# Patient Record
Sex: Male | Born: 1960 | Race: Black or African American | Hispanic: No | Marital: Married | Smoking: Current every day smoker
Health system: Southern US, Community
[De-identification: ages and names within clinical notes are randomized; demographics above are authoritative.]

## PROBLEM LIST (undated history)

## (undated) DIAGNOSIS — E785 Hyperlipidemia, unspecified: Secondary | ICD-10-CM

## (undated) DIAGNOSIS — I10 Essential (primary) hypertension: Secondary | ICD-10-CM

## (undated) DIAGNOSIS — Z720 Tobacco use: Secondary | ICD-10-CM

## (undated) HISTORY — PX: STERIOD INJECTION: SHX5046

---

## 2002-07-16 ENCOUNTER — Emergency Department (HOSPITAL_COMMUNITY): Admission: EM | Admit: 2002-07-16 | Discharge: 2002-07-16 | Payer: Self-pay | Admitting: Emergency Medicine

## 2004-09-12 ENCOUNTER — Ambulatory Visit: Payer: Self-pay | Admitting: Internal Medicine

## 2004-12-08 ENCOUNTER — Ambulatory Visit: Payer: Self-pay | Admitting: Unknown Physician Specialty

## 2005-04-13 ENCOUNTER — Ambulatory Visit: Payer: Self-pay | Admitting: Unknown Physician Specialty

## 2005-06-24 ENCOUNTER — Emergency Department (HOSPITAL_COMMUNITY): Admission: EM | Admit: 2005-06-24 | Discharge: 2005-06-24 | Payer: Self-pay | Admitting: Emergency Medicine

## 2005-08-19 ENCOUNTER — Emergency Department (HOSPITAL_COMMUNITY): Admission: EM | Admit: 2005-08-19 | Discharge: 2005-08-19 | Payer: Self-pay | Admitting: Emergency Medicine

## 2007-02-08 ENCOUNTER — Emergency Department (HOSPITAL_COMMUNITY): Admission: EM | Admit: 2007-02-08 | Discharge: 2007-02-08 | Payer: Self-pay | Admitting: Emergency Medicine

## 2008-11-09 ENCOUNTER — Emergency Department (HOSPITAL_COMMUNITY): Admission: EM | Admit: 2008-11-09 | Discharge: 2008-11-09 | Payer: Self-pay | Admitting: Emergency Medicine

## 2011-01-29 LAB — COMPREHENSIVE METABOLIC PANEL
ALT: 18 U/L (ref 0–53)
Albumin: 4 g/dL (ref 3.5–5.2)
Alkaline Phosphatase: 57 U/L (ref 39–117)
Calcium: 9.3 mg/dL (ref 8.4–10.5)
Potassium: 3.6 mEq/L (ref 3.5–5.1)
Sodium: 137 mEq/L (ref 135–145)
Total Protein: 7.1 g/dL (ref 6.0–8.3)

## 2011-01-29 LAB — PROTIME-INR: INR: 1 (ref 0.00–1.49)

## 2011-01-29 LAB — TROPONIN I
Troponin I: 0.01 ng/mL (ref 0.00–0.06)
Troponin I: <0.01 ng/mL (ref 0.00–0.06)

## 2011-01-29 LAB — POCT CARDIAC MARKERS
Myoglobin, poc: 88.6 ng/mL (ref 12–200)
Troponin i, poc: <0.05 ng/mL (ref 0.00–0.09)

## 2011-01-29 LAB — URINALYSIS, ROUTINE W REFLEX MICROSCOPIC
Glucose, UA: NEGATIVE mg/dL
Ketones, ur: NEGATIVE mg/dL
Nitrite: NEGATIVE
Specific Gravity, Urine: 1.023 (ref 1.005–1.030)
pH: 6 (ref 5.0–8.0)

## 2011-01-29 LAB — DIFFERENTIAL
Basophils Relative: 0 % (ref 0–1)
Eosinophils Absolute: 0.1 10*3/uL (ref 0.0–0.7)
Lymphs Abs: 2.7 10*3/uL (ref 0.7–4.0)
Monocytes Absolute: 1.1 10*3/uL — ABNORMAL HIGH (ref 0.1–1.0)
Monocytes Relative: 8 % (ref 3–12)
Neutro Abs: 10.4 10*3/uL — ABNORMAL HIGH (ref 1.7–7.7)

## 2011-01-29 LAB — RAPID URINE DRUG SCREEN, HOSP PERFORMED
Barbiturates: NOT DETECTED
Benzodiazepines: NOT DETECTED
Cocaine: NOT DETECTED
Opiates: POSITIVE — AB

## 2011-01-29 LAB — CK TOTAL AND CKMB (NOT AT ARMC)
CK, MB: 1.5 ng/mL (ref 0.3–4.0)
Relative Index: 1.5 (ref 0.0–2.5)

## 2011-01-29 LAB — ETHANOL: Alcohol, Ethyl (B): <5 mg/dL (ref 0–10)

## 2011-01-29 LAB — CBC
Hemoglobin: 15 g/dL (ref 13.0–17.0)
RBC: 5.24 MIL/uL (ref 4.22–5.81)
WBC: 14.4 10*3/uL — ABNORMAL HIGH (ref 4.0–10.5)

## 2012-08-03 ENCOUNTER — Encounter (HOSPITAL_COMMUNITY): Payer: Self-pay | Admitting: *Deleted

## 2012-08-03 ENCOUNTER — Emergency Department (HOSPITAL_COMMUNITY): Payer: Medicare Other

## 2012-08-03 ENCOUNTER — Emergency Department (HOSPITAL_COMMUNITY)
Admission: EM | Admit: 2012-08-03 | Discharge: 2012-08-03 | Disposition: A | Discharge status: Home / Self Care | Payer: Medicare Other | Attending: Emergency Medicine | Admitting: Emergency Medicine

## 2012-08-03 DIAGNOSIS — IMO0002 Reserved for concepts with insufficient information to code with codable children: Secondary | ICD-10-CM | POA: Insufficient documentation

## 2012-08-03 DIAGNOSIS — S300XXA Contusion of lower back and pelvis, initial encounter: Secondary | ICD-10-CM

## 2012-08-03 DIAGNOSIS — F17200 Nicotine dependence, unspecified, uncomplicated: Secondary | ICD-10-CM | POA: Insufficient documentation

## 2012-08-03 DIAGNOSIS — S20229A Contusion of unspecified back wall of thorax, initial encounter: Secondary | ICD-10-CM | POA: Insufficient documentation

## 2012-08-03 DIAGNOSIS — S20219A Contusion of unspecified front wall of thorax, initial encounter: Secondary | ICD-10-CM

## 2012-08-03 MED ORDER — IBUPROFEN 800 MG PO TABS: 800.0000 mg | ORAL_TABLET | Freq: Three times a day (TID) | ORAL | Status: DC | PRN

## 2012-08-03 MED ORDER — HYDROCODONE-ACETAMINOPHEN 5-325 MG PO TABS
1.0000 | ORAL_TABLET | Freq: Four times a day (QID) | ORAL | Status: DC | PRN
Start: 2012-08-03 — End: 2013-06-15
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
## 2012-08-17 NOTE — ED Provider Notes (Signed)
Medical screening examination/treatment/procedure(s) were performed by non-physician practitioner and as supervising physician I was immediately available for consultation/collaboration.  Hurman Horn, MD 08/17/12 [phone]

## 2013-06-15 ENCOUNTER — Emergency Department (HOSPITAL_COMMUNITY)
Admission: EM | Admit: 2013-06-15 | Discharge: 2013-06-15 | Disposition: A | Discharge status: Home / Self Care | Payer: Medicare Other | Attending: Emergency Medicine | Admitting: Emergency Medicine

## 2013-06-15 ENCOUNTER — Encounter (HOSPITAL_COMMUNITY): Payer: Self-pay | Admitting: Emergency Medicine

## 2013-06-15 DIAGNOSIS — Z79899 Other long term (current) drug therapy: Secondary | ICD-10-CM | POA: Insufficient documentation

## 2013-06-15 DIAGNOSIS — F17200 Nicotine dependence, unspecified, uncomplicated: Secondary | ICD-10-CM | POA: Insufficient documentation

## 2013-06-15 DIAGNOSIS — M549 Dorsalgia, unspecified: Secondary | ICD-10-CM

## 2013-06-15 DIAGNOSIS — M545 Low back pain, unspecified: Secondary | ICD-10-CM | POA: Insufficient documentation

## 2013-06-15 DIAGNOSIS — I10 Essential (primary) hypertension: Secondary | ICD-10-CM | POA: Insufficient documentation

## 2013-06-15 HISTORY — DX: Essential (primary) hypertension: I10

## 2013-06-15 MED ORDER — TRAMADOL HCL 50 MG PO TABS: 50.0000 mg | ORAL_TABLET | Freq: Four times a day (QID) | ORAL | Status: DC | PRN

## 2013-06-15 NOTE — ED Notes (Signed)
Pt complains of "back pain x 2 weeks after doing yard work" Pt denies fall or trauma to area.

## 2013-06-15 NOTE — ED Provider Notes (Signed)
Medical screening examination/treatment/procedure(s) were performed by non-physician practitioner and as supervising physician I was immediately available for consultation/collaboration.   Junius Argyle, MD 06/15/13 [phone]

## 2013-06-15 NOTE — ED Provider Notes (Signed)
CSN: [id number]     Arrival date & time 06/15/13  1016 History   First MD Initiated Contact with Patient 06/15/13 1033     Chief Complaint  Patient presents with  . Back Pain   (Consider location/radiation/quality/duration/timing/severity/associated sxs/prior Treatment) Patient is a 52 y.o. male presenting with back pain. The history is provided by the patient and medical records.  Back Pain  Patient is to the ED for left lower back pain x2 weeks after doing yard work.  States he was picking up heavy tree limbs and thinks he may have "tweaked" his back the wrong way.  States pain radiates down his left leg. No injury, trauma, or falls.  Denies any numbness or paresthesias of lower extremities. No loss of bowel or bladder function. Patient does have a history of prior work-related low back injury.  Has taken OTC BC powder without relief.  No chest pain, SOB, or abdominal pain.  VS stable on arrival.  Past Medical History  Diagnosis Date  . Hypertension    History reviewed. No pertinent past surgical history. No family history on file. History  Substance Use Topics  . Smoking status: Current Every Day Smoker -- 1.00 packs/day for 20 years    Types: Cigarettes  . Smokeless tobacco: Not on file  . Alcohol Use: No    Review of Systems  Musculoskeletal: Positive for back pain.  All other systems reviewed and are negative.    Allergies  Review of patient's allergies indicates no known allergies.  Home Medications   Current Outpatient Rx  Name  Route  Sig  Dispense  Refill  . Aspirin-Salicylamide-Caffeine (ARTHRITIS STRENGTH BC POWDER PO)   Oral   Take 1 packet by mouth every 6 (six) hours as needed (For back pain.).         Marland Kitchen atorvastatin (LIPITOR) 20 MG tablet   Oral   Take 20 mg by mouth daily.         . hydrochlorothiazide (HYDRODIURIL) 12.5 MG tablet   Oral   Take 12.5 mg by mouth daily.          BP 121/82  Pulse 77  Temp(Src) 98.8 F (37.1 C) (Oral)  Resp  18  SpO2 100%  Physical Exam  Nursing note and vitals reviewed. Constitutional: He is oriented to person, place, and time. He appears well-developed and well-nourished. No distress.  HENT:  Head: Normocephalic and atraumatic.  Eyes: Conjunctivae and EOM are normal.  Neck: Normal range of motion. Neck supple.  Cardiovascular: Normal rate, regular rhythm and normal heart sounds.   Pulmonary/Chest: Effort normal and breath sounds normal. No respiratory distress. He has no wheezes.  Abdominal: Soft. Bowel sounds are normal.  Musculoskeletal: Normal range of motion. He exhibits no edema.       Lumbar back: He exhibits tenderness, pain and spasm. He exhibits normal range of motion, no bony tenderness, no swelling, no edema, no deformity and no laceration.       Back:  Left lower back pain; no midline TTP, step off, gross deformity, or visible signs of trauma; full ROM maintained; strong distal pulses, sensation intact, normal gait  Neurological: He is alert and oriented to person, place, and time.  Skin: Skin is warm and dry. He is not diaphoretic.  Psychiatric: He has a normal mood and affect.    ED Course  Procedures (including critical care time) Labs Review Labs Reviewed - No data to display Imaging Review No results found.  MDM   1.  Back pain    Atraumatic, low back pain. No concern for dissection or cauda equina.  Rx tramadol.  FU with [facility] if no improvement in the next few days. Discussed plan with pt, they agreed.  Return precautions advised.    Garlon Hatchet, PA-C 06/15/13 1217

## 2013-10-06 ENCOUNTER — Other Ambulatory Visit: Payer: Self-pay | Admitting: Orthopedic Surgery

## 2013-10-06 ENCOUNTER — Encounter (HOSPITAL_BASED_OUTPATIENT_CLINIC_OR_DEPARTMENT_OTHER): Payer: Medicare Other | Admitting: Anesthesiology

## 2013-10-06 ENCOUNTER — Encounter (HOSPITAL_BASED_OUTPATIENT_CLINIC_OR_DEPARTMENT_OTHER): Payer: Self-pay | Admitting: *Deleted

## 2013-10-06 ENCOUNTER — Ambulatory Visit (HOSPITAL_BASED_OUTPATIENT_CLINIC_OR_DEPARTMENT_OTHER)
Admission: RE | Admit: 2013-10-06 | Discharge: 2013-10-06 | Disposition: A | Discharge status: Home / Self Care | Payer: Medicare Other | Source: Ambulatory Visit | Attending: Orthopedic Surgery | Admitting: Orthopedic Surgery

## 2013-10-06 ENCOUNTER — Encounter (HOSPITAL_BASED_OUTPATIENT_CLINIC_OR_DEPARTMENT_OTHER)
Admission: RE | Disposition: A | Discharge status: Home / Self Care | Payer: Self-pay | Source: Ambulatory Visit | Attending: Orthopedic Surgery

## 2013-10-06 ENCOUNTER — Ambulatory Visit (HOSPITAL_BASED_OUTPATIENT_CLINIC_OR_DEPARTMENT_OTHER): Payer: Medicare Other | Admitting: Anesthesiology

## 2013-10-06 DIAGNOSIS — L03019 Cellulitis of unspecified finger: Secondary | ICD-10-CM | POA: Insufficient documentation

## 2013-10-06 DIAGNOSIS — L02519 Cutaneous abscess of unspecified hand: Secondary | ICD-10-CM | POA: Insufficient documentation

## 2013-10-06 DIAGNOSIS — F17200 Nicotine dependence, unspecified, uncomplicated: Secondary | ICD-10-CM | POA: Insufficient documentation

## 2013-10-06 DIAGNOSIS — I10 Essential (primary) hypertension: Secondary | ICD-10-CM | POA: Insufficient documentation

## 2013-10-06 HISTORY — PX: INCISION AND DRAINAGE: SHX5863

## 2013-10-06 HISTORY — DX: Hyperlipidemia, unspecified: E78.5

## 2013-10-06 SURGERY — INCISION AND DRAINAGE
Anesthesia: General | Site: Finger | Laterality: Left

## 2013-10-06 MED ORDER — MIDAZOLAM HCL 5 MG/5ML IJ SOLN
INTRAMUSCULAR | Status: DC | PRN
  Administered 2013-10-06: 2 mg via INTRAVENOUS

## 2013-10-06 MED ORDER — FENTANYL CITRATE 0.05 MG/ML IJ SOLN
INTRAMUSCULAR | Status: AC
  Filled 2013-10-06: qty 6

## 2013-10-06 MED ORDER — OXYCODONE HCL 5 MG PO TABS: 5.0000 mg | ORAL_TABLET | Freq: Once | ORAL | Status: DC | PRN

## 2013-10-06 MED ORDER — FENTANYL CITRATE 0.05 MG/ML IJ SOLN
INTRAMUSCULAR | Status: DC | PRN
  Administered 2013-10-06: 100 ug via INTRAVENOUS

## 2013-10-06 MED ORDER — BUPIVACAINE HCL (PF) 0.25 % IJ SOLN
INTRAMUSCULAR | Status: DC | PRN
  Administered 2013-10-06: 10 mL

## 2013-10-06 MED ORDER — TETANUS-DIPHTHERIA TOXOIDS TD 5-2 LFU IM INJ
0.5000 mL | INJECTION | Freq: Once | INTRAMUSCULAR | Status: AC
  Administered 2013-10-06: 0.5 mL via INTRAMUSCULAR

## 2013-10-06 MED ORDER — PROPOFOL 10 MG/ML IV BOLUS
INTRAVENOUS | Status: DC | PRN
  Administered 2013-10-06: 200 mg via INTRAVENOUS

## 2013-10-06 MED ORDER — MIDAZOLAM HCL 2 MG/2ML IJ SOLN: 1.0000 mg | INTRAMUSCULAR | Status: DC | PRN

## 2013-10-06 MED ORDER — SULFAMETHOXAZOLE-TRIMETHOPRIM 800-160 MG PO TABS: 1.0000 | ORAL_TABLET | Freq: Two times a day (BID) | ORAL | Status: DC

## 2013-10-06 MED ORDER — CEFAZOLIN SODIUM-DEXTROSE 2-3 GM-% IV SOLR
INTRAVENOUS | Status: AC
  Filled 2013-10-06: qty 50

## 2013-10-06 MED ORDER — LACTATED RINGERS IV SOLN
INTRAVENOUS | Status: DC
  Administered 2013-10-06: 12:00:00 via INTRAVENOUS

## 2013-10-06 MED ORDER — ONDANSETRON HCL 4 MG/2ML IJ SOLN: 4.0000 mg | Freq: Once | INTRAMUSCULAR | Status: DC | PRN

## 2013-10-06 MED ORDER — HYDROCODONE-ACETAMINOPHEN 5-325 MG PO TABS: ORAL_TABLET | ORAL | Status: DC

## 2013-10-06 MED ORDER — ONDANSETRON HCL 4 MG/2ML IJ SOLN
INTRAMUSCULAR | Status: DC | PRN
  Administered 2013-10-06: 4 mg via INTRAVENOUS

## 2013-10-06 MED ORDER — LIDOCAINE HCL (CARDIAC) 20 MG/ML IV SOLN
INTRAVENOUS | Status: DC | PRN
  Administered 2013-10-06: 100 mg via INTRAVENOUS

## 2013-10-06 MED ORDER — FENTANYL CITRATE 0.05 MG/ML IJ SOLN: 50.0000 ug | INTRAMUSCULAR | Status: DC | PRN

## 2013-10-06 MED ORDER — CEFAZOLIN SODIUM-DEXTROSE 2-3 GM-% IV SOLR
INTRAVENOUS | Status: DC | PRN
  Administered 2013-10-06: 2 g via INTRAVENOUS

## 2013-10-06 MED ORDER — BUPIVACAINE HCL (PF) 0.25 % IJ SOLN
INTRAMUSCULAR | Status: AC
  Filled 2013-10-06: qty 30

## 2013-10-06 MED ORDER — MEPERIDINE HCL 25 MG/ML IJ SOLN: 6.2500 mg | INTRAMUSCULAR | Status: DC | PRN

## 2013-10-06 MED ORDER — MIDAZOLAM HCL 2 MG/2ML IJ SOLN
INTRAMUSCULAR | Status: AC
  Filled 2013-10-06: qty 2

## 2013-10-06 MED ORDER — DEXAMETHASONE SODIUM PHOSPHATE 4 MG/ML IJ SOLN
INTRAMUSCULAR | Status: DC | PRN
  Administered 2013-10-06: 10 mg via INTRAVENOUS

## 2013-10-06 MED ORDER — OXYCODONE HCL 5 MG/5ML PO SOLN: 5.0000 mg | Freq: Once | ORAL | Status: DC | PRN

## 2013-10-06 MED ORDER — HYDROMORPHONE HCL PF 1 MG/ML IJ SOLN: 0.2500 mg | INTRAMUSCULAR | Status: DC | PRN

## 2013-10-06 SURGICAL SUPPLY — 50 items
BAG DECANTER FOR FLEXI CONT (MISCELLANEOUS) IMPLANT
BANDAGE ELASTIC 3 VELCRO ST LF (GAUZE/BANDAGES/DRESSINGS) IMPLANT
BANDAGE GAUZE STRT 1 STR LF (GAUZE/BANDAGES/DRESSINGS) IMPLANT
BLADE MINI RND TIP GREEN BEAV (BLADE) IMPLANT
BLADE SURG 15 STRL LF DISP TIS (BLADE) ×2 IMPLANT
BLADE SURG 15 STRL SS (BLADE) ×2
BNDG CMPR 9X4 STRL LF SNTH (GAUZE/BANDAGES/DRESSINGS) ×1
BNDG CMPR MD 5X2 ELC HKLP STRL (GAUZE/BANDAGES/DRESSINGS)
BNDG COHESIVE 1X5 TAN STRL LF (GAUZE/BANDAGES/DRESSINGS) ×1 IMPLANT
BNDG ELASTIC 2 VLCR STRL LF (GAUZE/BANDAGES/DRESSINGS) IMPLANT
BNDG ESMARK 4X9 LF (GAUZE/BANDAGES/DRESSINGS) ×1 IMPLANT
BNDG GAUZE ELAST 4 BULKY (GAUZE/BANDAGES/DRESSINGS) IMPLANT
CHLORAPREP W/TINT 26ML (MISCELLANEOUS) ×2 IMPLANT
CORDS BIPOLAR (ELECTRODE) ×2 IMPLANT
COVER MAYO STAND STRL (DRAPES) ×2 IMPLANT
COVER TABLE BACK 60X90 (DRAPES) ×2 IMPLANT
CUFF TOURNIQUET SINGLE 18IN (TOURNIQUET CUFF) ×2 IMPLANT
DRAPE EXTREMITY T 121X128X90 (DRAPE) ×2 IMPLANT
DRAPE SURG 17X23 STRL (DRAPES) ×2 IMPLANT
GAUZE PACKING IODOFORM 1/4X15 (GAUZE/BANDAGES/DRESSINGS) ×1 IMPLANT
GAUZE XEROFORM 1X8 LF (GAUZE/BANDAGES/DRESSINGS) ×2 IMPLANT
GLOVE BIO SURGEON STRL SZ7.5 (GLOVE) ×2 IMPLANT
GLOVE BIOGEL PI IND STRL 8 (GLOVE) ×1 IMPLANT
GLOVE BIOGEL PI INDICATOR 8 (GLOVE) ×1
GLOVE ECLIPSE 6.5 STRL STRAW (GLOVE) ×1 IMPLANT
GOWN BRE IMP PREV XXLGXLNG (GOWN DISPOSABLE) ×2 IMPLANT
GOWN PREVENTION PLUS XLARGE (GOWN DISPOSABLE) ×2 IMPLANT
LOOP VESSEL MAXI BLUE (MISCELLANEOUS) IMPLANT
NDL HYPO 25X1 1.5 SAFETY (NEEDLE) IMPLANT
NEEDLE HYPO 25X1 1.5 SAFETY (NEEDLE) ×2 IMPLANT
NS IRRIG 1000ML POUR BTL (IV SOLUTION) ×2 IMPLANT
PACK BASIN DAY SURGERY FS (CUSTOM PROCEDURE TRAY) ×2 IMPLANT
PAD CAST 3X4 CTTN HI CHSV (CAST SUPPLIES) IMPLANT
PADDING CAST ABS 4INX4YD NS (CAST SUPPLIES)
PADDING CAST ABS COTTON 4X4 ST (CAST SUPPLIES) ×1 IMPLANT
PADDING CAST COTTON 3X4 STRL (CAST SUPPLIES)
SPLINT FNGR PLAIN END 5/8X3.25 (CAST SUPPLIES) IMPLANT
SPLINT PLASTALUME 3 1/4 (CAST SUPPLIES) ×2
SPLINT PLASTER CAST XFAST 3X15 (CAST SUPPLIES) IMPLANT
SPLINT PLASTER XTRA FASTSET 3X (CAST SUPPLIES)
SPONGE GAUZE 4X4 12PLY (GAUZE/BANDAGES/DRESSINGS) ×2 IMPLANT
STOCKINETTE 4X48 STRL (DRAPES) ×2 IMPLANT
SUT ETHILON 4 0 PS 2 18 (SUTURE) IMPLANT
SWAB COLLECTION DEVICE MRSA (MISCELLANEOUS) ×1 IMPLANT
SYR BULB 3OZ (MISCELLANEOUS) ×2 IMPLANT
SYR CONTROL 10ML LL (SYRINGE) ×1 IMPLANT
TOWEL OR 17X24 6PK STRL BLUE (TOWEL DISPOSABLE) ×3 IMPLANT
TUBE ANAEROBIC SPECIMEN COL (MISCELLANEOUS) ×1 IMPLANT
TUBE FEEDING 5FR 15 INCH (TUBING) IMPLANT
UNDERPAD 30X30 INCONTINENT (UNDERPADS AND DIAPERS) ×2 IMPLANT

## 2013-10-06 NOTE — Brief Op Note (Signed)
10/06/2013  12:21 PM  PATIENT:  Damon [name]  52 y.o. male  PRE-OPERATIVE DIAGNOSIS:  left long finger infection   POST-OPERATIVE DIAGNOSIS:  left long finger infection   PROCEDURE:  Procedure(s): INCISION AND DRAINAGE LEFT LONG FINGER (Left)  SURGEON:  Surgeon(s) and Role:    * Tami Ribas, MD - Primary  PHYSICIAN ASSISTANT:   ASSISTANTS: none   ANESTHESIA:   general  EBL:     BLOOD ADMINISTERED:none  DRAINS: iodoform packing  LOCAL MEDICATIONS USED:  MARCAINE     SPECIMEN:  Source of Specimen:  left long finger  DISPOSITION OF SPECIMEN:  micro  COUNTS:  YES  TOURNIQUET:   Total Tourniquet Time Documented: Upper Arm (Left) - 14 minutes Total: Upper Arm (Left) - 14 minutes   DICTATION: .Other Dictation: Dictation Number [id number]  PLAN OF CARE: Discharge to home after PACU  PATIENT DISPOSITION:  PACU - hemodynamically stable.

## 2013-10-06 NOTE — Op Note (Signed)
[id number]

## 2013-10-06 NOTE — Anesthesia Preprocedure Evaluation (Signed)
Anesthesia Evaluation  Patient identified by MRN, date of birth, ID band Patient awake    Reviewed: Allergy & Precautions, H&P , NPO status , Patient's Chart, lab work & pertinent test results  Airway Mallampati: I TM Distance: >3 FB Neck ROM: Full    Dental   Pulmonary Current Smoker,          Cardiovascular hypertension, Pt. on medications     Neuro/Psych    GI/Hepatic   Endo/Other    Renal/GU      Musculoskeletal   Abdominal   Peds  Hematology   Anesthesia Other Findings   Reproductive/Obstetrics                           Anesthesia Physical Anesthesia Plan  ASA: II  Anesthesia Plan: General   Post-op Pain Management:    Induction: Intravenous  Airway Management Planned: LMA  Additional Equipment:   Intra-op Plan:   Post-operative Plan: Extubation in OR  Informed Consent: I have reviewed the patients History and Physical, chart, labs and discussed the procedure including the risks, benefits and alternatives for the proposed anesthesia with the patient or authorized representative who has indicated his/her understanding and acceptance.     Plan Discussed with: CRNA and Surgeon  Anesthesia Plan Comments:         Anesthesia Quick Evaluation  

## 2013-10-06 NOTE — Anesthesia Postprocedure Evaluation (Signed)
Anesthesia Post Note  Patient: Damon [name]  Procedure(s) Performed: Procedure(s) (LRB): INCISION AND DRAINAGE LEFT LONG FINGER (Left)  Anesthesia type: general  Patient location: PACU  Post pain: Pain level controlled  Post assessment: Patient's Cardiovascular Status Stable  Last Vitals:  Filed Vitals:   10/06/13 1358  BP: 160/88  Pulse: 58  Temp: 36.6 C  Resp: 16    Post vital signs: Reviewed and stable  Level of consciousness: sedated  Complications: No apparent anesthesia complications

## 2013-10-06 NOTE — H&P (Signed)
  Damon [name] is an 52 y.o. male.   Chief Complaint: left long finger finger infection HPI: 52 yo rhd male states that 2 weeks ago while at home he cut his left long finger on a piece of metal.  Has had recent pain and swelling in finger at level of pip joint.  No fevers, chills, night sweats.    Past Medical History  Diagnosis Date  . Hypertension     No past surgical history on file.  No family history on file. Social History:  reports that he has been smoking Cigarettes.  He has a 20 pack-year smoking history. He does not have any smokeless tobacco history on file. He reports that he does not drink alcohol or use illicit drugs.  Allergies: No Known Allergies  No prescriptions prior to admission    No results found for this or any previous visit (from the past 48 hour(s)).  No results found.   A comprehensive review of systems was negative.  There were no vitals taken for this visit.  General appearance: alert, cooperative and appears stated age Head: Normocephalic, without obvious abnormality, atraumatic Neck: supple, symmetrical, trachea midline Resp: clear to auscultation bilaterally Cardio: regular rate and rhythm GI: non tender Extremities: intact sensation and capillary refill all digits.  +epl/fpl/io.  swelling dorsally at pip of left long finger.  ttp.  no proximal streaks.  scabbed wound dorsal pip. Pulses: 2+ and symmetric Skin: Skin color, texture, turgor normal. No rashes or lesions Neurologic: Grossly normal Incision/Wound: As above  Assessment/Plan Left long finger pip joint infection.  Recommend OR for I&D.  Risks, benefits, and alternatives of surgery were discussed and the patient agrees with the plan of care.   Kenosha Doster R 10/06/2013, 10:34 AM

## 2013-10-06 NOTE — Transfer of Care (Signed)
Immediate Anesthesia Transfer of Care Note  Patient: Damon [name]  Procedure(s) Performed: Procedure(s): INCISION AND DRAINAGE LEFT LONG FINGER (Left)  Patient Location: PACU  Anesthesia Type:General  Level of Consciousness: awake, alert  and oriented  Airway & Oxygen Therapy: Patient Spontanous Breathing and Patient connected to face mask oxygen  Post-op Assessment: Report given to PACU RN and Post -op Vital signs reviewed and stable  Post vital signs: Reviewed and stable  Complications: No apparent anesthesia complications

## 2013-10-06 NOTE — Anesthesia Procedure Notes (Signed)
Procedure Name: LMA Insertion Date/Time: 10/06/2013 11:55 AM Performed by: Zenia Resides D Pre-anesthesia Checklist: Patient identified, Emergency Drugs available, Suction available and Patient being monitored Patient Re-evaluated:Patient Re-evaluated prior to inductionOxygen Delivery Method: Circle System Utilized Preoxygenation: Pre-oxygenation with 100% oxygen Intubation Type: IV induction Ventilation: Mask ventilation without difficulty LMA: LMA inserted LMA Size: 5.0 Number of attempts: 1 Airway Equipment and Method: bite block Placement Confirmation: positive ETCO2 Tube secured with: Tape Dental Injury: Teeth and Oropharynx as per pre-operative assessment

## 2013-10-07 NOTE — Op Note (Signed)
NAMEDAVIEL, ALLEGRETTO NO.:  [id number]  MEDICAL RECORD NO.:  [id number]  LOCATION:                               FACILITY:  [facility]  PHYSICIAN:  Betha Loa, MD        DATE OF BIRTH:  08-28-1961  DATE OF PROCEDURE:  10/06/2013 DATE OF DISCHARGE:  10/06/2013                              OPERATIVE REPORT   POSTOPERATIVE DIAGNOSIS:  Left long finger infection.  POSTOPERATIVE DIAGNOSIS:  Left long finger abscess.  SURGEON:  Betha Loa, MD  ASSISTANT:  None.  ANESTHESIA:  General.  IV FLUIDS:  Per anesthesia flow sheet.  ESTIMATED BLOOD LOSS:  Minimal.  COMPLICATIONS:  None.  SPECIMENS:  Cultures from left long finger to Micro.  TOURNIQUET TIME:  14 minutes.  DISPOSITION:  Stable to PACU.  INDICATIONS:  Mr. Faul is a 52 year old right-hand dominant male who states approximately 2 weeks ago, he lacerated his left long finger on piece of metal.  He did not seek medical attention.  Over the past few days, he has noticed increased swelling and pain in the finger.  He presented to the office this morning seeking care.  On examination, he had a fluctuant area on the dorsum of the long finger in the area of the PIP joint.  This was tender to palpation.  I recommended Mr. Schmuck going to the operating room for incision and drainage of abscess. Risks, benefits, and alternatives of surgery were discussed including risk of blood loss; infection; damage to nerves, vessels, tendons, ligaments, bone; failure of surgery; need for additional surgery; complications with wound healing; continued pain; continued infection; need for repeat irrigation and debridement.  He voiced understanding of these and elected to proceed.  OPERATIVE COURSE:  After being identified preoperatively by myself, the patient and I agreed upon the procedure and site of procedure.  Surgical site was marked.  The risks, benefits, and alternatives of surgery were reviewed and he wished to  proceed.  Surgical consent had been signed. His tetanus was updated.  He was transferred to the operating room and placed on the operating room table in supine position with left upper extremity on an armboard.  General anesthesia was induced by anesthesiologist.  Left upper extremity was prepped and draped in normal sterile orthopedic fashion.  Surgical pause was performed between the surgeons, anesthesia, and operating room staff, and all were in agreement as to the patient, procedure, and site of procedure. Tourniquet at the proximal aspect of the extremity was inflated to 250 mmHg after gravity exsanguination of the hand and Esmarch exsanguination of the forearm.  Incision was made including the traumatic portion of the wound on the dorsum of the finger.  There was gross purulence. Cultures were taken for aerobes and anaerobes.  The wound was extended. Superficial layers of skin had delaminated and these were sharply removed with the scissors.  The extensor tendon was identified.  The extensor hood was intact.  There was no tract down to the PIP joint. Rongeurs were used to clear necrotic fat.  The wound was copiously irrigated with sterile saline.  It was then packed open with 0.25-inch iodoform gauze.  A digital block  was performed with 10 mL of 0.25% plain Marcaine to aid in postoperative analgesia.  The wound was then dressed with sterile Xeroform, 4x4 and wrapped with a Coban dressing lightly. An AlumaFoam splint was placed and wrapped lightly with a Coban dressing.  Tourniquet was deflated at 14 minutes.  Fingertips were pink with brisk capillary refill after deflation of tourniquet.  Operative drapes were broken down and the patient was awoken from anesthesia safely.  He was transferred back to the stretcher and taken to PACU in stable condition.  I will see him back in the office the beginning of next week for postoperative followup.  I will give him Norco 5/325 1-2 p.o. q.6  hours p.r.n. pain, dispensed #40, and Bactrim DS 1 p.o. b.i.d. x7 days.     Betha Loa, MD     KK/MEDQ  D:  10/06/2013  T:  10/06/2013  Job:  [id number]

## 2013-10-09 LAB — WOUND CULTURE

## 2013-10-11 LAB — ANAEROBIC CULTURE

## 2013-10-12 ENCOUNTER — Encounter (HOSPITAL_BASED_OUTPATIENT_CLINIC_OR_DEPARTMENT_OTHER): Payer: Self-pay | Admitting: Orthopedic Surgery

## 2014-01-10 ENCOUNTER — Emergency Department (HOSPITAL_COMMUNITY)
Admission: EM | Admit: 2014-01-10 | Discharge: 2014-01-10 | Disposition: A | Discharge status: Home / Self Care | Payer: Medicare Other | Attending: Emergency Medicine | Admitting: Emergency Medicine

## 2014-01-10 ENCOUNTER — Encounter (HOSPITAL_COMMUNITY): Payer: Self-pay | Admitting: Emergency Medicine

## 2014-01-10 DIAGNOSIS — Y929 Unspecified place or not applicable: Secondary | ICD-10-CM | POA: Insufficient documentation

## 2014-01-10 DIAGNOSIS — Z792 Long term (current) use of antibiotics: Secondary | ICD-10-CM | POA: Insufficient documentation

## 2014-01-10 DIAGNOSIS — M5430 Sciatica, unspecified side: Secondary | ICD-10-CM

## 2014-01-10 DIAGNOSIS — Z79899 Other long term (current) drug therapy: Secondary | ICD-10-CM | POA: Insufficient documentation

## 2014-01-10 DIAGNOSIS — I10 Essential (primary) hypertension: Secondary | ICD-10-CM | POA: Insufficient documentation

## 2014-01-10 DIAGNOSIS — E785 Hyperlipidemia, unspecified: Secondary | ICD-10-CM | POA: Insufficient documentation

## 2014-01-10 DIAGNOSIS — Y939 Activity, unspecified: Secondary | ICD-10-CM | POA: Insufficient documentation

## 2014-01-10 DIAGNOSIS — W010XXA Fall on same level from slipping, tripping and stumbling without subsequent striking against object, initial encounter: Secondary | ICD-10-CM | POA: Insufficient documentation

## 2014-01-10 DIAGNOSIS — F17200 Nicotine dependence, unspecified, uncomplicated: Secondary | ICD-10-CM | POA: Insufficient documentation

## 2014-01-10 MED ORDER — PREDNISONE 20 MG PO TABS: 40.0000 mg | ORAL_TABLET | Freq: Every day | ORAL | Status: DC

## 2014-01-10 MED ORDER — OXYCODONE-ACETAMINOPHEN 5-325 MG PO TABS
2.0000 | ORAL_TABLET | Freq: Once | ORAL | Status: AC
  Administered 2014-01-10: 2 via ORAL
  Filled 2014-01-10: qty 2

## 2014-01-10 MED ORDER — HYDROCODONE-ACETAMINOPHEN 5-325 MG PO TABS: 1.0000 | ORAL_TABLET | Freq: Four times a day (QID) | ORAL | Status: DC | PRN

## 2014-01-10 NOTE — Discharge Instructions (Signed)
Back Exercises Back exercises help treat and prevent back injuries. The goal of back exercises is to increase the strength of your abdominal and back muscles and the flexibility of your back. These exercises should be started when you no longer have back pain. Back exercises include:  Pelvic Tilt. Lie on your back with your knees bent. Tilt your pelvis until the lower part of your back is against the floor. Hold this position 5 to 10 sec and repeat 5 to 10 times.  Knee to Chest. Pull first 1 knee up against your chest and hold for 20 to 30 seconds, repeat this with the other knee, and then both knees. This may be done with the other leg straight or bent, whichever feels better.  Sit-Ups or Curl-Ups. Bend your knees 90 degrees. Start with tilting your pelvis, and do a partial, slow sit-up, lifting your trunk only 30 to 45 degrees off the floor. Take at least 2 to 3 seconds for each sit-up. Do not do sit-ups with your knees out straight. If partial sit-ups are difficult, simply do the above but with only tightening your abdominal muscles and holding it as directed.  Hip-Lift. Lie on your back with your knees flexed 90 degrees. Push down with your feet and shoulders as you raise your hips a couple inches off the floor; hold for 10 seconds, repeat 5 to 10 times.  Back arches. Lie on your stomach, propping yourself up on bent elbows. Slowly press on your hands, causing an arch in your low back. Repeat 3 to 5 times. Any initial stiffness and discomfort should lessen with repetition over time.  Shoulder-Lifts. Lie face down with arms beside your body. Keep hips and torso pressed to floor as you slowly lift your head and shoulders off the floor. Do not overdo your exercises, especially in the beginning. Exercises may cause you some mild back discomfort which lasts for a few minutes; however, if the pain is more severe, or lasts for more than 15 minutes, do not continue exercises until you see your caregiver.  Improvement with exercise therapy for back problems is slow.  See your caregivers for assistance with developing a proper back exercise program. Document Released: 11/08/2004 Document Revised: 12/24/2011 Document Reviewed: 08/02/2011 [employer] Patient Information 2014 [employer].   Sciatica Sciatica is pain, weakness, numbness, or tingling along the path of the sciatic nerve. The nerve starts in the lower back and runs down the back of each leg. The nerve controls the muscles in the lower leg and in the back of the knee, while also providing sensation to the back of the thigh, lower leg, and the sole of your foot. Sciatica is a symptom of another medical condition. For instance, nerve damage or certain conditions, such as a herniated disk or bone spur on the spine, pinch or put pressure on the sciatic nerve. This causes the pain, weakness, or other sensations normally associated with sciatica. Generally, sciatica only affects one side of the body. CAUSES   Herniated or slipped disc.  Degenerative disk disease.  A pain disorder involving the narrow muscle in the buttocks (piriformis syndrome).  Pelvic injury or fracture.  Pregnancy.  Tumor (rare). SYMPTOMS  Symptoms can vary from mild to very severe. The symptoms usually travel from the low back to the buttocks and down the back of the leg. Symptoms can include:  Mild tingling or dull aches in the lower back, leg, or hip.  Numbness in the back of the calf or sole   of the foot.  Burning sensations in the lower back, leg, or hip.  Sharp pains in the lower back, leg, or hip.  Leg weakness.  Severe back pain inhibiting movement. These symptoms may get worse with coughing, sneezing, laughing, or prolonged sitting or standing. Also, being overweight may worsen symptoms. DIAGNOSIS  Your caregiver will perform a physical exam to look for common symptoms of sciatica. He or she may ask you to do certain movements or activities that would  trigger sciatic nerve pain. Other tests may be performed to find the cause of the sciatica. These may include:  Blood tests.  X-rays.  Imaging tests, such as an MRI or CT scan. TREATMENT  Treatment is directed at the cause of the sciatic pain. Sometimes, treatment is not necessary and the pain and discomfort goes away on its own. If treatment is needed, your caregiver may suggest:  Over-the-counter medicines to relieve pain.  Prescription medicines, such as anti-inflammatory medicine, muscle relaxants, or narcotics.  Applying heat or ice to the painful area.  Steroid injections to lessen pain, irritation, and inflammation around the nerve.  Reducing activity during periods of pain.  Exercising and stretching to strengthen your abdomen and improve flexibility of your spine. Your caregiver may suggest losing weight if the extra weight makes the back pain worse.  Physical therapy.  Surgery to eliminate what is pressing or pinching the nerve, such as a bone spur or part of a herniated disk. HOME CARE INSTRUCTIONS   Only take over-the-counter or prescription medicines for pain or discomfort as directed by your caregiver.  Apply ice to the affected area for 20 minutes, 3 4 times a day for the first 48 72 hours. Then try heat in the same way.  Exercise, stretch, or perform your usual activities if these do not aggravate your pain.  Attend physical therapy sessions as directed by your caregiver.  Keep all follow-up appointments as directed by your caregiver.  Do not wear high heels or shoes that do not provide proper support.  Check your mattress to see if it is too soft. A firm mattress may lessen your pain and discomfort. SEEK IMMEDIATE MEDICAL CARE IF:   You lose control of your bowel or bladder (incontinence).  You have increasing weakness in the lower back, pelvis, buttocks, or legs.  You have redness or swelling of your back.  You have a burning sensation when you  urinate.  You have pain that gets worse when you lie down or awakens you at night.  Your pain is worse than you have experienced in the past.  Your pain is lasting longer than 4 weeks.  You are suddenly losing weight without reason. MAKE SURE YOU:  Understand these instructions.  Will watch your condition.  Will get help right away if you are not doing well or get worse. Document Released: 09/25/2001 Document Revised: 04/01/2012 Document Reviewed: 02/10/2012 [employer] Patient Information 2014 [employer].  

## 2014-01-10 NOTE — ED Provider Notes (Signed)
CSN: [id number]     Arrival date & time 01/10/14  1738 History   First MD Initiated Contact with Patient 01/10/14 1934     Chief Complaint  Patient presents with  . Back Pain     (Consider location/radiation/quality/duration/timing/severity/associated sxs/prior Treatment) HPI Comments: Patient presents emergency department with chief complaint of back pain. He states that he slipped and fell about a week ago. He states that since the fall, he has had left-sided back pain that radiates to his left leg. He has a history of sciatica. He states that he is tried taking OTC pain medicine with no relief. His symptoms are aggravated with movement. Denies any bowel or bladder incontinence, saddle anesthesia, or fevers or chills.  The history is provided by the patient. No language interpreter was used.    Past Medical History  Diagnosis Date  . Hypertension   . Elevated lipids    Past Surgical History  Procedure Laterality Date  . No past surgeries    . Steriod injection  2005back  . Incision and drainage Left 10/06/2013    Procedure: INCISION AND DRAINAGE LEFT LONG FINGER;  Surgeon: Tami Ribas, MD;  Location: [facility];  Service: Orthopedics;  Laterality: Left;   History reviewed. No pertinent family history. History  Substance Use Topics  . Smoking status: Current Every Day Smoker -- 1.00 packs/day for 20 years    Types: Cigarettes  . Smokeless tobacco: Never Used  . Alcohol Use: No    Review of Systems  Constitutional: Negative for fever and chills.  Gastrointestinal:       No bowel incontinence  Genitourinary:       No urinary incontinence  Musculoskeletal: Positive for arthralgias, back pain and myalgias.  Neurological:       No saddle anesthesia      Allergies  Review of patient's allergies indicates no known allergies.  Home Medications   Current Outpatient Rx  Name  Route  Sig  Dispense  Refill  . atorvastatin (LIPITOR) 20 MG tablet   Oral  Take 20 mg by mouth daily.         . carisoprodol (SOMA) 350 MG tablet   Oral   Take 350 mg by mouth 4 (four) times daily as needed for muscle spasms.         . hydrochlorothiazide (HYDRODIURIL) 12.5 MG tablet   Oral   Take 12.5 mg by mouth daily.         Marland Kitchen HYDROcodone-acetaminophen (NORCO) 5-325 MG per tablet      1-2 tabs po q6 hours prn pain   40 tablet   0   . HYDROcodone-acetaminophen (NORCO/VICODIN) 5-325 MG per tablet   Oral   Take 1-2 tablets by mouth every 6 (six) hours as needed.   13 tablet   0   . predniSONE (DELTASONE) 20 MG tablet   Oral   Take 2 tablets (40 mg total) by mouth daily.   10 tablet   0   . sulfamethoxazole-trimethoprim (BACTRIM DS) 800-160 MG per tablet   Oral   Take 1 tablet by mouth 2 (two) times daily.   14 tablet   0   . traMADol (ULTRAM) 50 MG tablet   Oral   Take 1 tablet (50 mg total) by mouth every 6 (six) hours as needed for pain.   15 tablet   0    BP 137/91  Pulse 84  Temp(Src) 97.8 F (36.6 C) (Oral)  Resp 14  SpO2  100% Physical Exam  Nursing note and vitals reviewed. Constitutional: He is oriented to person, place, and time. He appears well-developed and well-nourished. No distress.  HENT:  Head: Normocephalic and atraumatic.  Eyes: Conjunctivae and EOM are normal. Right eye exhibits no discharge. Left eye exhibits no discharge. No scleral icterus.  Neck: Normal range of motion. Neck supple. No tracheal deviation present.  Cardiovascular: Normal rate, regular rhythm and normal heart sounds.  Exam reveals no gallop and no friction rub.   No murmur heard. Pulmonary/Chest: Effort normal and breath sounds normal. No respiratory distress. He has no wheezes.  Abdominal: Soft. He exhibits no distension. There is no tenderness.  Musculoskeletal: Normal range of motion.  Left-sided lumbar paraspinal muscles tender to palpation, no bony tenderness, step-offs, or gross abnormality or deformity of spine, patient is able to  ambulate, moves all extremities  Bilateral great toe extension intact Bilateral plantar/dorsiflexion intact  Neurological: He is alert and oriented to person, place, and time. He has normal reflexes.  Sensation and strength intact bilaterally Symmetrical reflexes  Skin: Skin is warm. He is not diaphoretic.  Psychiatric: He has a normal mood and affect. His behavior is normal. Judgment and thought content normal.    ED Course  Procedures (including critical care time) Labs Review Labs Reviewed - No data to display Imaging Review No results found.   EKG Interpretation None      MDM   Final diagnoses:  Sciatica    Patient with back pain.  No neurological deficits and normal neuro exam.  Patient is ambulatory.  No loss of bowel or bladder control.  Doubt cauda equina.  Denies fever,  doubt epidural abscess or other lesion. Recommend back exercises, stretching, RICE, and will treat with a short course of norco and prednisone.    Roxy Horsemanobert Akeia Perot, PA-C 01/10/14 1951

## 2014-01-10 NOTE — ED Notes (Signed)
Patient reports that he is having pain to his left back and down into his left leg. Denies any numbness reports "burning in his leg"

## 2014-01-10 NOTE — ED Provider Notes (Signed)
Medical screening examination/treatment/procedure(s) were performed by non-physician practitioner and as supervising physician I was immediately available for consultation/collaboration.   EKG Interpretation None        Jancarlo Biermann, MD 01/10/14 2309 

## 2015-12-02 ENCOUNTER — Encounter (HOSPITAL_COMMUNITY): Payer: Self-pay | Admitting: *Deleted

## 2015-12-02 ENCOUNTER — Emergency Department (HOSPITAL_COMMUNITY)
Admission: EM | Admit: 2015-12-02 | Discharge: 2015-12-02 | Disposition: A | Discharge status: Home / Self Care | Payer: Medicare Other | Attending: Emergency Medicine | Admitting: Emergency Medicine

## 2015-12-02 ENCOUNTER — Emergency Department (HOSPITAL_COMMUNITY): Payer: Medicare Other

## 2015-12-02 DIAGNOSIS — I10 Essential (primary) hypertension: Secondary | ICD-10-CM | POA: Insufficient documentation

## 2015-12-02 DIAGNOSIS — R1084 Generalized abdominal pain: Secondary | ICD-10-CM

## 2015-12-02 DIAGNOSIS — K859 Acute pancreatitis without necrosis or infection, unspecified: Secondary | ICD-10-CM | POA: Diagnosis not present

## 2015-12-02 DIAGNOSIS — F17210 Nicotine dependence, cigarettes, uncomplicated: Secondary | ICD-10-CM | POA: Diagnosis not present

## 2015-12-02 DIAGNOSIS — Z8639 Personal history of other endocrine, nutritional and metabolic disease: Secondary | ICD-10-CM | POA: Diagnosis not present

## 2015-12-02 DIAGNOSIS — R109 Unspecified abdominal pain: Secondary | ICD-10-CM | POA: Diagnosis present

## 2015-12-02 LAB — URINALYSIS, ROUTINE W REFLEX MICROSCOPIC
Bilirubin Urine: NEGATIVE
GLUCOSE, UA: NEGATIVE mg/dL
Ketones, ur: NEGATIVE mg/dL
Nitrite: NEGATIVE
Protein, ur: NEGATIVE mg/dL
Specific Gravity, Urine: 1.028 (ref 1.005–1.030)
pH: 6.5 (ref 5.0–8.0)

## 2015-12-02 LAB — CBC
HCT: 43.3 % (ref 39.0–52.0)
Hemoglobin: 14.4 g/dL (ref 13.0–17.0)
MCH: 28.6 pg (ref 26.0–34.0)
MCHC: 33.3 g/dL (ref 30.0–36.0)
MCV: 86.1 fL (ref 78.0–100.0)
Platelets: 276 10*3/uL (ref 150–400)
RBC: 5.03 MIL/uL (ref 4.22–5.81)
RDW: 13.9 % (ref 11.5–15.5)
WBC: 11.8 10*3/uL — ABNORMAL HIGH (ref 4.0–10.5)

## 2015-12-02 LAB — COMPREHENSIVE METABOLIC PANEL
ALK PHOS: 62 U/L (ref 38–126)
ALT: 15 U/L — AB (ref 17–63)
AST: 14 U/L — AB (ref 15–41)
Albumin: 4.1 g/dL (ref 3.5–5.0)
Anion gap: 8 (ref 5–15)
BUN: 20 mg/dL (ref 6–20)
CALCIUM: 9.4 mg/dL (ref 8.9–10.3)
CHLORIDE: 105 mmol/L (ref 101–111)
CO2: 24 mmol/L (ref 22–32)
CREATININE: 1.1 mg/dL (ref 0.61–1.24)
GFR calc Af Amer: >60 mL/min (ref >60)
GFR calc non Af Amer: >60 mL/min (ref >60)
Glucose, Bld: 96 mg/dL (ref 65–99)
Potassium: 4 mmol/L (ref 3.5–5.1)
SODIUM: 137 mmol/L (ref 135–145)
Total Bilirubin: 0.6 mg/dL (ref 0.3–1.2)
Total Protein: 7.6 g/dL (ref 6.5–8.1)

## 2015-12-02 LAB — URINE MICROSCOPIC-ADD ON

## 2015-12-02 LAB — LIPASE, BLOOD: Lipase: 90 U/L — ABNORMAL HIGH (ref 11–51)

## 2015-12-02 MED ORDER — DOCUSATE SODIUM 100 MG PO CAPS: 100.0000 mg | ORAL_CAPSULE | Freq: Two times a day (BID) | ORAL | Status: DC

## 2015-12-02 MED ORDER — HYDROMORPHONE HCL 1 MG/ML IJ SOLN
1.0000 mg | Freq: Once | INTRAMUSCULAR | Status: AC
  Administered 2015-12-02: 1 mg via INTRAVENOUS
  Filled 2015-12-02: qty 1

## 2015-12-02 MED ORDER — SODIUM CHLORIDE 0.9 % IV BOLUS (SEPSIS)
1000.0000 mL | Freq: Once | INTRAVENOUS | Status: AC
  Administered 2015-12-02: 1000 mL via INTRAVENOUS

## 2015-12-02 MED ORDER — HYDROCODONE-ACETAMINOPHEN 5-325 MG PO TABS: 2.0000 | ORAL_TABLET | ORAL | Status: DC | PRN

## 2015-12-02 MED ORDER — IOHEXOL 300 MG/ML  SOLN
50.0000 mL | Freq: Once | INTRAMUSCULAR | Status: AC | PRN
  Administered 2015-12-02: 50 mL via ORAL

## 2015-12-02 MED ORDER — IOHEXOL 300 MG/ML  SOLN
100.0000 mL | Freq: Once | INTRAMUSCULAR | Status: AC | PRN
  Administered 2015-12-02: 100 mL via INTRAVENOUS

## 2015-12-02 MED ORDER — ONDANSETRON HCL 4 MG/2ML IJ SOLN
4.0000 mg | Freq: Once | INTRAMUSCULAR | Status: AC
  Administered 2015-12-02: 4 mg via INTRAVENOUS
  Filled 2015-12-02: qty 2

## 2015-12-02 NOTE — ED Notes (Signed)
Patient is alert and oriented x4.  He is complaining of abdominal pain that radiates around to his back.   Patient states that he has not a BM since Monday.  Currently he rates his pain 8 of 10. Patient denies Nausea or vomiting.

## 2015-12-02 NOTE — Discharge Instructions (Signed)
Abdominal Pain, Adult Many things can cause belly (abdominal) pain. Most times, the belly pain is not dangerous. Many cases of belly pain can be watched and treated at home. HOME CARE   Do not take medicines that help you go poop (laxatives) unless told to by your doctor.  Only take medicine as told by your doctor.  Eat or drink as told by your doctor. Your doctor will tell you if you should be on a special diet. GET HELP IF:  You do not know what is causing your belly pain.  You have belly pain while you are sick to your stomach (nauseous) or have runny poop (diarrhea).  You have pain while you pee or poop.  Your belly pain wakes you up at night.  You have belly pain that gets worse or better when you eat.  You have belly pain that gets worse when you eat fatty foods.  You have a fever. GET HELP RIGHT AWAY IF:   The pain does not go away within 2 hours.  You keep throwing up (vomiting).  The pain changes and is only in the right or left part of the belly.  You have bloody or tarry looking poop. MAKE SURE YOU:   Understand these instructions.  Will watch your condition.  Will get help right away if you are not doing well or get worse.   This information is not intended to replace advice given to you by your health care provider. Make sure you discuss any questions you have with your health care provider.   Document Released: 03/19/2008 Document Revised: 10/22/2014 Document Reviewed: 06/10/2013 Elsevier Interactive Patient Education 2016 [employer]  Acute Pancreatitis Acute pancreatitis is a disease in which the pancreas becomes suddenly irritated (inflamed). The pancreas is a large gland behind your stomach. The pancreas makes enzymes that help digest food. The pancreas also makes 2 hormones that help control your blood sugar. Acute pancreatitis happens when the enzymes attack and damage the pancreas. Most attacks last a couple of days and can cause serious  problems. HOME CARE  Follow your doctor's diet instructions. You may need to avoid alcohol and limit fat in your diet.  Eat small meals often.  Drink enough fluids to keep your pee (urine) clear or pale yellow.  Only take medicines as told by your doctor.  Avoid drinking alcohol if it caused your disease.  Do not smoke.  Get plenty of rest.  Check your blood sugar at home as told by your doctor.  Keep all doctor visits as told. GET HELP IF:  You do not get better as quickly as expected.  You have new or worsening symptoms.  You have lasting pain, weakness, or feel sick to your stomach (nauseous).  You get better and then have another pain attack. GET HELP RIGHT AWAY IF:   You are unable to eat or keep fluids down.  Your pain becomes severe.  You have a fever or lasting symptoms for more than 2 to 3 days.  You have a fever and your symptoms suddenly get worse.  Your skin or the white part of your eyes turn yellow (jaundice).  You throw up (vomit).  You feel dizzy, or you pass out (faint).  Your blood sugar is high (over 300 mg/dL). MAKE SURE YOU:   Understand these instructions.  Will watch your condition.  Will get help right away if you are not doing well or get worse.   This information is not intended to  replace advice given to you by your health care provider. Make sure you discuss any questions you have with your health care provider.   Document Released: 03/19/2008 Document Revised: 10/22/2014 Document Reviewed: 01/10/2012 Elsevier Interactive Patient Education [employer]

## 2015-12-02 NOTE — ED Provider Notes (Addendum)
CSN: [id number]     Arrival date & time 12/02/15  0123 History   First MD Initiated Contact with Patient 12/02/15 0410     Chief Complaint  Patient presents with  . Abdominal Pain     (Consider location/radiation/quality/duration/timing/severity/associated sxs/prior Treatment) HPI Comments: Patient presents to the ER for evaluation of constant 8 out of 10 abdominal pain. Patient reports that symptoms have been ongoing for 2 days. Patient reports that he has not had a bowel movement in 4 days. This is unusual for him. He reports that the pain is diffuse across his entire abdomen. He has not had any nausea or vomiting. There is no associated diarrhea. He denies urinary symptoms.  Patient is a 55 y.o. male presenting with abdominal pain.  Abdominal Pain   Past Medical History  Diagnosis Date  . Hypertension   . Elevated lipids    Past Surgical History  Procedure Laterality Date  . No past surgeries    . Steriod injection  2005back  . Incision and drainage Left 10/06/2013    Procedure: INCISION AND DRAINAGE LEFT LONG FINGER;  Surgeon: Tami Ribas, MD;  Location: [facility];  Service: Orthopedics;  Laterality: Left;   History reviewed. No pertinent family history. Social History  Substance Use Topics  . Smoking status: Current Every Day Smoker -- 1.00 packs/day for 20 years    Types: Cigarettes  . Smokeless tobacco: Never Used  . Alcohol Use: No    Review of Systems  Gastrointestinal: Positive for abdominal pain.  All other systems reviewed and are negative.     Allergies  Review of patient's allergies indicates no known allergies.  Home Medications   Prior to Admission medications   Not on File   BP 173/104 mmHg  Pulse 61  Temp(Src) 98.1 F (36.7 C) (Oral)  Resp 16  Ht 5\' 4"  (1.626 m)  Wt 135 lb (61.236 kg)  BMI 23.16 kg/m2  SpO2 99% Physical Exam  Constitutional: He is oriented to person, place, and time. He appears well-developed and  well-nourished. No distress.  HENT:  Head: Normocephalic and atraumatic.  Right Ear: Hearing normal.  Left Ear: Hearing normal.  Nose: Nose normal.  Mouth/Throat: Oropharynx is clear and moist and mucous membranes are normal.  Eyes: Conjunctivae and EOM are normal. Pupils are equal, round, and reactive to light.  Neck: Normal range of motion. Neck supple.  Cardiovascular: Regular rhythm, S1 normal and S2 normal.  Exam reveals no gallop and no friction rub.   No murmur heard. Pulmonary/Chest: Effort normal and breath sounds normal. No respiratory distress. He exhibits no tenderness.  Abdominal: Soft. Normal appearance and bowel sounds are normal. There is no hepatosplenomegaly. There is generalized tenderness. There is no rebound, no guarding, no tenderness at McBurney's point and negative Murphy's sign. No hernia.  Patient has equal tenderness in all 4 quadrants without any evidence of mass, guarding, rebound  Genitourinary: Rectum normal.  Musculoskeletal: Normal range of motion.  Neurological: He is alert and oriented to person, place, and time. He has normal strength. No cranial nerve deficit or sensory deficit. Coordination normal. GCS eye subscore is 4. GCS verbal subscore is 5. GCS motor subscore is 6.  Skin: Skin is warm, dry and intact. No rash noted. No cyanosis.  Psychiatric: He has a normal mood and affect. His speech is normal and behavior is normal. Thought content normal.  Nursing note and vitals reviewed.   ED Course  Procedures (including critical care time) Labs  Review Labs Reviewed  LIPASE, BLOOD - Abnormal; Notable for the following:    Lipase 90 (*)    All other components within normal limits  COMPREHENSIVE METABOLIC PANEL - Abnormal; Notable for the following:    AST 14 (*)    ALT 15 (*)    All other components within normal limits  CBC - Abnormal; Notable for the following:    WBC 11.8 (*)    All other components within normal limits  URINALYSIS, ROUTINE W  REFLEX MICROSCOPIC (NOT AT [facility]) - Abnormal; Notable for the following:    APPearance CLOUDY (*)    Hgb urine dipstick TRACE (*)    Leukocytes, UA SMALL (*)    All other components within normal limits  URINE MICROSCOPIC-ADD ON - Abnormal; Notable for the following:    Squamous Epithelial / LPF 0-5 (*)    Bacteria, UA FEW (*)    All other components within normal limits    Imaging Review Ct Abdomen Pelvis W Contrast  12/02/2015  CLINICAL DATA:  Abdominal pain and constipation. Elevated white blood cell count EXAM: CT ABDOMEN AND PELVIS WITH CONTRAST TECHNIQUE: Multidetector CT imaging of the abdomen and pelvis was performed using the standard protocol following bolus administration of intravenous contrast. Oral contrast was also administered. CONTRAST:  OMNIPAQUE IOHEXOL 300 MG/ML  SOLN COMPARISON:  None. FINDINGS: Lower chest: There is atelectatic change in both lung bases, more on the right than on the left. Hepatobiliary: There is fatty infiltration near the fissure for the ligamentum teres. No focal liver lesions are demonstrable. The gallbladder wall does appreciably thickened. There is no biliary duct dilatation. Pancreas: No pancreatic mass or inflammatory focus. Spleen: No splenic lesions. Adrenals/Urinary Tract: Adrenals appear normal bilaterally. There is a cyst arising from the lateral mid left kidney measuring 3.9 x 2.7 cm. There is no hydronephrosis on either side. There is no renal or ureteral calculus on either side. The urinary bladder is midline with wall thickness within normal limits. Stomach/Bowel: Rectum is mildly distended with air. There is questionable thickening along the posterior and left lateral wall of the rectum. There is no other evidence suggesting bowel wall thickening. No mesenteric thickening is appreciable. There is no bowel obstruction. No free air or portal venous air. Vascular/Lymphatic: There is atherosclerotic calcification in the distal aorta and common  iliac arteries with mild peripheral thrombus in the distal aorta. There is no abdominal aortic aneurysm. Major mesenteric vessels appear patent. There is no demonstrable adenopathy in the abdomen or pelvis. Reproductive: Prostate appears mildly prominent and slightly inhomogeneous. Seminal vesicles appear normal. No well-defined mass in the prostate. No pelvic mass or pelvic fluid collection. Other: The appendix appears unremarkable. There is no abscess or ascites in the abdomen or pelvis. Musculoskeletal: There are no blastic or lytic bone lesions. There is a soft tissue lipoma in the left posterior latissimus dorsi muscle. No abdominal wall lesions are identified. IMPRESSION: There is apparent thickening along the posterior and lateral rectal wall. Rectal examination to further evaluate this area is warranted. No other bowel wall thickening. No bowel obstruction. Appendix region appears normal.  No abscess. No renal or ureteral calculus.  No hydronephrosis. Fatty infiltration in liver. Soft tissue lipoma in the left posterior latissimus dorsal muscle. Electronically Signed   By: Bretta Bang III M.D.   On: 12/02/2015 07:21   I have personally reviewed and evaluated these images and lab results as part of my medical decision-making.   EKG Interpretation None  MDM   Final diagnoses:  Acute pancreatitis, unspecified pancreatitis type  Generalized abdominal pain   Patient presents to the emergency part for evaluation of abdominal pain for 2 days. He reports that he has not had a bowel movement for 4 days. He is feeling constipated. Abdominal exam revealed a fused tenderness without guarding or rebound. Patient did have mildly elevated lipase, therefore CT scan was performed. No inflammatory changes noted in the pancreas. He does not have any evidence of obstruction or significant constipation. He did have possible thickening of the rectum. Rectal exam, however, does not show any abnormality.  Patient reassured, will be given a stool softener and analgesia. He was told to maintain a clear liquid diet today and then advance slowly. Return if pain worsens.  I personally performed the services described in this documentation, which was scribed in my presence. The recorded information has been reviewed and is accurate.     Gilda Crease, MD 12/02/15 2952  Gilda Crease, MD 12/02/15 [phone]

## 2015-12-02 NOTE — ED Notes (Signed)
Patient transported to CT 

## 2016-03-19 ENCOUNTER — Encounter (HOSPITAL_COMMUNITY): Payer: Self-pay | Admitting: Emergency Medicine

## 2016-03-19 ENCOUNTER — Emergency Department (HOSPITAL_COMMUNITY): Payer: Medicare Other

## 2016-03-19 ENCOUNTER — Emergency Department (HOSPITAL_COMMUNITY)
Admission: EM | Admit: 2016-03-19 | Discharge: 2016-03-19 | Disposition: A | Discharge status: Home / Self Care | Payer: Medicare Other | Attending: Emergency Medicine | Admitting: Emergency Medicine

## 2016-03-19 DIAGNOSIS — R0789 Other chest pain: Secondary | ICD-10-CM | POA: Insufficient documentation

## 2016-03-19 DIAGNOSIS — I10 Essential (primary) hypertension: Secondary | ICD-10-CM | POA: Diagnosis not present

## 2016-03-19 DIAGNOSIS — R079 Chest pain, unspecified: Secondary | ICD-10-CM | POA: Diagnosis present

## 2016-03-19 DIAGNOSIS — Z8639 Personal history of other endocrine, nutritional and metabolic disease: Secondary | ICD-10-CM | POA: Diagnosis not present

## 2016-03-19 DIAGNOSIS — F17210 Nicotine dependence, cigarettes, uncomplicated: Secondary | ICD-10-CM | POA: Diagnosis not present

## 2016-03-19 LAB — COMPREHENSIVE METABOLIC PANEL
ALBUMIN: 3.4 g/dL — AB (ref 3.5–5.0)
ALT: 14 U/L — AB (ref 17–63)
AST: 16 U/L (ref 15–41)
Alkaline Phosphatase: 66 U/L (ref 38–126)
Anion gap: 5 (ref 5–15)
BUN: 16 mg/dL (ref 6–20)
CHLORIDE: 109 mmol/L (ref 101–111)
CO2: 25 mmol/L (ref 22–32)
CREATININE: 1.02 mg/dL (ref 0.61–1.24)
Calcium: 9 mg/dL (ref 8.9–10.3)
GFR calc Af Amer: >60 mL/min (ref >60)
GFR calc non Af Amer: >60 mL/min (ref >60)
GLUCOSE: 92 mg/dL (ref 65–99)
POTASSIUM: 3.5 mmol/L (ref 3.5–5.1)
SODIUM: 139 mmol/L (ref 135–145)
Total Bilirubin: 0.4 mg/dL (ref 0.3–1.2)
Total Protein: 6.5 g/dL (ref 6.5–8.1)

## 2016-03-19 LAB — CBC
HCT: 38.5 % — ABNORMAL LOW (ref 39.0–52.0)
Hemoglobin: 12.7 g/dL — ABNORMAL LOW (ref 13.0–17.0)
MCH: 27.4 pg (ref 26.0–34.0)
MCHC: 33 g/dL (ref 30.0–36.0)
MCV: 83 fL (ref 78.0–100.0)
PLATELETS: 272 10*3/uL (ref 150–400)
RBC: 4.64 MIL/uL (ref 4.22–5.81)
RDW: 13.6 % (ref 11.5–15.5)
WBC: 10.8 10*3/uL — AB (ref 4.0–10.5)

## 2016-03-19 LAB — I-STAT TROPONIN, ED: Troponin i, poc: 0 ng/mL (ref 0.00–0.08)

## 2016-03-19 NOTE — ED Provider Notes (Signed)
CSN: [id number]     Arrival date & time 03/19/16  1259 History   First MD Initiated Contact with Patient 03/19/16 1600     Chief Complaint  Patient presents with  . Chest Pain     (Consider location/radiation/quality/duration/timing/severity/associated sxs/prior Treatment) HPI   Damon [name] is a 55 y.o. male  who presents for evaluation of constant chest pain for several days, without trauma. The pain is worse with deep breathing, coughing, and turning left to right. No fever, chills, nausea, vomiting, shortness of breath, diaphoresis or weakness. No known trauma. No similar problem in the past. There are no other known modifying factors.   Past Medical History  Diagnosis Date  . Hypertension   . Elevated lipids    Past Surgical History  Procedure Laterality Date  . No past surgeries    . Steriod injection  2005back  . Incision and drainage Left 10/06/2013    Procedure: INCISION AND DRAINAGE LEFT LONG FINGER;  Surgeon: Tami Ribas, MD;  Location: [facility];  Service: Orthopedics;  Laterality: Left;   No family history on file. Social History  Substance Use Topics  . Smoking status: Current Every Day Smoker -- 1.00 packs/day for 20 years    Types: Cigarettes  . Smokeless tobacco: Never Used  . Alcohol Use: No    Review of Systems  All other systems reviewed and are negative.     Allergies  Review of patient's allergies indicates no known allergies.  Home Medications   Prior to Admission medications   Medication Sig Start Date End Date Taking? Authorizing Provider  HYDROcodone-acetaminophen (NORCO/VICODIN) 5-325 MG tablet Take 2 tablets by mouth every 4 (four) hours as needed for moderate pain. 12/02/15  Yes Gilda Crease, MD  HYDROcodone-acetaminophen (NORCO/VICODIN) 5-325 MG tablet Take 1 tablet by mouth daily as needed for moderate pain.   Yes Historical Provider, MD  docusate sodium (COLACE) 100 MG capsule Take 1 capsule (100 mg total) by  mouth every 12 (twelve) hours. 12/02/15   Gilda Crease, MD   BP 126/88 mmHg  Pulse 83  Temp(Src) 98.2 F (36.8 C) (Oral)  Resp 18  Ht  (1.626 m)  Wt 153 lb (69.4 kg)  BMI 26.25 kg/m2  SpO2 100% Physical Exam  Constitutional: He is oriented to person, place, and time. He appears well-developed and well-nourished. No distress.  HENT:  Head: Normocephalic and atraumatic.  Right Ear: External ear normal.  Left Ear: External ear normal.  Eyes: Conjunctivae and EOM are normal. Pupils are equal, round, and reactive to light.  Neck: Normal range of motion and phonation normal. Neck supple.  Cardiovascular: Normal rate, regular rhythm and normal heart sounds.   Pulmonary/Chest: Effort normal and breath sounds normal. He exhibits tenderness (Left upper chest wall, moderate. No associated crepitation). He exhibits no bony tenderness.  Abdominal: Soft. There is no tenderness.  Musculoskeletal: Normal range of motion.  Neurological: He is alert and oriented to person, place, and time. No cranial nerve deficit or sensory deficit. He exhibits normal muscle tone. Coordination normal.  Skin: Skin is warm, dry and intact.  Psychiatric: He has a normal mood and affect. His behavior is normal. Judgment and thought content normal.  Nursing note and vitals reviewed.   ED Course  Procedures (including critical care time)  Medications - No data to display  Patient Vitals for the past 24 hrs:  BP Temp Temp src Pulse Resp SpO2 Height Weight  03/19/16 1308 126/88 mmHg 98.2  F (36.8 C) Oral 83 18 100 % 5\' 4"  (1.626 m) 153 lb (69.4 kg)    4:51 PM Reevaluation with update and discussion. After initial assessment and treatment, an updated evaluation reveals No further complaints. Findings discussed with patient and all questions were answered. Tametria Aho L    Labs Review Labs Reviewed  CBC - Abnormal; Notable for the following:    WBC 10.8 (*)    Hemoglobin 12.7 (*)    HCT 38.5 (*)     All other components within normal limits  COMPREHENSIVE METABOLIC PANEL - Abnormal; Notable for the following:    Albumin 3.4 (*)    ALT 14 (*)    All other components within normal limits  I-STAT TROPOININ, ED    Imaging Review Dg Chest 2 View  03/19/2016  CLINICAL DATA:  Chest pain for 3 days.  Shortness of breath EXAM: CHEST  2 VIEW COMPARISON:  08/03/2012 FINDINGS: The heart size and mediastinal contours are within normal limits. Both lungs are clear. The visualized skeletal structures are unremarkable. IMPRESSION: No active cardiopulmonary disease. Electronically Signed   By: Charlett NoseKevin  Dover M.D.   On: 03/19/2016 14:29   I have personally reviewed and evaluated these images and lab results as part of my medical decision-making.   EKG Interpretation   Date/Time:  Monday March 19 2016 13:07:51 EDT Ventricular Rate:  80 PR Interval:  152 QRS Duration: 72 QT Interval:  368 QTC Calculation: 424 R Axis:   63 Text Interpretation:  Normal sinus rhythm with sinus arrhythmia Minimal  voltage criteria for LVH, may be normal variant Borderline ECG No old  tracing to compare Confirmed by [facility]  MD, Alhassan Everingham [phone]([id number]) on 03/19/2016  4:00:54 PM      MDM   Final diagnoses:  Chest wall pain    Chest wall pain, without evidence for PE, pneumonia, or ACS.  Nursing Notes Reviewed/ Care Coordinated Applicable Imaging Reviewed Interpretation of Laboratory Data incorporated into ED treatment  The patient appears reasonably screened and/or stabilized for discharge and I doubt any other medical condition or other [facility] requiring further screening, evaluation, or treatment in the ED at this time prior to discharge.  Plan: Home Medications- IBU; Home Treatments- rest, heat; return here if the recommended treatment, does not improve the symptoms; Recommended follow up- PCP prn     Mancel BaleElliott Morrison Masser, MD 03/19/16 [phone]

## 2016-03-19 NOTE — Discharge Instructions (Signed)
Take ibuprofen 400 mg 3 times a day with meals for pain. Use heat on the sore area of your chest, 3 or 4 times a day to help the pain.   Chest Wall Pain Chest wall pain is pain in or around the bones and muscles of your chest. Sometimes, an injury causes this pain. Sometimes, the cause may not be known. This pain may take several weeks or longer to get better. HOME CARE INSTRUCTIONS  Pay attention to any changes in your symptoms. Take these actions to help with your pain:   Rest as told by your health care provider.   Avoid activities that cause pain. These include any activities that use your chest muscles or your abdominal and side muscles to lift heavy items.   If directed, apply ice to the painful area:  Put ice in a plastic bag.  Place a towel between your skin and the bag.  Leave the ice on for 20 minutes, 2-3 times per day.  Take over-the-counter and prescription medicines only as told by your health care provider.  Do not use tobacco products, including cigarettes, chewing tobacco, and e-cigarettes. If you need help quitting, ask your health care provider.  Keep all follow-up visits as told by your health care provider. This is important. SEEK MEDICAL CARE IF:  You have a fever.  Your chest pain becomes worse.  You have new symptoms. SEEK IMMEDIATE MEDICAL CARE IF:  You have nausea or vomiting.  You feel sweaty or light-headed.  You have a cough with phlegm (sputum) or you cough up blood.  You develop shortness of breath.   This information is not intended to replace advice given to you by your health care provider. Make sure you discuss any questions you have with your health care provider.   Document Released: 10/01/2005 Document Revised: 06/22/2015 Document Reviewed: 12/27/2014 Elsevier Interactive Patient Education [employer]2016 Elsevier Inc.

## 2016-03-19 NOTE — ED Notes (Signed)
Chest pain since Friday and left arm is sore no n/v/ sob or cough

## 2017-10-29 ENCOUNTER — Encounter: Payer: Self-pay | Admitting: Gastroenterology

## 2017-11-26 ENCOUNTER — Encounter (INDEPENDENT_AMBULATORY_CARE_PROVIDER_SITE_OTHER): Payer: Self-pay

## 2017-11-26 ENCOUNTER — Other Ambulatory Visit (INDEPENDENT_AMBULATORY_CARE_PROVIDER_SITE_OTHER): Payer: Medicare Other

## 2017-11-26 ENCOUNTER — Ambulatory Visit (INDEPENDENT_AMBULATORY_CARE_PROVIDER_SITE_OTHER): Payer: Medicare Other | Admitting: Gastroenterology

## 2017-11-26 ENCOUNTER — Encounter: Payer: Self-pay | Admitting: Gastroenterology

## 2017-11-26 VITALS — BP 120/90 | HR 88 | Ht 63.5 in | Wt 143.1 lb

## 2017-11-26 DIAGNOSIS — R1013 Epigastric pain: Secondary | ICD-10-CM

## 2017-11-26 DIAGNOSIS — R634 Abnormal weight loss: Secondary | ICD-10-CM | POA: Diagnosis not present

## 2017-11-26 LAB — COMPREHENSIVE METABOLIC PANEL
ALBUMIN: 4.3 g/dL (ref 3.5–5.2)
ALK PHOS: 76 U/L (ref 39–117)
ALT: 10 U/L (ref 0–53)
AST: 12 U/L (ref 0–37)
BUN: 19 mg/dL (ref 6–23)
CO2: 27 mEq/L (ref 19–32)
Calcium: 9.5 mg/dL (ref 8.4–10.5)
Chloride: 104 mEq/L (ref 96–112)
Creatinine, Ser: 0.96 mg/dL (ref 0.40–1.50)
GFR: 104.44 mL/min (ref >60.00)
GLUCOSE: 95 mg/dL (ref 70–99)
POTASSIUM: 4.1 meq/L (ref 3.5–5.1)
Sodium: 139 mEq/L (ref 135–145)
TOTAL PROTEIN: 7.4 g/dL (ref 6.0–8.3)
Total Bilirubin: 0.4 mg/dL (ref 0.2–1.2)

## 2017-11-26 LAB — CBC WITH DIFFERENTIAL/PLATELET
BASOS ABS: 0.1 10*3/uL (ref 0.0–0.1)
Basophils Relative: 1.2 % (ref 0.0–3.0)
EOS PCT: 1.9 % (ref 0.0–5.0)
Eosinophils Absolute: 0.2 10*3/uL (ref 0.0–0.7)
HEMATOCRIT: 42.5 % (ref 39.0–52.0)
HEMOGLOBIN: 14.2 g/dL (ref 13.0–17.0)
LYMPHS PCT: 23.1 % (ref 12.0–46.0)
Lymphs Abs: 2 10*3/uL (ref 0.7–4.0)
MCHC: 33.4 g/dL (ref 30.0–36.0)
MCV: 84.7 fl (ref 78.0–100.0)
Monocytes Absolute: 0.7 10*3/uL (ref 0.1–1.0)
Monocytes Relative: 8.6 % (ref 3.0–12.0)
Neutro Abs: 5.6 10*3/uL (ref 1.4–7.7)
Neutrophils Relative %: 65.2 % (ref 43.0–77.0)
Platelets: 302 10*3/uL (ref 150.0–400.0)
RBC: 5.02 Mil/uL (ref 4.22–5.81)
RDW: 14.5 % (ref 11.5–15.5)
WBC: 8.7 10*3/uL (ref 4.0–10.5)

## 2017-11-26 NOTE — Progress Notes (Signed)
HPI: This is a very pleasant 57 year old man who [facility]  Chief complaint is epigastric pain, weight loss, anorexia  Burning epigastric region for about a year.  Tried prilosec once daily for about week no help. Protonis for about 2 weeks daily improved. Never pyrosis. Just burning in epigastric. Eating does not change.  Poor appetite for 2-3 weeks.  WAs taking BCs this past fall for back pains.  1 a day.  No other NSAIDs.  Takes tylenol.  Lost 15 poiunds at least in the past year or so.  Smoker 1/2 PPD  Non-drinker.  He says his mother had some type of "stomach cancer"   Old Data Reviewed: CT scan abdomen pelvis with IV and oral contrast February 2017; done for abdominal pain and constipation: There is apparent thickening along the posterior and lateral rectal wall. Rectal examination to further evaluate this area is warranted. No other bowel wall thickening. No bowel obstruction. Appendix region appears normal.  No abscess. No renal or ureteral calculus.  No hydronephrosis. Fatty infiltration in liver. Soft tissue lipoma in the left posterior latissimus dorsal muscle.    Review of systems: Pertinent positive and negative review of systems were noted in the above HPI section. All other review negative.   Past Medical History:  Diagnosis Date  . Elevated lipids   . Hypertension     Past Surgical History:  Procedure Laterality Date  . INCISION AND DRAINAGE Left 10/06/2013   Procedure: INCISION AND DRAINAGE LEFT LONG FINGER;  Surgeon: Tami RibasKevin R Kuzma, MD;  Location: [facility];  Service: Orthopedics;  Laterality: Left;  . STERIOD INJECTION  2005back    Current Outpatient Medications  Medication Sig Dispense Refill  . amLODipine (NORVASC) 5 MG tablet Take 1 tablet by mouth daily.  1  . atorvastatin (LIPITOR) 10 MG tablet Take 1 tablet by mouth daily.  0  . pantoprazole (PROTONIX) 40 MG tablet Take 1 tablet by mouth daily.   1   No current facility-administered medications for this visit.     Allergies as of 11/26/2017  . (No Known Allergies)    Family History  Problem Relation Age of Onset  . Colon cancer Mother 676  . Diabetes Mother   . Lung cancer Father   . Diabetes Sister   . Heart attack Brother   . Cancer Brother        type unknown    Social History   Socioeconomic History  . Marital status: Married    Spouse name: Not on file  . Number of children: 5  . Years of education: Not on file  . Highest education level: Not on file  Social Needs  . Financial resource strain: Not on file  . Food insecurity - worry: Not on file  . Food insecurity - inability: Not on file  . Transportation needs - medical: Not on file  . Transportation needs - non-medical: Not on file  Occupational History  . Not on file  Tobacco Use  . Smoking status: Current Every Day Smoker    Packs/day: 1.00    Years: 20.00    Pack years: 20.00    Types: Cigarettes  . Smokeless tobacco: Never Used  Substance and Sexual Activity  . Alcohol use: No  . Drug use: No  . Sexual activity: Not on file  Other Topics Concern  . Not on file  Social History Narrative  . Not on file  Physical Exam: BP 120/90 (BP Location: Left Arm, Patient Position: Sitting, Cuff Size: Normal)   Pulse 88   Ht 5' 3.5" (1.613 m) Comment: height measured without shoes  Wt 143 lb 2 oz (64.9 kg)   BMI 24.96 kg/m  Constitutional: generally well-appearing Psychiatric: alert and oriented x3 Eyes: extraocular movements intact Mouth: oral pharynx moist, no lesions Neck: supple no lymphadenopathy Cardiovascular: heart regular rate and rhythm Lungs: clear to auscultation bilaterally Abdomen: soft, nontender, nondistended, no obvious ascites, no peritoneal signs, normal bowel sounds Extremities: no lower extremity edema bilaterally Skin: no lesions on visible extremities   Assessment and plan: 57 y.o. male with epigastric burning,  weight loss, anorexia  I do have some concern about possible underlying malignancy and I want to begin the workup with a battery of blood tests and CAT scan abdomen and pelvis.  If this does not clearly explain what is going on he might need further testing such as an upper endoscopy.  In the meantime he will stay on his proton pump inhibitor Protonix 1 pill once daily.    Please see the "Patient Instructions" section for addition details about the plan.   Rob Bunting, MD [facility] 11/26/2017, 2:13 PM  Cc: [facility]

## 2017-11-26 NOTE — Patient Instructions (Addendum)
You will be set up for a CT scan of abdomen and pelvis with IV and oral contrast (for epigastric pain, weight loss).  You have been scheduled for a CT scan of the abdomen and pelvis at [facility] ([address]---this is in the same building as Press photographer).   You are scheduled on 12/03/17 at 330 pm. You should arrive 15 minutes prior to your appointment time for registration. Please follow the written instructions below on the day of your exam:  WARNING: IF YOU ARE ALLERGIC TO IODINE/X-RAY DYE, PLEASE NOTIFY RADIOLOGY IMMEDIATELY AT [phone]! YOU WILL BE GIVEN A 13 HOUR PREMEDICATION PREP.  1) Do not eat or drink anything after 1130 am (4 hours prior to your test) 2) You have been given 2 bottles of oral contrast to drink. The solution may taste better if refrigerated, but do NOT add ice or any other liquid to this solution. Shake well before drinking.    Drink 1 bottle of contrast @ 130 pm (2 hours prior to your exam)  Drink 1 bottle of contrast @ 230 pm (1 hour prior to your exam)  You may take any medications as prescribed with a small amount of water except for the following: Metformin, Glucophage, Glucovance, Avandamet, Riomet, Fortamet, Actoplus Met, Janumet, Glumetza or Metaglip. The above medications must be held the day of the exam AND 48 hours after the exam.  The purpose of you drinking the oral contrast is to aid in the visualization of your intestinal tract. The contrast solution may cause some diarrhea. Before your exam is started, you will be given a small amount of fluid to drink. Depending on your individual set of symptoms, you may also receive an intravenous injection of x-ray contrast/dye. Plan on being at [facility] for 30 minutes or longer, depending on the type of exam you are having performed.  This test typically takes 30-45 minutes to complete.  If you have any questions regarding your exam or if you need to reschedule, you may call the  CT department at [phone] between the hours of 8:00 am and 5:00 pm, Monday-Friday.  ________________________________________________________________   Dennis Bast will have labs checked today in the basement lab.  Please head down after you check out with the front desk  (cbc, cmet).  You may need further testing pending the results of the above.  Normal BMI (Body Mass Index- based on height and weight) is between 19 and 25. Your BMI today is Body mass index is 24.96 kg/m. Marland Kitchen Please consider follow up  regarding your BMI with your Primary Care Provider.

## 2017-12-03 ENCOUNTER — Ambulatory Visit (INDEPENDENT_AMBULATORY_CARE_PROVIDER_SITE_OTHER)
Admission: RE | Admit: 2017-12-03 | Discharge: 2017-12-03 | Disposition: A | Discharge status: Home / Self Care | Payer: Medicare Other | Source: Ambulatory Visit | Attending: Gastroenterology | Admitting: Gastroenterology

## 2017-12-03 DIAGNOSIS — R634 Abnormal weight loss: Secondary | ICD-10-CM

## 2017-12-03 MED ORDER — IOPAMIDOL (ISOVUE-300) INJECTION 61%
100.0000 mL | Freq: Once | INTRAVENOUS | Status: AC | PRN
  Administered 2017-12-03: 100 mL via INTRAVENOUS

## 2018-01-24 ENCOUNTER — Telehealth: Payer: Self-pay

## 2018-01-24 NOTE — Telephone Encounter (Signed)
Patient No Showed for PV. Patient was called to reschedule his appointment but there was no answer. A message was left on his voicemail to call and reschedule before 5:00 Pm today or his procedure will be cancelled per [facility] guidelines. If patient does not reschedule a no show letter will be mailed at the end of the day.   Janalee DaneNancy Bernie Ransford, LPN ( PV )

## 2018-01-28 ENCOUNTER — Encounter: Payer: Medicare Other | Admitting: Gastroenterology

## 2019-04-14 IMAGING — CT CT ABD-PELV W/ CM
2 of 5 series · 16 of 46 positions shown, 18 images · IV contrast (ISOVUE 300)
Comparison: 12/02/2015

CLINICAL DATA: Mid abdominal burning sensation for 6 months with
nausea, decreased appetite, and weight loss.

EXAM:
CT ABDOMEN AND PELVIS WITH CONTRAST
TECHNIQUE: Multidetector CT imaging of the abdomen and pelvis was performed
using the standard protocol following bolus administration of
intravenous contrast.
CONTRAST:  100mL WH5C5Y-1YY IOPAMIDOL (WH5C5Y-1YY) INJECTION 61%

[Series 2: abd/pel w · axial · 0.69mm/px · z∈[-427,-92]mm · 13 of 75 slices shown, 15 images]
[im 4/75  soft-tissue]
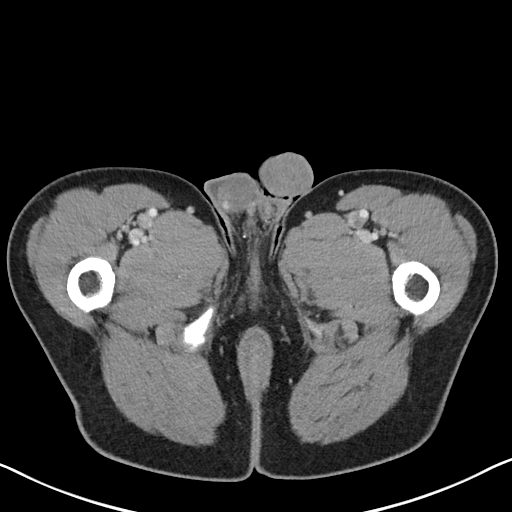
[im 4/75  bone]
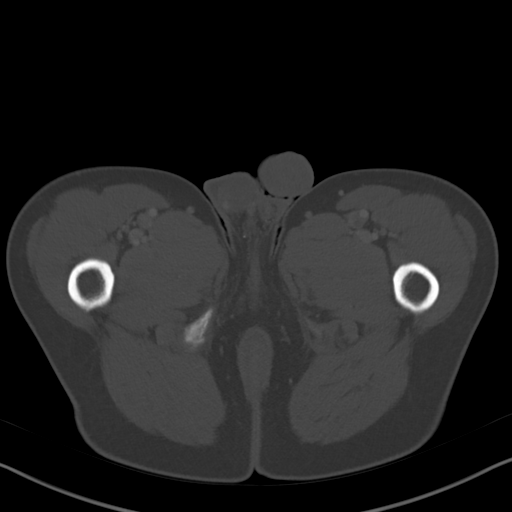
[im 12/75  soft-tissue]
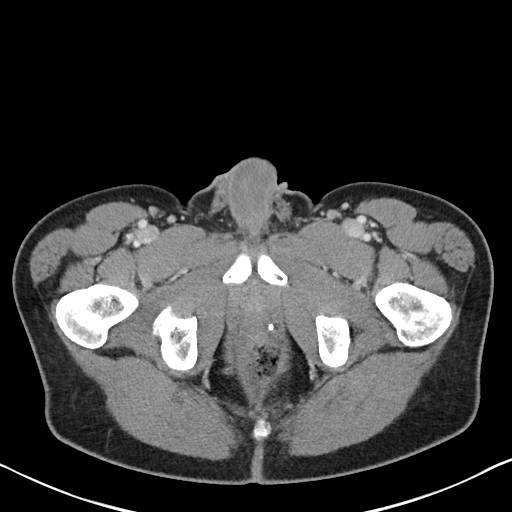
[im 16/75  soft-tissue]
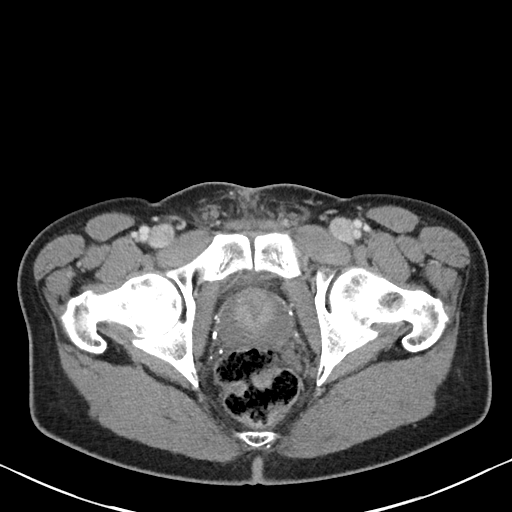
[im 20/75  soft-tissue]
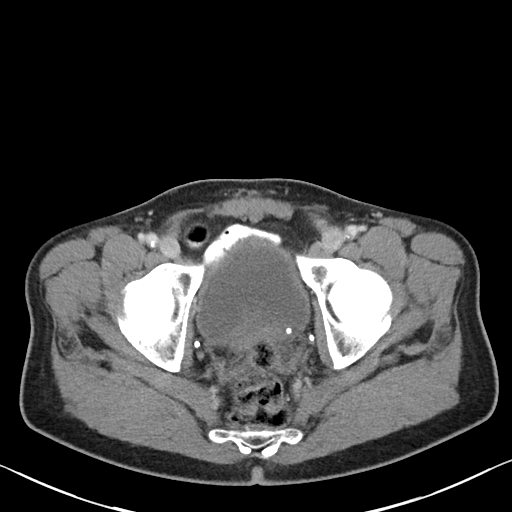
[im 28/75  soft-tissue]
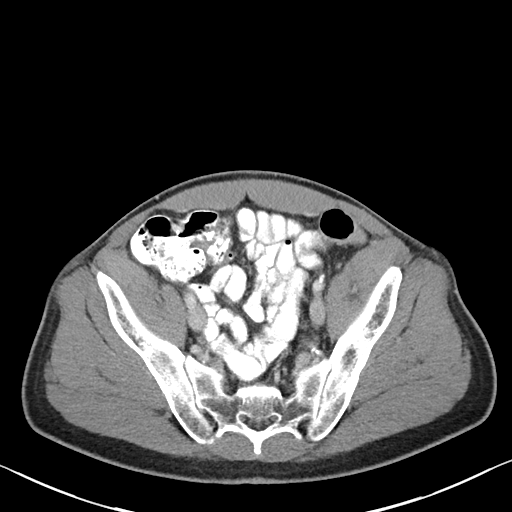
[im 32/75  soft-tissue]
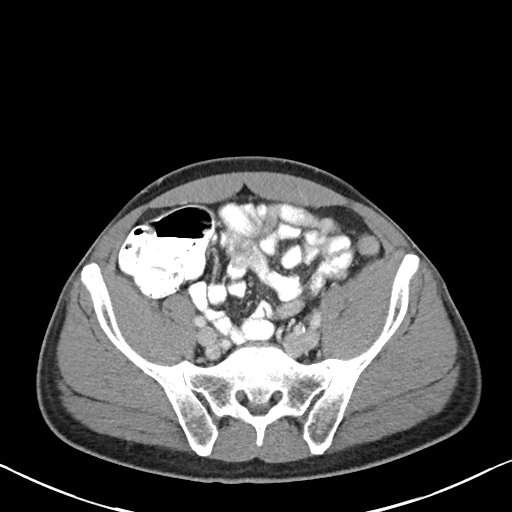
[im 39/75  soft-tissue]
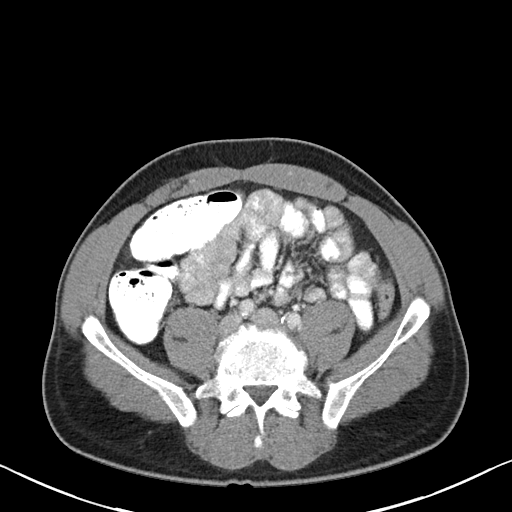
[im 43/75  soft-tissue]
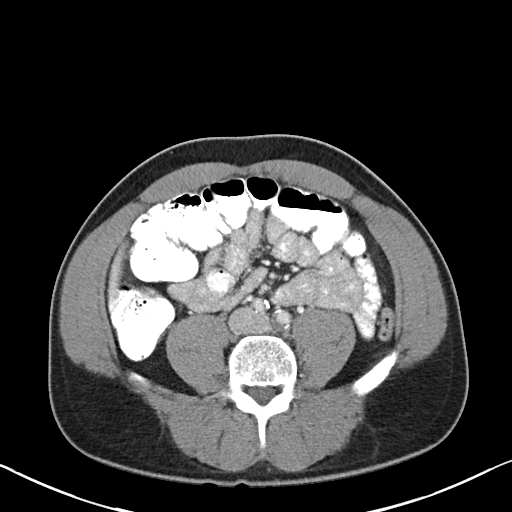
[im 47/75  soft-tissue]
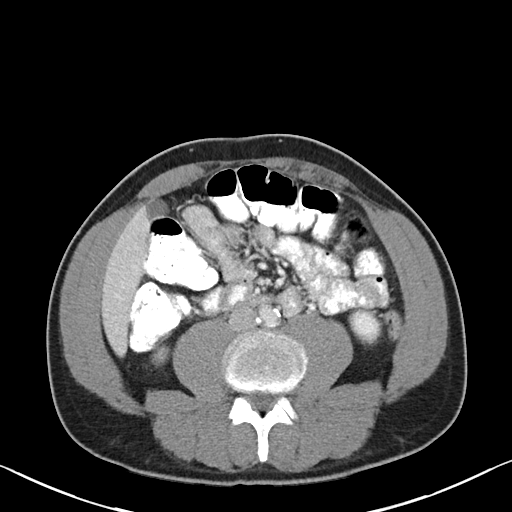
[im 47/75  bone]
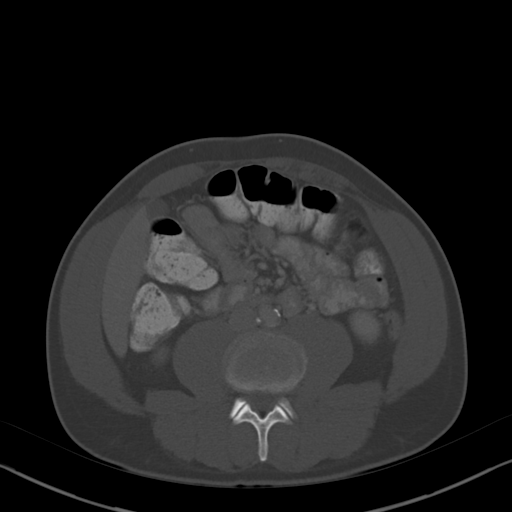
[im 55/75  soft-tissue]
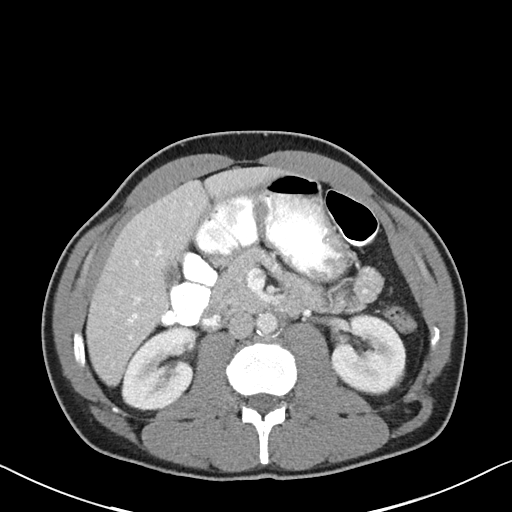
[im 59/75  soft-tissue]
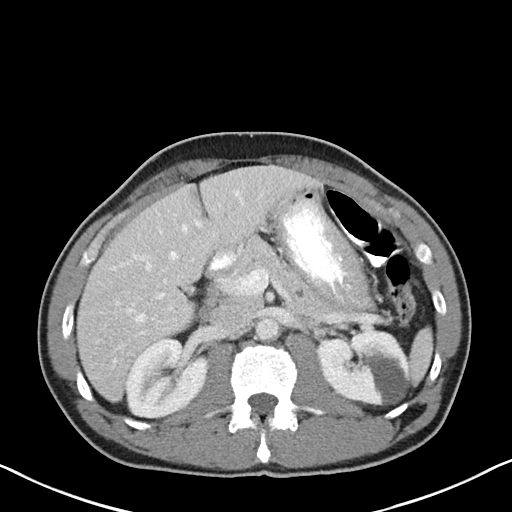
[im 63/75  soft-tissue]
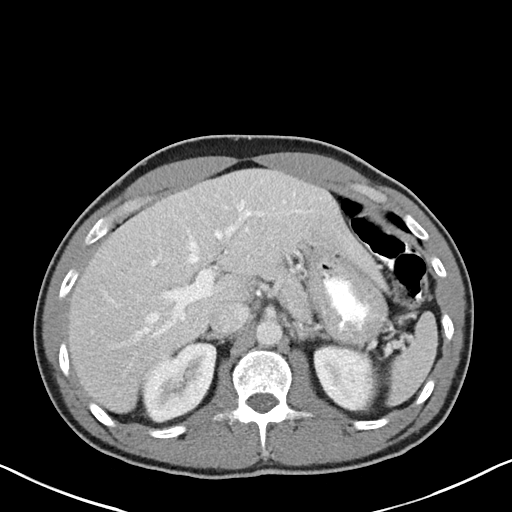
[im 71/75  soft-tissue]
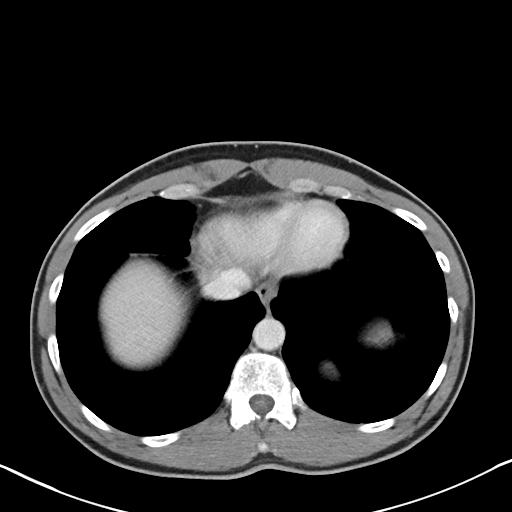

[Series 6: abd/pel w st · coronal · 0.62mm/px · 3 of 77 slices shown]
[im 26/77  soft-tissue]
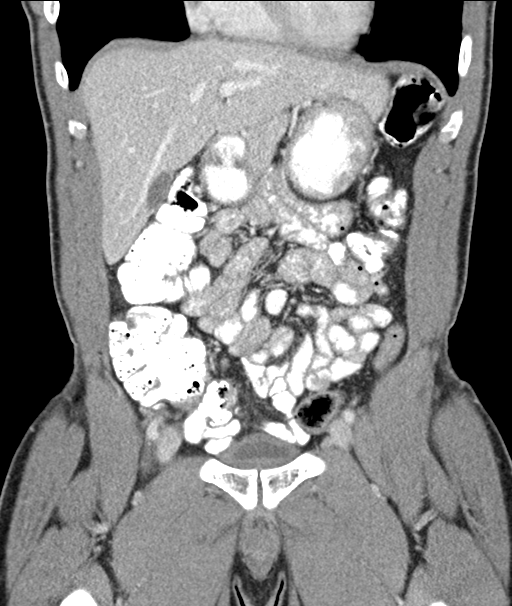
[im 34/77  soft-tissue]
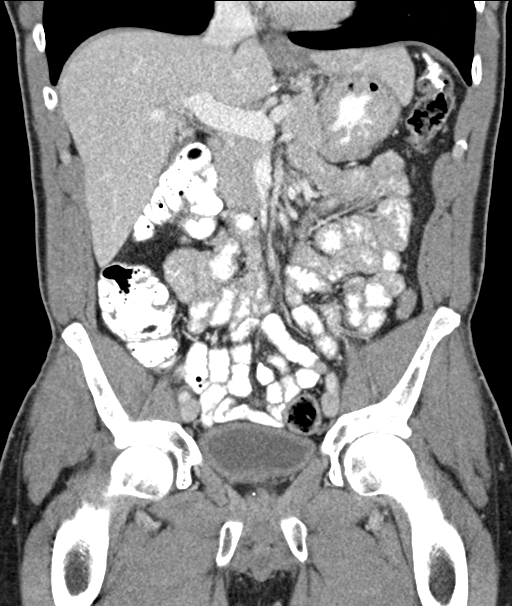
[im 43/77  soft-tissue]
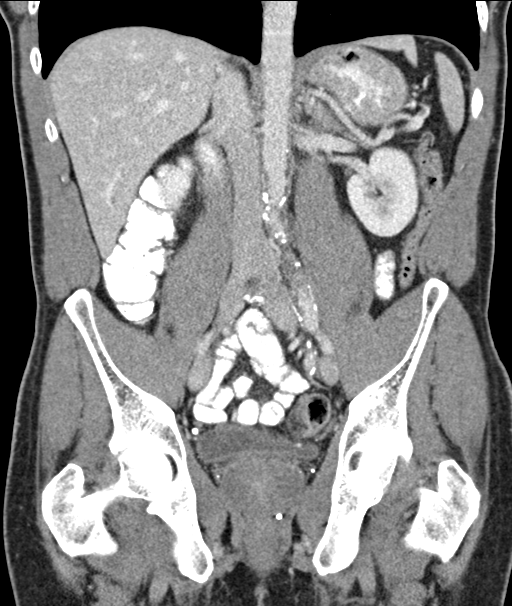

[16 of 46 positions shown; findings below may reference images not displayed]

FINDINGS: Lower chest: Unremarkable

Hepatobiliary: 3 mm hypodense lesion posteriorly in the right
hepatic lobe on image [DATE], likely a cyst or similar benign
structure. Gallbladder unremarkable. No biliary dilatation.

Pancreas: 5 mm hypodense lesion in the pancreatic tail on image
[DATE], stable.

Spleen: Unremarkable

Adrenals/Urinary Tract: Adrenal glands normal. 3.2 by 2.3 cm fluid
density lesion of the left mid kidney is similar to the prior exam
and favors a benign cyst.

Stomach/Bowel: Unremarkable

Vascular/Lymphatic: Aortoiliac atherosclerotic vascular disease. No
pathologic adenopathy identified.

Reproductive: The prostate gland measures 5.4 by 4.1 by 4.6 cm
(volume = 53 cm^3).

Other: No supplemental non-categorized findings.

Musculoskeletal: Spurring of both sacroiliac joints. Small lipoma
just deep to the left lower latissimus dorsi muscle.
IMPRESSION: 1. A specific cause for the patient's symptoms is not identified.
2. Stable 5 mm hypodense lesion in the pancreatic tail on image
[DATE]. This is most likely to be a small pancreatic cyst or dilated
side duct. Intraductal papillary mucinous neoplasm is a less likely
differential diagnostic consideration but difficult to totally
exclude. We have currently demonstrated 2 years of stability. Follow
up pancreatic protocol MRI in 1 years time is recommended. This
recommendation follows ACR consensus guidelines: Management of
Incidental Pancreatic Cysts: A White Paper of the ACR Incidental
Findings Committee. [HOSPITAL] 5186;[DATE].
3.  Aortic Atherosclerosis ([id number]-AFR.R).
4. Mild prostatomegaly.

## 2019-12-07 ENCOUNTER — Telehealth: Payer: Self-pay

## 2019-12-07 NOTE — Telephone Encounter (Signed)
-----   Message from Brandt Loosen, RN sent at 12/06/2017  1:32 PM EST ----- MRI of the pancreas in 2 years

## 2019-12-07 NOTE — Telephone Encounter (Signed)
Left message on machine to call back  

## 2019-12-08 NOTE — Telephone Encounter (Signed)
Left message on machine to call back unable to reach pt letter mailed.  

## 2022-10-01 ENCOUNTER — Emergency Department (HOSPITAL_COMMUNITY)
Admission: EM | Admit: 2022-10-01 | Discharge: 2022-10-02 | Disposition: A | Discharge status: Home / Self Care | Payer: Medicare Other | Source: Home / Self Care | Attending: Emergency Medicine | Admitting: Emergency Medicine

## 2022-10-01 ENCOUNTER — Other Ambulatory Visit: Payer: Self-pay

## 2022-10-01 ENCOUNTER — Encounter (HOSPITAL_COMMUNITY): Payer: Self-pay

## 2022-10-01 ENCOUNTER — Ambulatory Visit
Admission: EM | Admit: 2022-10-01 | Discharge: 2022-10-01 | Payer: Medicare Other | Attending: Internal Medicine | Admitting: Internal Medicine

## 2022-10-01 DIAGNOSIS — J111 Influenza due to unidentified influenza virus with other respiratory manifestations: Secondary | ICD-10-CM

## 2022-10-01 DIAGNOSIS — R509 Fever, unspecified: Secondary | ICD-10-CM | POA: Diagnosis not present

## 2022-10-01 DIAGNOSIS — R4182 Altered mental status, unspecified: Secondary | ICD-10-CM

## 2022-10-01 DIAGNOSIS — Z1152 Encounter for screening for COVID-19: Secondary | ICD-10-CM | POA: Insufficient documentation

## 2022-10-01 DIAGNOSIS — R0902 Hypoxemia: Secondary | ICD-10-CM

## 2022-10-01 DIAGNOSIS — A411 Sepsis due to other specified staphylococcus: Secondary | ICD-10-CM | POA: Diagnosis not present

## 2022-10-01 DIAGNOSIS — J101 Influenza due to other identified influenza virus with other respiratory manifestations: Secondary | ICD-10-CM | POA: Insufficient documentation

## 2022-10-01 DIAGNOSIS — R1084 Generalized abdominal pain: Secondary | ICD-10-CM | POA: Insufficient documentation

## 2022-10-01 LAB — POCT URINALYSIS DIP (MANUAL ENTRY)
Bilirubin, UA: NEGATIVE
Glucose, UA: NEGATIVE mg/dL
Leukocytes, UA: NEGATIVE
Nitrite, UA: NEGATIVE
Protein Ur, POC: >=300 mg/dL — AB
Spec Grav, UA: 1.025 (ref 1.010–1.025)
Urobilinogen, UA: 0.2 E.U./dL
pH, UA: 6 (ref 5.0–8.0)

## 2022-10-01 LAB — RESP PANEL BY RT-PCR (RSV, FLU A&B, COVID)  RVPGX2
Influenza A by PCR: POSITIVE — AB
Influenza B by PCR: NEGATIVE
Resp Syncytial Virus by PCR: NEGATIVE
SARS Coronavirus 2 by RT PCR: NEGATIVE

## 2022-10-01 MED ORDER — ACETAMINOPHEN 325 MG PO TABS
650.0000 mg | ORAL_TABLET | Freq: Once | ORAL | Status: AC
  Administered 2022-10-01: 650 mg via ORAL

## 2022-10-01 NOTE — ED Provider Notes (Addendum)
[name]    CSN: [id number] Arrival date & time: 10/01/22  1821      History   Chief Complaint Chief Complaint  Patient presents with   Altered Mental Status    HPI Damon [name] is a 61 y.o. male for evaluation of fever and altered mental status.  Patient is accompanied by his granddaughter.  Granddaughter reports for the past week and a half he has had upper respiratory symptoms including cough, congestion, fever, chills.  She states today he began to act confused and was stumbling nearly falling.  He does endorse dizziness.  He denies any history of asthma or COPD.  He does not take any medications prescribed.  He is unclear if he is vaccinated for flu or COVID.  Denies any urinary symptoms but granddaughter did report he was incontinent today which is unusual.  He does not wear oxygen at home and was hypoxic on arrival at 88% on room air.   Altered Mental Status Presenting symptoms: confusion   Associated symptoms: fever     Past Medical History:  Diagnosis Date   Elevated lipids    Hypertension     There are no problems to display for this patient.   Past Surgical History:  Procedure Laterality Date   INCISION AND DRAINAGE Left 10/06/2013   Procedure: INCISION AND DRAINAGE LEFT LONG FINGER;  Surgeon: Tami Ribas, MD;  Location: [facility];  Service: Orthopedics;  Laterality: Left;   STERIOD INJECTION  2005back       Home Medications    Prior to Admission medications   Medication Sig Start Date End Date Taking? Authorizing Provider  amLODipine (NORVASC) 5 MG tablet Take 1 tablet by mouth daily. 10/21/17   [provider]  atorvastatin (LIPITOR) 10 MG tablet Take 1 tablet by mouth daily. 11/23/17   [provider]  pantoprazole (PROTONIX) 40 MG tablet Take 1 tablet by mouth daily. 10/21/17   [provider]    Family History Family History  Problem Relation Age of Onset   Colon cancer Mother 6    Diabetes Mother    Lung cancer Father    Diabetes Sister    Heart attack Brother    Cancer Brother        type unknown    Social History Social History   Tobacco Use   Smoking status: Every Day    Packs/day: 1.00    Years: 20.00    Total pack years: 20.00    Types: Cigarettes   Smokeless tobacco: Never  Substance Use Topics   Alcohol use: No   Drug use: No     Allergies   Patient has no known allergies.   Review of Systems Review of Systems  Constitutional:  Positive for chills and fever.  HENT:  Positive for congestion.   Respiratory:  Positive for cough.   Neurological:  Positive for dizziness.  Psychiatric/Behavioral:  Positive for confusion.      Physical Exam Triage Vital Signs ED Triage Vitals  Enc Vitals Group     BP 10/01/22 1830 125/84     Pulse Rate 10/01/22 1830 (!) 133     Resp 10/01/22 1830 (!) 24     Temp 10/01/22 1830 (!) 101 F (38.3 C)     Temp Source 10/01/22 1830 Oral     SpO2 10/01/22 1830 (!) 88 %     Weight --      Height --      Head Circumference --  Peak Flow --      Pain Score 10/01/22 1836 5     Pain Loc --      Pain Edu? --      Excl. in GC? --    No data found.  Updated Vital Signs BP 125/84 (BP Location: Right Arm)   Pulse (!) 133   Temp (!) 101 F (38.3 C) (Oral)   Resp (!) 24   SpO2 94%   Visual Acuity Right Eye Distance:   Left Eye Distance:   Bilateral Distance:    Right Eye Near:   Left Eye Near:    Bilateral Near:     Physical Exam Vitals and nursing note reviewed.  Constitutional:      Appearance: He is ill-appearing and diaphoretic.  HENT:     Head: Normocephalic and atraumatic.  Eyes:     Pupils: Pupils are equal, round, and reactive to light.  Cardiovascular:     Rate and Rhythm: Tachycardia present.     Heart sounds: Normal heart sounds.  Pulmonary:     Effort: No respiratory distress.     Breath sounds: Normal breath sounds.     Comments: Tachypnea at 24 respirations.  Oxygen  saturation on room air was 88% Skin:    General: Skin is warm.  Neurological:     General: No focal deficit present.     Mental Status: He is alert. He is disoriented.  Psychiatric:        Mood and Affect: Mood normal.        Behavior: Behavior normal.      UC Treatments / Results  Labs (all labs ordered are listed, but only abnormal results are displayed) Labs Reviewed  POCT URINALYSIS DIP (MANUAL ENTRY) - Abnormal; Notable for the following components:      Result Value   Ketones, POC UA trace (5) (*)    Blood, UA moderate (*)    Protein Ur, POC >=300 (*)    All other components within normal limits    EKG   Radiology No results found.  Procedures Procedures (including critical care time)  Medications Ordered in UC Medications  acetaminophen (TYLENOL) tablet 650 mg (650 mg Oral Given 10/01/22 1847)    Initial Impression / Assessment and Plan / UC Course  I have reviewed the triage vital signs and the nursing notes.  Pertinent labs & imaging results that were available during my care of the patient were reviewed by me and considered in my medical decision making (see chart for details).     Reviewed exam and symptoms at length with patient and granddaughter Patient given Tylenol in clinic for fever UA with moderate blood but no other signs of infection Patient hypoxic at 88% on room air on arrival.  Placed on 5 L nasal cannula with oxygen saturation at 94% Given tachycardia, fever, hypoxia concern for sepsis.  Advised EMS transfer to ER for further treatment and patient and granddaughter agreement with plan.  Patient taken via EMS to emergency room. Final Clinical Impressions(s) / UC Diagnoses   Final diagnoses:  Fever, unspecified  Altered mental status, unspecified altered mental status type  Hypoxia   Discharge Instructions   None    ED Prescriptions   None    PDMP not reviewed this encounter.   Radford Pax, NP 10/01/22 1859    Radford Pax, NP 10/01/22 1901

## 2022-10-01 NOTE — ED Triage Notes (Addendum)
Pt BIB EMS from UC, Pt has fever, chills, and cough x 3 days. Pt was given Tylenol at [facility].

## 2022-10-01 NOTE — ED Triage Notes (Signed)
Pt family at bedside describes pt having a recent respiratory infection. Starting today pt has become progressively confused, loss of balance, loss of continence.   Spo2 88% in triage RN initiated nasal canula provider notified.   Pt not best historian of current meds

## 2022-10-01 NOTE — Discharge Instructions (Signed)
To ER via EMS 

## 2022-10-01 NOTE — ED Notes (Signed)
Patient is being discharged from the Urgent Care and sent to the Emergency Department via [facility] . Per Provider Cheri Rous, patient is in need of higher level of care due to Hypoxia, Fever, Tachycardia, Altered Mental Status . Patient is aware and verbalizes understanding of plan of care.    Vitals:   10/01/22 1830 10/01/22 1838  BP: 125/84   Pulse: (!) 133   Resp: (!) 24   Temp: (!) 101 F (38.3 C)   SpO2: (!) 88% 94%

## 2022-10-02 LAB — CBC WITH DIFFERENTIAL/PLATELET
Abs Immature Granulocytes: 0.04 10*3/uL (ref 0.00–0.07)
Basophils Absolute: 0 10*3/uL (ref 0.0–0.1)
Basophils Relative: 0 %
Eosinophils Absolute: 0 10*3/uL (ref 0.0–0.5)
Eosinophils Relative: 0 %
HCT: 35.3 % — ABNORMAL LOW (ref 39.0–52.0)
Hemoglobin: 11.6 g/dL — ABNORMAL LOW (ref 13.0–17.0)
Immature Granulocytes: 0 %
Lymphocytes Relative: 8 %
Lymphs Abs: 0.8 10*3/uL (ref 0.7–4.0)
MCH: 27.4 pg (ref 26.0–34.0)
MCHC: 32.9 g/dL (ref 30.0–36.0)
MCV: 83.3 fL (ref 80.0–100.0)
Monocytes Absolute: 1.2 10*3/uL — ABNORMAL HIGH (ref 0.1–1.0)
Monocytes Relative: 12 %
Neutro Abs: 7.5 10*3/uL (ref 1.7–7.7)
Neutrophils Relative %: 80 %
Platelets: 181 10*3/uL (ref 150–400)
RBC: 4.24 MIL/uL (ref 4.22–5.81)
RDW: 14.7 % (ref 11.5–15.5)
WBC: 9.5 10*3/uL (ref 4.0–10.5)
nRBC: 0 % (ref 0.0–0.2)

## 2022-10-02 LAB — BASIC METABOLIC PANEL
Anion gap: 9 (ref 5–15)
BUN: 25 mg/dL — ABNORMAL HIGH (ref 6–20)
CO2: 22 mmol/L (ref 22–32)
Calcium: 8.2 mg/dL — ABNORMAL LOW (ref 8.9–10.3)
Chloride: 98 mmol/L (ref 98–111)
Creatinine, Ser: 1.36 mg/dL — ABNORMAL HIGH (ref 0.61–1.24)
GFR, Estimated: 60 mL/min — ABNORMAL LOW (ref >60)
Glucose, Bld: 101 mg/dL — ABNORMAL HIGH (ref 70–99)
Potassium: 4 mmol/L (ref 3.5–5.1)
Sodium: 129 mmol/L — ABNORMAL LOW (ref 135–145)

## 2022-10-02 MED ORDER — SODIUM CHLORIDE 0.9 % IV BOLUS
1000.0000 mL | Freq: Once | INTRAVENOUS | Status: AC
Start: 2022-10-02 — End: 2022-10-02
  Administered 2022-10-02: 1000 mL via INTRAVENOUS

## 2022-10-02 MED ORDER — ACETAMINOPHEN 500 MG PO TABS: 500.0000 mg | ORAL_TABLET | Freq: Four times a day (QID) | ORAL | 0 refills | Status: DC | PRN

## 2022-10-02 MED ORDER — BENZONATATE 100 MG PO CAPS: 100.0000 mg | ORAL_CAPSULE | Freq: Three times a day (TID) | ORAL | 0 refills | Status: DC

## 2022-10-02 MED ORDER — BENZONATATE 100 MG PO CAPS
100.0000 mg | ORAL_CAPSULE | Freq: Once | ORAL | Status: AC
  Administered 2022-10-02: 100 mg via ORAL
  Filled 2022-10-02: qty 1

## 2022-10-02 MED ORDER — SODIUM CHLORIDE 0.9 % IV BOLUS
1000.0000 mL | Freq: Once | INTRAVENOUS | Status: AC
  Administered 2022-10-02: 1000 mL via INTRAVENOUS

## 2022-10-02 MED ORDER — ONDANSETRON HCL 4 MG/2ML IJ SOLN
4.0000 mg | Freq: Once | INTRAMUSCULAR | Status: AC
  Administered 2022-10-02: 4 mg via INTRAVENOUS
  Filled 2022-10-02: qty 2

## 2022-10-02 MED ORDER — IBUPROFEN 600 MG PO TABS: 600.0000 mg | ORAL_TABLET | Freq: Four times a day (QID) | ORAL | 0 refills | Status: DC | PRN

## 2022-10-02 MED ORDER — KETOROLAC TROMETHAMINE 15 MG/ML IJ SOLN
15.0000 mg | Freq: Once | INTRAMUSCULAR | Status: AC
  Administered 2022-10-02: 15 mg via INTRAVENOUS
  Filled 2022-10-02: qty 1

## 2022-10-02 MED ORDER — ONDANSETRON HCL 4 MG PO TABS: 4.0000 mg | ORAL_TABLET | Freq: Four times a day (QID) | ORAL | 0 refills | Status: DC

## 2022-10-02 NOTE — Discharge Instructions (Signed)
You came to the emergency department today due to fever, chills, cough and other flulike symptoms.  You tested positive for the flu.  You were treated with IV fluids, pain medication, nausea medication and medication for cough.  I have sent the nausea medication and cough medication to your pharmacy.  Tylenol and ibuprofen are good options for your fever and bodyaches.  There are also medications such as DayQuil and NyQuil which are safe options.  It was a pleasure to meet you and we hope you feel better.  Please do not hesitate to return to the emergency department, especially for any chest pain, shortness of breath, dizziness, loss of consciousness or any further concerns.

## 2022-10-02 NOTE — ED Notes (Signed)
Resting spo2 100% on room air With ambulation spo2 remained above 95% on room air Apon resting pt returned to 97% on room air

## 2022-10-02 NOTE — ED Provider Notes (Signed)
[facility]-EMERGENCY DEPT Provider Note   CSN: [id number] Arrival date & time: 10/01/22  1939     History  Chief Complaint  Patient presents with   Fever   Chills   Cough    Damon [name] is a 61 y.o. male presenting with URI symptoms for 3 days.  Inclusive of subjective fevers, chills, body aches, cough and itchy throat.  He is unsure if he has been around sick contacts.  No shortness of breath or chest pain.  He was with his granddaughter at the store and she had some concern that he seemed off balance.  He is also endorsing nausea and diarrhea.  Fever Associated symptoms: cough, diarrhea, ear pain, myalgias, nausea, sore throat and vomiting   Associated symptoms: no chest pain   Cough Associated symptoms: ear pain, fever, myalgias and sore throat   Associated symptoms: no chest pain and no shortness of breath        Home Medications Prior to Admission medications   Medication Sig Start Date End Date Taking? Authorizing Provider  amLODipine (NORVASC) 5 MG tablet Take 1 tablet by mouth daily. 10/21/17   [provider]  atorvastatin (LIPITOR) 10 MG tablet Take 1 tablet by mouth daily. 11/23/17   [provider]  pantoprazole (PROTONIX) 40 MG tablet Take 1 tablet by mouth daily. 10/21/17   [provider]      Allergies    Patient has no known allergies.    Review of Systems   Review of Systems  Constitutional:  Positive for fever.  HENT:  Positive for ear pain and sore throat.   Respiratory:  Positive for cough. Negative for shortness of breath.   Cardiovascular:  Negative for chest pain and palpitations.  Gastrointestinal:  Positive for diarrhea, nausea and vomiting.  Musculoskeletal:  Positive for arthralgias and myalgias.    Physical Exam Updated Vital Signs BP 120/87 (BP Location: Right Arm)   Pulse (!) 110   Temp 98.9 F (37.2 C) (Oral)   Resp 18   Ht 5\' 4"  (1.626 m)   Wt 64.9 kg   SpO2 94%   BMI 24.56 kg/m   Physical Exam Vitals and nursing note reviewed.  Constitutional:      General: He is not in acute distress.    Appearance: Normal appearance. He is not ill-appearing or diaphoretic.  HENT:     Head: Normocephalic and atraumatic.  Eyes:     General: Scleral icterus present.     Conjunctiva/sclera: Conjunctivae normal.  Cardiovascular:     Rate and Rhythm: Normal rate and regular rhythm.  Pulmonary:     Effort: Pulmonary effort is normal. No respiratory distress.     Breath sounds: No wheezing or rales.  Abdominal:     General: Abdomen is flat.     Palpations: Abdomen is soft.     Tenderness: There is abdominal tenderness (Generalized).  Skin:    General: Skin is warm and dry.     Findings: No rash.  Neurological:     Mental Status: He is alert.  Psychiatric:        Mood and Affect: Mood normal.        Behavior: Behavior normal.     ED Results / Procedures / Treatments   Labs (all labs ordered are listed, but only abnormal results are displayed) Labs Reviewed  RESP PANEL BY RT-PCR (RSV, FLU A&B, COVID)  RVPGX2 - Abnormal; Notable for the following components:      Result  Value   Influenza A by PCR POSITIVE (*)    All other components within normal limits    EKG None  Radiology No results found.  Procedures Procedures   Medications Ordered in ED Medications  ketorolac (TORADOL) 15 MG/ML injection 15 mg (15 mg Intravenous Given 10/02/22 0331)  sodium chloride 0.9 % bolus 1,000 mL (1,000 mLs Intravenous New Bag/Given 10/02/22 0332)  ondansetron (ZOFRAN) injection 4 mg (4 mg Intravenous Given 10/02/22 0331)  benzonatate (TESSALON) capsule 100 mg (100 mg Oral Given 10/02/22 0332)  sodium chloride 0.9 % bolus 1,000 mL (0 mLs Intravenous Stopped 10/02/22 0518)    ED Course/ Medical Decision Making/ A&P Clinical Course as of 10/02/22 7867  Tue Oct 02, 2022  0605 Re-evaluated and reports feeling much better [MR]    Clinical Course User Index [MR] Aalyssa Elderkin,  Gabriel Cirri, PA-C                           Medical Decision Making Amount and/or Complexity of Data Reviewed Labs: ordered.  Risk OTC drugs. Prescription drug management.   61 year old male presenting today with 3 days worth of URI symptoms.  He was sent from urgent care due to his tachycardia and ambulatory O2 saturation of 88%.  When he arrives to the emergency department he is satting in the 90s.  Continues to be mildly tachycardic in the 100s.  Flu positive from triage.  Per chart review patient was allegedly altered at urgent care.  When patient was asked about this he says that his granddaughter was concerned that "I was not acting like myself."  Alert and oriented on my exam.    I ordered basic labs which were unremarkable outside of a sodium of 129 that was repleted with IVF. Likely low 2/2 decreased PO intake and diarrhea. Patient also has an AKI with Cr of 1.36. likely improved with bolusx2.  Lung sounds clear.  Patient is not diaphoretic or complaining of chest pain or shortness of breath.  I suspect his abdominal pain, nausea and diarrhea all to be secondary to the flu.  He was given Toradol, Zofran, IV fluids and benzonatate for his symptoms.  On reevaluation patient reports feeling much better.  Prior to discharge patient ambulated in the department with an O2 saturation 95% and up.  At this time patient does not appear to have an emergent condition requiring further invention today.  I suspect his symptoms to all be secondary to the flu.  We discussed proper over-the-counter medications and he will follow-up with a PCP.  He has not established but was given multiple offices.   Final Clinical Impression(s) / ED Diagnoses Final diagnoses:  Flu    Rx / DC Orders ED Discharge Orders     None      Results and diagnoses were explained to the patient. Return precautions discussed in full. Patient had no additional questions and expressed complete understanding.   This  chart was dictated using voice recognition software.  Despite best efforts to proofread,  errors can occur which can change the documentation meaning.     Woodroe Chen 10/02/22 6720    Dione Booze, MD 10/02/22 [id number]

## 2022-10-03 ENCOUNTER — Inpatient Hospital Stay (HOSPITAL_COMMUNITY): Payer: Medicare Other

## 2022-10-03 ENCOUNTER — Other Ambulatory Visit: Payer: Self-pay

## 2022-10-03 ENCOUNTER — Encounter (HOSPITAL_COMMUNITY): Payer: Self-pay

## 2022-10-03 ENCOUNTER — Emergency Department (HOSPITAL_COMMUNITY): Payer: Medicare Other

## 2022-10-03 ENCOUNTER — Inpatient Hospital Stay (HOSPITAL_COMMUNITY)
Admission: EM | Admit: 2022-10-03 | Discharge: 2023-01-22 | DRG: 004 | Disposition: A | Discharge status: Skilled Nursing Facility | Payer: Medicare Other | Attending: Internal Medicine | Admitting: Internal Medicine

## 2022-10-03 DIAGNOSIS — J101 Influenza due to other identified influenza virus with other respiratory manifestations: Secondary | ICD-10-CM | POA: Diagnosis not present

## 2022-10-03 DIAGNOSIS — R64 Cachexia: Secondary | ICD-10-CM | POA: Diagnosis present

## 2022-10-03 DIAGNOSIS — E871 Hypo-osmolality and hyponatremia: Secondary | ICD-10-CM | POA: Diagnosis not present

## 2022-10-03 DIAGNOSIS — E870 Hyperosmolality and hypernatremia: Secondary | ICD-10-CM | POA: Diagnosis not present

## 2022-10-03 DIAGNOSIS — G253 Myoclonus: Secondary | ICD-10-CM | POA: Diagnosis not present

## 2022-10-03 DIAGNOSIS — F17210 Nicotine dependence, cigarettes, uncomplicated: Secondary | ICD-10-CM | POA: Diagnosis present

## 2022-10-03 DIAGNOSIS — R9431 Abnormal electrocardiogram [ECG] [EKG]: Secondary | ICD-10-CM | POA: Diagnosis not present

## 2022-10-03 DIAGNOSIS — J13 Pneumonia due to Streptococcus pneumoniae: Secondary | ICD-10-CM | POA: Diagnosis present

## 2022-10-03 DIAGNOSIS — J189 Pneumonia, unspecified organism: Secondary | ICD-10-CM | POA: Insufficient documentation

## 2022-10-03 DIAGNOSIS — I469 Cardiac arrest, cause unspecified: Secondary | ICD-10-CM | POA: Insufficient documentation

## 2022-10-03 DIAGNOSIS — Z681 Body mass index (BMI) 19 or less, adult: Secondary | ICD-10-CM | POA: Diagnosis not present

## 2022-10-03 DIAGNOSIS — L89152 Pressure ulcer of sacral region, stage 2: Secondary | ICD-10-CM | POA: Diagnosis not present

## 2022-10-03 DIAGNOSIS — E162 Hypoglycemia, unspecified: Secondary | ICD-10-CM | POA: Diagnosis not present

## 2022-10-03 DIAGNOSIS — F17200 Nicotine dependence, unspecified, uncomplicated: Secondary | ICD-10-CM

## 2022-10-03 DIAGNOSIS — R4182 Altered mental status, unspecified: Secondary | ICD-10-CM | POA: Diagnosis not present

## 2022-10-03 DIAGNOSIS — Z8249 Family history of ischemic heart disease and other diseases of the circulatory system: Secondary | ICD-10-CM

## 2022-10-03 DIAGNOSIS — N170 Acute kidney failure with tubular necrosis: Secondary | ICD-10-CM | POA: Diagnosis not present

## 2022-10-03 DIAGNOSIS — Z66 Do not resuscitate: Secondary | ICD-10-CM | POA: Diagnosis not present

## 2022-10-03 DIAGNOSIS — Z833 Family history of diabetes mellitus: Secondary | ICD-10-CM

## 2022-10-03 DIAGNOSIS — J1008 Influenza due to other identified influenza virus with other specified pneumonia: Secondary | ICD-10-CM | POA: Diagnosis present

## 2022-10-03 DIAGNOSIS — E43 Unspecified severe protein-calorie malnutrition: Secondary | ICD-10-CM | POA: Diagnosis present

## 2022-10-03 DIAGNOSIS — D649 Anemia, unspecified: Secondary | ICD-10-CM | POA: Diagnosis not present

## 2022-10-03 DIAGNOSIS — A411 Sepsis due to other specified staphylococcus: Secondary | ICD-10-CM | POA: Diagnosis present

## 2022-10-03 DIAGNOSIS — Z79899 Other long term (current) drug therapy: Secondary | ICD-10-CM

## 2022-10-03 DIAGNOSIS — I10 Essential (primary) hypertension: Secondary | ICD-10-CM | POA: Diagnosis not present

## 2022-10-03 DIAGNOSIS — M549 Dorsalgia, unspecified: Secondary | ICD-10-CM | POA: Diagnosis present

## 2022-10-03 DIAGNOSIS — E785 Hyperlipidemia, unspecified: Secondary | ICD-10-CM | POA: Insufficient documentation

## 2022-10-03 DIAGNOSIS — B370 Candidal stomatitis: Secondary | ICD-10-CM | POA: Diagnosis not present

## 2022-10-03 DIAGNOSIS — A419 Sepsis, unspecified organism: Secondary | ICD-10-CM

## 2022-10-03 DIAGNOSIS — I110 Hypertensive heart disease with heart failure: Secondary | ICD-10-CM | POA: Diagnosis present

## 2022-10-03 DIAGNOSIS — J151 Pneumonia due to Pseudomonas: Secondary | ICD-10-CM | POA: Diagnosis not present

## 2022-10-03 DIAGNOSIS — Z7189 Other specified counseling: Secondary | ICD-10-CM | POA: Diagnosis not present

## 2022-10-03 DIAGNOSIS — R509 Fever, unspecified: Secondary | ICD-10-CM | POA: Diagnosis not present

## 2022-10-03 DIAGNOSIS — J80 Acute respiratory distress syndrome: Secondary | ICD-10-CM | POA: Diagnosis present

## 2022-10-03 DIAGNOSIS — R6521 Severe sepsis with septic shock: Secondary | ICD-10-CM | POA: Diagnosis present

## 2022-10-03 DIAGNOSIS — G8929 Other chronic pain: Secondary | ICD-10-CM | POA: Diagnosis present

## 2022-10-03 DIAGNOSIS — E861 Hypovolemia: Secondary | ICD-10-CM | POA: Diagnosis not present

## 2022-10-03 DIAGNOSIS — J9690 Respiratory failure, unspecified, unspecified whether with hypoxia or hypercapnia: Secondary | ICD-10-CM | POA: Diagnosis not present

## 2022-10-03 DIAGNOSIS — Z800 Family history of malignant neoplasm of digestive organs: Secondary | ICD-10-CM

## 2022-10-03 DIAGNOSIS — Z1152 Encounter for screening for COVID-19: Secondary | ICD-10-CM

## 2022-10-03 DIAGNOSIS — Y95 Nosocomial condition: Secondary | ICD-10-CM | POA: Diagnosis not present

## 2022-10-03 DIAGNOSIS — J989 Respiratory disorder, unspecified: Secondary | ICD-10-CM

## 2022-10-03 DIAGNOSIS — Z801 Family history of malignant neoplasm of trachea, bronchus and lung: Secondary | ICD-10-CM

## 2022-10-03 DIAGNOSIS — Z9911 Dependence on respirator [ventilator] status: Secondary | ICD-10-CM

## 2022-10-03 DIAGNOSIS — J690 Pneumonitis due to inhalation of food and vomit: Secondary | ICD-10-CM | POA: Diagnosis not present

## 2022-10-03 DIAGNOSIS — R000 Tachycardia, unspecified: Secondary | ICD-10-CM | POA: Diagnosis not present

## 2022-10-03 DIAGNOSIS — L8990 Pressure ulcer of unspecified site, unspecified stage: Secondary | ICD-10-CM | POA: Insufficient documentation

## 2022-10-03 DIAGNOSIS — G931 Anoxic brain damage, not elsewhere classified: Secondary | ICD-10-CM | POA: Diagnosis not present

## 2022-10-03 DIAGNOSIS — E440 Moderate protein-calorie malnutrition: Secondary | ICD-10-CM | POA: Insufficient documentation

## 2022-10-03 DIAGNOSIS — G928 Other toxic encephalopathy: Secondary | ICD-10-CM | POA: Diagnosis not present

## 2022-10-03 DIAGNOSIS — Z720 Tobacco use: Secondary | ICD-10-CM | POA: Insufficient documentation

## 2022-10-03 DIAGNOSIS — E781 Pure hyperglyceridemia: Secondary | ICD-10-CM | POA: Diagnosis present

## 2022-10-03 DIAGNOSIS — L89150 Pressure ulcer of sacral region, unstageable: Secondary | ICD-10-CM | POA: Diagnosis not present

## 2022-10-03 DIAGNOSIS — A498 Other bacterial infections of unspecified site: Secondary | ICD-10-CM | POA: Diagnosis not present

## 2022-10-03 DIAGNOSIS — Z91199 Patient's noncompliance with other medical treatment and regimen due to unspecified reason: Secondary | ICD-10-CM

## 2022-10-03 DIAGNOSIS — Z930 Tracheostomy status: Secondary | ICD-10-CM | POA: Diagnosis not present

## 2022-10-03 DIAGNOSIS — J9601 Acute respiratory failure with hypoxia: Principal | ICD-10-CM | POA: Diagnosis present

## 2022-10-03 DIAGNOSIS — G908 Other disorders of autonomic nervous system: Secondary | ICD-10-CM | POA: Diagnosis not present

## 2022-10-03 DIAGNOSIS — R6510 Systemic inflammatory response syndrome (SIRS) of non-infectious origin without acute organ dysfunction: Secondary | ICD-10-CM | POA: Insufficient documentation

## 2022-10-03 DIAGNOSIS — R54 Age-related physical debility: Secondary | ICD-10-CM | POA: Diagnosis not present

## 2022-10-03 DIAGNOSIS — Z7401 Bed confinement status: Secondary | ICD-10-CM

## 2022-10-03 DIAGNOSIS — R1310 Dysphagia, unspecified: Secondary | ICD-10-CM | POA: Diagnosis not present

## 2022-10-03 DIAGNOSIS — R403 Persistent vegetative state: Secondary | ICD-10-CM | POA: Diagnosis not present

## 2022-10-03 DIAGNOSIS — J1100 Influenza due to unidentified influenza virus with unspecified type of pneumonia: Secondary | ICD-10-CM

## 2022-10-03 DIAGNOSIS — E875 Hyperkalemia: Secondary | ICD-10-CM | POA: Diagnosis not present

## 2022-10-03 DIAGNOSIS — L89812 Pressure ulcer of head, stage 2: Secondary | ICD-10-CM | POA: Diagnosis not present

## 2022-10-03 DIAGNOSIS — I468 Cardiac arrest due to other underlying condition: Secondary | ICD-10-CM

## 2022-10-03 DIAGNOSIS — R569 Unspecified convulsions: Secondary | ICD-10-CM | POA: Diagnosis not present

## 2022-10-03 DIAGNOSIS — R739 Hyperglycemia, unspecified: Secondary | ICD-10-CM | POA: Diagnosis not present

## 2022-10-03 DIAGNOSIS — K5900 Constipation, unspecified: Secondary | ICD-10-CM | POA: Diagnosis not present

## 2022-10-03 DIAGNOSIS — R7989 Other specified abnormal findings of blood chemistry: Secondary | ICD-10-CM | POA: Insufficient documentation

## 2022-10-03 DIAGNOSIS — Z515 Encounter for palliative care: Secondary | ICD-10-CM | POA: Diagnosis not present

## 2022-10-03 HISTORY — DX: Tobacco use: Z72.0

## 2022-10-03 LAB — CBC
HCT: 35.8 % — ABNORMAL LOW (ref 39.0–52.0)
Hemoglobin: 11.6 g/dL — ABNORMAL LOW (ref 13.0–17.0)
MCH: 27.4 pg (ref 26.0–34.0)
MCHC: 32.4 g/dL (ref 30.0–36.0)
MCV: 84.4 fL (ref 80.0–100.0)
Platelets: 162 10*3/uL (ref 150–400)
RBC: 4.24 MIL/uL (ref 4.22–5.81)
RDW: 15.2 % (ref 11.5–15.5)
WBC: 2 10*3/uL — ABNORMAL LOW (ref 4.0–10.5)
nRBC: 0 % (ref 0.0–0.2)

## 2022-10-03 LAB — COMPREHENSIVE METABOLIC PANEL
ALT: 35 U/L (ref 0–44)
AST: 82 U/L — ABNORMAL HIGH (ref 15–41)
Albumin: 2.4 g/dL — ABNORMAL LOW (ref 3.5–5.0)
Alkaline Phosphatase: 48 U/L (ref 38–126)
Anion gap: 10 (ref 5–15)
BUN: 19 mg/dL (ref 6–20)
CO2: 19 mmol/L — ABNORMAL LOW (ref 22–32)
Calcium: 7.6 mg/dL — ABNORMAL LOW (ref 8.9–10.3)
Chloride: 105 mmol/L (ref 98–111)
Creatinine, Ser: 0.98 mg/dL (ref 0.61–1.24)
GFR, Estimated: >60 mL/min (ref >60)
Glucose, Bld: 93 mg/dL (ref 70–99)
Potassium: 3.8 mmol/L (ref 3.5–5.1)
Sodium: 134 mmol/L — ABNORMAL LOW (ref 135–145)
Total Bilirubin: 1 mg/dL (ref 0.3–1.2)
Total Protein: 5.7 g/dL — ABNORMAL LOW (ref 6.5–8.1)

## 2022-10-03 LAB — BLOOD GAS, ARTERIAL
Acid-base deficit: 9.8 mmol/L — ABNORMAL HIGH (ref 0.0–2.0)
Acid-base deficit: 8 mmol/L — ABNORMAL HIGH (ref 0.0–2.0)
Acid-base deficit: 6.5 mmol/L — ABNORMAL HIGH (ref 0.0–2.0)
Bicarbonate: 17.6 mmol/L — ABNORMAL LOW (ref 20.0–28.0)
Bicarbonate: 20.6 mmol/L (ref 20.0–28.0)
Bicarbonate: 20.6 mmol/L (ref 20.0–28.0)
Drawn by: 560031
Drawn by: 560031
MECHVT: 470 mL
MECHVT: 470 mL
O2 Saturation: 89.8 %
O2 Saturation: 90 %
O2 Saturation: 100 %
PEEP: 10 cmH2O
PEEP: 10 cmH2O
Patient temperature: 36.9
Patient temperature: 36.9
Patient temperature: 36.4
RATE: 20 resp/min
RATE: 32 resp/min
pCO2 arterial: 43 mmHg (ref 32–48)
pCO2 arterial: 54 mmHg — ABNORMAL HIGH (ref 32–48)
pCO2 arterial: 45 mmHg (ref 32–48)
pH, Arterial: 7.22 — ABNORMAL LOW (ref 7.35–7.45)
pH, Arterial: 7.19 — CL (ref 7.35–7.45)
pH, Arterial: 7.27 — ABNORMAL LOW (ref 7.35–7.45)
pO2, Arterial: 63 mmHg — ABNORMAL LOW (ref 83–108)
pO2, Arterial: 66 mmHg — ABNORMAL LOW (ref 83–108)
pO2, Arterial: 85 mmHg (ref 83–108)

## 2022-10-03 LAB — TROPONIN I (HIGH SENSITIVITY)
Troponin I (High Sensitivity): 110 ng/L (ref <18)
Troponin I (High Sensitivity): 91 ng/L — ABNORMAL HIGH (ref <18)
Troponin I (High Sensitivity): 70 ng/L — ABNORMAL HIGH (ref <18)

## 2022-10-03 LAB — HEPATITIS PANEL, ACUTE
HCV Ab: NONREACTIVE
Hep A IgM: NONREACTIVE
Hep B C IgM: NONREACTIVE
Hepatitis B Surface Ag: NONREACTIVE

## 2022-10-03 LAB — POCT I-STAT 7, (LYTES, BLD GAS, ICA,H+H)
Acid-base deficit: 8 mmol/L — ABNORMAL HIGH (ref 0.0–2.0)
Bicarbonate: 20.6 mmol/L (ref 20.0–28.0)
Calcium, Ion: 1.19 mmol/L (ref 1.15–1.40)
HCT: 37 % — ABNORMAL LOW (ref 39.0–52.0)
Hemoglobin: 12.6 g/dL — ABNORMAL LOW (ref 13.0–17.0)
O2 Saturation: 85 %
Potassium: 4.4 mmol/L (ref 3.5–5.1)
Sodium: 137 mmol/L (ref 135–145)
TCO2: 22 mmol/L (ref 22–32)
pCO2 arterial: 53.6 mmHg — ABNORMAL HIGH (ref 32–48)
pH, Arterial: 7.192 — CL (ref 7.35–7.45)
pO2, Arterial: 63 mmHg — ABNORMAL LOW (ref 83–108)

## 2022-10-03 LAB — CBC WITH DIFFERENTIAL/PLATELET
Abs Immature Granulocytes: 0.04 10*3/uL (ref 0.00–0.07)
Basophils Absolute: 0 10*3/uL (ref 0.0–0.1)
Basophils Relative: 1 %
Eosinophils Absolute: 0 10*3/uL (ref 0.0–0.5)
Eosinophils Relative: 0 %
HCT: 36.2 % — ABNORMAL LOW (ref 39.0–52.0)
Hemoglobin: 12.1 g/dL — ABNORMAL LOW (ref 13.0–17.0)
Immature Granulocytes: 2 %
Lymphocytes Relative: 10 %
Lymphs Abs: 0.3 10*3/uL — ABNORMAL LOW (ref 0.7–4.0)
MCH: 27.4 pg (ref 26.0–34.0)
MCHC: 33.4 g/dL (ref 30.0–36.0)
MCV: 82.1 fL (ref 80.0–100.0)
Monocytes Absolute: 0.1 10*3/uL (ref 0.1–1.0)
Monocytes Relative: 3 %
Neutro Abs: 2.2 10*3/uL (ref 1.7–7.7)
Neutrophils Relative %: 84 %
Platelets: 154 10*3/uL (ref 150–400)
RBC: 4.41 MIL/uL (ref 4.22–5.81)
RDW: 14.8 % (ref 11.5–15.5)
WBC: 2.6 10*3/uL — ABNORMAL LOW (ref 4.0–10.5)
nRBC: 0 % (ref 0.0–0.2)

## 2022-10-03 LAB — URINALYSIS, ROUTINE W REFLEX MICROSCOPIC
Bilirubin Urine: NEGATIVE
Glucose, UA: NEGATIVE mg/dL
Ketones, ur: 20 mg/dL — AB
Leukocytes,Ua: NEGATIVE
Nitrite: NEGATIVE
Protein, ur: 100 mg/dL — AB
Specific Gravity, Urine: 1.017 (ref 1.005–1.030)
pH: 5 (ref 5.0–8.0)

## 2022-10-03 LAB — BASIC METABOLIC PANEL
Anion gap: 11 (ref 5–15)
BUN: 18 mg/dL (ref 6–20)
CO2: 19 mmol/L — ABNORMAL LOW (ref 22–32)
Calcium: 7.3 mg/dL — ABNORMAL LOW (ref 8.9–10.3)
Chloride: 109 mmol/L (ref 98–111)
Creatinine, Ser: 1.24 mg/dL (ref 0.61–1.24)
GFR, Estimated: >60 mL/min (ref >60)
Glucose, Bld: 80 mg/dL (ref 70–99)
Potassium: 3.6 mmol/L (ref 3.5–5.1)
Sodium: 139 mmol/L (ref 135–145)

## 2022-10-03 LAB — HIV ANTIBODY (ROUTINE TESTING W REFLEX): HIV Screen 4th Generation wRfx: NONREACTIVE

## 2022-10-03 LAB — CBG MONITORING, ED: Glucose-Capillary: 70 mg/dL (ref 70–99)

## 2022-10-03 LAB — MAGNESIUM: Magnesium: 1.9 mg/dL (ref 1.7–2.4)

## 2022-10-03 LAB — BLOOD GAS, VENOUS
Acid-base deficit: 3.7 mmol/L — ABNORMAL HIGH (ref 0.0–2.0)
Bicarbonate: 20.4 mmol/L (ref 20.0–28.0)
O2 Saturation: 66.4 %
Patient temperature: 37
pCO2, Ven: 33 mmHg — ABNORMAL LOW (ref 44–60)
pH, Ven: 7.4 (ref 7.25–7.43)
pO2, Ven: 39 mmHg (ref 32–45)

## 2022-10-03 LAB — PROTIME-INR
INR: 1.2 (ref 0.8–1.2)
Prothrombin Time: 14.7 seconds (ref 11.4–15.2)

## 2022-10-03 LAB — POCT ACTIVATED CLOTTING TIME: Activated Clotting Time: 0 seconds

## 2022-10-03 LAB — GLUCOSE, CAPILLARY
Glucose-Capillary: 125 mg/dL — ABNORMAL HIGH (ref 70–99)
Glucose-Capillary: 104 mg/dL — ABNORMAL HIGH (ref 70–99)

## 2022-10-03 LAB — PROCALCITONIN: Procalcitonin: 7.05 ng/mL

## 2022-10-03 LAB — MRSA NEXT GEN BY PCR, NASAL: MRSA by PCR Next Gen: NOT DETECTED

## 2022-10-03 LAB — LACTIC ACID, PLASMA
Lactic Acid, Venous: 1.4 mmol/L (ref 0.5–1.9)
Lactic Acid, Venous: 5.8 mmol/L (ref 0.5–1.9)

## 2022-10-03 LAB — APTT: aPTT: 33 seconds (ref 24–36)

## 2022-10-03 LAB — STREP PNEUMONIAE URINARY ANTIGEN: Strep Pneumo Urinary Antigen: POSITIVE — AB

## 2022-10-03 LAB — PHOSPHORUS: Phosphorus: 6.5 mg/dL — ABNORMAL HIGH (ref 2.5–4.6)

## 2022-10-03 MED ORDER — SODIUM CHLORIDE 0.9 % IV SOLN: INTRAVENOUS | Status: DC | PRN

## 2022-10-03 MED ORDER — LACTATED RINGERS IV BOLUS (SEPSIS)
1000.0000 mL | Freq: Once | INTRAVENOUS | Status: AC
  Administered 2022-10-03: 1000 mL via INTRAVENOUS

## 2022-10-03 MED ORDER — SODIUM CHLORIDE 0.9 % IV SOLN: INTRAVENOUS | Status: DC

## 2022-10-03 MED ORDER — IBUPROFEN 200 MG PO TABS: 600.0000 mg | ORAL_TABLET | Freq: Four times a day (QID) | ORAL | Status: DC | PRN

## 2022-10-03 MED ORDER — VECURONIUM BROMIDE 10 MG IV SOLR
10.0000 mg | Freq: Once | INTRAVENOUS | Status: AC
  Administered 2022-10-03: 10 mg via INTRAVENOUS
  Filled 2022-10-03: qty 10

## 2022-10-03 MED ORDER — FAMOTIDINE 20 MG PO TABS: 20.0000 mg | ORAL_TABLET | Freq: Two times a day (BID) | ORAL | Status: DC

## 2022-10-03 MED ORDER — ACETAMINOPHEN 325 MG PO TABS: 650.0000 mg | ORAL_TABLET | Freq: Four times a day (QID) | ORAL | Status: DC | PRN

## 2022-10-03 MED ORDER — INSULIN ASPART 100 UNIT/ML IJ SOLN
0.0000 [IU] | INTRAMUSCULAR | Status: DC
  Administered 2022-10-03 – 2022-10-04 (×2): 2 [IU] via SUBCUTANEOUS
  Administered 2022-10-04: 3 [IU] via SUBCUTANEOUS
  Administered 2022-10-04: 2 [IU] via SUBCUTANEOUS
  Administered 2022-10-05: 3 [IU] via SUBCUTANEOUS
  Administered 2022-10-07 – 2022-10-13 (×8): 2 [IU] via SUBCUTANEOUS
  Filled 2022-10-03: qty 0.15

## 2022-10-03 MED ORDER — SODIUM CHLORIDE 0.9 % IV SOLN
500.0000 mg | Freq: Once | INTRAVENOUS | Status: AC
  Administered 2022-10-03: 500 mg via INTRAVENOUS
  Filled 2022-10-03: qty 5

## 2022-10-03 MED ORDER — VECURONIUM BROMIDE 10 MG IV SOLR
0.0000 ug/kg/min | Status: DC
  Administered 2022-10-03: 1 ug/kg/min via INTRAVENOUS
  Administered 2022-10-04: 0.6 ug/kg/min via INTRAVENOUS
  Filled 2022-10-03 (×3): qty 100

## 2022-10-03 MED ORDER — IPRATROPIUM-ALBUTEROL 0.5-2.5 (3) MG/3ML IN SOLN: 3.0000 mL | Freq: Four times a day (QID) | RESPIRATORY_TRACT | Status: DC

## 2022-10-03 MED ORDER — ONDANSETRON HCL 4 MG PO TABS: 4.0000 mg | ORAL_TABLET | Freq: Four times a day (QID) | ORAL | Status: DC | PRN

## 2022-10-03 MED ORDER — METRONIDAZOLE 500 MG/100ML IV SOLN
500.0000 mg | Freq: Two times a day (BID) | INTRAVENOUS | Status: DC
  Administered 2022-10-03 – 2022-10-04 (×3): 500 mg via INTRAVENOUS
  Filled 2022-10-03 (×3): qty 100

## 2022-10-03 MED ORDER — PIPERACILLIN-TAZOBACTAM 3.375 G IVPB
3.3750 g | Freq: Three times a day (TID) | INTRAVENOUS | Status: DC
  Administered 2022-10-03: 3.375 g via INTRAVENOUS
  Filled 2022-10-03: qty 50

## 2022-10-03 MED ORDER — GUAIFENESIN ER 600 MG PO TB12
1200.0000 mg | ORAL_TABLET | Freq: Two times a day (BID) | ORAL | Status: DC
  Filled 2022-10-03: qty 2

## 2022-10-03 MED ORDER — MIDAZOLAM BOLUS VIA INFUSION: 2.0000 mg | INTRAVENOUS | Status: DC | PRN

## 2022-10-03 MED ORDER — SODIUM CHLORIDE 0.9 % IV SOLN
1.0000 g | Freq: Every day | INTRAVENOUS | Status: DC
  Administered 2022-10-03: 1 g via INTRAVENOUS
  Filled 2022-10-03: qty 10

## 2022-10-03 MED ORDER — LACTATED RINGERS IV BOLUS
1000.0000 mL | Freq: Once | INTRAVENOUS | Status: AC
  Administered 2022-10-03: 1000 mL via INTRAVENOUS

## 2022-10-03 MED ORDER — SODIUM CHLORIDE 0.9 % IV SOLN: 2.0000 g | INTRAVENOUS | Status: DC

## 2022-10-03 MED ORDER — ENOXAPARIN SODIUM 40 MG/0.4ML IJ SOSY
40.0000 mg | PREFILLED_SYRINGE | INTRAMUSCULAR | Status: DC
  Administered 2022-10-04 – 2022-10-31 (×28): 40 mg via SUBCUTANEOUS
  Filled 2022-10-03 (×29): qty 0.4

## 2022-10-03 MED ORDER — LORAZEPAM 2 MG/ML IJ SOLN: 1.0000 mg | Freq: Once | INTRAMUSCULAR | Status: AC

## 2022-10-03 MED ORDER — SODIUM CHLORIDE 0.9 % IV SOLN
1.0000 g | Freq: Once | INTRAVENOUS | Status: AC
  Administered 2022-10-03: 1 g via INTRAVENOUS
  Filled 2022-10-03: qty 10

## 2022-10-03 MED ORDER — DOCUSATE SODIUM 50 MG/5ML PO LIQD
100.0000 mg | Freq: Two times a day (BID) | ORAL | Status: DC
  Administered 2022-10-04 – 2022-10-22 (×20): 100 mg
  Filled 2022-10-03 (×22): qty 10

## 2022-10-03 MED ORDER — POLYETHYLENE GLYCOL 3350 17 G PO PACK
17.0000 g | PACK | Freq: Every day | ORAL | Status: DC
  Administered 2022-10-04 – 2022-10-20 (×8): 17 g
  Filled 2022-10-03 (×10): qty 1

## 2022-10-03 MED ORDER — FENTANYL BOLUS VIA INFUSION
50.0000 ug | INTRAVENOUS | Status: DC | PRN
  Administered 2022-10-03 (×2): 100 ug via INTRAVENOUS
  Administered 2022-10-04: 75 ug via INTRAVENOUS
  Administered 2022-10-04: 50 ug via INTRAVENOUS
  Administered 2022-10-04: 75 ug via INTRAVENOUS

## 2022-10-03 MED ORDER — CHLORHEXIDINE GLUCONATE CLOTH 2 % EX PADS
6.0000 | MEDICATED_PAD | Freq: Every day | CUTANEOUS | Status: DC
  Administered 2022-10-03 – 2022-10-12 (×10): 6 via TOPICAL

## 2022-10-03 MED ORDER — IPRATROPIUM-ALBUTEROL 0.5-2.5 (3) MG/3ML IN SOLN
3.0000 mL | Freq: Once | RESPIRATORY_TRACT | Status: AC
  Administered 2022-10-03: 3 mL via RESPIRATORY_TRACT
  Filled 2022-10-03: qty 3

## 2022-10-03 MED ORDER — MIDAZOLAM-SODIUM CHLORIDE 100-0.9 MG/100ML-% IV SOLN
0.5000 mg/h | INTRAVENOUS | Status: DC
  Administered 2022-10-03: 0.5 mg/h via INTRAVENOUS
  Administered 2022-10-04: 9 mg/h via INTRAVENOUS
  Administered 2022-10-04: 10 mg/h via INTRAVENOUS
  Administered 2022-10-05: 8 mg/h via INTRAVENOUS
  Administered 2022-10-05: 10 mg/h via INTRAVENOUS
  Administered 2022-10-06: 8.5 mg/h via INTRAVENOUS
  Administered 2022-10-06: 8 mg/h via INTRAVENOUS
  Administered 2022-10-07 (×2): 9 mg/h via INTRAVENOUS
  Administered 2022-10-07: 8.5 mg/h via INTRAVENOUS
  Administered 2022-10-08: 10 mg/h via INTRAVENOUS
  Filled 2022-10-03 (×11): qty 100

## 2022-10-03 MED ORDER — ONDANSETRON HCL 4 MG/2ML IJ SOLN: 4.0000 mg | Freq: Four times a day (QID) | INTRAMUSCULAR | Status: DC | PRN

## 2022-10-03 MED ORDER — FENTANYL 2500MCG IN NS 250ML (10MCG/ML) PREMIX INFUSION
50.0000 ug/h | INTRAVENOUS | Status: DC
  Administered 2022-10-03: 50 ug/h via INTRAVENOUS
  Administered 2022-10-04 – 2022-10-05 (×4): 200 ug/h via INTRAVENOUS
  Filled 2022-10-03 (×4): qty 250

## 2022-10-03 MED ORDER — ACETAMINOPHEN 650 MG RE SUPP: 650.0000 mg | Freq: Four times a day (QID) | RECTAL | Status: DC | PRN

## 2022-10-03 MED ORDER — FENTANYL CITRATE PF 50 MCG/ML IJ SOSY
50.0000 ug | PREFILLED_SYRINGE | Freq: Once | INTRAMUSCULAR | Status: AC
  Administered 2022-10-03: 50 ug via INTRAVENOUS

## 2022-10-03 MED ORDER — ORAL CARE MOUTH RINSE
15.0000 mL | OROMUCOSAL | Status: DC
  Administered 2022-10-03 – 2022-10-06 (×31): 15 mL via OROMUCOSAL

## 2022-10-03 MED ORDER — SODIUM CHLORIDE 0.9 % IV SOLN
250.0000 mL | INTRAVENOUS | Status: DC
  Administered 2022-10-03 – 2022-12-13 (×7): 250 mL via INTRAVENOUS

## 2022-10-03 MED ORDER — LORAZEPAM 2 MG/ML IJ SOLN
INTRAMUSCULAR | Status: AC
  Administered 2022-10-03: 1 mg via INTRAVENOUS
  Filled 2022-10-03: qty 1

## 2022-10-03 MED ORDER — IPRATROPIUM-ALBUTEROL 0.5-2.5 (3) MG/3ML IN SOLN
3.0000 mL | Freq: Four times a day (QID) | RESPIRATORY_TRACT | Status: DC
  Administered 2022-10-03 – 2022-10-13 (×43): 3 mL via RESPIRATORY_TRACT
  Filled 2022-10-03 (×40): qty 3

## 2022-10-03 MED ORDER — ARTIFICIAL TEARS OPHTHALMIC OINT
1.0000 | TOPICAL_OINTMENT | Freq: Three times a day (TID) | OPHTHALMIC | Status: DC
  Administered 2022-10-03 – 2022-10-07 (×13): 1 via OPHTHALMIC
  Filled 2022-10-03: qty 3.5

## 2022-10-03 MED ORDER — VECURONIUM BOLUS VIA INFUSION
10.0000 mg | Freq: Once | INTRAVENOUS | Status: AC
  Administered 2022-10-03: 10 mg via INTRAVENOUS
  Filled 2022-10-03: qty 10

## 2022-10-03 MED ORDER — MIDAZOLAM HCL 2 MG/2ML IJ SOLN
1.0000 mg | INTRAMUSCULAR | Status: DC | PRN
  Administered 2022-10-03 (×2): 2 mg via INTRAVENOUS
  Administered 2022-10-04: 1 mg via INTRAVENOUS
  Filled 2022-10-03 (×2): qty 2

## 2022-10-03 MED ORDER — SODIUM CHLORIDE 0.9 % IV SOLN: 500.0000 mg | INTRAVENOUS | Status: DC

## 2022-10-03 MED ORDER — NOREPINEPHRINE 4 MG/250ML-% IV SOLN
2.0000 ug/min | INTRAVENOUS | Status: DC
  Administered 2022-10-03 (×2): 2 ug/min via INTRAVENOUS
  Filled 2022-10-03: qty 250

## 2022-10-03 MED ORDER — PROPOFOL 1000 MG/100ML IV EMUL
5.0000 ug/kg/min | INTRAVENOUS | Status: DC
  Filled 2022-10-03: qty 100

## 2022-10-03 MED ORDER — ORAL CARE MOUTH RINSE: 15.0000 mL | OROMUCOSAL | Status: DC | PRN

## 2022-10-03 MED ORDER — IPRATROPIUM-ALBUTEROL 0.5-2.5 (3) MG/3ML IN SOLN
3.0000 mL | Freq: Four times a day (QID) | RESPIRATORY_TRACT | Status: DC | PRN
  Filled 2022-10-03: qty 3

## 2022-10-03 MED FILL — Medication: Qty: 1 | Status: AC

## 2022-10-03 NOTE — ED Provider Notes (Signed)
  Physical Exam  BP (!) 90/50   Pulse (!) 101   Temp 99 F (37.2 C) (Axillary)   Resp 18   Ht 5\' 4"  (1.626 m)   Wt 64.9 kg   SpO2 90%   BMI 24.56 kg/m   Physical Exam  Procedures  Procedures  ED Course / MDM   Clinical Course as of 10/03/22 1210  Wed Oct 03, 2022  0421 Patient placed on 3L [place].  Still satting in the mid 80s.  Will start patient on BiPap. [RB]    Clinical Course User Index [RB] 02-01-1989, PA-C   Medical Decision Making Amount and/or Complexity of Data Reviewed Labs: ordered. Radiology: ordered. ECG/medicine tests: ordered.  Risk Prescription drug management. Decision regarding hospitalization.   ***

## 2022-10-03 NOTE — Progress Notes (Signed)
Chaplain engaged in an initial visit with Damon [name]'s family after he had coded.  Chaplain provided space for family to share about Damon [name]'s healthcare journey and their reactions and emotions around him coding.  They shared their shock and the fear of not knowing what was happening.  Chaplain affirmed and upheld their feelings. Family shared that [facility] had not been doing well and that they had tried to get him to the doctor before today because of him losing consciousness and struggling to breathe.  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  Damon [name] is close to his grandchildren and decided to finally go the hospital due to the prompting of his 60 year old granddaughter.   Chaplain was able to serve as a liaison between staff and family, getting family moved from the ED to the ICU waiting area.  Chaplain offered support through reflective listening, support, bringing them sustenance, and checking in.  Chaplain let nurse know of family being in the waiting area.   Chaplain will follow-up.   10/03/22 1300  Clinical Encounter Type  Visited With Family  Visit Type Initial;Spiritual support  Referral From Nurse  Consult/Referral To Chaplain  Spiritual Encounters  Spiritual Needs Emotional;Grief support

## 2022-10-03 NOTE — ED Triage Notes (Signed)
Pt BIB EMS from home for flu like symptoms. Pt presents with a cough and red sputum, n/v, and has had worsening SOB. Per EMS pt tested positive for the flu yesterday and is having chest pain.  146/70 85% RA 40-60 rr

## 2022-10-03 NOTE — Procedures (Signed)
Arterial Catheter Insertion Procedure Note  Damon [name]  [id number]  09-02-1961  Date:10/03/22  Time:3:36 PM    Provider Performing: Glenis Smoker Maryanna Stuber    Procedure: Insertion of Arterial Line (03212) without US guidance  Indication(s) Blood pressure monitoring and/or need for frequent ABGs  Consent Risks of the procedure as well as the alternatives and risks of each were explained to the patient and/or caregiver.  Consent for the procedure was obtained and is signed in the bedside chart  Anesthesia None   Time Out Verified patient identification, verified procedure, site/side was marked, verified correct patient position, special equipment/implants available, medications/allergies/relevant history reviewed, required imaging and test results available.   Sterile Technique Maximal sterile technique including full sterile barrier drape, hand hygiene, sterile gown, sterile gloves, mask, hair covering, sterile ultrasound probe cover (if used).   Procedure Description Area of catheter insertion was cleaned with chlorhexidine and draped in sterile fashion. Without real-time ultrasound guidance an arterial catheter was placed into the right radial artery.  Appropriate arterial tracings confirmed on monitor.     Complications/Tolerance None; patient tolerated the procedure well.   EBL Minimal   Specimen(s) None

## 2022-10-03 NOTE — Progress Notes (Signed)
eLink Physician-Brief Progress Note Patient Name: Damon [name] DOB: 1961/02/14 MRN: [id number]   Date of Service  10/03/2022  HPI/Events of Note  Post prone ABG has resulted  7.27/45/85 100%/ 10 PEEP/ 35 RR/ 470  eICU Interventions  Noted improvement Will maintain current therapies     Intervention Category Intermediate Interventions: Diagnostic test evaluation  Darl Pikes 10/03/2022, 10:48 PM

## 2022-10-03 NOTE — ED Notes (Signed)
Secretary noted the patient became bradycardic and notified staff. Patient was not responsive. Code timeline is as follows: 1139 BVM 1140 CPR began 1143 EPI given  1145 pulse check (pulse found) 1147 Intubation 7.5@26cm  by Dalene Seltzer MD 1149 CPR restarted (no longer had a pulse) 1149 Epi given 1150 Bicarb given 1151 Pulse check (pulse @ 81)

## 2022-10-03 NOTE — Procedures (Signed)
Central Venous Catheter Insertion Procedure Note  Damon [name]  [id number]  January 20, 1961  Date:10/03/22  Time:4:05 PM   Provider Performing:Elsy Chiang R Melissia Lahman   Procedure: Insertion of Non-tunneled Central Venous [phone]) with US guidance (11173)   Indication(s) Medication administration  Consent Risks of the procedure as well as the alternatives and risks of each were explained to the patient and/or caregiver.  Consent for the procedure was obtained and is signed in the bedside chart  Anesthesia Topical only with 1% lidocaine   Timeout Verified patient identification, verified procedure, site/side was marked, verified correct patient position, special equipment/implants available, medications/allergies/relevant history reviewed, required imaging and test results available.  Sterile Technique Maximal sterile technique including full sterile barrier drape, hand hygiene, sterile gown, sterile gloves, mask, hair covering, sterile ultrasound probe cover (if used).  Procedure Description Area of catheter insertion was cleaned with chlorhexidine and draped in sterile fashion.  With real-time ultrasound guidance a central venous catheter was placed into the left internal jugular vein. Nonpulsatile blood flow and easy flushing noted in all ports.  The catheter was sutured in place and sterile dressing applied.  Complications/Tolerance None; patient tolerated the procedure well. Chest X-ray is ordered to verify placement for internal jugular or subclavian cannulation.   Chest x-ray is not ordered for femoral cannulation.  EBL none  Specimen(s) None

## 2022-10-03 NOTE — ED Provider Notes (Incomplete)
  Physical Exam  BP (!) 90/50   Pulse (!) 101   Temp 99 F (37.2 C) (Axillary)   Resp 18   Ht 5\' 4"  (1.626 m)   Wt 64.9 kg   SpO2 90%   BMI 24.56 kg/m   Physical Exam  Procedures  Procedures  ED Course / MDM   Clinical Course as of 10/03/22 1210  Wed Oct 03, 2022  0421 Patient placed on 3L [place].  Still satting in the mid 80s.  Will start patient on BiPap. [RB]    Clinical Course User Index [RB] 02-01-1989, PA-C   Patient awaiting admission in the ED for acute hypoxic respiratory failure secondary to influenza A and CAP on BiPAP.  Was in room next door seeing patient and called into his room as he was found to be in PEA arrest with bradycardia.  Concern for possible aspiration while on BiPAP.  Found to be in PEA arrest, given 1mg  epinephrine, BVM initiated, with ROSC after 5 minutes of CPR.  Intubated 7.5ETT and with loss of pulses again with PEA and ROSC after Roxy Horseman of CPR.  Glucose WNL. Pupils minimally reactive, no response to pain.   CXR evalauted by me with ETT in place, retracted 1cm, severe bilateral airspace disease.   Dr. discussed event with wife who was just arriving to room at time of cardiac arrest.  Unfortunately due to patient care at that moment I was not able to discuss with her immediately other than confirming C

## 2022-10-03 NOTE — Progress Notes (Signed)
RT NOTE:  ABG obtained and sent to lab. Lab notified. 

## 2022-10-03 NOTE — Consult Note (Signed)
NAME:  Damon [name], MRN:  [id number], DOB:  04/15/1961, LOS: 0 ADMISSION DATE:  10/03/2022, CONSULTATION DATE:  10/03/22 REFERRING MD:  Ronaldo Miyamoto CHIEF COMPLAINT:  Resp Failure   History of Present Illness:  Damon [name] is a 61 y.o. male who has a PMH as below. He presented to [facility] urgent care 12/18 with confusion, worsening dyspnea after "recent respiratory infection", cough, congestion, fever, chills. On day of presentation, he began to stumble and almost fall and also had an episode of incontinence which is apparently not normal for him. Hee was hypoxic to 88% on RA and was placed on 5L O2 via [place] before being sent to [facility] ED for further evaluation and management.  He was seen by EDP early AM 12/19 and his flu swab was noted to be positive for flu A. He was discharged home from ED.  He then returned early AM 12/20 with persistent cough, N/V, worsening dyspnea. He was found to have O2 sat of 85% and was placed on BiPAP for increased WOB. CXR showed bilateral infiltrates R > L.  He was admitted by [facility] and was continued on BiPAP with CAP coverage initiation. A few hours after admission, he became agitated, pulling at lines and his BiPAP mask to the point that he continually ripped it off. He was then noted to have bradycardia and became unresponsive. A code Damon was called. He was found in PEA and had roughly 5 minutes of ACLS before ROSC. During intubation, he had a 2nd brief arrest, presumed respiratory after an aspiration event.  Post code, he was minimally responsive (no sedation for intubation). Repeat labs were sent.   PCCM ultimately called for admission.    Pertinent  Medical History:  has Acute respiratory failure with hypoxia (HCC); CAP (community acquired pneumonia); Influenza and pneumonia; Elevated LFTs; HTN (hypertension); HLD (hyperlipidemia); Tobacco abuse; and SIRS (systemic inflammatory response syndrome) (HCC) on their problem list.  Significant Hospital Events: Including  procedures, antibiotic start and stop dates in addition to other pertinent events   12/18 flu A positive 12/20 admitted, 5 minute code after non-compliance with BiPAP  Interim History / Subjective:  Minimally responsive after intubation. Did not receive any sedation.  Objective:  Blood pressure (!) 90/50, pulse (!) 128, temperature 98.4 F (36.9 C), resp. rate 17, height 5\' 4"  (1.626 m), weight 64.9 kg, SpO2 98 %.    Vent Mode: PRVC FiO2 (%):  [35 %-100 %] 100 % Set Rate:  [12 bmp-20 bmp] 20 bmp Vt Set:  [470 mL] 470 mL PEEP:  [10 cmH20] 10 cmH20  No intake or output data in the 24 hours ending 10/03/22 1322 Filed Weights   10/03/22 0359  Weight: 64.9 kg    Examination: General: Adult male, resting in bed, in NAD. Neuro: Minimally responsive, does not follow any commands. HEENT: [place]/AT. Sclerae anicteric. ETT in place. Cardiovascular: RRR, no M/R/G.  Lungs: Respirations even and unlabored.  Coarse bilaterally R > L. Abdomen: BS x 4, soft, NT/ND.  Musculoskeletal: No gross deformities, no edema.  Skin: Intact, warm, no rashes.  Labs/imaging personally reviewed:  CXR 12/20 > bilateral opacities R > L.  Assessment & Plan:   Acute hypoxic respiratory failure - multifactorial in setting flu A and probable PNA. Unfortunately exacerbated by non-compliance with BiPAP leading to brief respiratory arrest and complicated by aspiration event peri intubation. Hx tobacco dependence. - Full vent support. - Change abx to Zosyn. - Follow cultures, add trach aspirate. - SBT when mental status allows. -  BD's. - CXR intermittently. - Tobacco cessation counseling.  Sepsis - 2/2 above. - Fluids. - Abx as above. - Follow PCT.  Brief arrest - likely primary respiratory 2/2 above. - Supportive care as above. - Repeat labs now. - Consider CT head and echo based on his course and recovery over next 12 - 24 hours.  Hx HTN, HLD. - Supportive care.   Best practice (evaluated daily):   Diet/type: NPO DVT prophylaxis: LMWH GI prophylaxis: H2B Lines: N/A Foley:  N/A Code Status:  full code Last date of multidisciplinary goals of care discussion: None yet.  Labs   CBC: Recent Labs  Lab 10/02/22 0323 10/03/22 0427  WBC 9.5 2.6*  NEUTROABS 7.5 2.2  HGB 11.6* 12.1*  HCT 35.3* 36.2*  MCV 83.3 82.1  PLT 181 154    Basic Metabolic Panel: Recent Labs  Lab 10/02/22 0323 10/03/22 0427  NA 129* 134*  K 4.0 3.8  CL 98 105  CO2 22 19*  GLUCOSE 101* 93  BUN 25* 19  CREATININE 1.36* 0.98  CALCIUM 8.2* 7.6*   GFR: Estimated Creatinine Clearance: 67.1 mL/min (by C-G formula based on SCr of 0.98 mg/dL). Recent Labs  Lab 10/02/22 0323 10/03/22 0427  PROCALCITON  --  7.05  WBC 9.5 2.6*  LATICACIDVEN  --  1.4    Liver Function Tests: Recent Labs  Lab 10/03/22 0427  AST 82*  ALT 35  ALKPHOS 48  BILITOT 1.0  PROT 5.7*  ALBUMIN 2.4*   No results for input(s): "LIPASE", "AMYLASE" in the last 168 hours. No results for input(s): "AMMONIA" in the last 168 hours.  ABG    Component Value Date/Time   HCO3 20.4 10/03/2022 0506   ACIDBASEDEF 3.7 (H) 10/03/2022 0506   O2SAT 66.4 10/03/2022 0506     Coagulation Profile: Recent Labs  Lab 10/03/22 0427  INR 1.2    Cardiac Enzymes: No results for input(s): "CKTOTAL", "CKMB", "CKMBINDEX", "TROPONINI" in the last 168 hours.  HbA1C: No results found for: "HGBA1C"  CBG: Recent Labs  Lab 10/03/22 1146  GLUCAP 70    Review of Systems:   Unable to obtain as pt is encephalopathic.  Past Medical History:  He,  has a past medical history of Elevated lipids and Hypertension.   Surgical History:   Past Surgical History:  Procedure Laterality Date   INCISION AND DRAINAGE Left 10/06/2013   Procedure: INCISION AND DRAINAGE LEFT LONG FINGER;  Surgeon: Tami Ribas, MD;  Location: [facility];  Service: Orthopedics;  Laterality: Left;   STERIOD INJECTION  2005back     Social History:    reports that he has been smoking cigarettes. He has a 20.00 pack-year smoking history. He has never used smokeless tobacco. He reports that he does not drink alcohol and does not use drugs.   Family History:  His family history includes Cancer in his brother; Colon cancer (age of onset: 75) in his mother; Diabetes in his mother and sister; Heart attack in his brother; Lung cancer in his father.   Allergies No Known Allergies   Home Medications  Prior to Admission medications   Medication Sig Start Date End Date Taking? Authorizing Provider  acetaminophen (TYLENOL) 500 MG tablet Take 1 tablet (500 mg total) by mouth every 6 (six) hours as needed. 10/02/22   Redwine, Madison A, PA-C  amLODipine (NORVASC) 5 MG tablet Take 1 tablet by mouth daily. 10/21/17   [provider]  atorvastatin (LIPITOR) 10 MG tablet Take 1  tablet by mouth daily. 11/23/17   [provider]  benzonatate (TESSALON) 100 MG capsule Take 1 capsule (100 mg total) by mouth every 8 (eight) hours. 10/02/22   Redwine, Madison A, PA-C  ibuprofen (ADVIL) 600 MG tablet Take 1 tablet (600 mg total) by mouth every 6 (six) hours as needed. 10/02/22   Redwine, Madison A, PA-C  ondansetron (ZOFRAN) 4 MG tablet Take 1 tablet (4 mg total) by mouth every 6 (six) hours. 10/02/22   Redwine, Madison A, PA-C  pantoprazole (PROTONIX) 40 MG tablet Take 1 tablet by mouth daily. 10/21/17   [provider]     Critical care time: 35 min.    Rutherford Guys, PA - C [facility] For pager details, please see AMION or use Epic chat  After 1900, please call [facility] for cross coverage needs 10/03/2022, 1:22 PM

## 2022-10-03 NOTE — Progress Notes (Signed)
  Transition of Care [facility]) Screening Note   Patient Details  Name: Damon [name] Date of Birth: 1961-09-18   Transition of Care [facility]) CM/SW Contact:    Lavenia Atlas, RN Phone Number: 10/03/2022, 4:52 PM  Central line placement today. TOC will continue to follow for needs.  Transition of Care Department [facility]) has reviewed patient and no TOC needs have been identified at this time. We will continue to monitor patient advancement through interdisciplinary progression rounds. If new patient transition needs arise, please place a TOC consult.

## 2022-10-03 NOTE — Progress Notes (Signed)
PCCM Interval Progress Note  Contacted by pharmacy as urine strep antigen returned positive. Given unknown whether aspirated gastric contents vs sputum alone, will change abx from Zosyn to Ceftriaxone + Flagyl.   Rutherford Guys, PA - C  Pulmonary & Critical Care Medicine For pager details, please see AMION or use Epic chat  After 1900, please call [facility] for cross coverage needs 10/03/2022, 3:58 PM

## 2022-10-03 NOTE — ED Provider Notes (Signed)
[facility]-EMERGENCY DEPT [facility] Emergency Department Provider Note MRN:  [id number]  Arrival date & time: 10/03/22     Chief Complaint   Influenza   History of Present Illness   Damon [name] is a 61 y.o. year-old male presents to the ED with chief complaint of SOB.  Was diagnosed with influenza A yesterday.  States that he woke up with significant SOB tonight.  BIB EMS.  Reports cough and red sputum.  Was 85% on RA with significant tachypnea and increased WOB.  History provided by patient.   Review of Systems  Pertinent positive and negative review of systems noted in HPI.    Physical Exam   Vitals:   10/03/22 0458 10/03/22 0458  BP:    Pulse:    Resp: (!) 29   Temp:  98.4 F (36.9 C)  SpO2:      CONSTITUTIONAL:  short of breath-appearing, NAD NEURO:  Alert and oriented x 3, CN 3-12 grossly intact EYES:  eyes equal and reactive ENT/NECK:  Supple, no stridor  CARDIO:  tachycardic, regular rhythm, appears well-perfused  PULM:  Moderate respiratory distress, increased WOB, abdominal retractions, diffuse crackles, speaks in 3-5 word phrases GI/GU:  non-distended, non tender MSK/SPINE:  No gross deformities, no edema, moves all extremities  SKIN:  no rash, atraumatic   *Additional and/or pertinent findings included in MDM below  Diagnostic and Interventional Summary    EKG Interpretation  Date/Time:    Ventricular Rate:    PR Interval:    QRS Duration:   QT Interval:    QTC Calculation:   R Axis:     Text Interpretation:         Labs Reviewed  COMPREHENSIVE METABOLIC PANEL - Abnormal; Notable for the following components:      Result Value   Sodium 134 (*)    CO2 19 (*)    Calcium 7.6 (*)    Total Protein 5.7 (*)    Albumin 2.4 (*)    AST 82 (*)    All other components within normal limits  CBC WITH DIFFERENTIAL/PLATELET - Abnormal; Notable for the following components:   WBC 2.6 (*)    Hemoglobin 12.1 (*)    HCT 36.2 (*)    All other  components within normal limits  CULTURE, BLOOD (ROUTINE X 2)  CULTURE, BLOOD (ROUTINE X 2)  URINE CULTURE  LACTIC ACID, PLASMA  PROTIME-INR  APTT  LACTIC ACID, PLASMA  URINALYSIS, ROUTINE W REFLEX MICROSCOPIC  BLOOD GAS, VENOUS    DG Chest Port 1 View  Final Result      Medications  cefTRIAXone (ROCEPHIN) 1 g in sodium chloride 0.9 % 100 mL IVPB (has no administration in time range)  azithromycin (ZITHROMAX) 500 mg in sodium chloride 0.9 % 250 mL IVPB (has no administration in time range)  ipratropium-albuterol (DUONEB) 0.5-2.5 (3) MG/3ML nebulizer solution 3 mL (3 mLs Nebulization Given 10/03/22 0436)     Procedures  /  Critical Care .Critical Care  Performed by: Roxy Horseman, PA-C Authorized by: Roxy Horseman, PA-C   Critical care provider statement:    Critical care time (minutes):  50   Critical care was necessary to treat or prevent imminent or life-threatening deterioration of the following conditions:  Respiratory failure   Critical care was time spent personally by me on the following activities:  Development of treatment plan with patient or surrogate, discussions with consultants, evaluation of patient's response to treatment, examination of patient, ordering and review of laboratory studies, ordering  and review of radiographic studies, ordering and performing treatments and interventions, pulse oximetry, re-evaluation of patient's condition and review of old charts   ED Course and Medical Decision Making  I have reviewed the triage vital signs, the nursing notes, and pertinent available records from the EMR.  Social Determinants Affecting Complexity of Care: Patient has no clinically significant social determinants affecting this chief complaint..   ED Course: Clinical Course as of 10/03/22 0515  Wed Oct 03, 2022  0421 Patient placed on 3L [place].  Still satting in the mid 80s.  Will start patient on BiPap. [RB]    Clinical Course User Index [RB] Montine Circle, PA-C    Medical Decision Making Patient here with SOB.  Was diagnosed yesterday with influenza A.  States that he began having worsening SOB tonight and called EMS.    Patient was hypoxic to the low 80s on RA.  Was put on 3L [place] and increased to 87%.  Still was having significant SOB.  Patient put on Bipap.    5:01 AM Patient reassessed.  He is improving on Bipap.  Still has some accessory muscle use, but states that he is feeling better.   Will need admission respiratory failure.  Amount and/or Complexity of Data Reviewed Labs: ordered.    Details: Leukopenia of 2.6 Normal lactic Improved Cr compared to yesterday.  Now 0.98, down from 1.36 yesterday. VBG pending Radiology: ordered and independent interpretation performed.    Details: Bilateral hazy infiltrates.  Will start ABX to cover for CAP.  Discussed with Dr. Roxanne Mins, who agrees. ECG/medicine tests: ordered.  Risk Prescription drug management. Decision regarding hospitalization.     Consultants: I discussed the case with Hospitalist, Dr. Nevada Crane, who is appreciated for admitting.   Treatment and Plan: Patient's exam and diagnostic results are concerning for acute hypoxic respiratory failure.  Feel that patient will need admission to the hospital for further treatment and evaluation.  Patient seen by and discussed with attending physician, Dr. Roxanne Mins, who agrees with plan.  Final Clinical Impressions(s) / ED Diagnoses     ICD-10-CM   1. Acute hypoxic respiratory failure (HCC)  J96.01     2. Influenza A  J10.1       ED Discharge Orders     None         Discharge Instructions Discussed with and Provided to Patient:   Discharge Instructions   None      Montine Circle, PA-C [id number]    Delora Fuel, MD 0000000 [phone]

## 2022-10-03 NOTE — H&P (Signed)
History and Physical    Patient: Damon [name] LFY:[id number] DOB: 12/26/1960 DOA: 10/03/2022 DOS: the patient was seen and examined on 10/03/2022 [facility]CP: [facility]  Patient coming from: Home  Chief Complaint:  Chief Complaint  Patient presents with   Influenza   HPI: Damon [name] is a 61 y.o. male with medical history significant of HTN, HLD, chronic back pain, tobacco abuse. Presenting with shortness of breath. He reports that he was diagnosed with flu several days ago after his daughter was diagnosed with the same thing. He says he's been general ok; however, he has an ED visit in the time suggesting that he's been symptomatic for 6 days down. He reports that his shortness of breath and cough worsened this morning around 0300hrs. He had productive cough. He had chills. He couldn't catch his breath. He became concerned and called for EMS. He denies any other aggravating or alleviating factors.    Review of Systems: As mentioned in the history of present illness. All other systems reviewed and are negative. Past Medical History:  Diagnosis Date   Elevated lipids    Hypertension    Past Surgical History:  Procedure Laterality Date   INCISION AND DRAINAGE Left 10/06/2013   Procedure: INCISION AND DRAINAGE LEFT LONG FINGER;  Surgeon: Tami Ribas, MD;  Location: [facility];  Service: Orthopedics;  Laterality: Left;   STERIOD INJECTION  2005back   Social History:  reports that he has been smoking cigarettes. He has a 20.00 pack-year smoking history. He has never used smokeless tobacco. He reports that he does not drink alcohol and does not use drugs.  No Known Allergies  Family History  Problem Relation Age of Onset   Colon cancer Mother 74   Diabetes Mother    Lung cancer Father    Diabetes Sister    Heart attack Brother    Cancer Brother        type unknown    Prior to Admission medications   Medication Sig Start Date End Date Taking? Authorizing  Provider  acetaminophen (TYLENOL) 500 MG tablet Take 1 tablet (500 mg total) by mouth every 6 (six) hours as needed. 10/02/22   Redwine, Madison A, PA-C  amLODipine (NORVASC) 5 MG tablet Take 1 tablet by mouth daily. 10/21/17   [provider]  atorvastatin (LIPITOR) 10 MG tablet Take 1 tablet by mouth daily. 11/23/17   [provider]  benzonatate (TESSALON) 100 MG capsule Take 1 capsule (100 mg total) by mouth every 8 (eight) hours. 10/02/22   Redwine, Madison A, PA-C  ibuprofen (ADVIL) 600 MG tablet Take 1 tablet (600 mg total) by mouth every 6 (six) hours as needed. 10/02/22   Redwine, Madison A, PA-C  ondansetron (ZOFRAN) 4 MG tablet Take 1 tablet (4 mg total) by mouth every 6 (six) hours. 10/02/22   Redwine, Madison A, PA-C  pantoprazole (PROTONIX) 40 MG tablet Take 1 tablet by mouth daily. 10/21/17   [provider]    Physical Exam: Vitals:   10/03/22 0453 10/03/22 0457 10/03/22 0458 10/03/22 0458  BP:  126/76    Pulse: (!) 108     Resp: (!) 28 16 (!) 29   Temp:    98.4 F (36.9 C)  TempSrc:    Axillary  SpO2: 93%     Weight:      Height:       General: 61 y.o. male resting in bed in NAD Eyes: PERRL, normal sclera ENMT: Nares patent w/o  discharge, orophaynx clear, dentition normal, ears w/o discharge/lesions/ulcers Neck: Supple, trachea midline Cardiovascular: tachy, +S1, S2, no m/g/r, equal pulses throughout Respiratory: scattered rhonchi, no w/r, increased WOB on BiPap GI: BS+, NDNT, no masses noted, no organomegaly noted MSK: No e/c/c Skin: No rashes, bruises, ulcerations noted Neuro: A&O x 3, no focal deficits Psyc: Appropriate interaction and affect, calm/cooperative  Data Reviewed:  Results for orders placed or performed during the hospital encounter of 10/03/22 (from the past 24 hour(s))  Lactic acid, plasma     Status: None   Collection Time: 10/03/22  4:27 AM  Result Value Ref Range   Lactic Acid, Venous 1.4 0.5 - 1.9 mmol/L   Comprehensive metabolic panel     Status: Abnormal   Collection Time: 10/03/22  4:27 AM  Result Value Ref Range   Sodium 134 (L) 135 - 145 mmol/L   Potassium 3.8 3.5 - 5.1 mmol/L   Chloride 105 98 - 111 mmol/L   CO2 19 (L) 22 - 32 mmol/L   Glucose, Bld 93 70 - 99 mg/dL   BUN 19 6 - 20 mg/dL   Creatinine, Ser 3.50 0.61 - 1.24 mg/dL   Calcium 7.6 (L) 8.9 - 10.3 mg/dL   Total Protein 5.7 (L) 6.5 - 8.1 g/dL   Albumin 2.4 (L) 3.5 - 5.0 g/dL   AST 82 (H) 15 - 41 U/L   ALT 35 0 - 44 U/L   Alkaline Phosphatase 48 38 - 126 U/L   Total Bilirubin 1.0 0.3 - 1.2 mg/dL   GFR, Estimated >09 >38 mL/min   Anion gap 10 5 - 15  CBC with Differential     Status: Abnormal   Collection Time: 10/03/22  4:27 AM  Result Value Ref Range   WBC 2.6 (L) 4.0 - 10.5 K/uL   RBC 4.41 4.22 - 5.81 MIL/uL   Hemoglobin 12.1 (L) 13.0 - 17.0 g/dL   HCT 18.2 (L) 99.3 - 71.6 %   MCV 82.1 80.0 - 100.0 fL   MCH 27.4 26.0 - 34.0 pg   MCHC 33.4 30.0 - 36.0 g/dL   RDW 96.7 89.3 - 81.0 %   Platelets 154 150 - 400 K/uL   nRBC 0.0 0.0 - 0.2 %   Neutrophils Relative % 84 %   Neutro Abs 2.2 1.7 - 7.7 K/uL   Lymphocytes Relative 10 %   Lymphs Abs 0.3 (L) 0.7 - 4.0 K/uL   Monocytes Relative 3 %   Monocytes Absolute 0.1 0.1 - 1.0 K/uL   Eosinophils Relative 0 %   Eosinophils Absolute 0.0 0.0 - 0.5 K/uL   Basophils Relative 1 %   Basophils Absolute 0.0 0.0 - 0.1 K/uL   Immature Granulocytes 2 %   Abs Immature Granulocytes 0.04 0.00 - 0.07 K/uL  Protime-INR     Status: None   Collection Time: 10/03/22  4:27 AM  Result Value Ref Range   Prothrombin Time 14.7 11.4 - 15.2 seconds   INR 1.2 0.8 - 1.2  APTT     Status: None   Collection Time: 10/03/22  4:27 AM  Result Value Ref Range   aPTT 33 24 - 36 seconds  Blood gas, venous (at [facility] and [facility])     Status: Abnormal   Collection Time: 10/03/22  5:06 AM  Result Value Ref Range   pH, Ven 7.4 7.25 - 7.43   pCO2, Ven 33 (L) 44 - 60 mmHg   pO2, Ven 39 32 - 45 mmHg    Bicarbonate  20.4 20.0 - 28.0 mmol/L   Acid-base deficit 3.7 (H) 0.0 - 2.0 mmol/L   O2 Saturation 66.4 %   Patient temperature 37.0    CXR: Moderate to marked severity bilateral infiltrates.   Assessment and Plan: Acute respiratory failure w/ hypoxia CAP Influenza A SIRS     - admit to inpt, SDU     - wean BiPap as able     - continue CAP coverage     - outside window for tamiflu     - add nebs, ibuprofen, guaifenesin     - check procal, urine legionella/strep     - fluids  Tobacco abuse     - counsel against further use  Elevated LFTs     - check hepatitis panel  HTN     - continue home regimen when confirmed  HLD     - hold home regimen for now d/t elevated LFTs  Advance Care Planning:   Code Status: FULL  Consults: None  Family Communication: None at bedside  Severity of Illness: The appropriate patient status for this patient is INPATIENT. Inpatient status is judged to be reasonable and necessary in order to provide the required intensity of service to ensure the patient's safety. The patient's presenting symptoms, physical exam findings, and initial radiographic and laboratory data in the context of their chronic comorbidities is felt to place them at high risk for further clinical deterioration. Furthermore, it is not anticipated that the patient will be medically stable for discharge from the hospital within 2 midnights of admission.   * I certify that at the point of admission it is my clinical judgment that the patient will require inpatient hospital care spanning beyond 2 midnights from the point of admission due to high intensity of service, high risk for further deterioration and high frequency of surveillance required.*  Author: Teddy Spike, DO 10/03/2022 7:30 AM  For on call review [email or web address].

## 2022-10-03 NOTE — Progress Notes (Signed)
Pharmacy Antibiotic Note  Damon [name] is a 61 y.o. male admitted on 10/03/2022 with cough, SOB, N/V, + Flu A.  Pharmacy has been consulted for zosyn dosing for aspiration PNA.   Plan: Zosyn 3.375g IV q8h (4 hour infusion).  Height: 5\' 4"  (162.6 cm) Weight: 64.9 kg (143 lb 1.3 oz) IBW/kg (Calculated) : 59.2  Temp (24hrs), Avg:98.6 F (37 C), Min:98.4 F (36.9 C), Max:99 F (37.2 C)  Recent Labs  Lab 10/02/22 0323 10/03/22 0427  WBC 9.5 2.6*  CREATININE 1.36* 0.98  LATICACIDVEN  --  1.4    Estimated Creatinine Clearance: 67.1 mL/min (by C-G formula based on SCr of 0.98 mg/dL).    No Known Allergies Antimicrobials this admission:  12/20 Azith x 1 12/20 CTX x 1 12/20 zosyn>> Dose adjustments this admission:   Microbiology results:  12/18 Flu A + 20/20 UCx:  12/20 BCx2:   Thank you for allowing pharmacy to be a part of this patient's care.  1/21, Pharm.D Use secure chat for questions 10/03/2022 1:00 PM

## 2022-10-03 NOTE — Progress Notes (Signed)
RT NOTE:  Pt proned @1814  with no complications noted.

## 2022-10-03 NOTE — Progress Notes (Signed)
Notified by nursing @ 1145hrs that patient was coding and that he was intubated. Arrived at bedside and EDP was running the code. He had several rounds of CPR and ROSC was achieved. Apparently it was noted on telemetry that he had bradied down; and when he was checked on, he was unresponsive. This is when the code was initiated.   Repeat CXR was ordered and appears to be a little more dense on RLL compared to earlier imaging; but awaiting final read. Question if he has had an aspiration event.  Spoke with family and updated them on the situation. Spoke with CCM. They will assume care. Appreciate their assistance.   Teddy Spike, DO

## 2022-10-04 DIAGNOSIS — J9601 Acute respiratory failure with hypoxia: Secondary | ICD-10-CM | POA: Diagnosis not present

## 2022-10-04 LAB — GLUCOSE, CAPILLARY
Glucose-Capillary: 116 mg/dL — ABNORMAL HIGH (ref 70–99)
Glucose-Capillary: 117 mg/dL — ABNORMAL HIGH (ref 70–99)
Glucose-Capillary: 117 mg/dL — ABNORMAL HIGH (ref 70–99)
Glucose-Capillary: 134 mg/dL — ABNORMAL HIGH (ref 70–99)
Glucose-Capillary: 144 mg/dL — ABNORMAL HIGH (ref 70–99)
Glucose-Capillary: 157 mg/dL — ABNORMAL HIGH (ref 70–99)

## 2022-10-04 LAB — BLOOD CULTURE ID PANEL (REFLEXED) - BCID2
A.calcoaceticus-baumannii: NOT DETECTED
Bacteroides fragilis: NOT DETECTED
Candida albicans: NOT DETECTED
Candida auris: NOT DETECTED
Candida glabrata: NOT DETECTED
Candida krusei: NOT DETECTED
Candida parapsilosis: NOT DETECTED
Candida tropicalis: NOT DETECTED
Cryptococcus neoformans/gattii: NOT DETECTED
Enterobacter cloacae complex: NOT DETECTED
Enterobacterales: NOT DETECTED
Enterococcus Faecium: NOT DETECTED
Enterococcus faecalis: NOT DETECTED
Escherichia coli: NOT DETECTED
Haemophilus influenzae: NOT DETECTED
Klebsiella aerogenes: NOT DETECTED
Klebsiella oxytoca: NOT DETECTED
Klebsiella pneumoniae: NOT DETECTED
Listeria monocytogenes: NOT DETECTED
Methicillin resistance mecA/C: NOT DETECTED
Neisseria meningitidis: NOT DETECTED
Proteus species: NOT DETECTED
Pseudomonas aeruginosa: NOT DETECTED
Salmonella species: NOT DETECTED
Serratia marcescens: NOT DETECTED
Staphylococcus aureus (BCID): NOT DETECTED
Staphylococcus epidermidis: DETECTED — AB
Staphylococcus lugdunensis: DETECTED — AB
Staphylococcus species: DETECTED — AB
Stenotrophomonas maltophilia: NOT DETECTED
Streptococcus agalactiae: NOT DETECTED
Streptococcus pneumoniae: NOT DETECTED
Streptococcus pyogenes: NOT DETECTED
Streptococcus species: NOT DETECTED

## 2022-10-04 LAB — COMPREHENSIVE METABOLIC PANEL
ALT: 36 U/L (ref 0–44)
AST: 71 U/L — ABNORMAL HIGH (ref 15–41)
Albumin: 2.1 g/dL — ABNORMAL LOW (ref 3.5–5.0)
Alkaline Phosphatase: 47 U/L (ref 38–126)
Anion gap: 8 (ref 5–15)
BUN: 19 mg/dL (ref 6–20)
CO2: 21 mmol/L — ABNORMAL LOW (ref 22–32)
Calcium: 7.3 mg/dL — ABNORMAL LOW (ref 8.9–10.3)
Chloride: 108 mmol/L (ref 98–111)
Creatinine, Ser: 1.09 mg/dL (ref 0.61–1.24)
GFR, Estimated: >60 mL/min (ref >60)
Glucose, Bld: 118 mg/dL — ABNORMAL HIGH (ref 70–99)
Potassium: 4.5 mmol/L (ref 3.5–5.1)
Sodium: 137 mmol/L (ref 135–145)
Total Bilirubin: 0.6 mg/dL (ref 0.3–1.2)
Total Protein: 5.3 g/dL — ABNORMAL LOW (ref 6.5–8.1)

## 2022-10-04 LAB — CORTISOL-AM, BLOOD: Cortisol - AM: >100 ug/dL — ABNORMAL HIGH (ref 6.7–22.6)

## 2022-10-04 LAB — BLOOD GAS, ARTERIAL
Acid-base deficit: 5.2 mmol/L — ABNORMAL HIGH (ref 0.0–2.0)
Acid-base deficit: 6.5 mmol/L — ABNORMAL HIGH (ref 0.0–2.0)
Acid-base deficit: 3 mmol/L — ABNORMAL HIGH (ref 0.0–2.0)
Bicarbonate: 24.3 mmol/L (ref 20.0–28.0)
Bicarbonate: 21.7 mmol/L (ref 20.0–28.0)
Bicarbonate: 24.4 mmol/L (ref 20.0–28.0)
FIO2: 80 %
FIO2: 80 %
FIO2: 80 %
MECHVT: 410 mL
MECHVT: 470 mL
O2 Saturation: 99.1 %
O2 Saturation: 100 %
O2 Saturation: 100 %
PEEP: 10 cmH2O
PEEP: 10 cmH2O
PEEP: 10 cmH2O
Patient temperature: 37.9
Patient temperature: 37.8
Patient temperature: 37.4
RATE: 35 resp/min
RATE: 35 resp/min
pCO2 arterial: 68 mmHg (ref 32–48)
pCO2 arterial: 55 mmHg — ABNORMAL HIGH (ref 32–48)
pCO2 arterial: 53 mmHg — ABNORMAL HIGH (ref 32–48)
pH, Arterial: 7.17 — CL (ref 7.35–7.45)
pH, Arterial: 7.21 — ABNORMAL LOW (ref 7.35–7.45)
pH, Arterial: 7.27 — ABNORMAL LOW (ref 7.35–7.45)
pO2, Arterial: 131 mmHg — ABNORMAL HIGH (ref 83–108)
pO2, Arterial: 115 mmHg — ABNORMAL HIGH (ref 83–108)
pO2, Arterial: 130 mmHg — ABNORMAL HIGH (ref 83–108)

## 2022-10-04 LAB — TRIGLYCERIDES: Triglycerides: 120 mg/dL (ref <150)

## 2022-10-04 LAB — CBC
HCT: 37 % — ABNORMAL LOW (ref 39.0–52.0)
Hemoglobin: 12 g/dL — ABNORMAL LOW (ref 13.0–17.0)
MCH: 27.6 pg (ref 26.0–34.0)
MCHC: 32.4 g/dL (ref 30.0–36.0)
MCV: 85.1 fL (ref 80.0–100.0)
Platelets: 158 10*3/uL (ref 150–400)
RBC: 4.35 MIL/uL (ref 4.22–5.81)
RDW: 15.8 % — ABNORMAL HIGH (ref 11.5–15.5)
WBC: 9.1 10*3/uL (ref 4.0–10.5)
nRBC: 0 % (ref 0.0–0.2)

## 2022-10-04 LAB — PROTIME-INR
INR: 1.4 — ABNORMAL HIGH (ref 0.8–1.2)
Prothrombin Time: 17.3 seconds — ABNORMAL HIGH (ref 11.4–15.2)

## 2022-10-04 LAB — URINE CULTURE: Culture: NO GROWTH

## 2022-10-04 LAB — POCT I-STAT 7, (LYTES, BLD GAS, ICA,H+H)
Acid-base deficit: 6 mmol/L — ABNORMAL HIGH (ref 0.0–2.0)
Bicarbonate: 22.1 mmol/L (ref 20.0–28.0)
Calcium, Ion: 1.21 mmol/L (ref 1.15–1.40)
HCT: 33 % — ABNORMAL LOW (ref 39.0–52.0)
Hemoglobin: 11.2 g/dL — ABNORMAL LOW (ref 13.0–17.0)
O2 Saturation: 92 %
Patient temperature: 99.3
Potassium: 4.8 mmol/L (ref 3.5–5.1)
Sodium: 139 mmol/L (ref 135–145)
TCO2: 24 mmol/L (ref 22–32)
pCO2 arterial: 54.3 mmHg — ABNORMAL HIGH (ref 32–48)
pH, Arterial: 7.22 — ABNORMAL LOW (ref 7.35–7.45)
pO2, Arterial: 78 mmHg — ABNORMAL LOW (ref 83–108)

## 2022-10-04 LAB — LEGIONELLA PNEUMOPHILA SEROGP 1 UR AG: L. pneumophila Serogp 1 Ur Ag: NEGATIVE

## 2022-10-04 LAB — HEMOGLOBIN A1C
Hgb A1c MFr Bld: 5.9 % — ABNORMAL HIGH (ref 4.8–5.6)
Mean Plasma Glucose: 123 mg/dL

## 2022-10-04 LAB — PROCALCITONIN: Procalcitonin: 139.1 ng/mL

## 2022-10-04 MED ORDER — LACTATED RINGERS IV BOLUS
500.0000 mL | Freq: Once | INTRAVENOUS | Status: AC
  Administered 2022-10-04: 500 mL via INTRAVENOUS

## 2022-10-04 MED ORDER — SODIUM CHLORIDE 0.9 % IV SOLN
2.0000 g | Freq: Every day | INTRAVENOUS | Status: DC
  Administered 2022-10-04 – 2022-10-09 (×6): 2 g via INTRAVENOUS
  Filled 2022-10-04 (×6): qty 20

## 2022-10-04 MED ORDER — OSELTAMIVIR PHOSPHATE 6 MG/ML PO SUSR
75.0000 mg | Freq: Two times a day (BID) | ORAL | Status: AC
  Administered 2022-10-04 – 2022-10-08 (×10): 75 mg
  Filled 2022-10-04 (×10): qty 12.5

## 2022-10-04 MED ORDER — PANTOPRAZOLE SODIUM 40 MG IV SOLR
40.0000 mg | INTRAVENOUS | Status: DC
  Administered 2022-10-04 – 2022-10-13 (×10): 40 mg via INTRAVENOUS
  Filled 2022-10-04 (×10): qty 10

## 2022-10-04 MED ORDER — ALBUMIN HUMAN 25 % IV SOLN
25.0000 g | Freq: Once | INTRAVENOUS | Status: AC
  Administered 2022-10-04: 25 g via INTRAVENOUS
  Filled 2022-10-04: qty 100

## 2022-10-04 MED ORDER — ACETAMINOPHEN 160 MG/5ML PO SOLN
650.0000 mg | Freq: Four times a day (QID) | ORAL | Status: DC | PRN
  Administered 2022-10-05 – 2023-01-22 (×71): 650 mg
  Filled 2022-10-04 (×74): qty 20.3

## 2022-10-04 MED ORDER — NOREPINEPHRINE 4 MG/250ML-% IV SOLN
0.0000 ug/min | INTRAVENOUS | Status: DC
  Administered 2022-10-04: 6 ug/min via INTRAVENOUS
  Administered 2022-10-04: 12 ug/min via INTRAVENOUS
  Administered 2022-10-04: 13 ug/min via INTRAVENOUS
  Filled 2022-10-04 (×2): qty 250

## 2022-10-04 MED ORDER — IBUPROFEN 100 MG/5ML PO SUSP
600.0000 mg | Freq: Four times a day (QID) | ORAL | Status: DC | PRN
  Administered 2022-10-05: 600 mg
  Filled 2022-10-04 (×2): qty 30

## 2022-10-04 MED ORDER — LACTATED RINGERS IV SOLN: INTRAVENOUS | Status: DC

## 2022-10-04 MED ORDER — ACETAMINOPHEN 650 MG RE SUPP
650.0000 mg | Freq: Four times a day (QID) | RECTAL | Status: DC | PRN
  Administered 2022-10-04: 650 mg via RECTAL
  Filled 2022-10-04: qty 1

## 2022-10-04 MED ORDER — GUAIFENESIN 100 MG/5ML PO LIQD
20.0000 mL | Freq: Four times a day (QID) | ORAL | Status: DC
  Administered 2022-10-04 – 2022-10-16 (×47): 20 mL
  Administered 2022-10-16: 15 mL
  Administered 2022-10-16 – 2022-10-27 (×44): 20 mL
  Filled 2022-10-04: qty 20
  Filled 2022-10-04: qty 30
  Filled 2022-10-04: qty 20
  Filled 2022-10-04 (×2): qty 30
  Filled 2022-10-04: qty 20
  Filled 2022-10-04 (×3): qty 30
  Filled 2022-10-04: qty 20
  Filled 2022-10-04: qty 30
  Filled 2022-10-04: qty 20
  Filled 2022-10-04 (×3): qty 30
  Filled 2022-10-04: qty 20
  Filled 2022-10-04 (×4): qty 30
  Filled 2022-10-04: qty 20
  Filled 2022-10-04 (×6): qty 30
  Filled 2022-10-04: qty 20
  Filled 2022-10-04: qty 30
  Filled 2022-10-04: qty 20
  Filled 2022-10-04 (×4): qty 30
  Filled 2022-10-04: qty 20
  Filled 2022-10-04 (×3): qty 30
  Filled 2022-10-04: qty 20
  Filled 2022-10-04 (×4): qty 30
  Filled 2022-10-04: qty 20
  Filled 2022-10-04 (×6): qty 30
  Filled 2022-10-04: qty 20
  Filled 2022-10-04 (×2): qty 30
  Filled 2022-10-04: qty 20
  Filled 2022-10-04 (×2): qty 30
  Filled 2022-10-04 (×2): qty 20
  Filled 2022-10-04: qty 30
  Filled 2022-10-04: qty 20
  Filled 2022-10-04 (×4): qty 30
  Filled 2022-10-04: qty 20
  Filled 2022-10-04 (×6): qty 30
  Filled 2022-10-04: qty 20
  Filled 2022-10-04: qty 30
  Filled 2022-10-04 (×2): qty 20
  Filled 2022-10-04 (×5): qty 30
  Filled 2022-10-04 (×3): qty 20
  Filled 2022-10-04 (×2): qty 30
  Filled 2022-10-04: qty 20
  Filled 2022-10-04: qty 30
  Filled 2022-10-04 (×3): qty 20
  Filled 2022-10-04 (×2): qty 30
  Filled 2022-10-04 (×2): qty 20

## 2022-10-04 MED ORDER — VASOPRESSIN 20 UNITS/100 ML INFUSION FOR SHOCK
0.0300 [IU]/min | INTRAVENOUS | Status: DC
  Administered 2022-10-04 (×2): 0.03 [IU]/min via INTRAVENOUS
  Filled 2022-10-04 (×2): qty 100

## 2022-10-04 NOTE — Progress Notes (Signed)
PT head turned to the right at this time.

## 2022-10-04 NOTE — Progress Notes (Signed)
ETT secured with cloth tape at 26 at the lips. Patient proned at this time with head to the left. Patients sats dropped to the mid 80's, increased FiO2 to 80% from 60%. Sats up to 93%. Will continue to monitor.

## 2022-10-04 NOTE — Progress Notes (Signed)
eLink Physician-Brief Progress Note Patient Name: Damon [name] DOB: June 24, 1961 MRN: [id number]   Date of Service  10/04/2022  HPI/Events of Note  Notified of ABG results while proned - 7.27/53/130 on 80%, PEEP 10, RR 35, TV 470.  eICU Interventions  Titrate FiO2.  AM labs ordered as per request.     Intervention Category Intermediate Interventions: Diagnostic test evaluation  Larinda Buttery 10/04/2022, 9:12 PM

## 2022-10-04 NOTE — Progress Notes (Signed)
Chaplain received a consult that patient and family were requesting prayer.  Family was not at bedside at time of visit and Damon [name] was not alert.  Chaplain will attempt again tomorrow, but please page if urgent support needs arise over night.  [address] [place] Pager, [phone]

## 2022-10-04 NOTE — Progress Notes (Signed)
Patient supined at this time with no complications. ETT secured with tube holder.

## 2022-10-04 NOTE — Progress Notes (Signed)
Lab notified of ABG being sent in tube station at this time.

## 2022-10-04 NOTE — Progress Notes (Signed)
Patients head turned to the left at this time. ETT secure.

## 2022-10-04 NOTE — Progress Notes (Signed)
Initial Nutrition Assessment  DOCUMENTATION CODES:   Not applicable  INTERVENTION:   Once able to start TF, recommend advancement of OG tube prior to initiation to ensure side port is gastric: Start Vital 1.5 at 20 ml/h and advance by 10ml q12h to a goal rate of 53ml/hr (1200 ml per day) Prosource TF20 60 ml BID daily  Provides 1960 kcal, 121 gm protein, 917 ml free water daily  NUTRITION DIAGNOSIS:   Inadequate oral intake related to acute illness (respiratory failure/ventilated) as evidenced by NPO status.  GOAL:   Patient will meet greater than or equal to 90% of their needs  MONITOR:   Vent status, Labs, Weight trends, TF tolerance  REASON FOR ASSESSMENT:   Ventilator    ASSESSMENT:   Pt admitted from home with the flu leading to acute respiratory failure with hypoxia and PEA arrest upon admission. PMH significant for HTN, HLD, chronic back pain, tobacco use.  No family present at bedside at time of visit. RN mentioned family in waiting room. Attempted to visit family in waiting room, unable to locate. Unable to obtain detailed nutrition related history.   Spoke with MD in room. Received verbal agreement to start TF today. Recommend advancing OG tube prior to starting TF to ensure side port is gastric. Spoke with RN, reports concern given recent unclamping of OG and output was which is equal to amount of input. No documented BM today either, will hold TF today, place recommendations and reassess tomorrow.   Patient is currently intubated on ventilator support MV: 16.1 L/min Temp (24hrs), Avg:99 F (37.2 C), Min:96.6 F (35.9 C), Max:100.4 F (38 C) MAP (a-line): 75  Pt may likely have a degree of malnutrition, however unable to confirm at this time d/t incomplete nutrition focused physical exam and inability to obtain nutrition related history. Will reassess once able to gather more information.   Medications: colace, SSI 0-15 units TID, protonix,  miralax IV drips: abx, fentanyl versed, levo @ 48.39ml/hr, vaso @ 77ml/hr, vecuronium  Labs: CBG's 70-134 x24 hours  UOP: x24 hours +466ml x5hours I/O's: +4.1L since admit  NUTRITION - FOCUSED PHYSICAL EXAM:  Flowsheet Row Most Recent Value  Orbital Region No depletion  Upper Arm Region No depletion  Thoracic and Lumbar Region No depletion  Buccal Region Unable to assess  Temple Region Unable to assess  Clavicle Bone Region Mild depletion  Clavicle and Acromion Bone Region No depletion  Scapular Bone Region No depletion  Dorsal Hand No depletion  Patellar Region Mild depletion  Anterior Thigh Region Mild depletion  Posterior Calf Region No depletion  Edema (RD Assessment) None  Hair Reviewed  Eyes Unable to assess  Mouth Unable to assess  Skin Reviewed  Nails Reviewed      Diet Order:   Diet Order             Diet NPO time specified  Diet effective now                   EDUCATION NEEDS:   No education needs have been identified at this time  Skin:  Skin Assessment: Reviewed RN Assessment  Last BM:  PTA  Height:   Ht Readings from Last 1 Encounters:  10/03/22 5\' 4"  (1.626 m)    Weight:   Wt Readings from Last 1 Encounters:  10/03/22 64.9 kg   BMI:  Body mass index is 24.56 kg/m.  Estimated Nutritional Needs:   Kcal:  1900-2100  Protein:  95-110g  Fluid:  >/=1.9L  Clayborne Dana, RDN, LDN Clinical Nutrition

## 2022-10-04 NOTE — Progress Notes (Signed)
eLink Physician-Brief Progress Note Patient Name: Lavern Maslow DOB: [birth year] MRN: [id number]   Date of Service  10/04/2022  HPI/Events of Note  Patient having spikes in BIS, BP & HR. HR as high as 150's, BP goes from 110-130's, BIS goes from 30s to 60's. Sedation is almost maxed. Unsure why he is having these spikes. Not shivering, no fever.  eICU Interventions  Discussed with RN. Possible causes may be stimulation and temperature trending up Blanket to be removed, less stimulation as much as possible. Family noted to be at bedside     Intervention Category Intermediate Interventions: Other:  Darl Pikes 10/04/2022, 1:50 AM

## 2022-10-04 NOTE — Progress Notes (Signed)
PHARMACY - PHYSICIAN COMMUNICATION CRITICAL VALUE ALERT - BLOOD CULTURE IDENTIFICATION (BCID)  Damon [name] is an 61 y.o. male who presented to [facility] on 10/03/2022 with a chief complaint of shortness of breath & flu-like symptoms.   Assessment:   61 yo M admitted with respiratory failure secondary to influenza A + PNA.  Currently intubated after PEA arrest this admission.  He is currently afebrile on broad spectrum antibiotics. Strep PNA urinary antigen was +.  Aerobic bottle of 1 blood cx set also + GPC.  BCID + Staph epi and staph lugdunensis without any gene resistance.    Name of physician (or Provider) Contacted: [place]  Current antibiotics: Rocephin + Flagyl + Zithromax  Changes to prescribed antibiotics recommended:  Continue current regimen for now. Consider narrow abx therapy once 2nd blood cx set available   Results for orders placed or performed during the hospital encounter of 10/03/22  Blood Culture ID Panel (Reflexed) (Collected: 10/03/2022  4:27 AM)  Result Value Ref Range   Enterococcus faecalis NOT DETECTED NOT DETECTED   Enterococcus Faecium NOT DETECTED NOT DETECTED   Listeria monocytogenes NOT DETECTED NOT DETECTED   Staphylococcus species DETECTED (A) NOT DETECTED   Staphylococcus aureus (BCID) NOT DETECTED NOT DETECTED   Staphylococcus epidermidis DETECTED (A) NOT DETECTED   Staphylococcus lugdunensis DETECTED (A) NOT DETECTED   Streptococcus species NOT DETECTED NOT DETECTED   Streptococcus agalactiae NOT DETECTED NOT DETECTED   Streptococcus pneumoniae NOT DETECTED NOT DETECTED   Streptococcus pyogenes NOT DETECTED NOT DETECTED   A.calcoaceticus-baumannii NOT DETECTED NOT DETECTED   Bacteroides fragilis NOT DETECTED NOT DETECTED   Enterobacterales NOT DETECTED NOT DETECTED   Enterobacter cloacae complex NOT DETECTED NOT DETECTED   Escherichia coli NOT DETECTED NOT DETECTED   Klebsiella aerogenes NOT DETECTED NOT DETECTED   Klebsiella oxytoca NOT  DETECTED NOT DETECTED   Klebsiella pneumoniae NOT DETECTED NOT DETECTED   Proteus species NOT DETECTED NOT DETECTED   Salmonella species NOT DETECTED NOT DETECTED   Serratia marcescens NOT DETECTED NOT DETECTED   Haemophilus influenzae NOT DETECTED NOT DETECTED   Neisseria meningitidis NOT DETECTED NOT DETECTED   Pseudomonas aeruginosa NOT DETECTED NOT DETECTED   Stenotrophomonas maltophilia NOT DETECTED NOT DETECTED   Candida albicans NOT DETECTED NOT DETECTED   Candida auris NOT DETECTED NOT DETECTED   Candida glabrata NOT DETECTED NOT DETECTED   Candida krusei NOT DETECTED NOT DETECTED   Candida parapsilosis NOT DETECTED NOT DETECTED   Candida tropicalis NOT DETECTED NOT DETECTED   Cryptococcus neoformans/gattii NOT DETECTED NOT DETECTED   Methicillin resistance mecA/C NOT DETECTED NOT DETECTED    Junita Push PharmD 10/04/2022  1:53 AM

## 2022-10-04 NOTE — Consult Note (Signed)
NAME:  Damon [name], MRN:  [id number], DOB:  Jun 02, 1961, LOS: 1 ADMISSION DATE:  10/03/2022, CONSULTATION DATE:  10/03/22 REFERRING MD:  Ronaldo MiyamotoKyle CHIEF COMPLAINT:  Resp Failure   History of Present Illness:  Damon Rushingommy Granholm is a 61 y.o. male who has a PMH as below. He presented to [facility] urgent care 12/18 with confusion, worsening dyspnea after "recent respiratory infection", cough, congestion, fever, chills. On day of presentation, he began to stumble and almost fall and also had an episode of incontinence which is apparently not normal for him. Hee was hypoxic to 88% on RA and was placed on 5L O2 via [place] before being [facility] for further evaluation and management.  He was seen by EDP early AM 12/19 and his flu swab was noted to be positive for flu A. He was discharged home from ED.  He then returned early AM 12/20 with persistent cough, N/V, worsening dyspnea. He was found to have O2 sat of 85% and was placed on BiPAP for increased WOB. CXR showed bilateral infiltrates R > L.  [facility] and was continued on BiPAP with CAP coverage initiation. A few hours after admission, he became agitated, pulling at lines and his BiPAP mask to the point that he continually ripped it off. He was then noted to have bradycardia and became unresponsive. A code blue was called. He was found in PEA and had roughly 5 minutes of ACLS before ROSC. During intubation, he had a 2nd brief arrest, presumed respiratory after an aspiration event.  Post code, he was minimally responsive (no sedation for intubation). Repeat labs were sent.   PCCM ultimately called for admission.    Pertinent  Medical History:  has Acute respiratory failure with hypoxia (HCC); CAP (community acquired pneumonia); Influenza and pneumonia; Elevated LFTs; HTN (hypertension); HLD (hyperlipidemia); Tobacco abuse; SIRS (systemic inflammatory response syndrome) (HCC); Influenza A; Cardiac arrest due to respiratory disorder (HCC); and Tobacco  dependence on their problem list.  Significant Hospital Events: Including procedures, antibiotic start and stop dates in addition to other pertinent events   12/18 flu A positive 12/20 admitted, 5 minute code after non-compliance with BiPAP  Interim History / Subjective:  Minimally responsive after intubation. Did not receive any sedation.  Objective:  Blood pressure (!) 100/54, pulse (!) 137, temperature 100.2 F (37.9 C), resp. rate (!) 35, height 5\' 4"  (1.626 m), weight 64.9 kg, SpO2 100 %.    Vent Mode: PRVC FiO2 (%):  [50 %-100 %] 80 % Set Rate:  [12 bmp-35 bmp] 35 bmp Vt Set:  [410 mL-470 mL] 470 mL PEEP:  [8 cmH20-10 cmH20] 10 cmH20 Plateau Pressure:  [21 cmH20-26 cmH20] 26 cmH20   Intake/Output Summary (Last 24 hours) at 10/04/2022 0953 Last data filed at 10/04/2022 0800 Gross per 24 hour  Intake 5001.07 ml  Output 1325 ml  Net 3676.07 ml   Filed Weights   10/03/22 0359  Weight: 64.9 kg    Examination: General: Adult male, resting in bed, in NAD. Neuro: Minimally responsive, does not follow any commands. HEENT: White/AT. Sclerae anicteric. ETT in place. Cardiovascular: RRR, no M/R/G.  Lungs: Respirations even and unlabored.  Coarse bilaterally R > L. Abdomen: BS x 4, soft, NT/ND.  Musculoskeletal: No gross deformities, no edema.  Skin: Intact, warm, no rashes.  Labs/imaging personally reviewed:  CXR 12/20 > bilateral opacities R > L.  Assessment & Plan:  Severe ARDS due to flu and post flu bacterial pneumonia: Strep urinary antigen  positive. -- PF ratio improved to 163 this AM with overnight proning, supinate 10 am after 16 hrs, serial blood gases -- PRVC, currently 8 cc/kg, respiratory 35, repeat blood gas and decrease tidal volume as able, goal pH 7.2 or higher, pH 7.17 on 7 cc/kg but returned to 8 cc/kg prior to supinating, decrease Vt as able absed on serial blood gases -- Goal PaO2 55-80, wean FiO2 of over 80 -- Current PEEP of 10 with a driving pressure  of 20-35, current compliance is reasonable -- Continue ceftriaxone, Flagyl in the setting of positive strep urinary antigen and aspiration event --Tamiflu    AKI (resolved): Suspect prerenal in the setting of poor p.o. intake while ill, ATN in the setting of developing shock. -- Cr improved s/p fluid administration   Septic shock: Mild hypotension with some contribution of sedatives. Pneumonia source. -- MAP over the 65, norepinephrine ordered   Toxic metabolic encephalopathy: Postarrest he had really no meaningful interaction.  Brainstem reflexes intact.  Now with severe ARDS requires deep sedation. -- RASS goal -4-5, midazolam drip given hypotension, fentanyl drip, as needed fentanyl and midazolam -- Consider CT head once clinically stable, his severe hypoxemia and respiratory failure limit ability to transport at this time.  Severe Protein calorie malnutrition: --start TF 12/21   PEA arrest: Likely respiratory event related to above.   Best practice (evaluated daily):  Diet/type: tubefeeds DVT prophylaxis: LMWH GI prophylaxis: H2B Lines: N/A Foley:  N/A Code Status:  full code Last date of multidisciplinary goals of care discussion: family updated at length at bedside  Labs   CBC: Recent Labs  Lab 10/02/22 0323 10/03/22 0427 10/03/22 1331 10/03/22 1811 10/04/22 0405  WBC 9.5 2.6* 2.0*  --  9.1  NEUTROABS 7.5 2.2  --   --   --   HGB 11.6* 12.1* 11.6* 12.6* 12.0*  HCT 35.3* 36.2* 35.8* 37.0* 37.0*  MCV 83.3 82.1 84.4  --  85.1  PLT 181 154 162  --  158     Basic Metabolic Panel: Recent Labs  Lab 10/02/22 0323 10/03/22 0427 10/03/22 1331 10/03/22 1811 10/04/22 0405  NA 129* 134* 139 137 137  K 4.0 3.8 3.6 4.4 4.5  CL 98 105 109  --  108  CO2 22 19* 19*  --  21*  GLUCOSE 101* 93 80  --  118*  BUN 25* 19 18  --  19  CREATININE 1.36* 0.98 1.24  --  1.09  CALCIUM 8.2* 7.6* 7.3*  --  7.3*  MG  --   --  1.9  --   --   PHOS  --   --  6.5*  --   --      GFR: Estimated Creatinine Clearance: 60.3 mL/min (by C-G formula based on SCr of 1.09 mg/dL). Recent Labs  Lab 10/02/22 0323 10/03/22 0427 10/03/22 1331 10/04/22 0405  PROCALCITON  --  7.05  --  139.10  WBC 9.5 2.6* 2.0* 9.1  LATICACIDVEN  --  1.4 5.8*  --      Liver Function Tests: Recent Labs  Lab 10/03/22 0427 10/04/22 0405  AST 82* 71*  ALT 35 36  ALKPHOS 48 47  BILITOT 1.0 0.6  PROT 5.7* 5.3*  ALBUMIN 2.4* 2.1*    No results for input(s): "LIPASE", "AMYLASE" in the last 168 hours. No results for input(s): "AMMONIA" in the last 168 hours.  ABG    Component Value Date/Time   PHART 7.17 (LL) 10/04/2022 0914   PCO2ART  68 (HH) 10/04/2022 0914   PO2ART 131 (H) 10/04/2022 0914   HCO3 24.3 10/04/2022 0914   TCO2 22 10/03/2022 1811   ACIDBASEDEF 5.2 (H) 10/04/2022 0914   O2SAT 99.1 10/04/2022 0914     Coagulation Profile: Recent Labs  Lab 10/03/22 0427 10/04/22 0405  INR 1.2 1.4*     Cardiac Enzymes: No results for input(s): "CKTOTAL", "CKMB", "CKMBINDEX", "TROPONINI" in the last 168 hours.  HbA1C: Hgb A1c MFr Bld  Date/Time Value Ref Range Status  10/03/2022 01:31 PM 5.9 (H) 4.8 - 5.6 % Final    Comment:    (NOTE)         Prediabetes: 5.7 - 6.4         Diabetes: >6.4         Glycemic control for adults with diabetes: <7.0     CBG: Recent Labs  Lab 10/03/22 1725 10/03/22 1935 10/04/22 0002 10/04/22 0412 10/04/22 0750  GLUCAP 125* 104* 117* 117* 134*     Review of Systems:   Unable to obtain as pt is encephalopathic.  Past Medical History:  He,  has a past medical history of Elevated lipids and Hypertension.   Surgical History:   Past Surgical History:  Procedure Laterality Date   INCISION AND DRAINAGE Left 10/06/2013   Procedure: INCISION AND DRAINAGE LEFT LONG FINGER;  Surgeon: Tami Ribas, MD;  Location: [facility];  Service: Orthopedics;  Laterality: Left;   STERIOD INJECTION  2005back     Social  History:   reports that he has been smoking cigarettes. He has a 20.00 pack-year smoking history. He has never used smokeless tobacco. He reports that he does not drink alcohol and does not use drugs.   Family History:  His family history includes Cancer in his brother; Colon cancer (age of onset: 54) in his mother; Diabetes in his mother and sister; Heart attack in his brother; Lung cancer in his father.   Allergies No Known Allergies   Home Medications  Prior to Admission medications   Medication Sig Start Date End Date Taking? Authorizing Provider  acetaminophen (TYLENOL) 500 MG tablet Take 1 tablet (500 mg total) by mouth every 6 (six) hours as needed. 10/02/22   Redwine, Madison A, PA-C  amLODipine (NORVASC) 5 MG tablet Take 1 tablet by mouth daily. 10/21/17   [provider]  atorvastatin (LIPITOR) 10 MG tablet Take 1 tablet by mouth daily. 11/23/17   [provider]  benzonatate (TESSALON) 100 MG capsule Take 1 capsule (100 mg total) by mouth every 8 (eight) hours. 10/02/22   Redwine, Madison A, PA-C  ibuprofen (ADVIL) 600 MG tablet Take 1 tablet (600 mg total) by mouth every 6 (six) hours as needed. 10/02/22   Redwine, Madison A, PA-C  ondansetron (ZOFRAN) 4 MG tablet Take 1 tablet (4 mg total) by mouth every 6 (six) hours. 10/02/22   Redwine, Madison A, PA-C  pantoprazole (PROTONIX) 40 MG tablet Take 1 tablet by mouth daily. 10/21/17   [provider]     Critical care time:   CRITICAL CARE Performed by: Karren Burly   Total critical care time: 60 minutes  Critical care time was exclusive of separately billable procedures and treating other patients.  Critical care was necessary to treat or prevent imminent or life-threatening deterioration.  Critical care was time spent personally by me on the following activities: development of treatment plan with patient and/or surrogate as well as nursing, discussions with consultants, evaluation of patient's  response to treatment, examination of patient, obtaining history from patient or surrogate, ordering and performing treatments and interventions, ordering and review of laboratory studies, ordering and review of radiographic studies, pulse oximetry and re-evaluation of patient's condition.   Karren Burly, MD [facility] For pager details, please see AMION or use Epic chat  After 1900, please call [facility] for cross coverage needs 10/04/2022, 9:53 AM

## 2022-10-04 NOTE — Progress Notes (Signed)
Overnight during repositioning, pt experienced vital sign changes when his head was turned to the left vs when it was turned to the right.  Pt BP, HR are extremely elevated along with frequent BIS changes when pt head is turned to the left. However, pt vital signs are tremendously better when pt head is turned to the right.   Pt pt wife and niece, the pt favors his right side when sleeping at home. This could be a possible reason for the vital sign shifts  Levo gtt restarted when pt head is turned to right side and turned off when pt head is to left side.

## 2022-10-04 NOTE — Progress Notes (Signed)
eLink Physician-Brief Progress Note Patient Name: Damon [name] DOB: 1961-07-05 MRN: [id number]   Date of Service  10/04/2022  HPI/Events of Note  Notified that meds given via OGT including tamiflu and docusate were not fully digested.  Ogt was clamped for 1.5hrs.   eICU Interventions  Recommended for the nurse to continue clamping OGT and not back to LIWS.      Intervention Category Intermediate Interventions: Other:  Larinda Buttery 10/04/2022, 11:30 PM

## 2022-10-05 ENCOUNTER — Inpatient Hospital Stay (HOSPITAL_COMMUNITY): Payer: Medicare Other

## 2022-10-05 DIAGNOSIS — J9601 Acute respiratory failure with hypoxia: Secondary | ICD-10-CM | POA: Diagnosis not present

## 2022-10-05 DIAGNOSIS — J101 Influenza due to other identified influenza virus with other respiratory manifestations: Secondary | ICD-10-CM | POA: Diagnosis not present

## 2022-10-05 LAB — BLOOD GAS, ARTERIAL
Acid-Base Excess: 0 mmol/L (ref 0.0–2.0)
Acid-base deficit: 2.2 mmol/L — ABNORMAL HIGH (ref 0.0–2.0)
Acid-base deficit: 1.6 mmol/L (ref 0.0–2.0)
Acid-base deficit: 2.8 mmol/L — ABNORMAL HIGH (ref 0.0–2.0)
Bicarbonate: 24.8 mmol/L (ref 20.0–28.0)
Bicarbonate: 23.7 mmol/L (ref 20.0–28.0)
Bicarbonate: 25.6 mmol/L (ref 20.0–28.0)
Bicarbonate: 24 mmol/L (ref 20.0–28.0)
FIO2: 60 %
MECHVT: 410 mL
O2 Content: 10 L/min
O2 Saturation: 100 %
O2 Saturation: 99.4 %
O2 Saturation: 100 %
O2 Saturation: 100 %
PEEP: 10 cmH2O
Patient temperature: 37
Patient temperature: 36
Patient temperature: 37
Patient temperature: 36.3
RATE: 35 resp/min
pCO2 arterial: 40 mmHg (ref 32–48)
pCO2 arterial: 42 mmHg (ref 32–48)
pCO2 arterial: 52 mmHg — ABNORMAL HIGH (ref 32–48)
pCO2 arterial: 48 mmHg (ref 32–48)
pH, Arterial: 7.4 (ref 7.35–7.45)
pH, Arterial: 7.35 (ref 7.35–7.45)
pH, Arterial: 7.3 — ABNORMAL LOW (ref 7.35–7.45)
pH, Arterial: 7.3 — ABNORMAL LOW (ref 7.35–7.45)
pO2, Arterial: 152 mmHg — ABNORMAL HIGH (ref 83–108)
pO2, Arterial: 86 mmHg (ref 83–108)
pO2, Arterial: 103 mmHg (ref 83–108)
pO2, Arterial: 109 mmHg — ABNORMAL HIGH (ref 83–108)

## 2022-10-05 LAB — BASIC METABOLIC PANEL
Anion gap: 5 (ref 5–15)
BUN: 25 mg/dL — ABNORMAL HIGH (ref 6–20)
CO2: 23 mmol/L (ref 22–32)
Calcium: 7.7 mg/dL — ABNORMAL LOW (ref 8.9–10.3)
Chloride: 111 mmol/L (ref 98–111)
Creatinine, Ser: 1.13 mg/dL (ref 0.61–1.24)
GFR, Estimated: >60 mL/min (ref >60)
Glucose, Bld: 116 mg/dL — ABNORMAL HIGH (ref 70–99)
Potassium: 4 mmol/L (ref 3.5–5.1)
Sodium: 139 mmol/L (ref 135–145)

## 2022-10-05 LAB — CBC
HCT: 30.6 % — ABNORMAL LOW (ref 39.0–52.0)
Hemoglobin: 10 g/dL — ABNORMAL LOW (ref 13.0–17.0)
MCH: 27.5 pg (ref 26.0–34.0)
MCHC: 32.7 g/dL (ref 30.0–36.0)
MCV: 84.1 fL (ref 80.0–100.0)
Platelets: 206 10*3/uL (ref 150–400)
RBC: 3.64 MIL/uL — ABNORMAL LOW (ref 4.22–5.81)
RDW: 16.5 % — ABNORMAL HIGH (ref 11.5–15.5)
WBC: 18.5 10*3/uL — ABNORMAL HIGH (ref 4.0–10.5)
nRBC: 0 % (ref 0.0–0.2)

## 2022-10-05 LAB — CULTURE, RESPIRATORY W GRAM STAIN: Culture: NORMAL

## 2022-10-05 LAB — GLUCOSE, CAPILLARY
Glucose-Capillary: 106 mg/dL — ABNORMAL HIGH (ref 70–99)
Glucose-Capillary: 115 mg/dL — ABNORMAL HIGH (ref 70–99)
Glucose-Capillary: 156 mg/dL — ABNORMAL HIGH (ref 70–99)
Glucose-Capillary: 78 mg/dL (ref 70–99)
Glucose-Capillary: 80 mg/dL (ref 70–99)
Glucose-Capillary: 90 mg/dL (ref 70–99)
Glucose-Capillary: 99 mg/dL (ref 70–99)

## 2022-10-05 LAB — PROCALCITONIN: Procalcitonin: 92.92 ng/mL

## 2022-10-05 MED ORDER — LABETALOL HCL 5 MG/ML IV SOLN
10.0000 mg | INTRAVENOUS | Status: AC | PRN
  Administered 2022-10-06: 10 mg via INTRAVENOUS
  Filled 2022-10-05: qty 4

## 2022-10-05 MED ORDER — METOPROLOL TARTRATE 5 MG/5ML IV SOLN
INTRAVENOUS | Status: AC
  Filled 2022-10-05: qty 5

## 2022-10-05 MED ORDER — VITAL HIGH PROTEIN PO LIQD: 1000.0000 mL | ORAL | Status: DC

## 2022-10-05 MED ORDER — PROSOURCE TF20 ENFIT COMPATIBL EN LIQD
60.0000 mL | Freq: Two times a day (BID) | ENTERAL | Status: DC
  Administered 2022-10-05 – 2023-01-22 (×217): 60 mL
  Filled 2022-10-05 (×219): qty 60

## 2022-10-05 MED ORDER — VECURONIUM BROMIDE 10 MG IV SOLR
10.0000 mg | Freq: Once | INTRAVENOUS | Status: AC
  Administered 2022-10-05: 10 mg via INTRAVENOUS
  Filled 2022-10-05: qty 10

## 2022-10-05 MED ORDER — MIDAZOLAM HCL 2 MG/2ML IJ SOLN: 2.0000 mg | INTRAMUSCULAR | Status: DC | PRN

## 2022-10-05 MED ORDER — FENTANYL 2500MCG IN NS 250ML (10MCG/ML) PREMIX INFUSION
50.0000 ug/h | INTRAVENOUS | Status: DC
  Administered 2022-10-05: 250 ug/h via INTRAVENOUS
  Administered 2022-10-06: 275 ug/h via INTRAVENOUS
  Administered 2022-10-06: 225 ug/h via INTRAVENOUS
  Administered 2022-10-07: 275 ug/h via INTRAVENOUS
  Administered 2022-10-07 – 2022-10-08 (×4): 300 ug/h via INTRAVENOUS
  Filled 2022-10-05 (×8): qty 250

## 2022-10-05 MED ORDER — FUROSEMIDE 10 MG/ML IJ SOLN
40.0000 mg | Freq: Once | INTRAMUSCULAR | Status: AC
  Administered 2022-10-05: 40 mg via INTRAVENOUS
  Filled 2022-10-05: qty 4

## 2022-10-05 MED ORDER — MIDAZOLAM BOLUS VIA INFUSION
5.0000 mg | INTRAVENOUS | Status: DC | PRN
  Administered 2022-10-05 – 2022-10-08 (×9): 5 mg via INTRAVENOUS

## 2022-10-05 MED ORDER — ROCURONIUM BROMIDE 10 MG/ML (PF) SYRINGE
1.0000 mg/kg | PREFILLED_SYRINGE | Freq: Once | INTRAVENOUS | Status: AC
  Administered 2022-10-05: 64.9 mg via INTRAVENOUS
  Filled 2022-10-05: qty 10
  Filled 2022-10-05 (×2): qty 6.49

## 2022-10-05 MED ORDER — METOPROLOL TARTRATE 5 MG/5ML IV SOLN
2.5000 mg | INTRAVENOUS | Status: DC | PRN
  Administered 2022-10-05: 5 mg via INTRAVENOUS

## 2022-10-05 MED ORDER — AMLODIPINE BESYLATE 5 MG PO TABS
5.0000 mg | ORAL_TABLET | Freq: Every day | ORAL | Status: DC
  Administered 2022-10-05: 5 mg
  Filled 2022-10-05 (×2): qty 1

## 2022-10-05 MED ORDER — VITAL 1.5 CAL PO LIQD
1000.0000 mL | ORAL | Status: DC
  Administered 2022-10-05 – 2022-10-23 (×25): 1000 mL
  Filled 2022-10-05 (×9): qty 1000

## 2022-10-05 MED ORDER — HYDRALAZINE HCL 20 MG/ML IJ SOLN
10.0000 mg | INTRAMUSCULAR | Status: DC | PRN
  Administered 2022-10-05 – 2022-10-07 (×3): 40 mg via INTRAVENOUS
  Filled 2022-10-05 (×2): qty 1
  Filled 2022-10-05: qty 2

## 2022-10-05 MED ORDER — HYDRALAZINE HCL 20 MG/ML IJ SOLN
INTRAMUSCULAR | Status: AC
  Filled 2022-10-05: qty 2

## 2022-10-05 MED ORDER — FENTANYL BOLUS VIA INFUSION
200.0000 ug | INTRAVENOUS | Status: DC | PRN
  Administered 2022-10-06 – 2022-10-08 (×13): 200 ug via INTRAVENOUS

## 2022-10-05 NOTE — Progress Notes (Signed)
Chaplain responded to spiritual consult for prayer.  Patient not alert.  Family not present. Chaplain offered silent prayer. She is available if family returns. Rev. Lynnell Chad Pager [phone]

## 2022-10-05 NOTE — Progress Notes (Signed)
Turned head to the left at this time.

## 2022-10-05 NOTE — Progress Notes (Signed)
Patient supined at this time with no complications. ETT secured with tube holder.  

## 2022-10-05 NOTE — Progress Notes (Signed)
NAME:  Damon [name], MRN:  [id number], DOB:  Jul 29, 1961, LOS: 2 ADMISSION DATE:  10/03/2022, CONSULTATION DATE:  10/03/22 REFERRING MD: Dr. Marylyn Ishihara CHIEF COMPLAINT:  Resp Failure   History of Present Illness:  61 y/o M who has a PMH as below. He presented to [facility] urgent care 12/18 with confusion, worsening dyspnea after "recent respiratory infection", cough, congestion, fever, chills. On day of presentation, he began to stumble and almost fall and also had an episode of incontinence which is apparently not normal for him. He was hypoxic to 88% on RA and was placed on 5L O2 via [place] before being sent to [facility] ED for further evaluation and management.  He was seen by EDP early AM 12/19 and his flu swab was noted to be positive for flu A. He was discharged home from ED.  He then returned early AM 12/20 with persistent cough, N/V, worsening dyspnea. He was found to have O2 sat of 85% and was placed on BiPAP for increased WOB. CXR showed bilateral infiltrates R > L.  He was admitted by [facility] and was continued on BiPAP with CAP coverage initiation. A few hours after admission, he became agitated, pulling at lines and his BiPAP mask to the point that he continually ripped it off. He was then noted to have bradycardia and became unresponsive. A code blue was called. He was found in PEA and had roughly 5 minutes of ACLS before ROSC. During intubation, he had a 2nd brief arrest, presumed respiratory after an aspiration event.  Post code, he was minimally responsive (no sedation for intubation). Repeat labs were sent.   PCCM ultimately called for admission.  Pertinent  Medical History:  has Acute respiratory failure with hypoxia ([place]); CAP (community acquired pneumonia); Influenza and pneumonia; Elevated LFTs; HTN (hypertension); HLD (hyperlipidemia); Tobacco abuse; SIRS (systemic inflammatory response syndrome) ([place]); Influenza A; Cardiac arrest due to respiratory disorder ([place]); and Tobacco dependence on their  problem list.  Significant Hospital Events: Including procedures, antibiotic start and stop dates in addition to other pertinent events   12/18 flu A positive 12/20 admitted, 5 minute code after non-compliance with BiPAP 12/21 Intubated, paralyzed, ARDS protocol, on 7cc/kg  Interim History / Subjective:  Tmax 99 / WBC 18.5  Vent 60%, PEEP 10 I/O 1L UOP, +3.3L in last 24 hours  RN reports pt on versed 10mg , fent 200 mcg, vec 0.4.  Pending turn to supine   Objective:  Blood pressure (!) 100/54, pulse (!) 101, temperature 99 F (37.2 C), resp. rate (!) 35, height 5\' 4"  (1.626 m), weight 64.9 kg, SpO2 100 %. CVP:  [5 mmHg-12 mmHg] 12 mmHg  Vent Mode: PRVC FiO2 (%):  [60 %-80 %] 60 % Set Rate:  [35 bmp] 35 bmp Vt Set:  [410 mL-470 mL] 470 mL PEEP:  [10 cmH20] 10 cmH20 Plateau Pressure:  [22 cmH20-23 cmH20] 22 cmH20   Intake/Output Summary (Last 24 hours) at 10/05/2022 0737 Last data filed at 10/05/2022 0145 Gross per 24 hour  Intake 4588.52 ml  Output 1225 ml  Net 3363.52 ml   Filed Weights   10/03/22 0359  Weight: 64.9 kg    Examination: General: critically ill appearing adult male lying in bed in NAD HEENT: MM pink/moist, ETT, anicteric, mild scleral edema & trace hemorrhage Neuro: sedate, paralyzed CV: s1s2 RRR, ST, no m/r/g PULM: non-labored at rest, bronchial breath sounds, peak pressures 24, pPlat 18, DP 8, PF 253 GI: soft, bsx4 active  Extremities: warm/dry, no edema  Skin:  no rashes or lesions   BC with GPC's >> Resp Culture >>  UC >> neg  Resolved Hospital Issues  Septic Shock secondary to Influenza, Strep Pneumoniae PNA   Assessment & Plan:   Severe ARDS due to Influenza A and post flu bacterial pneumonia: Strep urinary antigen positive. -PRVC LTVV: abg reviewed, reduce Vt to 7cc and repeat blood gas -goal pH 7.2 or greater  -wean PEEP / FiO2 for PaO2 55-80 -goal plateau pressure <30, driving pressure <19 -continue ceftriaxone -tamiflu  -assess CXR  post supination   AKI  Suspect prerenal in the setting of poor p.o. intake while ill, ATN in the setting of developing shock. -LR at 100 ml/hr, if tolerating TF stop IVF in am  -Trend BMP / urinary output -Replace electrolytes as indicated -Avoid nephrotoxic agents, ensure adequate renal perfusion   Toxic Metabolic Encephalopathy Post arrest no meaningful interaction. Brainstem reflexes intact. Improving ARDS.  -continue RASS Goal -4 to -5 -continue fentanyl, versed  -stop paralytics pending review of ABG -may need PRN paralytic push -CT head once clinically stable  Anemia  -trend CBC   Severe Protein Calorie Malnutrition  -begin trickle TF    PEA Arrest Suspect related to primary respiratory event -supportive care / tele monitoring   GPC's 1/4 Bottles  -suspect contaminant  -follow cultures   Best practice (evaluated daily):  Diet/type: tubefeeds DVT prophylaxis: LMWH GI prophylaxis: PPI Lines: Central line Foley:  Yes, and it is still needed Code Status:  full code Last date of multidisciplinary goals of care discussion: pending updated 12/22 on family arrival  Critical care time: 35 minutes   Canary Brim, MSN, APRN, NP-C, AGACNP-BC [facility] 10/05/2022, 7:37 AM   Please see [email or web address] for pager details.   From 7A-7P if no response, please call [phone] After hours, please call ELink [phone]

## 2022-10-05 NOTE — Progress Notes (Signed)
Nutrition Follow-up  INTERVENTION:   -Initiate Vital 1.5 @ 20 ml/hr via OGT, advance by 10 ml every 12 hours to goal rate of 50 ml/hr (1200 ml per day) -60 ml Prosource TF 20 BID -Provides 1960 kcals, 121g protein and 917 ml H2O  NUTRITION DIAGNOSIS:   Inadequate oral intake related to acute illness (respiratory failure/ventilated) as evidenced by NPO status.  Ongoing.  GOAL:   Patient will meet greater than or equal to 90% of their needs  Not meeting yet.  MONITOR:   Vent status, Labs, Weight trends, TF tolerance  REASON FOR ASSESSMENT:   Consult Enteral/tube feeding initiation and management  ASSESSMENT:   Pt admitted from home with the flu leading to acute respiratory failure with hypoxia and PEA arrest upon admission. PMH significant for HTN, HLD, chronic back pain, tobacco use.  RD consulted to start tube feeds today. Will slowly advance towards goal.   Patient is currently intubated on ventilator support MV: 16.4 L/min Temp (24hrs), Avg:99.2 F (37.3 C), Min:97.5 F (36.4 C), Max:100 F (37.8 C)  Admission weight: 143 lbs Daily weights are ordered  via TF protocol.  Medications: Colace, Miralax, Fentanyl, Lactated ringers, Versed  Labs reviewed: CBGs: 99-157   Diet Order:   Diet Order             Diet NPO time specified  Diet effective now                   EDUCATION NEEDS:   No education needs have been identified at this time  Skin:  Skin Assessment: Reviewed RN Assessment  Last BM:  PTA  Height:   Ht Readings from Last 1 Encounters:  10/03/22 5\' 4"  (1.626 m)    Weight:   Wt Readings from Last 1 Encounters:  10/03/22 64.9 kg    BMI:  Body mass index is 24.56 kg/m.  Estimated Nutritional Needs:   Kcal:  1900-2100  Protein:  95-110g  Fluid:  >/=1.9L   10/05/22, MS, RD, LDN Inpatient Clinical Dietitian Contact information available via Amion

## 2022-10-05 NOTE — Progress Notes (Signed)
Patients head turned to the left at this time with no complications. ETT secured.

## 2022-10-05 NOTE — Progress Notes (Signed)
eLink Physician-Brief Progress Note Patient Name: Damon [name] DOB: 07/06/1961 MRN: [id number]   Date of Service  10/05/2022  HPI/Events of Note  Patient dyssynchronous on the ventilator on versed gtt at 10 mg / hour ad Fentanyl gtt at 200 mcg / hour.  eICU Interventions  Fentanyl gtt rate ceiling increased to 300 mcg and patient given 1 mg / kg of Rocuronium iv x 1.        Thomasene Lot Amaura Authier 10/05/2022, 10:10 PM

## 2022-10-06 ENCOUNTER — Inpatient Hospital Stay (HOSPITAL_COMMUNITY): Payer: Medicare Other

## 2022-10-06 DIAGNOSIS — J9601 Acute respiratory failure with hypoxia: Secondary | ICD-10-CM | POA: Diagnosis not present

## 2022-10-06 LAB — GLUCOSE, CAPILLARY
Glucose-Capillary: 102 mg/dL — ABNORMAL HIGH (ref 70–99)
Glucose-Capillary: 103 mg/dL — ABNORMAL HIGH (ref 70–99)
Glucose-Capillary: 111 mg/dL — ABNORMAL HIGH (ref 70–99)
Glucose-Capillary: 122 mg/dL — ABNORMAL HIGH (ref 70–99)
Glucose-Capillary: 123 mg/dL — ABNORMAL HIGH (ref 70–99)
Glucose-Capillary: 97 mg/dL (ref 70–99)

## 2022-10-06 LAB — CBC
HCT: 29.2 % — ABNORMAL LOW (ref 39.0–52.0)
Hemoglobin: 9.4 g/dL — ABNORMAL LOW (ref 13.0–17.0)
MCH: 27.2 pg (ref 26.0–34.0)
MCHC: 32.2 g/dL (ref 30.0–36.0)
MCV: 84.6 fL (ref 80.0–100.0)
Platelets: 224 10*3/uL (ref 150–400)
RBC: 3.45 MIL/uL — ABNORMAL LOW (ref 4.22–5.81)
RDW: 17.2 % — ABNORMAL HIGH (ref 11.5–15.5)
WBC: 24.1 10*3/uL — ABNORMAL HIGH (ref 4.0–10.5)
nRBC: 0.2 % (ref 0.0–0.2)

## 2022-10-06 LAB — CULTURE, BLOOD (ROUTINE X 2)

## 2022-10-06 LAB — COMPREHENSIVE METABOLIC PANEL
ALT: 26 U/L (ref 0–44)
AST: 50 U/L — ABNORMAL HIGH (ref 15–41)
Albumin: 1.8 g/dL — ABNORMAL LOW (ref 3.5–5.0)
Alkaline Phosphatase: 87 U/L (ref 38–126)
Anion gap: 5 (ref 5–15)
BUN: 34 mg/dL — ABNORMAL HIGH (ref 6–20)
CO2: 26 mmol/L (ref 22–32)
Calcium: 8.1 mg/dL — ABNORMAL LOW (ref 8.9–10.3)
Chloride: 111 mmol/L (ref 98–111)
Creatinine, Ser: 1.19 mg/dL (ref 0.61–1.24)
GFR, Estimated: >60 mL/min (ref >60)
Glucose, Bld: 111 mg/dL — ABNORMAL HIGH (ref 70–99)
Potassium: 3.9 mmol/L (ref 3.5–5.1)
Sodium: 142 mmol/L (ref 135–145)
Total Bilirubin: 0.4 mg/dL (ref 0.3–1.2)
Total Protein: 4.9 g/dL — ABNORMAL LOW (ref 6.5–8.1)

## 2022-10-06 LAB — PROCALCITONIN: Procalcitonin: 69.85 ng/mL

## 2022-10-06 MED ORDER — FUROSEMIDE 10 MG/ML IJ SOLN
40.0000 mg | Freq: Once | INTRAMUSCULAR | Status: AC
  Administered 2022-10-06: 40 mg via INTRAVENOUS
  Filled 2022-10-06: qty 4

## 2022-10-06 MED ORDER — ORAL CARE MOUTH RINSE: 15.0000 mL | OROMUCOSAL | Status: DC | PRN

## 2022-10-06 MED ORDER — VECURONIUM BROMIDE 10 MG IV SOLR
10.0000 mg | Freq: Once | INTRAVENOUS | Status: AC
  Administered 2022-10-06: 10 mg via INTRAVENOUS
  Filled 2022-10-06: qty 10

## 2022-10-06 MED ORDER — ORAL CARE MOUTH RINSE
15.0000 mL | OROMUCOSAL | Status: DC
  Administered 2022-10-06 – 2022-11-22 (×560): 15 mL via OROMUCOSAL

## 2022-10-06 MED ORDER — NOREPINEPHRINE 4 MG/250ML-% IV SOLN
0.0000 ug/min | INTRAVENOUS | Status: DC
  Administered 2022-10-06: 2 ug/min via INTRAVENOUS
  Filled 2022-10-06: qty 250

## 2022-10-06 MED ORDER — AMLODIPINE BESYLATE 5 MG PO TABS: 5.0000 mg | ORAL_TABLET | Freq: Every day | ORAL | Status: DC

## 2022-10-06 MED ORDER — AMLODIPINE BESYLATE 5 MG PO TABS
5.0000 mg | ORAL_TABLET | Freq: Every day | ORAL | Status: DC
  Administered 2022-10-06: 5 mg

## 2022-10-06 MED ORDER — STERILE WATER FOR INJECTION IJ SOLN
INTRAMUSCULAR | Status: AC
  Administered 2022-10-06: 10 mL
  Filled 2022-10-06: qty 10

## 2022-10-06 MED ORDER — ALBUMIN HUMAN 25 % IV SOLN
50.0000 g | Freq: Once | INTRAVENOUS | Status: AC
  Administered 2022-10-06: 50 g via INTRAVENOUS
  Filled 2022-10-06: qty 200

## 2022-10-06 NOTE — Progress Notes (Signed)
Inconsistent and variable cuff leak noted, approx. 100-150cc difference between inspiratory and expiratory volumes. Additional volume added to balloon did not correct issue. Gurgling did not resolve with suctioning. Patient did not experience any episodes of desaturation. MD aware, opted to proactively exchange ET tube since patient needed to be cleaned. Gurgling and inconsistent volumes continued after tube exchange. Per MD, since patient volumes were adequate and O2 saturations were WNL, will hold off on additional intervention unless patient begins to decompensate. Per MD, will assess tomorrow to determine if second tube exchange is indicated. Night shift team made aware

## 2022-10-06 NOTE — Progress Notes (Signed)
eLink Physician-Brief Progress Note Patient Name: Damon [name] DOB: Mar 10, 1961 MRN: [id number]   Date of Service  10/06/2022  HPI/Events of Note  BP is soft, serum albumin 1.8, patient recently diuresed aggressively.  eICU Interventions  Albumin 25 % 50 gm iv x 1 ordered.        Thomasene Lot Emilee Market 10/06/2022, 5:22 AM

## 2022-10-06 NOTE — Progress Notes (Signed)
Attempted to contact wife.  Called listed contact number.  There is no answer.

## 2022-10-06 NOTE — Progress Notes (Signed)
Rt called to ICU assisted Dr. Judeth Horn in switching out the ET tube with a bougie in place. Patient maintained saturation and vitals thru out switch. The ET tube was believed to have an air leak. Patient appears stable at this time and will have on going care from RT.

## 2022-10-06 NOTE — Progress Notes (Signed)
NAME:  Damon [name], MRN:  [id number], DOB:  1961-02-27, LOS: 3 ADMISSION DATE:  10/03/2022, CONSULTATION DATE:  10/03/22 REFERRING MD: Dr. Ronaldo Miyamoto CHIEF COMPLAINT:  Resp Failure   History of Present Illness:  61 y/o M who has a PMH as below. He presented to [facility] urgent care 12/18 with confusion, worsening dyspnea after "recent respiratory infection", cough, congestion, fever, chills. On day of presentation, he began to stumble and almost fall and also had an episode of incontinence which is apparently not normal for him. He was hypoxic to 88% on RA and was placed on 5L O2 via [place] before being sent to [facility] ED for further evaluation and management.  He was seen by EDP early AM 12/19 and his flu swab was noted to be positive for flu A. He was discharged home from ED.  He then returned early AM 12/20 with persistent cough, N/V, worsening dyspnea. He was found to have O2 sat of 85% and was placed on BiPAP for increased WOB. CXR showed bilateral infiltrates R > L.  He was admitted by [facility] and was continued on BiPAP with CAP coverage initiation. A few hours after admission, he became agitated, pulling at lines and his BiPAP mask to the point that he continually ripped it off. He was then noted to have bradycardia and became unresponsive. A code blue was called. He was found in PEA and had roughly 5 minutes of ACLS before ROSC. During intubation, he had a 2nd brief arrest, presumed respiratory after an aspiration event.  Post code, he was minimally responsive (no sedation for intubation). Repeat labs were sent.   PCCM ultimately called for admission.  Pertinent  Medical History:  has Acute respiratory failure with hypoxia (HCC); CAP (community acquired pneumonia); Influenza and pneumonia; Elevated LFTs; HTN (hypertension); HLD (hyperlipidemia); Tobacco abuse; SIRS (systemic inflammatory response syndrome) (HCC); Influenza A; Cardiac arrest due to respiratory disorder (HCC); and Tobacco dependence on their  problem list.  Significant Hospital Events: Including procedures, antibiotic start and stop dates in addition to other pertinent events   12/18 flu A positive 12/20 admitted, 5 minute code after non-compliance with BiPAP 12/21 Intubated, paralyzed, ARDS protocol, on 7cc/kg 12/22 weaned to 6 cc/kg with dyssynchrony, back on 7 cc/kg, driving pressures food, weaning vent  Interim History / Subjective:  WBC going up. FiO2 weaned dow, robust UOP with lasix overnight.  Objective:  Blood pressure (!) 116/55, pulse 90, temperature 97.7 F (36.5 C), resp. rate (!) 35, height 5\' 4"  (1.626 m), weight 69.5 kg, SpO2 95 %. CVP:  [7 mmHg-16 mmHg] 7 mmHg  Vent Mode: PRVC FiO2 (%):  [40 %-60 %] 40 % Set Rate:  [35 bmp] 35 bmp Vt Set:  [350 mL-470 mL] 410 mL PEEP:  [10 cmH20] 10 cmH20 Plateau Pressure:  [15 cmH20-23 cmH20] 22 cmH20   Intake/Output Summary (Last 24 hours) at 10/06/2022 0953 Last data filed at 10/06/2022 0700 Gross per 24 hour  Intake 4937.16 ml  Output 2265 ml  Net 2672.16 ml    Filed Weights   10/03/22 0359 10/06/22 0345  Weight: 64.9 kg 69.5 kg    Examination: General: critically ill appearing adult male lying in bed in NAD HEENT: MM pink/moist, ETT, anicteric, mild scleral edema & trace hemorrhage Neuro: deeply sedated CV: s1s2 RRR, ST, no m/r/g PULM: non-labored at rest, bronchial breath sounds GI: soft, bsx4 active  Extremities: warm/dry, no edema  Skin: no rashes or lesions   BC with 2 species staph >> 1 of  4 bottles Resp Culture >>  UC >> neg  Resolved Hospital Issues  Septic Shock secondary to Influenza and Strep Pneumoniae PNA, AKI   Assessment & Plan:   Severe ARDS due to flu and post flu bacterial pneumonia: Strep urinary antigen positive. -- PF ratio improving -- PRVC, now on 7 cc/kg, ventilation and oxygenation improving -- Paralytic stopped 12/22 -- Goal PaO2 55-80, wean FiO2 of over 80 or SpO2 > 95 -- Current PEEP of 10 with a driving pressure  of <01, current compliance is reasonable, wean PEEP  -- Continue ceftriaxone setting of positive strep urinary antigen, plan 7 days -- Tamiflu x 5 days --Diurese, IV lasix   AKI (resolved): Suspect prerenal in the setting of poor p.o. intake while ill, ATN in the setting of developing shock. -- Cr improved s/p fluid administration, now diuresing   Septic shock (resolved): Mild hypotension with some contribution of sedatives. Pneumonia source. -- MAP over the 65, norepinephrine on and off   Toxic metabolic encephalopathy: Postarrest he had really no meaningful interaction.  Brainstem reflexes intact.  Now with severe ARDS requires deep sedation. -- RASS goal -1 to -2, midazolam drip given hypotension, fentanyl drip, as needed fentanyl and midazolam, wean as able -- Consider CT head once clinically stable if persistent   Severe Protein calorie malnutrition: --start TF 12/22, advance as tolerated   PEA arrest: Likely respiratory event related to above.   Multiple staph species on blood culture: In 1 of 4 bottles. Favored to represent contaminant.   HTN: Bps labile --hold amlodipine given on and off pressors,  Best practice (evaluated daily):  Diet/type: tubefeeds DVT prophylaxis: LMWH GI prophylaxis: PPI Lines: Central line Foley:  Yes, and it is still needed Code Status:  full code Last date of multidisciplinary goals of care discussion: family updated at bedside  Critical care time:    CRITICAL CARE Performed by: Karren Burly   Total critical care time: 38 minutes  Critical care time was exclusive of separately billable procedures and treating other patients.  Critical care was necessary to treat or prevent imminent or life-threatening deterioration.  Critical care was time spent personally by me on the following activities: development of treatment plan with patient and/or surrogate as well as nursing, discussions with consultants, evaluation of patient's response  to treatment, examination of patient, obtaining history from patient or surrogate, ordering and performing treatments and interventions, ordering and review of laboratory studies, ordering and review of radiographic studies, pulse oximetry and re-evaluation of patient's condition.   Karren Burly, MD [facility] 10/06/2022, 9:53 AM   Please see [email or web address] for pager details.   From 7A-7P if no response, please call [phone] After hours, please call ELink [phone]

## 2022-10-06 NOTE — Procedures (Signed)
Intubation Procedure Note  Damon [name]  [id number]  1961/08/27  Date:10/06/22  Time:11:58 AM   Provider Performing:Vertis Scheib R Cindia Hustead    Procedure: Intubation (31500)  Indication(s) Respiratory Failure Malfunctioning ET Tube  Consent Unable to obtain consent due to emergent nature of procedure.   Anesthesia Sedation running for mechanical ventilation   Time Out Verified patient identification, verified procedure, site/side was marked, verified correct patient position, special equipment/implants available, medications/allergies/relevant history reviewed, required imaging and test results available.   Sterile Technique Usual hand hygeine, masks, and gloves were used   Procedure Description Patient positioned in bed supine.  Sedation given as noted above.  A bougie was placed through existing ET tube.  Cuff was deflated.  ET tube was removed over the bougie.  A new 7.5 ETT tube was placed over the bougie via Seldinger technique.  The bougie was removed.  Return of volumes was noted on the ventilator.  Oxygen saturation improved.  Started having bloody secretions after placement of new ET tube.   Complications/Tolerance None; patient tolerated the procedure well. Chest X-ray is ordered to verify placement.   EBL Minimal   Specimen(s) None

## 2022-10-06 NOTE — Progress Notes (Signed)
eLink Physician-Brief Progress Note Patient Name: Yobani Schertzer DOB: Apr 08, 1961 MRN: [id number]   Date of Service  10/06/2022  HPI/Events of Note  Patient had a vigorous response to Lasix and the BP is now soft.  eICU Interventions  Levo gtt ordered to keep MAP > 65 mmHg.        Thomasene Lot Angelo Caroll 10/06/2022, 3:27 AM

## 2022-10-07 ENCOUNTER — Inpatient Hospital Stay (HOSPITAL_COMMUNITY): Payer: Medicare Other

## 2022-10-07 DIAGNOSIS — J9601 Acute respiratory failure with hypoxia: Secondary | ICD-10-CM | POA: Diagnosis not present

## 2022-10-07 LAB — BASIC METABOLIC PANEL
Anion gap: 5 (ref 5–15)
Anion gap: 7 (ref 5–15)
BUN: 40 mg/dL — ABNORMAL HIGH (ref 6–20)
BUN: 39 mg/dL — ABNORMAL HIGH (ref 6–20)
CO2: 29 mmol/L (ref 22–32)
CO2: 33 mmol/L — ABNORMAL HIGH (ref 22–32)
Calcium: 8.5 mg/dL — ABNORMAL LOW (ref 8.9–10.3)
Calcium: 8.8 mg/dL — ABNORMAL LOW (ref 8.9–10.3)
Chloride: 112 mmol/L — ABNORMAL HIGH (ref 98–111)
Chloride: 110 mmol/L (ref 98–111)
Creatinine, Ser: 1.11 mg/dL (ref 0.61–1.24)
Creatinine, Ser: 1.12 mg/dL (ref 0.61–1.24)
GFR, Estimated: >60 mL/min (ref >60)
GFR, Estimated: >60 mL/min (ref >60)
Glucose, Bld: 111 mg/dL — ABNORMAL HIGH (ref 70–99)
Glucose, Bld: 145 mg/dL — ABNORMAL HIGH (ref 70–99)
Potassium: 3.8 mmol/L (ref 3.5–5.1)
Potassium: 3.7 mmol/L (ref 3.5–5.1)
Sodium: 146 mmol/L — ABNORMAL HIGH (ref 135–145)
Sodium: 150 mmol/L — ABNORMAL HIGH (ref 135–145)

## 2022-10-07 LAB — BLOOD GAS, ARTERIAL
Acid-Base Excess: 6.4 mmol/L — ABNORMAL HIGH (ref 0.0–2.0)
Bicarbonate: 31.9 mmol/L — ABNORMAL HIGH (ref 20.0–28.0)
Drawn by: 270211
O2 Saturation: 96.2 %
PEEP: 10 cmH2O
Patient temperature: 37.8
RATE: 35 resp/min
pCO2 arterial: 50 mmHg — ABNORMAL HIGH (ref 32–48)
pH, Arterial: 7.42 (ref 7.35–7.45)
pO2, Arterial: 71 mmHg — ABNORMAL LOW (ref 83–108)

## 2022-10-07 LAB — GLUCOSE, CAPILLARY
Glucose-Capillary: 100 mg/dL — ABNORMAL HIGH (ref 70–99)
Glucose-Capillary: 115 mg/dL — ABNORMAL HIGH (ref 70–99)
Glucose-Capillary: 121 mg/dL — ABNORMAL HIGH (ref 70–99)
Glucose-Capillary: 127 mg/dL — ABNORMAL HIGH (ref 70–99)
Glucose-Capillary: 118 mg/dL — ABNORMAL HIGH (ref 70–99)
Glucose-Capillary: 110 mg/dL — ABNORMAL HIGH (ref 70–99)

## 2022-10-07 LAB — CBC
HCT: 28.3 % — ABNORMAL LOW (ref 39.0–52.0)
Hemoglobin: 9.3 g/dL — ABNORMAL LOW (ref 13.0–17.0)
MCH: 27.7 pg (ref 26.0–34.0)
MCHC: 32.9 g/dL (ref 30.0–36.0)
MCV: 84.2 fL (ref 80.0–100.0)
Platelets: 252 10*3/uL (ref 150–400)
RBC: 3.36 MIL/uL — ABNORMAL LOW (ref 4.22–5.81)
RDW: 17.1 % — ABNORMAL HIGH (ref 11.5–15.5)
WBC: 24 10*3/uL — ABNORMAL HIGH (ref 4.0–10.5)
nRBC: 0.1 % (ref 0.0–0.2)

## 2022-10-07 LAB — MAGNESIUM: Magnesium: 2.4 mg/dL (ref 1.7–2.4)

## 2022-10-07 LAB — TRIGLYCERIDES: Triglycerides: 164 mg/dL — ABNORMAL HIGH (ref <150)

## 2022-10-07 MED ORDER — OXYCODONE HCL 5 MG PO TABS: 10.0000 mg | ORAL_TABLET | Freq: Four times a day (QID) | ORAL | Status: DC

## 2022-10-07 MED ORDER — VECURONIUM BROMIDE 10 MG IV SOLR
10.0000 mg | Freq: Two times a day (BID) | INTRAVENOUS | Status: DC | PRN
  Administered 2022-10-07 – 2022-10-08 (×4): 10 mg via INTRAVENOUS
  Filled 2022-10-07 (×4): qty 10

## 2022-10-07 MED ORDER — POTASSIUM CHLORIDE 20 MEQ PO PACK
40.0000 meq | PACK | Freq: Once | ORAL | Status: AC
  Administered 2022-10-07: 40 meq
  Filled 2022-10-07: qty 2

## 2022-10-07 MED ORDER — CLONAZEPAM 1 MG PO TABS
1.0000 mg | ORAL_TABLET | Freq: Three times a day (TID) | ORAL | Status: DC
  Administered 2022-10-07 – 2022-10-19 (×39): 1 mg
  Filled 2022-10-07 (×39): qty 1

## 2022-10-07 MED ORDER — METOPROLOL TARTRATE 5 MG/5ML IV SOLN
5.0000 mg | Freq: Once | INTRAVENOUS | Status: AC
  Administered 2022-10-07: 5 mg via INTRAVENOUS
  Filled 2022-10-07: qty 5

## 2022-10-07 MED ORDER — STERILE WATER FOR INJECTION IJ SOLN
INTRAMUSCULAR | Status: AC
  Administered 2022-10-07: 10 mL
  Filled 2022-10-07: qty 10

## 2022-10-07 MED ORDER — FREE WATER
200.0000 mL | Status: DC
  Administered 2022-10-07 – 2022-10-08 (×7): 200 mL

## 2022-10-07 MED ORDER — AMLODIPINE BESYLATE 10 MG PO TABS
10.0000 mg | ORAL_TABLET | Freq: Every day | ORAL | Status: DC
  Administered 2022-10-07: 10 mg
  Filled 2022-10-07: qty 1

## 2022-10-07 MED ORDER — CLONAZEPAM 0.1 MG/ML ORAL SUSPENSION: 1.0000 mg | Freq: Three times a day (TID) | ORAL | Status: DC

## 2022-10-07 MED ORDER — OXYCODONE HCL 5 MG PO TABS
10.0000 mg | ORAL_TABLET | Freq: Four times a day (QID) | ORAL | Status: DC
  Administered 2022-10-07 – 2022-10-20 (×52): 10 mg
  Filled 2022-10-07 (×52): qty 2

## 2022-10-07 MED ORDER — FUROSEMIDE 10 MG/ML IJ SOLN
40.0000 mg | Freq: Four times a day (QID) | INTRAMUSCULAR | Status: DC
  Administered 2022-10-07 (×2): 40 mg via INTRAVENOUS
  Filled 2022-10-07 (×2): qty 4

## 2022-10-07 NOTE — Progress Notes (Signed)
Pt had a cuff leak. Et tube was exchanged without complication by ccm. Placement confirmed with end tidal co2 detector. O2 saturations 97% post change with no leak present.

## 2022-10-07 NOTE — Progress Notes (Addendum)
NAME:  Damon [name], MRN:  [id number], DOB:  02/02/1961, LOS: 4 ADMISSION DATE:  10/03/2022, CONSULTATION DATE:  10/03/22 REFERRING MD: Dr. Ronaldo Miyamoto CHIEF COMPLAINT:  Resp Failure   History of Present Illness:  61 y/o M who has a PMH as below. He presented to [facility] urgent care 12/18 with confusion, worsening dyspnea after "recent respiratory infection", cough, congestion, fever, chills. On day of presentation, he began to stumble and almost fall and also had an episode of incontinence which is apparently not normal for him. He was hypoxic to 88% on RA and was placed on 5L O2 via [place] before being sent to [facility] ED for further evaluation and management.  He was seen by EDP early AM 12/19 and his flu swab was noted to be positive for flu A. He was discharged home from ED.  He then returned early AM 12/20 with persistent cough, N/V, worsening dyspnea. He was found to have O2 sat of 85% and was placed on BiPAP for increased WOB. CXR showed bilateral infiltrates R > L.  He was admitted by [facility] and was continued on BiPAP with CAP coverage initiation. A few hours after admission, he became agitated, pulling at lines and his BiPAP mask to the point that he continually ripped it off. He was then noted to have bradycardia and became unresponsive. A code blue was called. He was found in PEA and had roughly 5 minutes of ACLS before ROSC. During intubation, he had a 2nd brief arrest, presumed respiratory after an aspiration event.  Post code, he was minimally responsive (no sedation for intubation). Repeat labs were sent.   PCCM ultimately called for admission.  Pertinent  Medical History:  has Acute respiratory failure with hypoxia (HCC); CAP (community acquired pneumonia); Influenza and pneumonia; Elevated LFTs; HTN (hypertension); HLD (hyperlipidemia); Tobacco abuse; SIRS (systemic inflammatory response syndrome) (HCC); Influenza A; Cardiac arrest due to respiratory disorder (HCC); and Tobacco dependence on their  problem list.  Significant Hospital Events: Including procedures, antibiotic start and stop dates in addition to other pertinent events   12/18 flu A positive 12/20 admitted, 5 minute code after non-compliance with BiPAP 12/21 Intubated, paralyzed, ARDS protocol, on 7cc/kg 12/22 weaned to 6 cc/kg with dyssynchrony, back on 7 cc/kg, driving pressures food, weaning vent 12/23 attempt to wean sedation again with dyssynchrony and desaturation.  Some concern for cuff leak, tube exchange.  Cuff did not appear to be blown.  Ongoing signs of cuff leak, query leaking around cough, possible large trachea, diuresing  Interim History / Subjective:  WBC plateaued.  FiO2 50%, PEEP 10, stable over last couple days.  Plateaued.  Driving pressures remain low. Sedation not changed but becoming mor dyssynchronous.  Objective:  Blood pressure (!) 169/73, pulse (!) 121, temperature 99.3 F (37.4 C), resp. rate (!) 28, height 5\' 4"  (1.626 m), weight 69.3 kg, SpO2 98 %.    Vent Mode: PRVC FiO2 (%):  [40 %-100 %] 50 % Set Rate:  [35 bmp] 35 bmp Vt Set:  [410 mL] 410 mL PEEP:  [10 cmH20] 10 cmH20 Plateau Pressure:  [15 cmH20-23 cmH20] 23 cmH20   Intake/Output Summary (Last 24 hours) at 10/07/2022 0820 Last data filed at 10/07/2022 0521 Gross per 24 hour  Intake 2169.73 ml  Output 3000 ml  Net -830.27 ml    Filed Weights   10/03/22 0359 10/06/22 0345 10/07/22 0500  Weight: 64.9 kg 69.5 kg 69.3 kg    Examination: General: critically ill appearing adult male lying in bed  in NAD HEENT: MM pink/moist, ETT, anicteric, mild scleral edema & trace hemorrhage Neuro: deeply sedated CV: s1s2 RRR, ST, no m/r/g PULM: non-labored at rest, bronchial breath sounds GI: soft, bsx4 active  Extremities: warm/dry, no edema  Skin: no rashes or lesions   BC with 2 species staph >> 1 of 4 bottles Resp Culture >> OP flora UC >> neg  Resolved Hospital Issues  Septic Shock secondary to Influenza and Strep Pneumoniae  PNA, AKI   Assessment & Plan:   Severe ARDS due to flu and post flu bacterial pneumonia: Strep urinary antigen positive. -- PF ratio improving -- PRVC, now on 7 cc/kg, ventilation and oxygenation markedly improved overall but plateauing -- Paralytic stopped 12/22 -- Goal PaO2 55-80, wean FiO2 of over 80 or SpO2 > 95 -- Current PEEP of 10 with a driving pressure of <10, current compliance is reasonable, wean PEEP as able -- Continue ceftriaxone setting of positive strep urinary antigen, plan 7 days -- Tamiflu x 5 days -- More aggressive diuresis, IV lasix every 6 hrs x 3, repeat BMP in afternoon   AKI (resolved): Suspect prerenal in the setting of poor p.o. intake while ill, ATN in the setting of developing shock. -- Cr improved s/p fluid administration, now diuresing   Septic shock (resolved): Mild hypotension with some contribution of sedatives. Pneumonia source. -- MAP over the 65, norepinephrine on and off   Toxic metabolic encephalopathy: Postarrest he had really no meaningful interaction.  Brainstem reflexes intact.  Now with severe ARDS requires deep sedation. -- RASS goal -3, midazolam drip given hypotension, fentanyl drip, as needed fentanyl and midazolam, wean as able --add oral oxy 10 mg q6 and clonazepam 1 mg TID 12/24 -- Consider CT head once clinically stable if persistent   Severe Protein calorie malnutrition: --start TF 12/22, advance as tolerated   PEA arrest: Likely respiratory event related to above.   Multiple staph species on blood culture: In 1 of 4 bottles. Favored to represent contaminant.   HTN: Bps labile -- Amlodipine resumed, tends to get hypertensive in the afternoons  Hypernatremia: Setting of salt load the IV fluids, diuresis. -- Free water added 12/24  Best practice (evaluated daily):  Diet/type: tubefeeds DVT prophylaxis: LMWH GI prophylaxis: PPI Lines: Central line Foley:  Yes, and it is still needed Code Status:  full code Last date of  multidisciplinary goals of care discussion: family updated at bedside  Critical care time:    CRITICAL CARE Performed by: Karren Burly   Total critical care time: 40 minutes  Critical care time was exclusive of separately billable procedures and treating other patients.  Critical care was necessary to treat or prevent imminent or life-threatening deterioration.  Critical care was time spent personally by me on the following activities: development of treatment plan with patient and/or surrogate as well as nursing, discussions with consultants, evaluation of patient's response to treatment, examination of patient, obtaining history from patient or surrogate, ordering and performing treatments and interventions, ordering and review of laboratory studies, ordering and review of radiographic studies, pulse oximetry and re-evaluation of patient's condition.   Karren Burly, MD [place] [facility] 10/07/2022, 8:20 AM   Please see [email or web address] for pager details.  From 7A-7P if no response, please call [phone] After hours, please call ELink [phone]

## 2022-10-07 NOTE — Progress Notes (Signed)
eLink Physician-Brief Progress Note Patient Name: Damon [name] DOB: [birth year] MRN: [id number]   Date of Service  10/07/2022  HPI/Events of Note  Bedside gave prn total 40 mg Hydralazine for SBP>170, and the BP is actually getting worse and going up. it is in the 200's systolic now  Camera : HR 120's. SBP > 200. ARDS. 96%. In synchrony with vent. PIP 23. Earlier had ET tube exchange with bougie for air leak.  On 300 mcg of fentanyl, versed also .   eICU Interventions  Lopressor IV ordered , call back if not better.      Intervention Category Intermediate Interventions: Hypertension - evaluation and management  Ranee Gosselin 10/07/2022, 11:38 PM  00:27 SBP still high. Start Cardene drip.

## 2022-10-08 ENCOUNTER — Inpatient Hospital Stay (HOSPITAL_COMMUNITY): Admit: 2022-10-08 | Discharge: 2022-10-08 | Disposition: A | Discharge status: Home / Self Care | Payer: Medicare Other

## 2022-10-08 DIAGNOSIS — R569 Unspecified convulsions: Secondary | ICD-10-CM

## 2022-10-08 DIAGNOSIS — J9601 Acute respiratory failure with hypoxia: Secondary | ICD-10-CM | POA: Diagnosis not present

## 2022-10-08 LAB — BLOOD GAS, ARTERIAL
Acid-Base Excess: 12.1 mmol/L — ABNORMAL HIGH (ref 0.0–2.0)
Bicarbonate: 37.8 mmol/L — ABNORMAL HIGH (ref 20.0–28.0)
O2 Saturation: 95.5 %
Patient temperature: 37
pCO2 arterial: 52 mmHg — ABNORMAL HIGH (ref 32–48)
pH, Arterial: 7.47 — ABNORMAL HIGH (ref 7.35–7.45)
pO2, Arterial: 64 mmHg — ABNORMAL LOW (ref 83–108)

## 2022-10-08 LAB — GLUCOSE, CAPILLARY
Glucose-Capillary: 104 mg/dL — ABNORMAL HIGH (ref 70–99)
Glucose-Capillary: 107 mg/dL — ABNORMAL HIGH (ref 70–99)
Glucose-Capillary: 115 mg/dL — ABNORMAL HIGH (ref 70–99)
Glucose-Capillary: 138 mg/dL — ABNORMAL HIGH (ref 70–99)
Glucose-Capillary: 145 mg/dL — ABNORMAL HIGH (ref 70–99)
Glucose-Capillary: 98 mg/dL (ref 70–99)

## 2022-10-08 LAB — BASIC METABOLIC PANEL
Anion gap: 9 (ref 5–15)
Anion gap: 8 (ref 5–15)
BUN: 37 mg/dL — ABNORMAL HIGH (ref 6–20)
BUN: 41 mg/dL — ABNORMAL HIGH (ref 6–20)
CO2: 30 mmol/L (ref 22–32)
CO2: 33 mmol/L — ABNORMAL HIGH (ref 22–32)
Calcium: 8.4 mg/dL — ABNORMAL LOW (ref 8.9–10.3)
Calcium: 8.2 mg/dL — ABNORMAL LOW (ref 8.9–10.3)
Chloride: 110 mmol/L (ref 98–111)
Chloride: 105 mmol/L (ref 98–111)
Creatinine, Ser: 0.83 mg/dL (ref 0.61–1.24)
Creatinine, Ser: 0.88 mg/dL (ref 0.61–1.24)
GFR, Estimated: >60 mL/min (ref >60)
GFR, Estimated: >60 mL/min (ref >60)
Glucose, Bld: 155 mg/dL — ABNORMAL HIGH (ref 70–99)
Glucose, Bld: 110 mg/dL — ABNORMAL HIGH (ref 70–99)
Potassium: 3.5 mmol/L (ref 3.5–5.1)
Potassium: 3.5 mmol/L (ref 3.5–5.1)
Sodium: 149 mmol/L — ABNORMAL HIGH (ref 135–145)
Sodium: 146 mmol/L — ABNORMAL HIGH (ref 135–145)

## 2022-10-08 LAB — CBC
HCT: 31.1 % — ABNORMAL LOW (ref 39.0–52.0)
Hemoglobin: 9.8 g/dL — ABNORMAL LOW (ref 13.0–17.0)
MCH: 26.8 pg (ref 26.0–34.0)
MCHC: 31.5 g/dL (ref 30.0–36.0)
MCV: 85 fL (ref 80.0–100.0)
Platelets: 307 10*3/uL (ref 150–400)
RBC: 3.66 MIL/uL — ABNORMAL LOW (ref 4.22–5.81)
RDW: 17.1 % — ABNORMAL HIGH (ref 11.5–15.5)
WBC: 22.4 10*3/uL — ABNORMAL HIGH (ref 4.0–10.5)
nRBC: 0.1 % (ref 0.0–0.2)

## 2022-10-08 LAB — CULTURE, BLOOD (ROUTINE X 2)
Culture: NO GROWTH
Special Requests: ADEQUATE

## 2022-10-08 MED ORDER — LEVETIRACETAM IN NACL 500 MG/100ML IV SOLN
500.0000 mg | Freq: Two times a day (BID) | INTRAVENOUS | Status: DC
  Administered 2022-10-09: 500 mg via INTRAVENOUS
  Filled 2022-10-08 (×2): qty 100

## 2022-10-08 MED ORDER — POTASSIUM CHLORIDE 20 MEQ PO PACK
40.0000 meq | PACK | Freq: Once | ORAL | Status: AC
  Administered 2022-10-08: 40 meq
  Filled 2022-10-08: qty 2

## 2022-10-08 MED ORDER — NICARDIPINE HCL IN NACL 20-0.86 MG/200ML-% IV SOLN
3.0000 mg/h | INTRAVENOUS | Status: DC
  Administered 2022-10-08 (×2): 5 mg/h via INTRAVENOUS
  Filled 2022-10-08 (×3): qty 200

## 2022-10-08 MED ORDER — PROPOFOL 1000 MG/100ML IV EMUL
5.0000 ug/kg/min | INTRAVENOUS | Status: DC
  Administered 2022-10-08: 5 ug/kg/min via INTRAVENOUS
  Administered 2022-10-09 (×2): 60 ug/kg/min via INTRAVENOUS
  Administered 2022-10-09: 40 ug/kg/min via INTRAVENOUS
  Administered 2022-10-10: 80 ug/kg/min via INTRAVENOUS
  Administered 2022-10-10: 60 ug/kg/min via INTRAVENOUS
  Administered 2022-10-10: 80 ug/kg/min via INTRAVENOUS
  Filled 2022-10-08 (×9): qty 100

## 2022-10-08 MED ORDER — CARVEDILOL 12.5 MG PO TABS
12.5000 mg | ORAL_TABLET | Freq: Two times a day (BID) | ORAL | Status: DC
  Administered 2022-10-08 – 2022-10-21 (×28): 12.5 mg
  Filled 2022-10-08 (×28): qty 1

## 2022-10-08 MED ORDER — FREE WATER
100.0000 mL | Status: DC
  Administered 2022-10-08 – 2022-10-09 (×33): 100 mL

## 2022-10-08 MED ORDER — MIDAZOLAM HCL 2 MG/2ML IJ SOLN
2.0000 mg | Freq: Once | INTRAMUSCULAR | Status: AC
  Administered 2022-10-09: 2 mg via INTRAVENOUS
  Filled 2022-10-08: qty 2

## 2022-10-08 MED ORDER — STERILE WATER FOR INJECTION IJ SOLN
INTRAMUSCULAR | Status: AC
  Administered 2022-10-08: 10 mL
  Filled 2022-10-08: qty 10

## 2022-10-08 MED ORDER — FUROSEMIDE 10 MG/ML IJ SOLN
40.0000 mg | Freq: Four times a day (QID) | INTRAMUSCULAR | Status: AC
  Administered 2022-10-08 (×2): 40 mg via INTRAVENOUS
  Filled 2022-10-08 (×2): qty 4

## 2022-10-08 MED ORDER — LEVETIRACETAM IN NACL 1500 MG/100ML IV SOLN
1500.0000 mg | Freq: Once | INTRAVENOUS | Status: AC
  Administered 2022-10-08: 1500 mg via INTRAVENOUS
  Filled 2022-10-08: qty 100

## 2022-10-08 MED ORDER — CARVEDILOL 12.5 MG PO TABS: 12.5000 mg | ORAL_TABLET | Freq: Two times a day (BID) | ORAL | Status: DC

## 2022-10-08 NOTE — Progress Notes (Signed)
Patient having dysnchrony with ventilator requiring multiple boluses of fentanyl and versed, as well as two doses of Vecuronium in order to accomplish ventilator synchrony. Heart rate remains elevated 110-120's.   Blood pressure improving with nicardipine drip at 5mg /hr.

## 2022-10-08 NOTE — Progress Notes (Addendum)
Ceribell started at 850 AM. Neurology and CCM notified. Verbal to stop EEG after 30 minutes. EEG stopped at 9:21 AM. Neurology and CCM aware.   1:34 PM- Patient had increased tremor that is now also noted in left hand and arm. RN steadily decreasing sedation medications. Hunsucker MD aware. At bedside.

## 2022-10-08 NOTE — Progress Notes (Addendum)
eLink Physician-Brief Progress Note Patient Name: Damon [name] DOB: [birth year] MRN: [id number]   Date of Service  10/08/2022  HPI/Events of Note  Asked to follow 6 pm BMP. Results not in system at this time  eICU Interventions  Please let us know when labs result .      Intervention Category Major Interventions: Electrolyte abnormality - evaluation and management  [name] [name] 10/08/2022, 7:28 PM  Addendum at 8:10 pm: BMP resulted. 40 meq oral K and AM labs ordered , including mag  Addendum at 1130 pm - Twitching and tremors of arms. Likely related to anoxic brain damage. EEG with no seizures. Versed x 1 ordered  Addendum at 3:30 am RN notified me that patient has unchanged twitching and jerking of arms. Not changed from day shift. EEG did not show seizures. Versed helped for around 30 min when he was given it. Will add PRN versed.

## 2022-10-08 NOTE — Procedures (Addendum)
Patient Name: Damon [name]  MRN: [id number]  Epilepsy Attending: Charlsie Quest  Referring Physician/Provider: Milon Dikes, MD  Date: 10/08/2022 Duration: 30.43 mins  Patient history: 61yo M with twitching s/p cardiac arrest. EEG to evaluate for seizure  Level of alertness: comatose  AEDs during EEG study: None  Technical aspects: This EEG was obtained using a 10 lead EEG system positioned circumferentially without any parasagittal coverage (rapid EEG). Computer selected EEG is reviewed as  well as background features and all clinically significant events.  Description: EEG showed generalized periodic discharges (GPDS) with fluctuating frequency of 13Hz  In between GPDS,  EEG showed generalized background suppression. Hyperventilation and photic stimulation were not performed.     ABNORMALITY - Periodic discharges, generalized ( GPDs) - Background suppression, generalized  IMPRESSION: This study showed evidence of epileptogenicity with generalized onset. The GPDs at times reach 3hz  in frequency which makes this concerning for ictal nature. Video is not available. Therefore, please correlate clinically for seizures.  Additionally there was evidence of profound diffuse encephalopathy.  In the setting of cardiac arrest, this EEG pattern is concerning for anoxic/hypoxic brain injury.  [name] 

## 2022-10-08 NOTE — Progress Notes (Signed)
Brief PCCM progress note:  EEG performed with right wrist twitching.  Per report, this shows signs of anoxic brain injury.  He subsequently developed bilateral upper extremity twitching or myoclonus more likely.  Discussed with wife at bedside.  Patient likely has severe anoxic brain injury and unlikely to recover neurologically.  [name] is on continuous sedatives.  Discussed we will decrease continuous sedatives as able.  Fentanyl and midazolam discontinued.  He is on clonazepam and oral oxycodone.  Given severity of myoclonus, will start propofol to treat the motor activity was quite distressing to observe.  The advance of profiles we can turn off quickly to get an exam etc.  Again, share with the wife I think that he will not recover neurologically.  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  Never build to cut grass.  Never walk or feed himself.  I could be wrong, stopping continuous sedatives but based on EEG and myoclonus on exam now 5 days out from cardiac arrest, severe anoxic injury is suspected.  MRI brain ordered for further neuro prognostication.

## 2022-10-08 NOTE — Progress Notes (Signed)
NAME:  Damon [name], MRN:  [id number], DOB:  Jun 30, 1961, LOS: 5 ADMISSION DATE:  10/03/2022, CONSULTATION DATE:  10/03/22 REFERRING MD: Dr. Ronaldo Miyamoto CHIEF COMPLAINT:  Resp Failure   History of Present Illness:  61 y/o M who has a PMH as below. He presented to [facility] urgent care 12/18 with confusion, worsening dyspnea after "recent respiratory infection", cough, congestion, fever, chills. On day of presentation, he began to stumble and almost fall and also had an episode of incontinence which is apparently not normal for him. He was hypoxic to 88% on RA and was placed on 5L O2 via [place] before being sent to [facility] ED for further evaluation and management.  He was seen by EDP early AM 12/19 and his flu swab was noted to be positive for flu A. He was discharged home from ED.  He then returned early AM 12/20 with persistent cough, N/V, worsening dyspnea. He was found to have O2 sat of 85% and was placed on BiPAP for increased WOB. CXR showed bilateral infiltrates R > L.  He was admitted by [facility] and was continued on BiPAP with CAP coverage initiation. A few hours after admission, he became agitated, pulling at lines and his BiPAP mask to the point that he continually ripped it off. He was then noted to have bradycardia and became unresponsive. A code blue was called. He was found in PEA and had roughly 5 minutes of ACLS before ROSC. During intubation, he had a 2nd brief arrest, presumed respiratory after an aspiration event.  Post code, he was minimally responsive (no sedation for intubation). Repeat labs were sent.   PCCM ultimately called for admission.  Pertinent  Medical History:  has Acute respiratory failure with hypoxia (HCC); CAP (community acquired pneumonia); Influenza and pneumonia; Elevated LFTs; HTN (hypertension); HLD (hyperlipidemia); Tobacco abuse; SIRS (systemic inflammatory response syndrome) (HCC); Influenza A; Cardiac arrest due to respiratory disorder (HCC); and Tobacco dependence on their  problem list.  Significant Hospital Events: Including procedures, antibiotic start and stop dates in addition to other pertinent events   12/18 flu A positive 12/20 admitted, 5 minute code after non-compliance with BiPAP 12/21 Intubated, paralyzed, ARDS protocol, on 7cc/kg 12/22 weaned to 6 cc/kg with dyssynchrony, back on 7 cc/kg, driving pressures food, weaning vent 12/23 attempt to wean sedation again with dyssynchrony and desaturation.  Some concern for cuff leak, tube exchange.  Cuff did not appear to be blown.  Ongoing signs of cuff leak, query leaking around cough, possible large trachea, diuresing  Interim History / Subjective:  WBC trending down. Tolerating PSV this morning, PEEP to 8, FiO2 50%, SpO2 improved on PSV, likely higher mean airway pressure with higher Vt. R wrist twitching, with change of position, check ceribell. If no seizure plan rapid wean sedation.   Objective:  Blood pressure 125/65, pulse (!) 115, temperature 99.1 F (37.3 C), resp. rate (!) 35, height 5\' 4"  (1.626 m), weight 69.3 kg, SpO2 96 %.    Vent Mode: PSV;CPAP FiO2 (%):  [50 %-60 %] 50 % Set Rate:  [35 bmp] 35 bmp Vt Set:  [410 mL] 410 mL PEEP:  [8 cmH20-10 cmH20] 8 cmH20 Pressure Support:  [10 cmH20] 10 cmH20 Plateau Pressure:  [21 cmH20-23 cmH20] 21 cmH20   Intake/Output Summary (Last 24 hours) at 10/08/2022 0843 Last data filed at 10/08/2022 0745 Gross per 24 hour  Intake 3420.09 ml  Output 5910 ml  Net -2489.91 ml    Filed Weights   10/03/22 0359 10/06/22 0345 10/07/22  0500  Weight: 64.9 kg 69.5 kg 69.3 kg    Examination: General: critically ill appearing adult male lying in bed in NAD HEENT: MM pink/moist, ETT, anicteric, mild scleral edema & trace hemorrhage Neuro: deeply sedated CV: s1s2 RRR, ST, no m/r/g PULM: non-labored at rest, bronchial breath sounds GI: soft, bsx4 active  Extremities: warm/dry, no edema  Skin: no rashes or lesions   BC with 2 species staph >> 1 of 4  bottles Resp Culture >> OP flora UC >> neg  Resolved Hospital Issues  Septic Shock secondary to Influenza and Strep Pneumoniae PNA, AKI   Assessment & Plan:   Severe ARDS due to flu and post flu bacterial pneumonia: Strep urinary antigen positive. Driving pressures have remained 11-13. Was on 7 cc/kg due to  -- PRVC, PSV as tolerated -- Paralytic stopped 12/22 -- Goal PaO2 55-80, wean FiO2 of over 80 or SpO2 > 95 -- Continue ceftriaxone setting of positive strep urinary antigen, plan 7 days -- Tamiflu x 5 days -- More aggressive diuresis, IV lasix every 6 hrs x 2, repeat BMP in afternoon   AKI (resolved): Suspect prerenal in the setting of poor p.o. intake while ill, ATN in the setting of developing shock. -- Cr improved s/p fluid administration with further improvement during diuresis   Septic shock (resolved): Mild hypotension with some contribution of sedatives. Pneumonia source.   Toxic metabolic encephalopathy: Postarrest he had really no meaningful interaction.  Brainstem reflexes intact.  Now with severe ARDS requires deep sedation. -- RASS goal -1, midazolam drip given hypotension, fentanyl drip, as needed fentanyl and midazolam, plan rapid wean, likely has accumulated --oral oxy 10 mg q6 and clonazepam 1 mg TID 12/24, aid in weanign and help prevent withdrawal symptoms -- Consider CT head once clinically stable if persistent   Severe Protein calorie malnutrition: --started TF 12/22   PEA arrest: Likely respiratory event related to above.   Multiple staph species on blood culture: In 1 of 4 bottles. Favored to represent contaminant.   HTN: Bps labile -- Amlodipine resumed, tends to get hypertensive in the afternoons  Hypernatremia: Setting of salt load the IV fluids, diuresis. -- Free water added 12/24, increased 12/25  Right wrist twitch: --cerebell to eval seizures, neurology notified  Best practice (evaluated daily):  Diet/type: tubefeeds DVT prophylaxis:  LMWH GI prophylaxis: PPI Lines: Central line Foley:  Yes, and it is still needed Code Status:  full code Last date of multidisciplinary goals of care discussion: family updated at bedside 12/24  Critical care time:    CRITICAL CARE Performed by: Lesia Sago Denni France   Total critical care time: 45 minutes  Critical care time was exclusive of separately billable procedures and treating other patients.  Critical care was necessary to treat or prevent imminent or life-threatening deterioration.  Critical care was time spent personally by me on the following activities: development of treatment plan with patient and/or surrogate as well as nursing, discussions with consultants, evaluation of patient's response to treatment, examination of patient, obtaining history from patient or surrogate, ordering and performing treatments and interventions, ordering and review of laboratory studies, ordering and review of radiographic studies, pulse oximetry and re-evaluation of patient's condition.   Karren Burly, MD [facility] 10/08/2022, 8:43 AM   Please see [email or web address] for pager details.  From 7A-7P if no response, please call [phone] After hours, please call ELink [phone]

## 2022-10-09 ENCOUNTER — Inpatient Hospital Stay (HOSPITAL_COMMUNITY): Payer: Medicare Other

## 2022-10-09 DIAGNOSIS — J9601 Acute respiratory failure with hypoxia: Secondary | ICD-10-CM | POA: Diagnosis not present

## 2022-10-09 LAB — BASIC METABOLIC PANEL
Anion gap: 6 (ref 5–15)
BUN: 45 mg/dL — ABNORMAL HIGH (ref 6–20)
CO2: 32 mmol/L (ref 22–32)
Calcium: 8.4 mg/dL — ABNORMAL LOW (ref 8.9–10.3)
Chloride: 108 mmol/L (ref 98–111)
Creatinine, Ser: 0.97 mg/dL (ref 0.61–1.24)
GFR, Estimated: >60 mL/min (ref >60)
Glucose, Bld: 99 mg/dL (ref 70–99)
Potassium: 4 mmol/L (ref 3.5–5.1)
Sodium: 146 mmol/L — ABNORMAL HIGH (ref 135–145)

## 2022-10-09 LAB — CBC WITH DIFFERENTIAL/PLATELET
Abs Immature Granulocytes: 3.21 10*3/uL — ABNORMAL HIGH (ref 0.00–0.07)
Basophils Absolute: 0.1 10*3/uL (ref 0.0–0.1)
Basophils Relative: 0 %
Eosinophils Absolute: 0.3 10*3/uL (ref 0.0–0.5)
Eosinophils Relative: 1 %
HCT: 27.8 % — ABNORMAL LOW (ref 39.0–52.0)
Hemoglobin: 9.2 g/dL — ABNORMAL LOW (ref 13.0–17.0)
Immature Granulocytes: 16 %
Lymphocytes Relative: 8 %
Lymphs Abs: 1.7 10*3/uL (ref 0.7–4.0)
MCH: 27.6 pg (ref 26.0–34.0)
MCHC: 33.1 g/dL (ref 30.0–36.0)
MCV: 83.5 fL (ref 80.0–100.0)
Monocytes Absolute: 1.7 10*3/uL — ABNORMAL HIGH (ref 0.1–1.0)
Monocytes Relative: 8 %
Neutro Abs: 13.4 10*3/uL — ABNORMAL HIGH (ref 1.7–7.7)
Neutrophils Relative %: 67 %
Platelets: 310 10*3/uL (ref 150–400)
RBC: 3.33 MIL/uL — ABNORMAL LOW (ref 4.22–5.81)
RDW: 16.7 % — ABNORMAL HIGH (ref 11.5–15.5)
WBC: 20.3 10*3/uL — ABNORMAL HIGH (ref 4.0–10.5)
nRBC: 0 % (ref 0.0–0.2)

## 2022-10-09 LAB — GLUCOSE, CAPILLARY
Glucose-Capillary: 97 mg/dL (ref 70–99)
Glucose-Capillary: 100 mg/dL — ABNORMAL HIGH (ref 70–99)
Glucose-Capillary: 108 mg/dL — ABNORMAL HIGH (ref 70–99)
Glucose-Capillary: 99 mg/dL (ref 70–99)
Glucose-Capillary: 92 mg/dL (ref 70–99)
Glucose-Capillary: 104 mg/dL — ABNORMAL HIGH (ref 70–99)

## 2022-10-09 LAB — MAGNESIUM: Magnesium: 1.9 mg/dL (ref 1.7–2.4)

## 2022-10-09 MED ORDER — MIDAZOLAM HCL 2 MG/2ML IJ SOLN
2.0000 mg | INTRAMUSCULAR | Status: DC | PRN
  Administered 2022-10-09 (×5): 2 mg via INTRAVENOUS
  Filled 2022-10-09 (×6): qty 2

## 2022-10-09 MED ORDER — LEVETIRACETAM IN NACL 1500 MG/100ML IV SOLN
1500.0000 mg | Freq: Two times a day (BID) | INTRAVENOUS | Status: DC
  Administered 2022-10-09 – 2022-10-13 (×8): 1500 mg via INTRAVENOUS
  Filled 2022-10-09 (×8): qty 100

## 2022-10-09 MED ORDER — FREE WATER
100.0000 mL | Status: DC
  Administered 2022-10-10 – 2022-10-13 (×22): 100 mL

## 2022-10-09 MED ORDER — MAGNESIUM SULFATE 2 GM/50ML IV SOLN
2.0000 g | Freq: Once | INTRAVENOUS | Status: AC
  Administered 2022-10-09: 2 g via INTRAVENOUS
  Filled 2022-10-09: qty 50

## 2022-10-09 MED ORDER — MIDAZOLAM HCL 2 MG/2ML IJ SOLN
2.0000 mg | INTRAMUSCULAR | Status: DC | PRN
  Administered 2022-10-09: 2 mg via INTRAVENOUS
  Administered 2022-10-10: 4 mg via INTRAVENOUS
  Administered 2022-10-25 (×4): 2 mg via INTRAVENOUS
  Administered 2022-10-26 – 2022-10-27 (×6): 4 mg via INTRAVENOUS
  Administered 2022-10-27 – 2022-10-28 (×3): 2 mg via INTRAVENOUS
  Administered 2022-10-28: 4 mg via INTRAVENOUS
  Administered 2022-10-29 – 2022-11-01 (×4): 2 mg via INTRAVENOUS
  Filled 2022-10-09: qty 2
  Filled 2022-10-09: qty 4
  Filled 2022-10-09 (×4): qty 2
  Filled 2022-10-09 (×2): qty 4
  Filled 2022-10-09 (×2): qty 2
  Filled 2022-10-09: qty 4
  Filled 2022-10-09: qty 2
  Filled 2022-10-09 (×2): qty 4
  Filled 2022-10-09 (×2): qty 2
  Filled 2022-10-09: qty 4
  Filled 2022-10-09 (×2): qty 2
  Filled 2022-10-09: qty 4
  Filled 2022-10-09: qty 2

## 2022-10-09 NOTE — Progress Notes (Signed)
PCCM:  late note entry.   I met with patients wife at bedside. Alroy Dust patients RN was also present for conversation. She is in agreement for changing of code status to DNR.   Orders placed  BLI

## 2022-10-09 NOTE — Progress Notes (Signed)
Patient continues to have tremors in his upper extremities during this shift. One time order of Midazolam 2mg  ordered with minimal effectiveness noted. With oral care or any stimulation, patient's episodes are triggered and intermittently worsen.

## 2022-10-09 NOTE — Progress Notes (Signed)
NAME:  Damon [name], MRN:  [id number], DOB:  Sep 19, 1961, LOS: 6 ADMISSION DATE:  10/03/2022, CONSULTATION DATE:  10/03/22 REFERRING MD: Dr. Ronaldo Miyamoto CHIEF COMPLAINT:  Resp Failure   History of Present Illness:  61 y/o M who has a PMH as below. He presented to [facility] urgent care 12/18 with confusion, worsening dyspnea after "recent respiratory infection", cough, congestion, fever, chills. On day of presentation, he began to stumble and almost fall and also had an episode of incontinence which is apparently not normal for him. He was hypoxic to 88% on RA and was placed on 5L O2 via [place] before being sent to [facility] ED for further evaluation and management.  He was seen by EDP early AM 12/19 and his flu swab was noted to be positive for flu A. He was discharged home from ED.  He then returned early AM 12/20 with persistent cough, N/V, worsening dyspnea. He was found to have O2 sat of 85% and was placed on BiPAP for increased WOB. CXR showed bilateral infiltrates R > L.  He was admitted by [facility] and was continued on BiPAP with CAP coverage initiation. A few hours after admission, he became agitated, pulling at lines and his BiPAP mask to the point that he continually ripped it off. He was then noted to have bradycardia and became unresponsive. A code blue was called. He was found in PEA and had roughly 5 minutes of ACLS before ROSC. During intubation, he had a 2nd brief arrest, presumed respiratory after an aspiration event.  Post code, he was minimally responsive (no sedation for intubation). Repeat labs were sent.   PCCM ultimately called for admission.  Pertinent  Medical History:  has Acute respiratory failure with hypoxia (HCC); CAP (community acquired pneumonia); Influenza and pneumonia; Elevated LFTs; HTN (hypertension); HLD (hyperlipidemia); Tobacco abuse; SIRS (systemic inflammatory response syndrome) (HCC); Influenza A; Cardiac arrest due to respiratory disorder (HCC); and Tobacco dependence on their  problem list.  Significant Hospital Events: Including procedures, antibiotic start and stop dates in addition to other pertinent events   12/18 flu A positive 12/20 admitted, 5 minute code after non-compliance with BiPAP 12/21 Intubated, paralyzed, ARDS protocol, on 7cc/kg 12/22 weaned to 6 cc/kg with dyssynchrony, back on 7 cc/kg, driving pressures food, weaning vent 12/23 attempt to wean sedation again with dyssynchrony and desaturation.  Some concern for cuff leak, tube exchange.  Cuff did not appear to be blown.  Ongoing signs of cuff leak, query leaking around cough, possible large trachea, diuresing 12/25 tolerating PSV, myoclonus> ceribell, Neuro consult, changed to propofol, diuresed   Interim History / Subjective:  Tmax 100.2 Remains on PSV (better pressures, 10/8, 60%) Propofol @60  Versed x 2 given overnight for twitching, pending MRI for today  Objective:  Blood pressure 137/63, pulse 99, temperature 100 F (37.8 C), resp. rate (!) 23, height 5\' 4"  (1.626 m), weight 69.3 kg, SpO2 96 %.    Vent Mode: CPAP;PSV FiO2 (%):  [50 %-60 %] 60 % PEEP:  [8 cmH20] 8 cmH20 Pressure Support:  [10 cmH20] 10 cmH20 Plateau Pressure:  [20 cmH20-24 cmH20] 22 cmH20   Intake/Output Summary (Last 24 hours) at 10/09/2022 0711 Last data filed at 10/09/2022 10/11/2022 Gross per 24 hour  Intake 2317.27 ml  Output 4760 ml  Net -2442.73 ml   Filed Weights   10/03/22 0359 10/06/22 0345 10/07/22 0500  Weight: 64.9 kg 69.5 kg 69.3 kg    Examination: General:  critically ill adult male lying in bed in NAD  HEENT: MM pink/moist, pupils pinpoint, arcus senilis, intact corneals, ETT/ OGT Neuro: onset of tremors mainly in Right bicep and left forearm/ hand with noxious stimuli, otherwise unresponsive CV: rr, NSR, no murmur, L IJ CVL, R radial Aline PULM:  non labored on PSV, lungs slightly coarse on RLL, clear after suctioning, scant clear secretions, delayed/ minimal cough, plat 22, DP 14 GI: soft,  bs+, ND, foley- cyu, rectal pouch Extremities: warm/dry, no LE edema  Skin: no rashes or lesions  UOP 4.7L/ 24hrs -2.4L Net +3.9L   Labs reviewed>   BC > 2/4 staph lugdunensis Resp Culture >> OP flora UC >> neg  Resolved Hospital Issues  Septic Shock secondary to Influenza and Strep Pneumoniae PNA, AKI   Assessment & Plan:   Severe ARDS due to flu and post flu bacterial pneumonia: Strep urinary antigen positive.  - paralytics stopped 12/22 - completed tamiflu 5 days Plan - cont PSV for now as has tolerated better> otherwise air stacks with vent dyssynchrony requiring lots of sedation and paralytics  - mental status remains a barrier to extubation  - backup full MV support, 4-8cc/kg IBW with goal Pplat <30 and DP<15 if needed -VAP prevention protocol/ PPI - PAD protocol for sedation> propofol, prn versed, fentanyl, enteral oxy IR/ klonopin - wean FiO2 as able for SpO2 >92%  - cont ceftriaxone for total of 7 days - intermittent CXR - d/c Aline   AKI (resolved) - Suspect prerenal in the setting of poor p.o. intake while ill, ATN in the setting of developing shock. Plan - cont foley - Trend BMP / urinary output - Replace electrolytes as indicated - Avoid nephrotoxic agents, ensure adequate renal perfusion  Toxic metabolic and now suspicious for anoxic encephalopathy Myoclonus  - Post-arrest he had really no meaningful interaction.  Brainstem reflexes intact.   Plan - plan for MRI brain today  - cont propofol, keppra, and prn versed for myoclonus - off ceribell 12/25> showed evidence of epileptogenicity with generalized onset with GPDs, concerning for ictal nature, w/profound diffuse encephalopathy, concerning for severe anoxic/ hypoxic brain injury - neuro not formally consulted, consider pending MRI results - Maintain neuro protective measures; goal for eurothermia, euglycemia, eunatermia, normoxia, and PCO2 goal of 35-40 - Seizure precautions   Severe Protein  calorie malnutrition: - cont TF    PEA arrest: Likely respiratory event related to above.   Staph Lugdunensis> 2/4 on BC - likely contaminant, repeat BC today.  Tmax 100.4 and ongoing leukocytosis but could be related to other factors> aspiration pna/ myoclonus   HTN: Bps labile - more hypertensive in afternoons Plan - d/c aline - cont amlodipine and coreg with prn hydralazine   Hypernatremia: Setting of salt load the IV fluids, diuresis. - s/p diuresis yesterday, hold today - cont free water q 1hr - trend BMET  Best practice (evaluated daily):  Diet/type: tubefeeds DVT prophylaxis: LMWH GI prophylaxis: PPI Lines: Central line, d/c aline Foley:  Yes, and it is still needed Code Status:  full code Last date of multidisciplinary goals of care discussion: family updated at bedside 12/24  Pending update 12/26, no family at bedside  Critical care time:    CRITICAL CARE Performed by: Posey Boyer   Total critical care time: 38 minutes  Critical care time was exclusive of separately billable procedures and treating other patients.  Critical care was necessary to treat or prevent imminent or life-threatening deterioration.  Critical care was time spent personally by me on the following activities: development of  treatment plan with patient and/or surrogate as well as nursing, discussions with consultants, evaluation of patient's response to treatment, examination of patient, obtaining history from patient or surrogate, ordering and performing treatments and interventions, ordering and review of laboratory studies, ordering and review of radiographic studies, pulse oximetry and re-evaluation of patient's condition.   Posey Boyer, NP [facility] 10/09/2022, 7:11 AM   Please see [email or web address] for pager details.  From 7A-7P if no response, please call [phone] After hours, please call ELink [phone]

## 2022-10-09 NOTE — IPAL (Signed)
  Interdisciplinary Goals of Care Family Meeting   Date carried out: 10/09/2022  Location of the meeting: Bedside  Member's involved: Nurse Practitioner, Bedside Registered Nurse, and Family Member or next of kin  Durable Power of Attorney or acting medical decision maker: wife, Damon [name]    Discussion: We discussed goals of care for Damon [name] .  Updated wife at bedside on MRI findings c/w diffuse anoxic brain injury.  Patient remains on propofol gtt, prn versed, keppra, however has ongoing myoclonus.  Wife understandably in shock and crying.  Ongoing support offered.  Chaplin offered.  Given his severe anoxic brain injury and ongoing myoclonus, recommendations were given DNR, open family visitation, and eventual plans for a compassionate extubation to allow a dignified and natural death.  Wife needs time to process and at this time is not ready to make any changes in code status.    Code status:   Code Status: Full Code   Disposition: Continue current acute care  Time spent for the meeting: 30 mina      [name] [facility] 10/09/2022, 5:15 PM  See Amion for pager If no response to pager, please call PCCM consult pager After 7:00 pm call Elink

## 2022-10-09 NOTE — Progress Notes (Signed)
  Transition of Care (TOC) Screening Note   Patient Details  Name: Banner Huckaba Date of Birth: 17-May-1961   Transition of Care [facility]) CM/SW Contact:    Lavenia Atlas, RN Phone Number: 10/09/2022, 6:56 PM    Transition of Care Department [facility]) has reviewed patient and no TOC needs have been identified at this time. We will continue to monitor patient advancement through interdisciplinary progression rounds. If new patient transition needs arise, please place a TOC consult.

## 2022-10-10 DIAGNOSIS — J9601 Acute respiratory failure with hypoxia: Secondary | ICD-10-CM | POA: Diagnosis not present

## 2022-10-10 DIAGNOSIS — G931 Anoxic brain damage, not elsewhere classified: Secondary | ICD-10-CM | POA: Diagnosis not present

## 2022-10-10 DIAGNOSIS — R4182 Altered mental status, unspecified: Secondary | ICD-10-CM

## 2022-10-10 LAB — GLUCOSE, CAPILLARY
Glucose-Capillary: 120 mg/dL — ABNORMAL HIGH (ref 70–99)
Glucose-Capillary: 122 mg/dL — ABNORMAL HIGH (ref 70–99)
Glucose-Capillary: 104 mg/dL — ABNORMAL HIGH (ref 70–99)
Glucose-Capillary: 105 mg/dL — ABNORMAL HIGH (ref 70–99)
Glucose-Capillary: 120 mg/dL — ABNORMAL HIGH (ref 70–99)
Glucose-Capillary: 118 mg/dL — ABNORMAL HIGH (ref 70–99)

## 2022-10-10 LAB — RENAL FUNCTION PANEL
Albumin: 2.4 g/dL — ABNORMAL LOW (ref 3.5–5.0)
Anion gap: 7 (ref 5–15)
BUN: 42 mg/dL — ABNORMAL HIGH (ref 6–20)
CO2: 31 mmol/L (ref 22–32)
Calcium: 8.3 mg/dL — ABNORMAL LOW (ref 8.9–10.3)
Chloride: 105 mmol/L (ref 98–111)
Creatinine, Ser: 0.83 mg/dL (ref 0.61–1.24)
GFR, Estimated: >60 mL/min (ref >60)
Glucose, Bld: 122 mg/dL — ABNORMAL HIGH (ref 70–99)
Phosphorus: 4.3 mg/dL (ref 2.5–4.6)
Potassium: 4.4 mmol/L (ref 3.5–5.1)
Sodium: 143 mmol/L (ref 135–145)

## 2022-10-10 LAB — CBC
HCT: 29.3 % — ABNORMAL LOW (ref 39.0–52.0)
Hemoglobin: 9.6 g/dL — ABNORMAL LOW (ref 13.0–17.0)
MCH: 27.8 pg (ref 26.0–34.0)
MCHC: 32.8 g/dL (ref 30.0–36.0)
MCV: 84.9 fL (ref 80.0–100.0)
Platelets: 352 10*3/uL (ref 150–400)
RBC: 3.45 MIL/uL — ABNORMAL LOW (ref 4.22–5.81)
RDW: 17 % — ABNORMAL HIGH (ref 11.5–15.5)
WBC: 22.5 10*3/uL — ABNORMAL HIGH (ref 4.0–10.5)
nRBC: 0 % (ref 0.0–0.2)

## 2022-10-10 LAB — MAGNESIUM: Magnesium: 2.5 mg/dL — ABNORMAL HIGH (ref 1.7–2.4)

## 2022-10-10 LAB — TRIGLYCERIDES: Triglycerides: 373 mg/dL — ABNORMAL HIGH (ref <150)

## 2022-10-10 MED ORDER — PROPOFOL 1000 MG/100ML IV EMUL
5.0000 ug/kg/min | INTRAVENOUS | Status: DC
  Administered 2022-10-10: 70 ug/kg/min via INTRAVENOUS
  Administered 2022-10-10: 30 ug/kg/min via INTRAVENOUS
  Administered 2022-10-10 – 2022-10-11 (×2): 70 ug/kg/min via INTRAVENOUS
  Administered 2022-10-11: 80 ug/kg/min via INTRAVENOUS
  Administered 2022-10-11: 55 ug/kg/min via INTRAVENOUS
  Administered 2022-10-11: 50 ug/kg/min via INTRAVENOUS
  Administered 2022-10-11: 70 ug/kg/min via INTRAVENOUS
  Administered 2022-10-11: 50 ug/kg/min via INTRAVENOUS
  Administered 2022-10-12 – 2022-10-18 (×45): 80 ug/kg/min via INTRAVENOUS
  Filled 2022-10-10 (×3): qty 100
  Filled 2022-10-10: qty 200
  Filled 2022-10-10 (×9): qty 100
  Filled 2022-10-10: qty 200
  Filled 2022-10-10 (×9): qty 100
  Filled 2022-10-10: qty 200
  Filled 2022-10-10 (×15): qty 100
  Filled 2022-10-10: qty 200
  Filled 2022-10-10 (×10): qty 100

## 2022-10-10 MED ORDER — LORAZEPAM 2 MG/ML IJ SOLN: 4.0000 mg | INTRAMUSCULAR | Status: AC

## 2022-10-10 MED ORDER — LORAZEPAM 2 MG/ML IJ SOLN
INTRAMUSCULAR | Status: AC
  Administered 2022-10-10: 4 mg via INTRAVENOUS
  Filled 2022-10-10: qty 2

## 2022-10-10 MED ORDER — VALPROATE SODIUM 100 MG/ML IV SOLN
3000.0000 mg | INTRAVENOUS | Status: AC
  Administered 2022-10-10: 3000 mg via INTRAVENOUS
  Filled 2022-10-10: qty 30

## 2022-10-10 MED ORDER — VALPROATE SODIUM 100 MG/ML IV SOLN
500.0000 mg | Freq: Three times a day (TID) | INTRAVENOUS | Status: DC
  Administered 2022-10-10 – 2022-10-13 (×10): 500 mg via INTRAVENOUS
  Filled 2022-10-10 (×16): qty 5

## 2022-10-10 MED ORDER — FUROSEMIDE 10 MG/ML IJ SOLN
40.0000 mg | Freq: Once | INTRAMUSCULAR | Status: AC
  Administered 2022-10-10: 40 mg via INTRAVENOUS
  Filled 2022-10-10: qty 4

## 2022-10-10 NOTE — Procedures (Signed)
Patient Name: Damon [name]  MRN: [id number]  Epilepsy Attending: Charlsie Quest  Referring Physician/Provider: Dr Ritta Slot Duration: 10/10/2022 0128 to 2034   Patient history: 61yo M with twitching s/p cardiac arrest. EEG to evaluate for seizure   Level of alertness: comatose   AEDs during EEG study: LEV, VPA, propofol, Clonazepam   Technical aspects: This EEG was obtained using a 10 lead EEG system positioned circumferentially without any parasagittal coverage (rapid EEG). Computer selected EEG is reviewed as  well as background features and all clinically significant events.   Description: EEG showed generalized periodic discharges (GPDS) with fluctuating frequency of 2-4Hz  In between GPDS, EEG showed generalized background suppression. Hyperventilation and photic stimulation were not performed.      ABNORMALITY - Periodic discharges, generalized ( GPDs) - Background suppression, generalized   IMPRESSION: This study showed evidence of epileptogenicity with generalized onset. The GPDs at times reach 4hz  in frequency which makes this concerning for ictal nature. Video is not available. Therefore, please correlate clinically for seizures.   Additionally there was evidence of profound diffuse encephalopathy.  In the setting of cardiac arrest, this EEG pattern is concerning for anoxic/hypoxic brain injury.  EEG appears similar to previous day   Tessie Ordaz 

## 2022-10-10 NOTE — Progress Notes (Signed)
Spoke with the patient's wife at the request of Dr. Tonia Brooms to explain the neurological findings.  Explained the poor prognosis from a neurological meaningful recovery standpoint.  She is adamant that we continue full scope of care for now and she will let us know when she is ready to let him go over the full understanding that he has likely suffered an anoxic brain injury that is not reversible and the person that they know him to be may not ever come back.  She just needs time to process this information.  I found it best not to press or any further since she also expressed some distrust of the medical system alluding to the fact that she did not want treatment withheld for concerns of expense because they are insured. I have communicated the plan to the care team.  Until he remains full scope of care, please consider transfer to [facility] for continuous EEG whilst he remains full scope of care. If the decision is made to limit care, then he can stay at [facility]. The goal of long-term EEG would be to ensure proper suppression of any seizures that may occur.  We do not have LTM EEG facilities available at [facility].  At best he can be treated with suppression of clinical seizures at bedside there.  -- Milon Dikes, MD Neurologist [facility] Pager: [phone]

## 2022-10-10 NOTE — Consult Note (Signed)
Neurology Consultation Reason for Consult: Concern for anoxic injury Referring Physician: Icard, B  CC: Anoxic brain injury  History is obtained from: Chart review  HPI: Lexander Tremblay is a 61 y.o. male who was initially admitted with influenza causing him to develop hypoxemic respiratory failure.  He was initially admitted by the hospitalist team, but subsequently underwent a PEA arrest.  He was noted that he was bradying down on telemetry, and when he was checked on he was found to be unresponsive with PEA.  It is unclear exactly how long he was pulseless given that he was continuing to have electrical activity but he received ROSC after 5 minutes with subsequent rearrest with ROSC after 2 minutes.   Since that time, he has remained comatose and yesterday was noted that he was having some twitching of his hands and therefore a ceribell EEG was obtained which captured GPDs.    Past Medical History:  Diagnosis Date   Elevated lipids    Hypertension      Family History  Problem Relation Age of Onset   Colon cancer Mother 37   Diabetes Mother    Lung cancer Father    Diabetes Sister    Heart attack Brother    Cancer Brother        type unknown     Social History:  reports that he has been smoking cigarettes. He has a 20.00 pack-year smoking history. He has never used smokeless tobacco. He reports that he does not drink alcohol and does not use drugs.   Exam: Current vital signs: BP 115/71   Pulse (!) 108   Temp 99.6 F (37.6 C) (Axillary)   Resp (!) 26   Ht 5\' 4"  (1.626 m)   Wt 64.1 kg   SpO2 99%   BMI 24.26 kg/m  Vital signs in last 24 hours: Temp:  [99 F (37.2 C)-100 F (37.8 C)] 99.6 F (37.6 C) (12/27 0034) Pulse Rate:  [87-109] 108 (12/27 0329) Resp:  [19-43] 26 (12/27 0329) BP: (92-140)/(56-106) 115/71 (12/27 0329) SpO2:  [94 %-100 %] 99 % (12/27 0329) Arterial Line BP: (111-126)/(50-65) 125/59 (12/26 0800) FiO2 (%):  [40 %-60 %] 40 % (12/27 0329) Weight:   [64.1 kg] 64.1 kg (12/27 0314)   Physical Exam  Appears well-developed and well-nourished.   Neuro: Mental Status: Comatose, does not open eyes or follow commands Cranial Nerves: II: does not blink to threat. Pupils are equal, round, and reactive to light.   III,IV, VI: Eyes are slightly disconjugate, no deviation V: VII: Corneals are intact X: Cough is intact Motor: No response to noxious stimulation in any extremity, other than he began tremoring bilateral hands following noxious stimulation to his left hand. Sensory: As above Cerebellar: Does not perform     I have reviewed labs in epic: Results for orders placed or performed during the hospital encounter of 10/03/22 (from the past 24 hour(s))  Glucose, capillary     Status: None   Collection Time: 10/09/22  4:52 AM  Result Value Ref Range   Glucose-Capillary 97 70 - 99 mg/dL  CBC with Differential/Platelet     Status: Abnormal   Collection Time: 10/09/22  5:00 AM  Result Value Ref Range   WBC 20.3 (H) 4.0 - 10.5 K/uL   RBC 3.33 (L) 4.22 - 5.81 MIL/uL   Hemoglobin 9.2 (L) 13.0 - 17.0 g/dL   HCT 10/11/22 (L) 16.1 - 09.6 %   MCV 83.5 80.0 - 100.0 fL   MCH  27.6 26.0 - 34.0 pg   MCHC 33.1 30.0 - 36.0 g/dL   RDW 82.4 (H) 23.5 - 36.1 %   Platelets 310 150 - 400 K/uL   nRBC 0.0 0.0 - 0.2 %   Neutrophils Relative % 67 %   Neutro Abs 13.4 (H) 1.7 - 7.7 K/uL   Lymphocytes Relative 8 %   Lymphs Abs 1.7 0.7 - 4.0 K/uL   Monocytes Relative 8 %   Monocytes Absolute 1.7 (H) 0.1 - 1.0 K/uL   Eosinophils Relative 1 %   Eosinophils Absolute 0.3 0.0 - 0.5 K/uL   Basophils Relative 0 %   Basophils Absolute 0.1 0.0 - 0.1 K/uL   WBC Morphology MILD LEFT SHIFT (1-5% METAS, OCC MYELO, OCC BANDS)    Immature Granulocytes 16 %   Abs Immature Granulocytes 3.21 (H) 0.00 - 0.07 K/uL  Basic metabolic panel     Status: Abnormal   Collection Time: 10/09/22  5:00 AM  Result Value Ref Range   Sodium 146 (H) 135 - 145 mmol/L   Potassium 4.0  3.5 - 5.1 mmol/L   Chloride 108 98 - 111 mmol/L   CO2 32 22 - 32 mmol/L   Glucose, Bld 99 70 - 99 mg/dL   BUN 45 (H) 6 - 20 mg/dL   Creatinine, Ser 4.43 0.61 - 1.24 mg/dL   Calcium 8.4 (L) 8.9 - 10.3 mg/dL   GFR, Estimated >15 >40 mL/min   Anion gap 6 5 - 15  Magnesium     Status: None   Collection Time: 10/09/22  5:00 AM  Result Value Ref Range   Magnesium 1.9 1.7 - 2.4 mg/dL  Glucose, capillary     Status: Abnormal   Collection Time: 10/09/22  7:44 AM  Result Value Ref Range   Glucose-Capillary 100 (H) 70 - 99 mg/dL   Comment 1 Notify RN    Comment 2 Document in Chart   Glucose, capillary     Status: Abnormal   Collection Time: 10/09/22 12:30 PM  Result Value Ref Range   Glucose-Capillary 108 (H) 70 - 99 mg/dL   Comment 1 Notify RN    Comment 2 Document in Chart   Glucose, capillary     Status: None   Collection Time: 10/09/22  4:27 PM  Result Value Ref Range   Glucose-Capillary 99 70 - 99 mg/dL   Comment 1 Notify RN    Comment 2 Document in Chart   Glucose, capillary     Status: None   Collection Time: 10/09/22  7:26 PM  Result Value Ref Range   Glucose-Capillary 92 70 - 99 mg/dL   Comment 1 Notify RN    Comment 2 Document in Chart   Glucose, capillary     Status: Abnormal   Collection Time: 10/09/22 11:16 PM  Result Value Ref Range   Glucose-Capillary 104 (H) 70 - 99 mg/dL   Comment 1 Notify RN    Comment 2 Document in Chart   Glucose, capillary     Status: Abnormal   Collection Time: 10/10/22  3:07 AM  Result Value Ref Range   Glucose-Capillary 120 (H) 70 - 99 mg/dL   Comment 1 Notify RN    Comment 2 Document in Chart      I have reviewed the images obtained: MRI brain-evidence and the degree of anoxic brain injury  Impression: 61 year old male with multiple findings consistent with poor prognosis in the setting of cardiac arrest.  He is now 5 days and with  no response to noxious stimulation which is a dismal prognostic indicator, GPD's on EEG are also felt  to be a poor prognostic indicator.  The twitching is unusual and seems fast for seizure, however when coupled with GPD's I did feel that attempting to suppress the movements would be prudent.  Ativan suppress the movements with no significant effect on his EEG.  Will try loading Depakote, but he is already on fairly high-dose propofol and has received benzodiazepines, I do not think that progression to barbiturates would be at all likely to improve his outcome and therefore do not feel it is indicated at this time.  At this point, I think we have enough information to say that he has a poor prognosis, if no improvement in EEG by morning, then I think would continue goals of care discussion with the family.  Recommendations: 1) continue propofol overnight 2) Depakote load, continue Keppra 3) propofol to suppress movements 4) could use Ativan if needed for movements 5) continue goals of care discussion with family.  This patient is critically ill and at significant risk of neurological worsening, death and care requires constant monitoring of vital signs, hemodynamics,respiratory and cardiac monitoring, neurological assessment, discussion with family, other specialists and medical decision making of high complexity. I spent 45 minutes of neurocritical care time  in the care of  this patient. This was time spent independent of any time provided by nurse practitioner or PA.  Ritta Slot, MD [facility] [phone]  If 7pm- 7am, please page neurology on call as listed in AMION. 10/10/2022  4:20 AM

## 2022-10-10 NOTE — Progress Notes (Signed)
Willeen Niece MD arrived on unit, asked for Ceribell to be placed on the patient @ 0128. At this time patient was actively shaking with no EKG changes. When ceribell was placed MD read ceribell and patient was actively seizing. 4mg  of Ativan given IV @ 0130. Ativan was effective of relieving current seizure symptoms.

## 2022-10-10 NOTE — Progress Notes (Signed)
Family (two sisters, two brothers) updated at bedside on plan of care, impending transfer to [facility] for cEEG monitoring.  Offered support. Questions answered.      Canary Brim, MSN, APRN, NP-C, AGACNP-BC [facility] 10/10/2022, 2:21 PM   Please see [email or web address] for pager details.   From 7A-7P if no response, please call [phone] After hours, please call ELink [phone]

## 2022-10-10 NOTE — Progress Notes (Signed)
Nutrition Follow-up  DOCUMENTATION CODES:   Not applicable  INTERVENTION:  Continue Goal tube feeding via OG: Vital 1.5 at 50 ml/h (1200 ml per day) Prosource TF20 60 ml BID Provides 1960 kcal, 121 gm protein, 917 ml free water daily + Free water flushes 156m Q4H (60101m = 151758may  - Monitor weight trends.  NUTRITION DIAGNOSIS:   Inadequate oral intake related to acute illness (respiratory failure/ventilated) as evidenced by NPO status. *ongoing  GOAL:   Patient will meet greater than or equal to 90% of their needs *being met with TFs  MONITOR:   Vent status, Labs, Weight trends, TF tolerance  REASON FOR ASSESSMENT:   Consult Enteral/tube feeding initiation and management  ASSESSMENT:   Pt admitted from home with the flu leading to acute respiratory failure with hypoxia and PEA arrest upon admission. PMH significant for HTN, HLD, chronic back pain, tobacco use.  12/20 Admit, patient intubated 12/22 TF of Vital 1.5 started at 43m41m 12/24 TF rate increased to goal of 50mL32m12/26 Changed to DNR. MRI brain with hypoxic/anoxic injury     Patient intubated and sedated at time of visit.  TF running at goal of 50mL/65mPer discussion with RN, patient tolerating well.  Labs WNL, blood sugars well controlled.  Per CCM note today, family discussions remain ongoing but patient with poor prognosis.   Medications reviewed and include: Colace, Lasix, Insulin, Miralax  Labs reviewed:  HA1C 5.9 Blood Glucose 92-120 x24 hours   Diet Order:   Diet Order             Diet NPO time specified  Diet effective now                   EDUCATION NEEDS:  No education needs have been identified at this time  Skin:  Skin Assessment: Reviewed RN Assessment  Last BM:  12/27  Height:  Ht Readings from Last 1 Encounters:  10/09/22 _0  (1.626 m)   Weight:  Wt Readings from Last 1 Encounters:  10/10/22 64.1 kg    BMI:  Body mass index is 24.26  kg/m.  Estimated Nutritional Needs:  Kcal:  1900-2100 Protein:  95-110g Fluid:  >/=1.9L    Kimimila Tauzin ThomasMarcello MooresDN For contact information, refer to [email or web address]

## 2022-10-10 NOTE — Progress Notes (Signed)
Wife updated at bedside on impending transfer.  She is frustrated the patient's brothers and sisters knew of his transfer before she was aware.  Support offered.       Canary Brim, MSN, APRN, NP-C, AGACNP-BC [facility] 10/10/2022, 4:18 PM   Please see [email or web address] for pager details.   From 7A-7P if no response, please call [phone] After hours, please call ELink [phone]

## 2022-10-10 NOTE — Progress Notes (Signed)
NAME:  Damon [name], MRN:  [id number], DOB:  Oct 26, 1960, LOS: 7 ADMISSION DATE:  10/03/2022, CONSULTATION DATE:  10/03/22 REFERRING MD: Dr. Ronaldo Miyamoto CHIEF COMPLAINT:  Resp Failure   History of Present Illness:  61 y/o M who has a PMH as below. He presented to [facility] urgent care 12/18 with confusion, worsening dyspnea after "recent respiratory infection", cough, congestion, fever, chills. On day of presentation, he began to stumble and almost fall and also had an episode of incontinence which is apparently not normal for him. He was hypoxic to 88% on RA and was placed on 5L O2 via [place] before being sent to [facility] ED for further evaluation and management.  He was seen by EDP early AM 12/19 and his flu swab was noted to be positive for flu A. He was discharged home from ED.  He then returned early AM 12/20 with persistent cough, N/V, worsening dyspnea. He was found to have O2 sat of 85% and was placed on BiPAP for increased WOB. CXR showed bilateral infiltrates R > L.  He was admitted by [facility] and was continued on BiPAP with CAP coverage initiation. A few hours after admission, he became agitated, pulling at lines and his BiPAP mask to the point that he ripped it off. He was then noted to have bradycardia and became unresponsive. A code blue was called. He was found in PEA and had roughly 5 minutes of ACLS before ROSC. During intubation, he had a 2nd brief arrest, presumed respiratory after an aspiration event.  Post code, he was minimally responsive (no sedation for intubation). Repeat labs were sent.   PCCM ultimately called for admission.  Pertinent  Medical History:  has Acute respiratory failure with hypoxia (HCC); CAP (community acquired pneumonia); Influenza and pneumonia; Elevated LFTs; HTN (hypertension); HLD (hyperlipidemia); Tobacco abuse; SIRS (systemic inflammatory response syndrome) (HCC); Influenza A; Cardiac arrest due to respiratory disorder (HCC); and Tobacco dependence on their problem  list.  Significant Hospital Events: Including procedures, antibiotic start and stop dates in addition to other pertinent events   12/18 Flu A positive 12/20 Admitted, 5 minute code after non-compliance with BiPAP 12/21 Intubated, paralyzed, ARDS protocol, on 7cc/kg 12/22 Weaned to 6 cc/kg with dyssynchrony, back on 7 cc/kg, driving pressures good, weaning vent 12/23 Attempt to wean sedation again with dyssynchrony and desaturation.  Some concern for cuff leak, tube exchange.  Cuff did not appear to be blown.  Ongoing signs of cuff leak, query leaking around cough, possible large trachea, diuresing 12/25 tolerating PSV, myoclonus> ceribell, Neuro consult, changed to propofol, diuresed  12/26 Changed to DNR. On Propofol, versed.  Tolerating PSV.  MRI brain with hypoxic/anoxic injury  Interim History / Subjective:  Tmax 100.6  BC pending  Vent - 40%, PEEP 10  I/O 1.5L UOP, +557ml in last 24 hours  RN reports concern for seizure overnight, propofol increased by Neurology, AED's  Objective:  Blood pressure 119/66, pulse (!) 110, temperature (!) 100.6 F (38.1 C), temperature source Oral, resp. rate (!) 25, height 5\' 4"  (1.626 m), weight 64.1 kg, SpO2 96 %.    Vent Mode: PRVC FiO2 (%):  [40 %-55 %] 40 % Set Rate:  [15 bmp] 15 bmp Vt Set:  [550 mL] 550 mL PEEP:  [8 cmH20-10 cmH20] 10 cmH20 Pressure Support:  [10 cmH20] 10 cmH20 Plateau Pressure:  [19 cmH20-26 cmH20] 19 cmH20   Intake/Output Summary (Last 24 hours) at 10/10/2022 0750 Last data filed at 10/10/2022 0428 Gross per 24 hour  Intake  2070.42 ml  Output 1525 ml  Net 545.42 ml   Filed Weights   10/06/22 0345 10/07/22 0500 10/10/22 0314  Weight: 69.5 kg 69.3 kg 64.1 kg    Examination: General: critically ill appearing adult male lying in bed on vent in NAD HEENT: MM pink/moist, ETT, Ceribell in place Neuro: sedate on propofol  CV: s1s2 RRR, no m/r/g, L IJ TLC  PULM: non-labored at rest, lungs bilaterally clear GI:  soft, bsx4 active, NGT, tolerating TF Extremities: warm/dry, generalized 1-2+ edema  Skin: no rashes or lesions  Resolved Hospital Issues  Septic Shock secondary to Influenza and Strep Pneumoniae PNA, AKI   Assessment & Plan:   Severe ARDS due to Flu and post flu Bacterial Pneumonia: Strep urinary antigen positive.  Paralytics stopped 12/22. Completed tamiflu 5 days -PRVC with LTVV -PSV wean as tolerated but mental status remains barrier for extubation  -wean PEEP / FiO2 for sats >90% -ceftriaxone D7/7 -follow intermittent CXR -VAP prevention measures  -PAD protocol for sedation  -droplet precautions   AKI (resolved) Suspect prerenal in the setting of poor p.o. intake while ill, ATN in the setting of developing shock. -continue foley for I/O's  -Trend BMP / urinary output -Replace electrolytes as indicated -Avoid nephrotoxic agents, ensure adequate renal perfusion   Toxic Metabolic and Anoxic Encephalopathy Myoclonus  Post-arrest he had really no meaningful interaction.  Brainstem reflexes intact.  MRI with findings consistent with anoxic injury. Ceribell showed evidence of epileptogenicity with generalized onset with GPD's concerning for ictal nature, with profound diffuse encephalopathy, supports anoxic encephalopathy.  -appreciate Neurology evaluation, will need further discussion with family re prognostication -continue Ceribell  -deep sedation with propofol per Neurology  -continue keppra, valproate -PRN versed  -neuro protective measures  -seizure precautions  Severe Protein Calorie Malnutrition  -continue TF    PEA arrest: Likely respiratory event related to above. -tele monitoring   Staph Lugdunensis 1/4 on BC Likely contaminant, repeat BC today.  Tmax 100.4 and ongoing leukocytosis but could be related to other factors> aspiration pna/ myoclonus  -monitor   HTN BP labile, more hypertensive in afternoons -continue amlodipine, coreg -PRN hydralazine    Hypernatremia In setting of salt load of the IV fluids, diuresis. -continue free water  -follow BMP  -lasix 40 mg IV x1   Anemia  -trend CBC   Best practice (evaluated daily):  Diet/type: tubefeeds DVT prophylaxis: LMWH GI prophylaxis: PPI Lines: Central line placed 12/20.   Foley:  Yes, and it is still needed Code Status:  DNR Last date of multidisciplinary goals of care discussion: family updated at bedside 12/26, see iPAL note.  Will update on arrival 12/27.   Critical care time: 36 minutes   Canary Brim, MSN, APRN, NP-C, AGACNP-BC [facility] 10/10/2022, 7:50 AM   Please see [email or web address] for pager details.   From 7A-7P if no response, please call [phone] After hours, please call ELink [phone]

## 2022-10-11 ENCOUNTER — Inpatient Hospital Stay: Payer: Self-pay

## 2022-10-11 ENCOUNTER — Inpatient Hospital Stay (HOSPITAL_COMMUNITY): Payer: Medicare Other

## 2022-10-11 DIAGNOSIS — G931 Anoxic brain damage, not elsewhere classified: Secondary | ICD-10-CM | POA: Diagnosis not present

## 2022-10-11 DIAGNOSIS — J9601 Acute respiratory failure with hypoxia: Secondary | ICD-10-CM | POA: Diagnosis not present

## 2022-10-11 LAB — RENAL FUNCTION PANEL
Albumin: 2.1 g/dL — ABNORMAL LOW (ref 3.5–5.0)
Anion gap: 5 (ref 5–15)
BUN: 35 mg/dL — ABNORMAL HIGH (ref 6–20)
CO2: 33 mmol/L — ABNORMAL HIGH (ref 22–32)
Calcium: 8.3 mg/dL — ABNORMAL LOW (ref 8.9–10.3)
Chloride: 106 mmol/L (ref 98–111)
Creatinine, Ser: 0.8 mg/dL (ref 0.61–1.24)
GFR, Estimated: >60 mL/min (ref >60)
Glucose, Bld: 111 mg/dL — ABNORMAL HIGH (ref 70–99)
Phosphorus: 3.6 mg/dL (ref 2.5–4.6)
Potassium: 5 mmol/L (ref 3.5–5.1)
Sodium: 144 mmol/L (ref 135–145)

## 2022-10-11 LAB — CBC
HCT: 27.7 % — ABNORMAL LOW (ref 39.0–52.0)
Hemoglobin: 9 g/dL — ABNORMAL LOW (ref 13.0–17.0)
MCH: 27.5 pg (ref 26.0–34.0)
MCHC: 32.5 g/dL (ref 30.0–36.0)
MCV: 84.7 fL (ref 80.0–100.0)
Platelets: 384 10*3/uL (ref 150–400)
RBC: 3.27 MIL/uL — ABNORMAL LOW (ref 4.22–5.81)
RDW: 16.5 % — ABNORMAL HIGH (ref 11.5–15.5)
WBC: 18.9 10*3/uL — ABNORMAL HIGH (ref 4.0–10.5)
nRBC: 0 % (ref 0.0–0.2)

## 2022-10-11 LAB — MAGNESIUM: Magnesium: 2.3 mg/dL (ref 1.7–2.4)

## 2022-10-11 LAB — GLUCOSE, CAPILLARY
Glucose-Capillary: 105 mg/dL — ABNORMAL HIGH (ref 70–99)
Glucose-Capillary: 112 mg/dL — ABNORMAL HIGH (ref 70–99)
Glucose-Capillary: 112 mg/dL — ABNORMAL HIGH (ref 70–99)
Glucose-Capillary: 115 mg/dL — ABNORMAL HIGH (ref 70–99)
Glucose-Capillary: 119 mg/dL — ABNORMAL HIGH (ref 70–99)
Glucose-Capillary: 84 mg/dL (ref 70–99)

## 2022-10-11 MED ORDER — ORAL CARE MOUTH RINSE: 15.0000 mL | OROMUCOSAL | Status: DC | PRN

## 2022-10-11 MED ORDER — SODIUM CHLORIDE 0.9% FLUSH
10.0000 mL | Freq: Two times a day (BID) | INTRAVENOUS | Status: DC
  Administered 2022-10-11: 10 mL
  Administered 2022-10-12: 20 mL
  Administered 2022-10-12 – 2022-10-18 (×12): 10 mL

## 2022-10-11 MED ORDER — SODIUM CHLORIDE 0.9% FLUSH: 10.0000 mL | INTRAVENOUS | Status: DC | PRN

## 2022-10-11 MED ORDER — ORAL CARE MOUTH RINSE: 15.0000 mL | OROMUCOSAL | Status: DC

## 2022-10-11 NOTE — Progress Notes (Signed)
Neurology Progress Note  Brief HPI: Patient with history of hypertension and hyperlipidemia was initially admitted with influenza, developed hypoxic respiratory failure and PEA arrest.  It was unclear how long he was pulseless, but ROSC was achieved after 5 minutes.  Patient arrested again and ROSC was achieved after 2 minutes that time.  He was then noted to have some twitching of his hands, with GPD's seen on ceribell.  Propofol was initiated to suppress myoclonic twitching.  Patient is now 6 days postarrest and still has no response to noxious stimuli, but family wishes for him to remain full code with aggressive care at this time.  He has been transferred here for monitoring with long-term EEG.  Subjective: Patient has been transferred to the neurological ICU at [facility] from [facility], continues to have no response to noxious stimuli on propofol at 40, no myoclonic twitching observed  Exam: Vitals:   10/11/22 1510 10/11/22 1520  BP: 105/76   Pulse: 90 88  Resp: 19 (!) 22  Temp: 98.6 F (37 C)   SpO2: 97% 95%   Gen: Intubated patient in no acute distress Resp: Respirations synchronous with ventilator  Neuro: Mental Status: Does not respond to voice, no response to noxious stimuli, unable to follow commands Cranial Nerves: Pupils midline 3 mm and reactive to light, no oculocephalic reflex, does not focus or track examiner, positive corneal reflex face appears symmetrical with ET tube Motor: No spontaneous movement or movement to noxious stimuli Sensory: No response to noxious stimuli Gait: Deferred  Pertinent Labs:    Latest Ref Rng & Units 10/11/2022    5:03 AM 10/10/2022    4:14 AM 10/09/2022    5:00 AM  CBC  WBC 4.0 - 10.5 K/uL 18.9  22.5  20.3   Hemoglobin 13.0 - 17.0 g/dL 9.0  9.6  9.2   Hematocrit 39.0 - 52.0 % 27.7  29.3  27.8   Platelets 150 - 400 K/uL 384  352  310        Latest Ref Rng & Units 10/11/2022    5:03 AM 10/10/2022    4:14 AM  10/09/2022    5:00 AM  BMP  Glucose 70 - 99 mg/dL 725  366  99   BUN 6 - 20 mg/dL 35  42  45   Creatinine 0.61 - 1.24 mg/dL 4.40  3.47  4.25   Sodium 135 - 145 mmol/L 144  143  146   Potassium 3.5 - 5.1 mmol/L 5.0  4.4  4.0   Chloride 98 - 111 mmol/L 106  105  108   CO2 22 - 32 mmol/L 33  31  32   Calcium 8.9 - 10.3 mg/dL 8.3  8.3  8.4      Imaging Reviewed:  MRI brain: Fairly symmetric DWI and T2 flair intense signal abnormality in bilateral basal ganglia, compatible with acute hypoxic/ischemic injury  Assessment: 61 year old patient with history of hypertension and hyperlipidemia initially admitted with influenza underwent hypoxic respiratory failure as well as PEA arrest.  6 days postarrest, he continues to be unresponsive to noxious stimuli with GPD's seen on EEG.  He has had twitching movements of the hands, and propofol Depakote and Keppra have been given to suppress seizure activity.  Goals of care discussion with family is ongoing, but they wish for him to remain full code with aggressive interventions at this time.  Will connect patient to long-term EEG, if no seizure activity is seen we will try to  wean propofol.  Likelihood of neurologically meaningful recovery remains grim-this was discussed in detail with the wife by the attending on the phone call 2 days ago but she wishes full scope of care for which transfer was recommended.  Exam unchanged from the prior.  Prognosis also remains unchanged from prior.  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 Agree with the history and physical as documented above. Agree with the plan as documented, which I helped formulate. I have independently reviewed the chart, obtained history, review of systems and examined the patient.I have personally reviewed pertinent head/neck/spine imaging (CT/MRI).  Plan was discussed with Dr. Tonia Brooms. Please feel free to call with any questions.  -- Milon Dikes, MD Neurologist [facility] Pager: [phone]  CRITICAL CARE ATTESTATION Performed by: Milon Dikes, MD Total critical care time: 30 minutes Critical care time was exclusive of separately billable procedures and treating other patients and/or supervising APPs/Residents/Students Critical care was necessary to treat or prevent imminent or life-threatening deterioration. This patient is critically ill and at significant risk for neurological worsening and/or death and care requires constant monitoring. Critical care was time spent personally by me on the following activities: development of treatment plan with patient and/or surrogate as well as nursing, discussions with consultants, evaluation of patient's response to treatment, examination of patient, obtaining history from patient or surrogate, ordering and performing treatments and interventions, ordering and review of laboratory studies, ordering and review of radiographic studies, pulse oximetry, re-evaluation of patient's condition, participation in multidisciplinary rounds and medical decision making of high complexity in the care of this patient.

## 2022-10-11 NOTE — Progress Notes (Signed)
NAME:  Damon [name], MRN:  [id number], DOB:  September 01, 1961, LOS: 8 ADMISSION DATE:  10/03/2022, CONSULTATION DATE:  10/03/22 REFERRING MD: Dr. Ronaldo Miyamoto CHIEF COMPLAINT:  Resp Failure   History of Present Illness:  61 y/o M who has a PMH as below. He presented to [facility] urgent care 12/18 with confusion, worsening dyspnea after "recent respiratory infection", cough, congestion, fever, chills. On day of presentation, he began to stumble and almost fall and also had an episode of incontinence which is apparently not normal for him. He was hypoxic to 88% on RA and was placed on 5L O2 via [place] before being sent to [facility] ED for further evaluation and management.  He was seen by EDP early AM 12/19 and his flu swab was noted to be positive for flu A. He was discharged home from ED.  He then returned early AM 12/20 with persistent cough, N/V, worsening dyspnea. He was found to have O2 sat of 85% and was placed on BiPAP for increased WOB. CXR showed bilateral infiltrates R > L.  He was admitted by [facility] and was continued on BiPAP with CAP coverage initiation. A few hours after admission, he became agitated, pulling at lines and his BiPAP mask to the point that he ripped it off. He was then noted to have bradycardia and became unresponsive. A code blue was called. He was found in PEA and had roughly 5 minutes of ACLS before ROSC. During intubation, he had a 2nd brief arrest, presumed respiratory after an aspiration event.  Post code, he was minimally responsive (no sedation for intubation). Repeat labs were sent.   PCCM ultimately called for admission.  Pertinent  Medical History:  has Acute respiratory failure with hypoxia (HCC); CAP (community acquired pneumonia); Influenza and pneumonia; Elevated LFTs; HTN (hypertension); HLD (hyperlipidemia); Tobacco abuse; SIRS (systemic inflammatory response syndrome) (HCC); Influenza A; Cardiac arrest due to respiratory disorder (HCC); and Tobacco dependence on their problem  list.  Significant Hospital Events: Including procedures, antibiotic start and stop dates in addition to other pertinent events   12/18 Flu A positive 12/20 Admitted, 5 minute code after non-compliance with BiPAP 12/21 Intubated, paralyzed, ARDS protocol, on 7cc/kg 12/22 Weaned to 6 cc/kg with dyssynchrony, back on 7 cc/kg, driving pressures good, weaning vent 12/23 Attempt to wean sedation again with dyssynchrony and desaturation.  Some concern for cuff leak, tube exchange.  Cuff did not appear to be blown.  Ongoing signs of cuff leak, query leaking around cough, possible large trachea, diuresing 12/25 tolerating PSV, myoclonus> ceribell, Neuro consult, changed to propofol, diuresed  12/26 Changed to DNR. On Propofol, versed.  Tolerating PSV.  MRI brain with hypoxic/anoxic injury 12/27 GPD's on EEG, pending transfer to [facility] for cEEG  Interim History / Subjective:  Tmax 99.4 / WBC down to 18.9  Glucose range 105-120  I/O 3.7L UOP, -1.5L in last 24h  RN reports no acute changes / events  Objective:  Blood pressure 110/65, pulse (!) 102, temperature 99.4 F (37.4 C), temperature source Axillary, resp. rate 19, height 5\' 4"  (1.626 m), weight 64.6 kg, SpO2 94 %.    Vent Mode: PRVC FiO2 (%):  [40 %] 40 % Set Rate:  [15 bmp] 15 bmp Vt Set:  [550 mL] 550 mL PEEP:  [8 cmH20-10 cmH20] 8 cmH20 Plateau Pressure:  [19 cmH20-23 cmH20] 19 cmH20   Intake/Output Summary (Last 24 hours) at 10/11/2022 0717 Last data filed at 10/11/2022 0622 Gross per 24 hour  Intake 2223.87 ml  Output 3750  ml  Net -1526.13 ml   Filed Weights   10/07/22 0500 10/10/22 0314 10/11/22 0500  Weight: 69.3 kg 64.1 kg 64.6 kg    Examination: General: critically ill appearing adult male lying in bed in NAD on vent  HEENT: MM pink/moist, ETT, pupils pinpoint, arcus senilis Neuro: deeply sedate, +corneals, -gag/cough CV: s1s2 RRR, no m/r/g, L IJ TLC clean/dry PULM: non-labored at rest, lungs bilaterally clear  GI:  soft, bsx4 active  Extremities: warm/dry, 1-2+ generalized edema, space boots in place Skin: no rashes or lesions  Resolved Hospital Issues  Septic Shock secondary to Influenza and Strep Pneumoniae PNA, AKI   Assessment & Plan:   Severe ARDS due to Flu and post flu Bacterial Pneumonia: Strep urinary antigen positive.  Paralytics stopped 12/22. Completed tamiflu 5 days -PRVC with LTVV  -PSV as tolerated, mental status remains barrier for extubation -completed ceftriaxone for strep pneumoniae PNA -follow CXR intermittently  -VAP prevention measures  -PAD protocol for sedation, propofol / suppression per Neurology  -droplet precautions  AKI (resolved) Suspect prerenal in the setting of poor p.o. intake while ill, ATN in the setting of developing shock. -foley for I/O's  -Trend BMP / urinary output -Replace electrolytes as indicated -Avoid nephrotoxic agents, ensure adequate renal perfusion  Toxic Metabolic and Anoxic Encephalopathy Myoclonus  Post-arrest he had really no meaningful interaction.  Brainstem reflexes intact.  MRI with findings consistent with anoxic injury. Ceribell showed evidence of epileptogenicity with generalized onset with GPD's concerning for ictal nature, with profound diffuse encephalopathy, supports anoxic encephalopathy.  -appreciate Neurology, ongoing discussions for prognostication  -continue Ceribell, pending transfer to [facility] for continuous EEG  -sedation/suppression per Neurology  -continue keppra, valproate -PRN versed -PT oxycodone for pain  -seizure precautions  -neuro protective measures  Severe Protein Calorie Malnutrition  -TF per nutrition    PEA arrest: Likely respiratory event related to above. -tele monitoring   Staph Lugdunensis 1/4 on BC Likely contaminant, repeat [facility] 12/26 pending.  Ongoing leukocytosis but could be related to other factors> aspiration pna/ myoclonus  -follow up BC, NGTD  -monitor fever curve / WBC trend  -PICC line  placement given expected duration of ICU stay, discontinue TLC once placed  HTN BP labile, more hypertensive in afternoons -coreg, amlodipine PT  -PRN hydralazine   Hypernatremia In setting of salt load of the IV fluids, diuresis. -free water  -follow BMP  -hold lasix 12/28   Anemia  -follow CBC    GOC  -palliative care consulted to assist in GOC  Best practice (evaluated daily):  Diet/type: tubefeeds DVT prophylaxis: LMWH GI prophylaxis: PPI Lines: Central line placed 12/20.  PICC ordered.  Foley:  Yes, and it is still needed Code Status:  DNR Last date of multidisciplinary goals of care discussion: multiple family members updated at bedside 12/28. Will update family on arrival. Wife prefers to be updated before other family.   Critical care time: 35 minutes   Canary Brim, MSN, APRN, NP-C, AGACNP-BC [facility] 10/11/2022, 7:17 AM   Please see [email or web address] for pager details.   From 7A-7P if no response, please call [phone] After hours, please call ELink [phone]

## 2022-10-11 NOTE — Progress Notes (Signed)
Patient was transferred via carelink to [facility] 4N room 32. Wife has been notified of departure. Nurse receiving patient is aware that he has left [facility]. VSS prior to transport, medications given as prescribed before departure.

## 2022-10-11 NOTE — Progress Notes (Signed)
LTM EEG hooked up and running - no initial skin breakdown - push button tested - [facility] monitoring.  

## 2022-10-11 NOTE — Progress Notes (Signed)
Pt noted to have tremors in his right hand. Pt's button pushed at this time to alert the monitoring team.

## 2022-10-11 NOTE — Progress Notes (Signed)
Wife, Sonny Anthes, updated by phone on plan of care for day - including possible PICC line placement. Questions answered.       Canary Brim, MSN, APRN, NP-C, AGACNP-BC [facility] 10/11/2022, 9:27 AM   Please see [email or web address] for pager details.   From 7A-7P if no response, please call [phone] After hours, please call ELink [phone]

## 2022-10-12 ENCOUNTER — Inpatient Hospital Stay (HOSPITAL_COMMUNITY): Payer: Medicare Other

## 2022-10-12 DIAGNOSIS — G931 Anoxic brain damage, not elsewhere classified: Secondary | ICD-10-CM | POA: Diagnosis not present

## 2022-10-12 DIAGNOSIS — R4182 Altered mental status, unspecified: Secondary | ICD-10-CM | POA: Diagnosis not present

## 2022-10-12 DIAGNOSIS — J9601 Acute respiratory failure with hypoxia: Secondary | ICD-10-CM | POA: Diagnosis not present

## 2022-10-12 LAB — GLUCOSE, CAPILLARY
Glucose-Capillary: 100 mg/dL — ABNORMAL HIGH (ref 70–99)
Glucose-Capillary: 116 mg/dL — ABNORMAL HIGH (ref 70–99)
Glucose-Capillary: 118 mg/dL — ABNORMAL HIGH (ref 70–99)
Glucose-Capillary: 120 mg/dL — ABNORMAL HIGH (ref 70–99)
Glucose-Capillary: 124 mg/dL — ABNORMAL HIGH (ref 70–99)
Glucose-Capillary: 126 mg/dL — ABNORMAL HIGH (ref 70–99)

## 2022-10-12 LAB — RENAL FUNCTION PANEL
Albumin: 2 g/dL — ABNORMAL LOW (ref 3.5–5.0)
Anion gap: 5 (ref 5–15)
BUN: 30 mg/dL — ABNORMAL HIGH (ref 6–20)
CO2: 30 mmol/L (ref 22–32)
Calcium: 8.2 mg/dL — ABNORMAL LOW (ref 8.9–10.3)
Chloride: 101 mmol/L (ref 98–111)
Creatinine, Ser: 0.8 mg/dL (ref 0.61–1.24)
GFR, Estimated: >60 mL/min (ref >60)
Glucose, Bld: 121 mg/dL — ABNORMAL HIGH (ref 70–99)
Phosphorus: 3.5 mg/dL (ref 2.5–4.6)
Potassium: 4.9 mmol/L (ref 3.5–5.1)
Sodium: 136 mmol/L (ref 135–145)

## 2022-10-12 LAB — CBC
HCT: 28.2 % — ABNORMAL LOW (ref 39.0–52.0)
Hemoglobin: 9.2 g/dL — ABNORMAL LOW (ref 13.0–17.0)
MCH: 27.5 pg (ref 26.0–34.0)
MCHC: 32.6 g/dL (ref 30.0–36.0)
MCV: 84.4 fL (ref 80.0–100.0)
Platelets: 430 10*3/uL — ABNORMAL HIGH (ref 150–400)
RBC: 3.34 MIL/uL — ABNORMAL LOW (ref 4.22–5.81)
RDW: 16.3 % — ABNORMAL HIGH (ref 11.5–15.5)
WBC: 21.4 10*3/uL — ABNORMAL HIGH (ref 4.0–10.5)
nRBC: 0 % (ref 0.0–0.2)

## 2022-10-12 MED ORDER — ORAL CARE MOUTH RINSE: 15.0000 mL | OROMUCOSAL | Status: DC | PRN

## 2022-10-12 MED ORDER — CHLORHEXIDINE GLUCONATE CLOTH 2 % EX PADS
6.0000 | MEDICATED_PAD | Freq: Every day | CUTANEOUS | Status: DC
  Administered 2022-10-13 – 2022-11-05 (×26): 6 via TOPICAL

## 2022-10-12 MED ORDER — ORAL CARE MOUTH RINSE: 15.0000 mL | OROMUCOSAL | Status: DC

## 2022-10-12 NOTE — Progress Notes (Signed)
LTM maint complete - no skin breakdown under: A1 F3 F7 Serviced several leads [facility] monitored, Event button test confirmed by [facility].

## 2022-10-12 NOTE — Progress Notes (Signed)
NAME:  Damon [name], MRN:  [id number], DOB:  [birth year], LOS: 9 ADMISSION DATE:  10/03/2022, CONSULTATION DATE:  12/20 REFERRING MD:  Ronaldo Miyamoto, CHIEF COMPLAINT:  Dyspnea   History of Present Illness:  61 y/o male admitted to [facility] on 12/20 in setting of acute hypoxemic respiratory failure due to Flu A. Placed on BIPAP.  Had a 5 minute PEA arrest, required intubation.  After several days on mechanical ventilation remains comatose, MRI brain findings worrisome for anoxic brain injury.  Moved to [facility] for LTM EEG monitoring.  Pertinent  Medical History   Hypertension Hyperlipidemia Tobacco abuse   Significant Hospital Events: Including procedures, antibiotic start and stop dates in addition to other pertinent events   12/18 Flu A positive 12/20 Admitted, 5 minute code after non-compliance with BiPAP 12/21 Intubated, paralyzed, ARDS protocol, on 7cc/kg 12/22 Weaned to 6 cc/kg with dyssynchrony, back on 7 cc/kg, driving pressures good, weaning vent 12/23 Attempt to wean sedation again with dyssynchrony and desaturation.  Some concern for cuff leak, tube exchange.  Cuff did not appear to be blown.  Ongoing signs of cuff leak, query leaking around cough, possible large trachea, diuresing 12/25 tolerating PSV, myoclonus> ceribell, Neuro consult, changed to propofol, diuresed  12/26 Changed to DNR. On Propofol, versed.  Tolerating PSV.  MRI brain with hypoxic/anoxic injury 12/27 GPD's on EEG, pending transfer to [facility] for cEEG 12/28 moved to [facility] for LTM EEG  Interim History / Subjective:   Moved to [facility] for LTM EEG No acute events Has PICC order placed Myoclonic jerking noted  Objective   Blood pressure 114/81, pulse 94, temperature 100.3 F (37.9 C), temperature source Axillary, resp. rate (!) 21, height 5\' 4"  (1.626 m), weight 64.7 kg, SpO2 97 %.    Vent Mode: PRVC FiO2 (%):  [40 %] 40 % Set Rate:  [15 bmp] 15 bmp Vt Set:  [550 mL] 550 mL PEEP:  [8 cmH20] 8 cmH20 Plateau Pressure:  [17  cmH20-22 cmH20] 21 cmH20   Intake/Output Summary (Last 24 hours) at 10/12/2022 1014 Last data filed at 10/12/2022 1000 Gross per 24 hour  Intake 2754.93 ml  Output 3010 ml  Net -255.07 ml   Filed Weights   10/11/22 0500 10/11/22 1510 10/12/22 0600  Weight: 64.6 kg 60.1 kg 64.7 kg    Examination:  General:  In bed on vent HENT: NCAT ETT in place PULM: CTA B, vent supported breathing CV: RRR, no mgr GI: BS+, soft, nontender MSK: normal bulk and tone Neuro: GCS 3 some irregular fasciculations right hand  Resolved Hospital Problem list   AKI Hypernatremia  Assessment & Plan:  ARDS due to Flu/strep pneumo pneumonia Anoxic brain injury Cardiac arrest Severe protein calorie malnutrition Hypertension Normocytic Anemia without bleeding  Discussion: Remains comatose, picture worrisome for severe anoxic brain injury  Plan: Appreciate neurology consultation team support Discuss goals of care with family after neuro team updates them Full mechanical vent support VAP prevention Monitor off antibiotics Monitor for bleeding Transfuse PRBC for Hgb < 7 gm/dL Monitor BMET and UOP Replace electrolytes as needed Tube feeds to continue Remove CVL Place PICC   Best Practice (right click and "Reselect all SmartList Selections" daily)   Diet/type: tubefeeds DVT prophylaxis: LMWH GI prophylaxis: PPI Lines: Central line and No longer needed.  Order written to d/c  > replace with PICC Foley:  Yes, and it is still needed Code Status:  DNR Last date of multidisciplinary goals of care discussion [12/26]  Critical care time: 35 minutes  Heber , MD [place] [facility] Pager: [phone] Cell: [phone] After 7:00 pm call Elink  [phone]

## 2022-10-12 NOTE — Progress Notes (Signed)
Neurology Progress Note  Brief HPI: Patient with history of hypertension and hyperlipidemia was initially admitted with influenza, developed hypoxic respiratory failure and PEA arrest.  It was unclear how long he was pulseless, but ROSC was achieved after 5 minutes.  Patient arrested again and ROSC was achieved after 2 minutes that time.  He was then noted to have some twitching of his hands, with GPD's seen on ceribell.  Propofol was initiated to suppress myoclonic twitching.  Patient is now 60 days postarrest and still has no response to noxious stimuli, but family wishes for him to remain full code with aggressive care at this time.  He has been transferred here for monitoring with long-term EEG.  Subjective: RN and CCM at the bedside. LTM in place Floridly myoclonic today despite propofol. Propofol increased to by RN while we were examining.   Exam: Vitals:   10/12/22 0700 10/12/22 0800  BP: 114/75 121/87  Pulse: (!) 102 100  Resp: (!) 22 (!) 21  Temp:    SpO2: 98% 95%   Gen: Intubated patient in no acute distress Resp: Respirations synchronous with ventilator  Neuro: Mental Status: Does not respond to voice, no response to noxious stimuli, unable to follow commands Cranial Nerves: Pupils midline 3 mm and reactive to light, no oculocephalic reflex, does not focus or track examiner, positive corneal reflex face appears symmetrical with ET tube Motor: No spontaneous movement or movement to noxious stimuli Sensory: No response to noxious stimuli Gait: Deferred  Pertinent Labs:    Latest Ref Rng & Units 10/12/2022    6:15 AM 10/11/2022    5:03 AM 10/10/2022    4:14 AM  CBC  WBC 4.0 - 10.5 K/uL 21.4  18.9  22.5   Hemoglobin 13.0 - 17.0 g/dL 9.2  9.0  9.6   Hematocrit 39.0 - 52.0 % 28.2  27.7  29.3   Platelets 150 - 400 K/uL 430  384  352        Latest Ref Rng & Units 10/12/2022    6:15 AM 10/11/2022    5:03 AM 10/10/2022    4:14 AM  BMP  Glucose 70 - 99 mg/dL  132  440  102   BUN 6 - 20 mg/dL 30  35  42   Creatinine 0.61 - 1.24 mg/dL 7.25  3.66  4.40   Sodium 135 - 145 mmol/L 136  144  143   Potassium 3.5 - 5.1 mmol/L 4.9  5.0  4.4   Chloride 98 - 111 mmol/L 101  106  105   CO2 22 - 32 mmol/L 30  33  31   Calcium 8.9 - 10.3 mg/dL 8.2  8.3  8.3      Imaging Reviewed:  MRI brain: Fairly symmetric DWI and T2 flair intense signal abnormality in bilateral basal ganglia, compatible with acute hypoxic/ischemic injury  LTM 10/11/2022 1625 to 10/12/2022 0930 - This study showed evidence of generalized epileptogenicity with high potential for seizures as well as profound diffuse encephalopathy. In the setting of cardiac arrest, this EEG pattern is suggestive of anoxic/hypoxic brain injury. Event button was pressed on 10/11/2022 at 1951 and on 10/12/2022 at 0153 for right arm twitching ( not visible on camera). Concomitant EEG showed generalized epileptiform discharges at 3-4hz . These episodes could be myoclonic seizures but difficult to confirm as events are not visible on camera.    Assessment: 61 year old patient with history of hypertension and hyperlipidemia initially admitted with influenza underwent hypoxic respiratory failure as  well as PEA arrest. Cardiac arrest occurred on 12/19 at 2345, he continues to be unresponsive to noxious stimuli with GPD's seen on EEG.  He has had twitching movements of the hands, and propofol Depakote and Keppra have been given to suppress seizure activity.  Goals of care discussion with family is ongoing, but they wish for him to remain full code with aggressive interventions at this time.  LTM shows concern for generalized epileptogenicity and he is floridly myoclonic on exam. Propofol has been increased and PRN versed is available as well.   Likelihood of neurologically meaningful recovery remains grim this far out from cardiac arrest without any indication of meaningful higher cortical function-this was discussed in detail  with the wife by the attending on the phone call 3 days ago but she wished full scope of care for which transfer was recommended.    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 We will be available to attend if needed.  Discussed wit Dr. Kendrick Fries  Patient seen and examined by NP/APP with MD. MD to update note as needed.   Elmer Picker, DNP, FNP-BC [facility] Pager: [phone]   Attending Neurohospitalist Addendum Patient seen and examined with APP/Resident. Agree with the history and physical as documented above. Agree with the plan as documented, which I helped formulate. I have independently reviewed the chart, obtained history, review of systems and examined the patient.I have personally reviewed pertinent head/neck/spine imaging (CT/MRI). Please feel free to call with any questions.  -- Milon Dikes, MD Neurologist [facility] Pager: [phone]  CRITICAL CARE ATTESTATION Performed by: Milon Dikes, MD Total critical care time: 30 minutes Critical care time was exclusive of separately billable procedures and treating other patients and/or supervising APPs/Residents/Students Critical care was necessary to treat or prevent imminent or life-threatening deterioration. This patient is critically ill and at significant risk for neurological worsening and/or death and care requires constant monitoring. Critical care was time spent personally by me on the following activities: development of treatment plan with patient and/or surrogate as well as nursing, discussions with consultants, evaluation of patient's response to treatment, examination of patient,  obtaining history from patient or surrogate, ordering and performing treatments and interventions, ordering and review of laboratory studies, ordering and review of radiographic studies, pulse oximetry, re-evaluation of patient's condition, participation in multidisciplinary rounds and medical decision making of high complexity in the care of this patient.

## 2022-10-12 NOTE — Procedures (Addendum)
Patient Name: Damon [name]  MRN: [id number]  Epilepsy Attending: Charlsie Quest  Referring Physician/Provider: Milon Dikes, MD  Duration: 10/11/2022 1625 to 10/12/2022 1625  Patient history: 61yo M s/p cardiac arrest. EEG to evaluate for seizure  Level of alertness:  comatose  AEDs during EEG study: LEV, VPA, Propofol  Technical aspects: This EEG study was done with scalp electrodes positioned according to the 10-20 International system of electrode placement. Electrical activity was reviewed with band pass filter of 1-70Hz , sensitivity of 7 uV/mm, display speed of 58mm/sec with a 60Hz  notched filter applied as appropriate. EEG data were recorded continuously and digitally stored.  Video monitoring was available and reviewed as appropriate.  Description: EEG predominantly showed burst suppression with generalized highly epileptiform bursts lasting 0.5-2 seconds alternating with generalized suppression lasting 0.5-1 seconds. EEG was not reactive to tactile stimulation.   Event button was pressed on 10/11/2022 at 1951 and on 10/12/2022 at 0153 for right arm twitching ( not visible on camera). Concomitant EEG showed generalized epileptiform discharges at 3-4hz .   Hyperventilation and photic stimulation were not performed.      IMPRESSION: - Burst suppression with highly epileptiform bursts, generalized   IMPRESSION: This study showed evidence of generalized epileptogenicity with high potential for seizures as well as profound diffuse encephalopathy. In the setting of cardiac arrest, this EEG pattern is suggestive of anoxic/hypoxic brain injury.  Event button was pressed on 10/11/2022 at 1951 and on 10/12/2022 at 0153 for right arm twitching ( not visible on camera). Concomitant EEG showed generalized epileptiform discharges at 3-4hz . These episodes could be myoclonic seizures but difficult to confirm as events are not visible on camera.    [name] 10/14/2022

## 2022-10-13 DIAGNOSIS — Z7189 Other specified counseling: Secondary | ICD-10-CM

## 2022-10-13 DIAGNOSIS — J9601 Acute respiratory failure with hypoxia: Secondary | ICD-10-CM | POA: Diagnosis not present

## 2022-10-13 DIAGNOSIS — Z66 Do not resuscitate: Secondary | ICD-10-CM | POA: Diagnosis not present

## 2022-10-13 DIAGNOSIS — Z515 Encounter for palliative care: Secondary | ICD-10-CM

## 2022-10-13 DIAGNOSIS — R4182 Altered mental status, unspecified: Secondary | ICD-10-CM | POA: Diagnosis not present

## 2022-10-13 LAB — BASIC METABOLIC PANEL
Anion gap: 9 (ref 5–15)
BUN: 28 mg/dL — ABNORMAL HIGH (ref 6–20)
CO2: 29 mmol/L (ref 22–32)
Calcium: 8.5 mg/dL — ABNORMAL LOW (ref 8.9–10.3)
Chloride: 98 mmol/L (ref 98–111)
Creatinine, Ser: 0.73 mg/dL (ref 0.61–1.24)
GFR, Estimated: >60 mL/min (ref >60)
Glucose, Bld: 121 mg/dL — ABNORMAL HIGH (ref 70–99)
Potassium: 5.1 mmol/L (ref 3.5–5.1)
Sodium: 136 mmol/L (ref 135–145)

## 2022-10-13 LAB — GLUCOSE, CAPILLARY
Glucose-Capillary: 107 mg/dL — ABNORMAL HIGH (ref 70–99)
Glucose-Capillary: 118 mg/dL — ABNORMAL HIGH (ref 70–99)
Glucose-Capillary: 134 mg/dL — ABNORMAL HIGH (ref 70–99)
Glucose-Capillary: 84 mg/dL (ref 70–99)
Glucose-Capillary: 95 mg/dL (ref 70–99)
Glucose-Capillary: 96 mg/dL (ref 70–99)

## 2022-10-13 LAB — TRIGLYCERIDES: Triglycerides: 192 mg/dL — ABNORMAL HIGH (ref <150)

## 2022-10-13 MED ORDER — VALPROIC ACID 250 MG/5ML PO SOLN
500.0000 mg | Freq: Three times a day (TID) | ORAL | Status: DC
  Administered 2022-10-13 – 2023-01-22 (×302): 500 mg
  Filled 2022-10-13 (×306): qty 10

## 2022-10-13 MED ORDER — LORAZEPAM 2 MG/ML IJ SOLN
4.0000 mg | Freq: Once | INTRAMUSCULAR | Status: AC
  Administered 2022-10-13: 4 mg via INTRAVENOUS
  Filled 2022-10-13: qty 2

## 2022-10-13 MED ORDER — LEVETIRACETAM 750 MG PO TABS
1500.0000 mg | ORAL_TABLET | Freq: Two times a day (BID) | ORAL | Status: DC
  Administered 2022-10-13 – 2022-10-18 (×10): 1500 mg
  Filled 2022-10-13 (×10): qty 2

## 2022-10-13 MED ORDER — FAMOTIDINE 20 MG PO TABS
20.0000 mg | ORAL_TABLET | Freq: Two times a day (BID) | ORAL | Status: DC
  Administered 2022-10-14 – 2022-10-26 (×26): 20 mg
  Filled 2022-10-13 (×26): qty 1

## 2022-10-13 MED ORDER — KETAMINE HCL-SODIUM CHLORIDE 1000-0.9 MG/100ML-% IV SOLN: 1.0000 mg/kg/h | INTRAVENOUS | Status: DC

## 2022-10-13 MED ORDER — KETAMINE HCL-SODIUM CHLORIDE 1000-0.9 MG/100ML-% IV SOLN
1.0000 mg/kg/h | INTRAVENOUS | Status: DC
  Administered 2022-10-13 (×2): 1 mg/kg/h via INTRAVENOUS
  Filled 2022-10-13 (×2): qty 100

## 2022-10-13 MED ORDER — KETAMINE BOLUS VIA INFUSION
1.5000 mg/kg | Freq: Once | INTRAVENOUS | Status: AC
  Administered 2022-10-13: 93.3 mg via INTRAVENOUS
  Filled 2022-10-13: qty 95

## 2022-10-13 NOTE — Progress Notes (Signed)
NAME:  Damon [name], MRN:  [id number], DOB:  14-Jan-1961, LOS: 10 ADMISSION DATE:  10/03/2022, CONSULTATION DATE:  12/20 REFERRING MD:  Ronaldo Miyamoto, CHIEF COMPLAINT:  Dyspnea   History of Present Illness:  61 y/o male admitted to [facility] on 12/20 in setting of acute hypoxemic respiratory failure due to Flu A. Placed on BIPAP.  Had a 5 minute PEA arrest, required intubation.  After several days on mechanical ventilation remains comatose, MRI brain findings worrisome for anoxic brain injury.  Moved to [facility] for LTM EEG monitoring.  Pertinent  Medical History   Hypertension Hyperlipidemia Tobacco abuse   Significant Hospital Events: Including procedures, antibiotic start and stop dates in addition to other pertinent events   12/18 Flu A positive 12/20 Admitted, 5 minute code after non-compliance with BiPAP 12/21 Intubated, paralyzed, ARDS protocol, on 7cc/kg 12/22 Weaned to 6 cc/kg with dyssynchrony, back on 7 cc/kg, driving pressures good, weaning vent 12/23 Attempt to wean sedation again with dyssynchrony and desaturation.  Some concern for cuff leak, tube exchange.  Cuff did not appear to be blown.  Ongoing signs of cuff leak, query leaking around cough, possible large trachea, diuresing 12/25 tolerating PSV, myoclonus> ceribell, Neuro consult, changed to propofol, diuresed  12/26 Changed to DNR. On Propofol, versed.  Tolerating PSV.  MRI brain with hypoxic/anoxic injury 12/27 GPD's on EEG, pending transfer to [facility] for cEEG 12/28 moved to [facility] for LTM EEG  Interim History / Subjective:   No acute events  Objective   Blood pressure 110/74, pulse 99, temperature 99.5 F (37.5 C), temperature source Oral, resp. rate (!) 23, height 5\' 4"  (1.626 m), weight 62.2 kg, SpO2 96 %.    Vent Mode: PRVC FiO2 (%):  [40 %] 40 % Set Rate:  [15 bmp] 15 bmp Vt Set:  [550 mL] 550 mL PEEP:  [8 cmH20] 8 cmH20 Plateau Pressure:  [20 cmH20-21 cmH20] 21 cmH20   Intake/Output Summary (Last 24 hours) at 10/13/2022  0851 Last data filed at 10/13/2022 0700 Gross per 24 hour  Intake 2431.8 ml  Output 3110 ml  Net -678.2 ml   Filed Weights   10/11/22 1510 10/12/22 0600 10/13/22 0400  Weight: 60.1 kg 64.7 kg 62.2 kg    Examination:  General:  In bed on vent HENT: NCAT ETT in place PULM: CTA B, vent supported breathing CV: RRR, no mgr GI: BS+, soft, nontender MSK: normal bulk and tone Neuro: minimal movement on vent   Resolved Hospital Problem list   AKI Hypernatremia  Assessment & Plan:  ARDS due to Flu/strep pneumo pneumonia Anoxic brain injury Cardiac arrest Severe protein calorie malnutrition Hypertension Normocytic Anemia without bleeding  Discussion: Remains comatose, picture worrisome for severe anoxic brain injury.   Plan: Full mechanical vent support VAP prevention Will be unable to do WUA/SBT given severity of brain injry Monitor off antibiotics Monitor BMET and UOP Replace electrolytes as needed Monitor for bleeding Transfuse PRBC for Hgb < 7 gm/dL Maintain CVL for now, if family desires full scope of treatment may need to consider PICC in long term    Best Practice (right click and "Reselect all SmartList Selections" daily)   Diet/type: tubefeeds DVT prophylaxis: LMWH GI prophylaxis: PPI Lines: CVL, maintain Foley:  Yes, and it is still needed Code Status:  DNR Last date of multidisciplinary goals of care discussion [12/26]  Critical care time: 31 minutes    10/15/22, MD [place] [facility] Pager: [phone] Cell: [phone] After 7:00 pm call Elink  [phone]

## 2022-10-13 NOTE — Progress Notes (Signed)
Neurology Progress Note  Brief HPI: Patient with history of hypertension and hyperlipidemia was initially admitted with influenza, developed hypoxic respiratory failure and PEA arrest.  It was unclear how long he was pulseless, but ROSC was achieved after 5 minutes.  Patient arrested again and ROSC was achieved after 2 minutes that time.  He was then noted to have some twitching of his hands, with GPD's seen on ceribell.  Propofol was initiated to suppress myoclonic twitching.  Patient is now 6 days postarrest and still has no response to noxious stimuli, but family wishes for him to remain full code with aggressive care at this time.  He has been transferred here for monitoring with long-term EEG.  Subjective: RN and CCM at the bedside. LTM in place Floridly myoclonic today despite propofol. Propofol increased to by RN while we were examining.   Exam: Vitals:   10/13/22 0700 10/13/22 0759  BP: 110/74   Pulse:  99  Resp: 20 (!) 23  Temp:    SpO2:  96%   Gen: Intubated patient in no acute distress Resp: Respirations synchronous with ventilator  Neuro: Mental Status: Does not respond to voice, no response to noxious stimuli, unable to follow commands Cranial Nerves: Pupils midline 3 mm and reactive to light, no oculocephalic reflex, does not focus or track examiner, positive corneal reflex face appears symmetrical with ET tube Motor: No spontaneous movement or movement to noxious stimuli Sensory: No response to noxious stimuli Gait: Deferred  Pertinent Labs:    Latest Ref Rng & Units 10/12/2022    6:15 AM 10/11/2022    5:03 AM 10/10/2022    4:14 AM  CBC  WBC 4.0 - 10.5 K/uL 21.4  18.9  22.5   Hemoglobin 13.0 - 17.0 g/dL 9.2  9.0  9.6   Hematocrit 39.0 - 52.0 % 28.2  27.7  29.3   Platelets 150 - 400 K/uL 430  384  352        Latest Ref Rng & Units 10/13/2022    6:10 AM 10/12/2022    6:15 AM 10/11/2022    5:03 AM  BMP  Glucose 70 - 99 mg/dL 607  371  062   BUN 6  - 20 mg/dL 28  30  35   Creatinine 0.61 - 1.24 mg/dL 6.94  8.54  6.27   Sodium 135 - 145 mmol/L 136  136  144   Potassium 3.5 - 5.1 mmol/L 5.1  4.9  5.0   Chloride 98 - 111 mmol/L 98  101  106   CO2 22 - 32 mmol/L 29  30  33   Calcium 8.9 - 10.3 mg/dL 8.5  8.2  8.3      Imaging Reviewed:  MRI brain: Fairly symmetric DWI and T2 flair intense signal abnormality in bilateral basal ganglia, compatible with acute hypoxic/ischemic injury  LTM 10/11/2022 1625 to 10/12/2022 0930 - This study showed evidence of generalized epileptogenicity with high potential for seizures as well as profound diffuse encephalopathy. In the setting of cardiac arrest, this EEG pattern is suggestive of anoxic/hypoxic brain injury. Event button was pressed on 10/11/2022 at 1951 and on 10/12/2022 at 0153 for right arm twitching ( not visible on camera). Concomitant EEG showed generalized epileptiform discharges at 3-4hz . These episodes could be myoclonic seizures but difficult to confirm as events are not visible on camera.    Assessment: 61 year old patient with history of hypertension and hyperlipidemia initially admitted with influenza underwent hypoxic respiratory failure as well as  PEA arrest. Cardiac arrest occurred on 12/19 at 2345, he continues to be unresponsive to noxious stimuli with GPD's seen on EEG.  He has had twitching movements of the hands, and propofol Depakote and Keppra have been given to suppress seizure activity.  Goals of care discussion with family is ongoing, but they wish for him to remain full code with aggressive interventions at this time.  LTM shows concern for generalized epileptogenicity and he is floridly myoclonic on exam. Propofol has been increased and PRN versed is available as well.   Likelihood of neurologically meaningful recovery remains grim this far out from cardiac arrest without any indication of meaningful higher cortical function-this was discussed in detail with the wife by the  attending on the phone call 3 days ago but she wished full scope of care for which transfer was recommended.    Impression:  Myoclonic seizure activity in patient with anoxic brain injury  Recommendations: 1) continue long-term EEG 2) Maintain propofol at given concern for myoclonic seizures 3) continue Depakote at 500 mg every 8 hours 4) continue Keppra 1500 mg every 12 hours 5) Ketamine bolus 1.5mg /kg and then 1mg /kg/hr infusion for 24 hours  6) ongoing goals of care discussion with family-reiterate above plan. We will be available to attend if needed.  Discussed wit Dr.  Patient seen and examined by NP/APP with MD. MD to update note as needed.   Kendrick Fries, DNP, FNP-BC [facility] Pager: [phone]  Attending addendum Patient seen and examined Poor neurological exam Spoke with the wife in detail and a family meeting today. Describe there is clinical exam, EEG and imaging are suggestive of devastating anoxic brain injury which is likely reversible and the chances of neurologically meaningful recovery are grim.  She continues to hold onto hope and given that he is still showing a lot of epileptiform activity without any active seizures on his EEG, I would like to try ketamine to see if that has any change on his EEG pattern although I highly doubt that that is going to change the outcome in any way but at least that would ensure that we have done everything in our available arsenal of treatments to give his brain the best chance of any recovery. Wife is understanding and complementary of the plan. He received ketamine bolus around 12:53 PM.  We will continue ketamine to about 12:53 PM tomorrow and then stop the ketamine.  Continue propofol.  Continue Depakote, Keppra.  Continue on LTM We will continue to follow Plan was discussed with Dr. [phone] from the CCM team and Kendrick Fries from the palliative team  -- Lamarr Lulas, MD Neurologist [facility] Pager: [phone]  CRITICAL CARE ATTESTATION Performed by: [phone], MD Total critical care time: 50 minutes Critical care time was exclusive of separately billable procedures and treating other patients and/or supervising APPs/Residents/Students Critical care was necessary to treat or prevent imminent or life-threatening deterioration. This patient is critically ill and at significant risk for neurological worsening and/or death and care requires constant monitoring. Critical care was time spent personally by me on the following activities: development of treatment plan with patient and/or surrogate as well as nursing, discussions with consultants, evaluation of patient's response to treatment, examination of patient, obtaining history from patient or surrogate, ordering and performing treatments and interventions, ordering and review of laboratory studies, ordering and review of radiographic studies, pulse oximetry, re-evaluation of patient's condition, participation in multidisciplinary rounds and medical decision making of high complexity  in the care of this patient.

## 2022-10-13 NOTE — Procedures (Addendum)
Patient Name: Damon [name]  MRN: [id number]  Epilepsy Attending: Charlsie Quest  Referring Physician/Provider: Milon Dikes, MD  Duration: 10/12/2022 1625 to 10/13/2022 1625   Patient history: 61yo M s/p cardiac arrest. EEG to evaluate for seizure   Level of alertness:  comatose   AEDs during EEG study: LEV, VPA, Propofol   Technical aspects: This EEG study was done with scalp electrodes positioned according to the 10-20 International system of electrode placement. Electrical activity was reviewed with band pass filter of 1-70Hz , sensitivity of 7 uV/mm, display speed of 36mm/sec with a 60Hz  notched filter applied as appropriate. EEG data were recorded continuously and digitally stored.  Video monitoring was available and reviewed as appropriate.   Description: EEG predominantly showed burst suppression with near continuous generalized highly epileptiform bursts and brief 0.5-1 second of generalized suppression. EEG was not reactive to tactile stimulation. Hyperventilation and photic stimulation were not performed.      IMPRESSION: - Burst suppression with highly epileptiform bursts, generalized   IMPRESSION: This study showed evidence of generalized epileptogenicity with high potential for seizures as well as profound diffuse encephalopathy. In the setting of cardiac arrest, this EEG pattern is suggestive of anoxic/hypoxic brain injury.    Damon [name] [name]

## 2022-10-13 NOTE — Progress Notes (Signed)
Dr. Wilford Corner spoke with the wife. She is very understanding that the outcome is going to be extremely poor but at this point is not willing to make any further decisions for care limitation. Was told that the plan was to start him on ketamine and keep him on ketamine for 24 hours and then upon discontinuing ketamine, if he still continues to have the anoxic brain injury pattern, there is nothing further to be done from a neurological standpoint. She agrees that that should be done and if that does not work, she might just then decide for withdrawal of care.   Marcelino Duster from Palliative also spoke with patients wife, [name] this evening. She is very much aware of the reality that the present measures may not lead to great improvement. She expresses knowledge that Loring would likely not want to live like this indefinitely. She wants to give it the recommended amount of time per neurology and make decisions from there.

## 2022-10-13 NOTE — Plan of Care (Signed)
Plan of Care  Ketamine bolus ordered at 1.5mg /kg Ketamine infusion at 1mg /kg/hr with propofol infusion    Dr. at the bedside   Wilford Corner, DNP, FNP-BC [facility] Pager: [phone]

## 2022-10-13 NOTE — Progress Notes (Signed)
Lead maintenance complete, all impeds under 10 and no skin breakdown at all skin sites.

## 2022-10-13 NOTE — Progress Notes (Signed)
Ketamine infusion began at 1251, Bolus started @ 1252

## 2022-10-13 NOTE — Progress Notes (Signed)
LTM maint complete - no skin breakdown under: Fp1 F3 f7 Serviced  P3 P8 O1 [facility] monitored, Event button test confirmed by [facility].

## 2022-10-13 NOTE — Consult Note (Addendum)
Palliative Medicine Inpatient Consult Note  Consulting Provider: Josephine Igo, DO   Reason for consult:   Palliative Care Consult Services Palliative Medicine Consult  Reason for Consult? anoxic brain injury   10/13/2022  HPI:  Per intake H&P --> 61 y/o male admitted to [facility] on 12/20 in setting of acute hypoxemic respiratory failure due to Flu A. Placed on BIPAP. Had a 5 minute PEA arrest, required intubation. After several days on mechanical ventilation remains comatose, MRI brain findings worrisome for anoxic brain injury. Moved to [facility] for LTM EEG monitoring.   Palliative care has been asked to get involved for further goals of care conversations in the setting of anoxic brain injury post arrest.  Clinical Assessment/Goals of Care:  *Please note that this is a verbal dictation therefore any spelling or grammatical errors are due to the "Dragon Medical One" system interpretation.  I have reviewed medical records including EPIC notes, labs and imaging, received report from bedside RN, assessed the patient who is somnolent, has an EEG monitor in place, spoke to the RT at bedside.    I called patients spouse, [name] this morning to further discuss diagnosis prognosis, GOC, EOL wishes, disposition and options.   I introduced Palliative Medicine as specialized medical care for people living with serious illness. It focuses on providing relief from the symptoms and stress of a serious illness. The goal is to improve quality of life for both the patient and the family.  Medical History Review and Understanding:  Yuvan has a past medical history significant for hypertension, hyperlipidemia.  Per his wife he was in very good health and did not take any medications.  Social History:  Time he lives in a single-family home in [place] with his wife and 2 daughters.  They have been married for the past 37 years.  They share 5 children, 3 of his sons and 1 of Jennifer's daughters, 1  daughter is biologically theirs.  They have 3 grandchildren.  He formally worked at [employer] in [employer].  Unfortunately he had endured a back injury therefore has been on disability for a number of years.  He is a man of faith and practices within Christianity, Holiness more specifically.  Per his wife he is identified as independent and extremely loving.  Functional and Nutritional State:  Preceding hospitalization Terrence was fully functional of all B ADLs and IADLs.  His wife shares that she suffers from MS and he was actually her caretaker.  He had a hearty appetite preceding admission as well.  Advance Directives:  A detailed discussion was had today regarding advanced directives.  Patient's spouse, [name] is a Social research officer, government.  He does not and has not completed prior advanced directives.  Code Status:  Concepts specific to code status, artifical feeding and hydration, continued IV antibiotics and rehospitalization was had.  The difference between a aggressive medical intervention path  and a palliative comfort care path for this patient at this time was had.   Encouraged patient/family to consider DNAR status understanding evidenced based poor outcomes in similar hospitalized patient, as the cause of arrest is likely associated with advanced chronic/terminal illness rather than an easily reversible acute cardiac event. I explained that DNAR does not change the medical plan and it only comes into effect after a person has arrested (died).  It is a protective measure to keep [place] from harming the patient in their last moments of life. Patient would not receive CPR, defibrillation, ACLS medications.  Patient's  spouse does agree that if Rhonda were to suffer a cardiac arrest she would not want chest compressions or ACLS medications rather she realizes it would be God calling [place] home.  Discussion:  Discussed Symeon's present clinical state and the events preceding  hospitalization.  Per [name] he had had a lingering cough which was ongoing though she never anticipated what has now happened.  Since being diagnosed with influenza A he went into severe respiratory failure causing PEA arrest.  I shared very honestly with [name] that God had already called [place] home but through advancements in modern technology the medical team was able to bring him to a point where his heart remains beating and his breathing is supported by a ventilator.  I shared that he will not be the same person she wants knew given the severity of his anoxic brain injury.  We reviewed the importance of considering what time he would want in terms of his quality of life.  I shared that if we continue on this path we will likely be discussing things such as a tracheostomy and long-term gastrostomy tube for artificial life support.  I reviewed that if he was an independent man who took pride in his day-to-day ability to perform activities that I am not sure that this would necessarily be the path she or he would want for himself.  I offered alternatively the idea of Zhyon being made comfortable and passing away peacefully through the support of the palliative medicine team.  I asked [name] if we could coordinate a meeting with additional family members for further conversation on decision making.  She expresses that her children will defer to her so it will be she who will make any decisions regarding his care.  She expresses that today she does not desire to make any decisions but she does realize the significance of his clinical state and what will need to be talked about moving forward.  She vocalizes that she does not want to be selfish though for her at this time just being able to look at his face for few hours a day is enough.  Unfortunately, during conversation [name] shared that her knees were weak she was feeling nauseated and she needed to lay down.  I expressed that we did not need  to talk any longer and what was most important is she take time to rest and process the information provided.  [name] did states she be in the hospital around 10 today to see and spend time with Eliyah.  She does plan to speak with her sister to see if there is a time that she may be able to come in a social support for additional conversations to be held with the medical team.  Discussed the importance of continued conversation with family and their  medical providers regarding overall plan of care and treatment options, ensuring decisions are within the context of the patients values and GOCs. ______________________________ Addendum:  I spoke to patients spouse, [name] this evening. We discussed patients present health state. Reviewed her conversation with the neurology team to try one more intervention to see if his condition can be improved. If not she shares that she will accept his fate. She goes on to tell me that she does not believe that Gabe would want to live dependent on others for the duration of his life.   Offer support through therapeutic listening. [name] shares it is making it harder for her to make a decision if she remains  at bedside therefore she plans to come only for 45 minute increments.   Plan for trial of ketamine over 24 hours if not effective for further decisions to be made from there in terms of possible transition to comfort.  Decision Maker: Jezek,Jennifer (Spouse): [phone] (Home Phone)   SUMMARY OF RECOMMENDATIONS   DNAR --> patient's wife would accept if patients heart were to suddenly stop and would not want additional resuscitative efforts made  Open and honest conversations were held in the setting of Tom's anoxic brain injury and the possible decisions we are encroaching upon  Discussed the option of continuing present care transitioning focus to more of a comfort emphasis  Impressed upon [name] the importance of having a formal meeting  with family present  to determine course to take moving forward  Ongoing palliative support  Code Status/Advance Care Planning: DNAR  Palliative Prophylaxis:  Aspiration, Bowel Regimen, Delirium Protocol, Frequent Pain Assessment, Oral Care, Palliative Wound Care, and Turn Reposition  Additional Recommendations (Limitations, Scope, Preferences): Continue current care  Psycho-social/Spiritual:  Desire for further Chaplaincy support: Patient practices within [facility] Additional Recommendations:    Prognosis: Overall quite poor  Discharge Planning: Unclear at this time  Vitals:   10/13/22 0500 10/13/22 0600  BP: 127/79 110/69  Pulse:    Resp: (!) 25 (!) 21  Temp:    SpO2:      Intake/Output Summary (Last 24 hours) at 10/13/2022 8295 Last data filed at 10/13/2022 0600 Gross per 24 hour  Intake 2960 ml  Output 3330 ml  Net -370 ml   Last Weight  Most recent update: 10/13/2022  4:19 AM    Weight  62.2 kg (137 lb 2 oz)            Gen: Acutely ill-appearing African-American male intubated HEENT: ETT, OGT, dry mucous membranes CV: Regular rate and rhythm PULM: Mechanical ventilation ABD: soft/nontender EXT: No edema Neuro: Somnolent not arousable, on sedation  PPS: 10%   This conversation/these recommendations were discussed with patient primary care team, Dr. Kendrick Fries  Billing based on MDM: High ______________________________________________________ Lamarr Lulas [facility] Palliative Medicine Team Team Cell Phone: [phone] Please utilize secure chat with additional questions, if there is no response within 30 minutes please call the above phone number  Palliative Medicine Team providers are available by phone from 7am to 7pm daily and can be reached through the team cell phone.  Should this patient require assistance outside of these hours, please call the patient's attending physician.

## 2022-10-14 DIAGNOSIS — J9601 Acute respiratory failure with hypoxia: Secondary | ICD-10-CM | POA: Diagnosis not present

## 2022-10-14 DIAGNOSIS — Z515 Encounter for palliative care: Secondary | ICD-10-CM | POA: Diagnosis not present

## 2022-10-14 DIAGNOSIS — R4182 Altered mental status, unspecified: Secondary | ICD-10-CM | POA: Diagnosis not present

## 2022-10-14 LAB — COMPREHENSIVE METABOLIC PANEL
ALT: 92 U/L — ABNORMAL HIGH (ref 0–44)
AST: 95 U/L — ABNORMAL HIGH (ref 15–41)
Albumin: 2.1 g/dL — ABNORMAL LOW (ref 3.5–5.0)
Alkaline Phosphatase: 117 U/L (ref 38–126)
Anion gap: 7 (ref 5–15)
BUN: 29 mg/dL — ABNORMAL HIGH (ref 6–20)
CO2: 30 mmol/L (ref 22–32)
Calcium: 8.4 mg/dL — ABNORMAL LOW (ref 8.9–10.3)
Chloride: 98 mmol/L (ref 98–111)
Creatinine, Ser: 0.78 mg/dL (ref 0.61–1.24)
GFR, Estimated: >60 mL/min (ref >60)
Glucose, Bld: 117 mg/dL — ABNORMAL HIGH (ref 70–99)
Potassium: 4.7 mmol/L (ref 3.5–5.1)
Sodium: 135 mmol/L (ref 135–145)
Total Bilirubin: 0.5 mg/dL (ref 0.3–1.2)
Total Protein: 7.1 g/dL (ref 6.5–8.1)

## 2022-10-14 LAB — CULTURE, BLOOD (ROUTINE X 2)
Culture: NO GROWTH
Culture: NO GROWTH
Special Requests: ADEQUATE
Special Requests: ADEQUATE

## 2022-10-14 LAB — CBC
HCT: 28 % — ABNORMAL LOW (ref 39.0–52.0)
Hemoglobin: 9.5 g/dL — ABNORMAL LOW (ref 13.0–17.0)
MCH: 28.2 pg (ref 26.0–34.0)
MCHC: 33.9 g/dL (ref 30.0–36.0)
MCV: 83.1 fL (ref 80.0–100.0)
Platelets: 462 10*3/uL — ABNORMAL HIGH (ref 150–400)
RBC: 3.37 MIL/uL — ABNORMAL LOW (ref 4.22–5.81)
RDW: 16.1 % — ABNORMAL HIGH (ref 11.5–15.5)
WBC: 25.6 10*3/uL — ABNORMAL HIGH (ref 4.0–10.5)
nRBC: 0 % (ref 0.0–0.2)

## 2022-10-14 LAB — GLUCOSE, CAPILLARY
Glucose-Capillary: 102 mg/dL — ABNORMAL HIGH (ref 70–99)
Glucose-Capillary: 103 mg/dL — ABNORMAL HIGH (ref 70–99)
Glucose-Capillary: 109 mg/dL — ABNORMAL HIGH (ref 70–99)
Glucose-Capillary: 115 mg/dL — ABNORMAL HIGH (ref 70–99)
Glucose-Capillary: 115 mg/dL — ABNORMAL HIGH (ref 70–99)
Glucose-Capillary: 99 mg/dL (ref 70–99)

## 2022-10-14 LAB — TRIGLYCERIDES: Triglycerides: 194 mg/dL — ABNORMAL HIGH (ref <150)

## 2022-10-14 MED ORDER — IPRATROPIUM-ALBUTEROL 0.5-2.5 (3) MG/3ML IN SOLN
3.0000 mL | Freq: Three times a day (TID) | RESPIRATORY_TRACT | Status: DC
  Administered 2022-10-14 – 2022-10-15 (×5): 3 mL via RESPIRATORY_TRACT
  Filled 2022-10-14 (×5): qty 3

## 2022-10-14 MED ORDER — PIPERACILLIN-TAZOBACTAM 3.375 G IVPB
3.3750 g | Freq: Three times a day (TID) | INTRAVENOUS | Status: AC
  Administered 2022-10-14 – 2022-10-21 (×21): 3.375 g via INTRAVENOUS
  Filled 2022-10-14 (×22): qty 50

## 2022-10-14 NOTE — Procedures (Signed)
Patient Name: Damon [name]  MRN: [id number]  Epilepsy Attending: Charlsie Quest  Referring Physician/Provider: Milon Dikes, MD  Duration: 10/13/2022 1625 to 10/14/2022 1625   Patient history: 61yo M s/p cardiac arrest. EEG to evaluate for seizure   Level of alertness:  comatose   AEDs during EEG study: LEV, VPA, Clonazepam, Propofol, Ketamine   Technical aspects: This EEG study was done with scalp electrodes positioned according to the 10-20 International system of electrode placement. Electrical activity was reviewed with band pass filter of 1-70Hz , sensitivity of 7 uV/mm, display speed of 52mm/sec with a 60Hz  notched filter applied as appropriate. EEG data were recorded continuously and digitally stored.  Video monitoring was available and reviewed as appropriate.   Description: EEG predominantly showed burst suppression with near continuous generalized highly epileptiform bursts and brief 0.5-1 second of generalized suppression. EEG was not reactive to tactile stimulation. Hyperventilation and photic stimulation were not performed.      IMPRESSION: - Burst suppression with highly epileptiform bursts, generalized   IMPRESSION: This study showed evidence of generalized epileptogenicity with high potential for seizures as well as profound diffuse encephalopathy. In the setting of cardiac arrest, this EEG pattern is suggestive of anoxic/hypoxic brain injury.    Damon [name] 

## 2022-10-14 NOTE — Progress Notes (Signed)
Neurology Progress Note  Brief HPI: Patient with history of hypertension and hyperlipidemia was initially admitted with influenza, developed hypoxic respiratory failure and PEA arrest.  It was unclear how long he was pulseless, but ROSC was achieved after 5 minutes.  Patient arrested again and ROSC was achieved after 2 minutes that time.  He was then noted to have some twitching of his hands, with GPD's seen on ceribell.  Propofol was initiated to suppress myoclonic twitching.  Patient is now 6 days postarrest and still has no response to noxious stimuli, but family wishes for him to remain full code with aggressive care at this time.  He has been transferred here for monitoring with long-term EEG.  Current AEDs: LEV, VPA, Clonazepam, Propofol, Ketamine (24hr total)  Subjective: RN and CCM at the bedside. LTM in place Ketamine until 1235 today   Exam: Vitals:   10/14/22 0700 10/14/22 0727  BP: 134/84   Pulse: (!) 103 (!) 102  Resp: 14 14  Temp:    SpO2: 100% 100%   Gen: Intubated patient in no acute distress Resp: Respirations synchronous with ventilator  Neuro: Mental Status: Does not respond to voice, no response to noxious stimuli, unable to follow commands Cranial Nerves: Pupils midline 3 mm and reactive to light, no oculocephalic reflex, does not focus or track examiner, positive corneal reflex face appears symmetrical with ET tube Motor: No spontaneous movement or movement to noxious stimuli Sensory: No response to noxious stimuli Gait: Deferred  Pertinent Labs:    Latest Ref Rng & Units 10/14/2022    5:00 AM 10/12/2022    6:15 AM 10/11/2022    5:03 AM  CBC  WBC 4.0 - 10.5 K/uL 25.6  21.4  18.9   Hemoglobin 13.0 - 17.0 g/dL 9.5  9.2  9.0   Hematocrit 39.0 - 52.0 % 28.0  28.2  27.7   Platelets 150 - 400 K/uL 462  430  384        Latest Ref Rng & Units 10/14/2022    5:00 AM 10/13/2022    6:10 AM 10/12/2022    6:15 AM  BMP  Glucose 70 - 99 mg/dL 841  324  401   BUN  6 - 20 mg/dL 29  28  30    Creatinine 0.61 - 1.24 mg/dL  0.27  2.53   Sodium 135 - 145 mmol/L 135  136  136   Potassium 3.5 - 5.1 mmol/L 4.7  5.1  4.9   Chloride 98 - 111 mmol/L 98  98  101   CO2 22 - 32 mmol/L 30  29  30    Calcium 8.9 - 10.3 mg/dL 8.4  8.5  8.2      Imaging Reviewed:  MRI brain: Fairly symmetric DWI and T2 flair intense signal abnormality in bilateral basal ganglia, compatible with acute hypoxic/ischemic injury  LTM 10/11/2022 1625 to 10/12/2022 0930 - This study showed evidence of generalized epileptogenicity with high potential for seizures as well as profound diffuse encephalopathy. In the setting of cardiac arrest, this EEG pattern is suggestive of anoxic/hypoxic brain injury. Event button was pressed on 10/11/2022 at 1951 and on 10/12/2022 at 0153 for right arm twitching ( not visible on camera). Concomitant EEG showed generalized epileptiform discharges at 3-4hz . These episodes could be myoclonic seizures but difficult to confirm as events are not visible on camera.   LTM 10/13/2022 1625 to 10/14/2022 0754 AEDs: LEV, VPA, Clonazepam, Propofol, Ketamine   -This study showed evidence of generalized epileptogenicity with  high potential for seizures as well as profound diffuse encephalopathy. In the setting of cardiac arrest, this EEG pattern is suggestive of anoxic/hypoxic brain injury.    Assessment: 61 year old patient with history of hypertension and hyperlipidemia initially admitted with influenza underwent hypoxic respiratory failure as well as PEA arrest. Cardiac arrest occurred on 12/19 at 2345, he continues to be unresponsive to noxious stimuli with GPD's seen on EEG.  He has had twitching movements of the hands, and propofol Depakote and Keppra have been given to suppress seizure activity.  Goals of care discussion with family is ongoing, but they wish for him to remain full code with aggressive interventions at this time.  LTM shows concern for generalized  epileptogenicity and he is floridly myoclonic on exam. Propofol has been increased and PRN versed is available as well.   Likelihood of neurologically meaningful recovery remains grim this far out from cardiac arrest without any indication of meaningful higher cortical function-this was discussed in detail with the wife by the attending on the phone call 3 days ago but she wished full scope of care for which transfer was recommended.    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 Will dc today  Will discuss with wife the futility of these treatments as there is negligible chance of meaningful recovery   Discussed wit Dr. Kendrick Fries  Patient seen and examined by NP/APP with MD. MD to update note as needed.   Elmer Picker, DNP, FNP-BC [facility] Pager: [phone]  Attending Neurohospitalist Addendum Patient seen and examined with APP/Resident. Agree with the history and physical as documented above. Agree with the plan as documented, which I helped formulate. I have independently reviewed the chart, obtained history, review of systems and examined the patient.I have personally reviewed pertinent head/neck/spine imaging (CT/MRI). Please feel free to call with any questions.  -- Milon Dikes, MD Neurologist [facility] Pager: [phone]  CRITICAL CARE ATTESTATION Performed by: Milon Dikes, MD Total critical care time: 30 minutes Critical care time was exclusive of separately billable procedures and treating other patients and/or supervising APPs/Residents/Students Critical care was necessary to treat or prevent imminent or life-threatening deterioration. This patient is  critically ill and at significant risk for neurological worsening and/or death and care requires constant monitoring. Critical care was time spent personally by me on the following activities: development of treatment plan with patient and/or surrogate as well as nursing, discussions with consultants, evaluation of patient's response to treatment, examination of patient, obtaining history from patient or surrogate, ordering and performing treatments and interventions, ordering and review of laboratory studies, ordering and review of radiographic studies, pulse oximetry, re-evaluation of patient's condition, participation in multidisciplinary rounds and medical decision making of high complexity in the care of this patient.

## 2022-10-14 NOTE — IPAL (Signed)
Family meeting  This evening, I met with Mrs. Bruster as well as the patient's daughter, son-in-law and 2 of Mrs. Pote siblings in the room.  I discussed with them the clinical picture, history, examination, EEG and our clinical approach till now.  I explained to them that at this point, I do not think he has any chance of neurologically meaningful recovery and has sustained irreversible anoxic brain injury that is catastrophic.  I have encouraged him to get all family members to come see their goodbyes and then proceed towards comfort measures only.  I do not have any further input from a neurological standpoint.  I am signing off service to my colleague, who I will inform about this case and who shall be available in case questions arise but there is no further recommendations from a neurological standpoint at this time.  Plan was discussed in detail with the family.  Please call with questions as needed.  -- Amie Portland, MD Neurologist [facility] Pager: [phone]  Additional 20 minutes of critical care time

## 2022-10-14 NOTE — Plan of Care (Signed)
Spoke with wife. She is coming after 1600 hrs. Will speak in person to discuss GOC  -- Milon Dikes, MD Neurologist [facility] Pager: [phone]

## 2022-10-14 NOTE — Plan of Care (Signed)
  Problem: Fluid Volume: Goal: Hemodynamic stability will improve Outcome: Progressing   Problem: Respiratory: Goal: Ability to maintain adequate ventilation will improve Outcome: Progressing   Problem: Activity: Goal: Ability to tolerate increased activity will improve Outcome: Progressing   Problem: Respiratory: Goal: Ability to maintain a clear airway and adequate ventilation will improve Outcome: Progressing   Problem: Fluid Volume: Goal: Ability to maintain a balanced intake and output will improve Outcome: Progressing   Problem: Metabolic: Goal: Ability to maintain appropriate glucose levels will improve Outcome: Progressing   Problem: Nutritional: Goal: Maintenance of adequate nutrition will improve Outcome: Progressing Goal: Progress toward achieving an optimal weight will improve Outcome: Progressing   Problem: Tissue Perfusion: Goal: Adequacy of tissue perfusion will improve Outcome: Progressing   Problem: Clinical Measurements: Goal: Respiratory complications will improve Outcome: Progressing Goal: Cardiovascular complication will be avoided Outcome: Progressing   Problem: Activity: Goal: Risk for activity intolerance will decrease Outcome: Progressing   Problem: Nutrition: Goal: Adequate nutrition will be maintained Outcome: Progressing   Problem: Coping: Goal: Level of anxiety will decrease Outcome: Progressing   Problem: Elimination: Goal: Will not experience complications related to urinary retention Outcome: Progressing   Problem: Pain Managment: Goal: General experience of comfort will improve Outcome: Progressing   Problem: Safety: Goal: Ability to remain free from injury will improve Outcome: Progressing   Problem: Clinical Measurements: Goal: Diagnostic test results will improve Outcome: Not Progressing Goal: Signs and symptoms of infection will decrease Outcome: Not Progressing   Problem: Role Relationship: Goal: Method of  communication will improve Outcome: Not Progressing   Problem: Education: Goal: Ability to describe self-care measures that may prevent or decrease complications (Diabetes Survival Skills Education) will improve Outcome: Not Progressing Goal: Individualized Educational Video(s) Outcome: Not Progressing   Problem: Coping: Goal: Ability to adjust to condition or change in health will improve Outcome: Not Progressing   Problem: Health Behavior/Discharge Planning: Goal: Ability to identify and utilize available resources and services will improve Outcome: Not Progressing Goal: Ability to manage health-related needs will improve Outcome: Not Progressing   Problem: Skin Integrity: Goal: Risk for impaired skin integrity will decrease Outcome: Not Progressing   Problem: Education: Goal: Knowledge of General Education information will improve Description: Including pain rating scale, medication(s)/side effects and non-pharmacologic comfort measures Outcome: Not Progressing   Problem: Health Behavior/Discharge Planning: Goal: Ability to manage health-related needs will improve Outcome: Not Progressing   Problem: Clinical Measurements: Goal: Ability to maintain clinical measurements within normal limits will improve Outcome: Not Progressing Goal: Will remain free from infection Outcome: Not Progressing Goal: Diagnostic test results will improve Outcome: Not Progressing   Problem: Elimination: Goal: Will not experience complications related to bowel motility Outcome: Not Progressing   Problem: Skin Integrity: Goal: Risk for impaired skin integrity will decrease Outcome: Not Progressing

## 2022-10-14 NOTE — Progress Notes (Signed)
NAME:  Damon [name], MRN:  [id number], DOB:  12/21/1960, LOS: 11 ADMISSION DATE:  10/03/2022, CONSULTATION DATE:  12/20 REFERRING MD:  Ronaldo Miyamoto, CHIEF COMPLAINT:  Dyspnea   History of Present Illness:  61 y/o male admitted to [facility] on 12/20 in setting of acute hypoxemic respiratory failure due to Flu A. Placed on BIPAP.  Had a 5 minute PEA arrest, required intubation.  After several days on mechanical ventilation remains comatose, MRI brain findings worrisome for anoxic brain injury.  Moved to [facility] for LTM EEG monitoring.  Pertinent  Medical History   Hypertension Hyperlipidemia Tobacco abuse   Significant Hospital Events: Including procedures, antibiotic start and stop dates in addition to other pertinent events   12/18 Flu A positive 12/20 Admitted, 5 minute code after non-compliance with BiPAP 12/21 Intubated, paralyzed, ARDS protocol, on 7cc/kg 12/22 Weaned to 6 cc/kg with dyssynchrony, back on 7 cc/kg, driving pressures good, weaning vent 12/23 Attempt to wean sedation again with dyssynchrony and desaturation.  Some concern for cuff leak, tube exchange.  Cuff did not appear to be blown.  Ongoing signs of cuff leak, query leaking around cough, possible large trachea, diuresing 12/25 tolerating PSV, myoclonus> ceribell, Neuro consult, changed to propofol, diuresed  12/26 Changed to DNR. On Propofol, versed.  Tolerating PSV.  MRI brain with hypoxic/anoxic injury 12/27 GPD's on EEG, pending transfer to [facility] for cEEG 12/28 moved to [facility] for LTM EEG  Interim History / Subjective:   Low grade fever WBC up  Objective   Blood pressure 132/85, pulse (!) 102, temperature 99 F (37.2 C), temperature source Oral, resp. rate 14, height 5\' 4"  (1.626 m), weight 62.2 kg, SpO2 100 %.    Vent Mode: PRVC FiO2 (%):  [40 %] 40 % Set Rate:  [15 bmp] 15 bmp Vt Set:  [550 mL] 550 mL PEEP:  [8 cmH20] 8 cmH20 Plateau Pressure:  [18 cmH20-21 cmH20] 21 cmH20   Intake/Output Summary (Last 24 hours) at  10/14/2022 10/16/2022 Last data filed at 10/14/2022 10/16/2022 Gross per 24 hour  Intake 2823.99 ml  Output 4350 ml  Net -1526.01 ml   Filed Weights   10/11/22 1510 10/12/22 0600 10/13/22 0400  Weight: 60.1 kg 64.7 kg 62.2 kg    Examination:  General:  In bed on vent HENT: NCAT ETT in place PULM: CTA B, vent supported breathing CV: RRR, no mgr GI: BS+, soft, nontender MSK: normal bulk and tone Neuro: GCS 3  12/29 CXR multi-focal infiltrates  Resolved Hospital Problem list   AKI Hypernatremia  Assessment & Plan:  ARDS due to Flu/strep pneumo pneumonia Anoxic brain injury Cardiac arrest Severe protein calorie malnutrition Hypertension Normocytic Anemia without bleeding Fever, HCAP  Discussion: Remains comatose, picture worrisome for severe anoxic brain injury.   Plan: Full mechanical vent support VAP prevention Daily WUA/SBT Resp culture Zosyn today Monitor for bleeding Transfuse PRBC for Hgb < 7 gm/dL Ketamine, propofol, keppra, valproate per neuro Clonazepam, oxycodone to continue Monitor LTM EEG again today GOC conversations on going with wife, planning to meet again tomorrow Maintain CVL for now   Best Practice (right click and "Reselect all SmartList Selections" daily)   Diet/type: tubefeeds DVT prophylaxis: LMWH GI prophylaxis: PPI Lines: CVL, maintain Foley:  Yes, and it is still needed Code Status:  DNR Last date of multidisciplinary goals of care discussion [12/26, planning for tomorrow]  Critical care time: 32 minutes    10-22-1988, MD [place] Pager: [phone] Cell: [phone] After 7:00 pm call Elink  (  [phone]   

## 2022-10-14 NOTE — Progress Notes (Signed)
25cc of Ketamine wasted in the med room with Francesca Jewett, RN. Ellanora Rayborn, [place]rape

## 2022-10-14 NOTE — Progress Notes (Signed)
   Palliative Medicine Inpatient Follow Up Note   HPI: 61 y/o male admitted to [facility] on 12/20 in setting of acute hypoxemic respiratory failure due to Flu A. Placed on BIPAP. Had a 5 minute PEA arrest, required intubation. After several days on mechanical ventilation remains comatose, MRI brain findings worrisome for anoxic brain injury. Moved to [facility] for LTM EEG monitoring.    Palliative care has been asked to get involved for further goals of care conversations in the setting of anoxic brain injury post arrest.  Today's Discussion 10/14/2022  *Please note that this is a verbal dictation therefore any spelling or grammatical errors are due to the "[place] [employer]" system interpretation.  Chart reviewed inclusive of vital signs, progress notes, laboratory results, and diagnostic images.   I spoke to patients RN regarding the context of conversations from the prior day.  I met with Damon [name] at bedside this morning. He remains unresponsive. No nonverbal s/s of distress are noted.  I called and spoke with patients spouse, Damon [name] this afternoon. I shared that [name] has not shown any improvement per review of neurology notes. We reviewed his poor prognosis and the importance of decision making moving forward.   Damon [name] feels that she will allow family time to visit. She is willing and able to meet with the PMT tomorrow at 11am.   Questions and concerns addressed/Palliative Support Provided.   Objective Assessment: Vital Signs Vitals:   10/14/22 1200 10/14/22 1300  BP: 124/83 113/80  Pulse: (!) 103 (!) 103  Resp: 16 17  Temp: 99 F (37.2 C)   SpO2: 100% 100%    Intake/Output Summary (Last 24 hours) at 10/14/2022 1318 Last data filed at 10/14/2022 1300 Gross per 24 hour  Intake 2686.27 ml  Output 4025 ml  Net -1338.73 ml   Last Weight  Most recent update: 10/13/2022  4:19 AM    Weight  62.2 kg (137 lb 2 oz)            Gen: Acutely ill-appearing African-American male  intubated HEENT: ETT, OGT, dry mucous membranes CV: Regular rate and rhythm PULM: Mechanical ventilation ABD: soft/nontender EXT: No edema Neuro: Somnolent not arousable   SUMMARY OF RECOMMENDATIONS   DNAR --> patient's wife would accept if patients heart were to suddenly stop and would not want additional resuscitative efforts made   Open and honest conversations were held in the setting of Damon [name]'s anoxic brain injury and the possible decisions we are encroaching upon   Damon [name] is agreeable to meeting with the PMT tomorrow at 11am   Ongoing palliative support  Billing based on MDM: High ______________________________________________________________________________________ [place] Team Team Cell Phone: [phone] Please utilize secure chat with additional questions, if there is no response within 30 minutes please call the above phone number  Palliative Medicine Team providers are available by phone from 7am to 7pm daily and can be reached through the team cell phone.  Should this patient require assistance outside of these hours, please call the patient's attending physician.

## 2022-10-14 NOTE — Plan of Care (Signed)
  Problem: Fluid Volume: Goal: Hemodynamic stability will improve Outcome: Progressing   Problem: Respiratory: Goal: Ability to maintain adequate ventilation will improve Outcome: Progressing   Problem: Activity: Goal: Ability to tolerate increased activity will improve Outcome: Progressing   Problem: Respiratory: Goal: Ability to maintain a clear airway and adequate ventilation will improve Outcome: Progressing   Problem: Fluid Volume: Goal: Ability to maintain a balanced intake and output will improve Outcome: Progressing   Problem: Metabolic: Goal: Ability to maintain appropriate glucose levels will improve Outcome: Progressing   Problem: Nutritional: Goal: Maintenance of adequate nutrition will improve Outcome: Progressing Goal: Progress toward achieving an optimal weight will improve Outcome: Progressing   Problem: Tissue Perfusion: Goal: Adequacy of tissue perfusion will improve Outcome: Progressing   Problem: Clinical Measurements: Goal: Cardiovascular complication will be avoided Outcome: Progressing   Problem: Activity: Goal: Risk for activity intolerance will decrease Outcome: Progressing   Problem: Nutrition: Goal: Adequate nutrition will be maintained Outcome: Progressing   Problem: Coping: Goal: Level of anxiety will decrease Outcome: Progressing   Problem: Elimination: Goal: Will not experience complications related to urinary retention Outcome: Progressing   Problem: Pain Managment: Goal: General experience of comfort will improve Outcome: Progressing   Problem: Safety: Goal: Ability to remain free from injury will improve Outcome: Progressing   Problem: Clinical Measurements: Goal: Diagnostic test results will improve Outcome: Not Progressing Goal: Signs and symptoms of infection will decrease Outcome: Not Progressing   Problem: Role Relationship: Goal: Method of communication will improve Outcome: Not Progressing   Problem:  Education: Goal: Ability to describe self-care measures that may prevent or decrease complications (Diabetes Survival Skills Education) will improve Outcome: Not Progressing Goal: Individualized Educational Video(s) Outcome: Not Progressing   Problem: Coping: Goal: Ability to adjust to condition or change in health will improve Outcome: Not Progressing   Problem: Health Behavior/Discharge Planning: Goal: Ability to identify and utilize available resources and services will improve Outcome: Not Progressing Goal: Ability to manage health-related needs will improve Outcome: Not Progressing   Problem: Skin Integrity: Goal: Risk for impaired skin integrity will decrease Outcome: Not Progressing   Problem: Education: Goal: Knowledge of General Education information will improve Description: Including pain rating scale, medication(s)/side effects and non-pharmacologic comfort measures Outcome: Not Progressing   Problem: Health Behavior/Discharge Planning: Goal: Ability to manage health-related needs will improve Outcome: Not Progressing   Problem: Clinical Measurements: Goal: Ability to maintain clinical measurements within normal limits will improve Outcome: Not Progressing Goal: Will remain free from infection Outcome: Not Progressing Goal: Diagnostic test results will improve Outcome: Not Progressing Goal: Respiratory complications will improve Outcome: Not Progressing   Problem: Elimination: Goal: Will not experience complications related to bowel motility Outcome: Not Progressing   Problem: Skin Integrity: Goal: Risk for impaired skin integrity will decrease Outcome: Not Progressing

## 2022-10-14 NOTE — Progress Notes (Signed)
Pharmacy Antibiotic Note  Damon [name] is a 61 y.o. male admitted on 10/03/2022 with pneumonia.  Pharmacy has been consulted for Zosyn dosing. Zosyn preferred to cefepime given epileptic activity with anoxic brain injury and continuous EEG monitoring.   WBC trending up to 25.6. Tmax 99.9 HR 100s, intubated on ventilator at 40% FiO2 Tracheal aspirate culture collected   Plan: Zosyn 3.375g IV q8h (4 hour infusion). Monitor daily CBC, temp, SCr, and for clinical signs of improvement  F/u trach aspirate culture and de-escalate antibiotics as able   Height: 5\' 4"  (162.6 cm) Weight: 62.2 kg (137 lb 2 oz) IBW/kg (Calculated) : 59.2  Temp (24hrs), Avg:99.1 F (37.3 C), Min:98.2 F (36.8 C), Max:99.9 F (37.7 C)  Recent Labs  Lab 10/09/22 0500 10/10/22 0414 10/11/22 0503 10/12/22 0615 10/13/22 0610 10/14/22 0500  WBC 20.3* 22.5* 18.9* 21.4*  --  25.6*  CREATININE 0.97 0.83 0.80 0.80 0.73 0.78    Estimated Creatinine Clearance: 82.2 mL/min (by C-G formula based on SCr of 0.78 mg/dL).    No Known Allergies  Antimicrobials this admission: Azithromycin 12/20 x1 Metronidazole 12/20 >> 12/21 Ceftriaxone 12/20 >> 12/26 Zosyn 12/20 x1,  12/31 >>  Oseltamivir 12/21 >> 12/25  Dose adjustments this admission: N/A  Microbiology results: 12/20:  1/4 BCx + GPC: BCID + S.lugdunensis & S. Epi (pans sens) FINAL - CCM believes these are contaminants 12/18 Flu A + 12/20 UCx: NG (final) 12/20 Strep UAg + 12/20: Trach aspirate: normal flora F 12/20: MRSA PCR: neg 12/26 bcx x2: NG (final) 12/31 Trach aspirate: collected  Thank you for allowing pharmacy to be a part of this patient's care.  1/32, PharmD, BCPS Clinical Pharmacist 10/14/2022 10:13 AM   Please refer to AMION for pharmacy phone number

## 2022-10-15 DIAGNOSIS — I468 Cardiac arrest due to other underlying condition: Secondary | ICD-10-CM

## 2022-10-15 DIAGNOSIS — J9601 Acute respiratory failure with hypoxia: Secondary | ICD-10-CM | POA: Diagnosis not present

## 2022-10-15 DIAGNOSIS — J101 Influenza due to other identified influenza virus with other respiratory manifestations: Secondary | ICD-10-CM

## 2022-10-15 DIAGNOSIS — Z7189 Other specified counseling: Secondary | ICD-10-CM

## 2022-10-15 DIAGNOSIS — G931 Anoxic brain damage, not elsewhere classified: Secondary | ICD-10-CM

## 2022-10-15 DIAGNOSIS — J989 Respiratory disorder, unspecified: Secondary | ICD-10-CM | POA: Diagnosis not present

## 2022-10-15 DIAGNOSIS — R4182 Altered mental status, unspecified: Secondary | ICD-10-CM

## 2022-10-15 LAB — TRIGLYCERIDES: Triglycerides: 192 mg/dL — ABNORMAL HIGH (ref <150)

## 2022-10-15 LAB — GLUCOSE, CAPILLARY
Glucose-Capillary: 112 mg/dL — ABNORMAL HIGH (ref 70–99)
Glucose-Capillary: 119 mg/dL — ABNORMAL HIGH (ref 70–99)
Glucose-Capillary: 124 mg/dL — ABNORMAL HIGH (ref 70–99)
Glucose-Capillary: 128 mg/dL — ABNORMAL HIGH (ref 70–99)
Glucose-Capillary: 130 mg/dL — ABNORMAL HIGH (ref 70–99)
Glucose-Capillary: 98 mg/dL (ref 70–99)

## 2022-10-15 NOTE — Progress Notes (Signed)
Daily Progress Note   Patient Name: Damon [name]       Date: 10/15/2022 DOB: 1961-10-09  Age: 62 y.o. MRN#: [id number] Attending Physician: Juanito Doom, MD Primary Care Physician: [facility] Admit Date: 10/03/2022  Reason for Consultation/Follow-up: Establishing goals of care  Patient Profile/HPI:  62 y/o male admitted to [facility] on 12/20 in setting of acute hypoxemic respiratory failure due to Flu A. Placed on BIPAP. Had a 5 minute PEA arrest, required intubation. After several days on mechanical ventilation remains comatose, MRI brain findings worrisome for anoxic brain injury. Moved to [facility] for LTM EEG monitoring.  Palliative care has been asked to get involved for further goals of care conversations in the setting of anoxic brain injury post arrest. 12/31- no improvements, prognosis has been discussed extensively with family by palliative team, neurology and PCCM providers.   Subjective: Chart reviewed including labs, progress notes, imaging from this and previous encounters.  Meeting was planned for 11am this morning with spouse, but she did not arrive.  I called and spoke with spouse- she will be in the hospital later this afternoon/evening but unsure of a specific time. Palliative family meeting rescheduled to tomorrow at 11am.  She asked for update on patient and I shared with her unfortunately there are no changes in status since her discussion with neurology last evening. Recommendations continue to be for transition to comfort measures.   Review of Systems  Unable to perform ROS: Intubated     Physical Exam Vitals and nursing note reviewed.  Cardiovascular:     Rate and Rhythm: Normal rate.  Pulmonary:     Comments: intubated            Vital Signs: BP  103/66   Pulse (!) 103   Temp 99.2 F (37.3 C) (Axillary)   Resp (!) 24   Ht 5\' 4"  (1.626 m)   Wt 59 kg   SpO2 94%   BMI 22.33 kg/m  SpO2: SpO2: 94 % O2 Device: O2 Device: Ventilator O2 Flow Rate:    Intake/output summary:  Intake/Output Summary (Last 24 hours) at 10/15/2022 1148 Last data filed at 10/15/2022 1000 Gross per 24 hour  Intake 2259.23 ml  Output 3785 ml  Net -1525.77 ml   LBM: Last BM Date : 10/11/22 Baseline Weight: Weight: 64.9 kg  Most recent weight: Weight: 59 kg       Palliative Assessment/Data: PPS: 10%      Patient Active Problem List   Diagnosis Date Noted   Acute respiratory failure with hypoxia ([place]) 10/03/2022   CAP (community acquired pneumonia) 10/03/2022   Influenza and pneumonia 10/03/2022   Elevated LFTs 10/03/2022   HTN (hypertension) 10/03/2022   HLD (hyperlipidemia) 10/03/2022   Tobacco abuse 10/03/2022   SIRS (systemic inflammatory response syndrome) ([place]) 10/03/2022   Influenza A 10/03/2022   Cardiac arrest due to respiratory disorder ([place]) 10/03/2022   Tobacco dependence 10/03/2022    Palliative Care Assessment & Plan    Assessment/Recommendations/Plan  Continue current plan PMT family meeting tomorrow at 11am- continue to recommend transition to comfort   Code Status: DNR  Prognosis:  Unable to determine  Discharge Planning: To Be Determined  Care plan was discussed with patient's spouse and care team.   Thank you for allowing the Palliative Medicine Team to assist in the care of this patient.  Greater than 50%  of this time was spent counseling and coordinating care related to the above assessment and plan.  [name], AGNP-C Palliative Medicine   Please contact Palliative Medicine Team phone at [phone] for questions and concerns.

## 2022-10-15 NOTE — Progress Notes (Signed)
NAME:  Damon [name], MRN:  [id number], DOB:  06-21-1961, LOS: 12 ADMISSION DATE:  10/03/2022, CONSULTATION DATE:  12/20 REFERRING MD:  Marylyn Ishihara, CHIEF COMPLAINT:  Dyspnea   History of Present Illness:  62 y/o male admitted to [facility] on 12/20 in setting of acute hypoxemic respiratory failure due to Flu A. Placed on BIPAP.  Had a 5 minute PEA arrest, required intubation.  After several days on mechanical ventilation remains comatose, MRI brain findings worrisome for anoxic brain injury.  Moved to [facility] for LTM EEG monitoring.  Pertinent  Medical History   Hypertension Hyperlipidemia Tobacco abuse   Significant Hospital Events: Including procedures, antibiotic start and stop dates in addition to other pertinent events   12/18 Flu A positive 12/20 Admitted, 5 minute code after non-compliance with BiPAP 12/21 Intubated, paralyzed, ARDS protocol, on 7cc/kg 12/22 Weaned to 6 cc/kg with dyssynchrony, back on 7 cc/kg, driving pressures good, weaning vent 12/23 Attempt to wean sedation again with dyssynchrony and desaturation.  Some concern for cuff leak, tube exchange.  Cuff did not appear to be blown.  Ongoing signs of cuff leak, query leaking around cough, possible large trachea, diuresing 12/25 tolerating PSV, myoclonus> ceribell, Neuro consult, changed to propofol, diuresed  12/26 Changed to DNR. On Propofol, versed.  Tolerating PSV.  MRI brain with hypoxic/anoxic injury 12/27 GPD's on EEG, pending transfer to [facility] for cEEG 12/28 moved to [facility] for LTM EEG 12/31 family updated by neuro: no chance for meaningful recovery, they should make plans for comfort measures  Interim History / Subjective:   Neuro: no chance for meaningful recovery, should proceed with comfort measures Family planning to visit today  Objective   Blood pressure 111/75, pulse (!) 103, temperature 99.2 F (37.3 C), temperature source Axillary, resp. rate 19, height 5\' 4"  (1.626 m), weight 59 kg, SpO2 98 %.    Vent Mode:  PRVC FiO2 (%):  [30 %] 30 % Set Rate:  [15 bmp] 15 bmp Vt Set:  [550 mL] 550 mL PEEP:  [8 cmH20] 8 cmH20 Plateau Pressure:  [16 cmH20-17 cmH20] 17 cmH20   Intake/Output Summary (Last 24 hours) at 10/15/2022 0957 Last data filed at 10/15/2022 0900 Gross per 24 hour  Intake 2145.5 ml  Output 3875 ml  Net -1729.5 ml   Filed Weights   10/12/22 0600 10/13/22 0400 10/15/22 0500  Weight: 64.7 kg 62.2 kg 59 kg    Examination:  General:  In bed on vent HENT: NCAT ETT in place PULM: CTA B, vent supported breathing CV: RRR, no mgr GI: BS+, soft, nontender MSK: normal bulk and tone Neuro: GCS 3   12/29 CXR multi-focal infiltrates  Resolved Hospital Problem list   AKI Hypernatremia  Assessment & Plan:  ARDS due to Flu/strep pneumo pneumonia Anoxic brain injury Cardiac arrest Severe protein calorie malnutrition Hypertension Normocytic Anemia without bleeding Fever, HCAP  Discussion: Remains comatose, picture worrisome for severe anoxic brain injury.   Plan: At this point focus needs to be on comfort measures as there is no meaningful chance of a neurologic or functional recovery.  Family was informed on 12/31, awaiting final decision from their standpoint. Full mechanical vent support VAP prevention Daily WUA/SBT Resp culture Continue zosyn for now Monitor for bleeding Transfuse PRBC for Hgb < 7 gm/dL Monitor BMET and UOP Replace electrolytes as needed Stop TTM Propofol per neuro   Best Practice (right click and "Reselect all SmartList Selections" daily)   Diet/type: tubefeeds DVT prophylaxis: LMWH GI prophylaxis: PPI Lines: CVL, maintain  Foley:  Yes, and it is still needed Code Status:  DNR Last date of multidisciplinary goals of care discussion [12/31]  Critical care time: 32 minutes    Roselie Awkward, MD [place] [facility] Pager: [phone] Cell: [phone] After 7:00 pm call Elink  [phone]

## 2022-10-15 NOTE — Procedures (Signed)
Patient Name: Damon [name]  MRN: [id number]  Epilepsy Attending: Lora Havens  Referring Physician/Provider: Amie Portland, MD  Duration: 10/14/2022 1625 to 10/15/2022 1625   Patient history: 62yo M s/p cardiac arrest. EEG to evaluate for seizure   Level of alertness:  comatose   AEDs during EEG study: LEV, VPA, Clonazepam, Propofol    Technical aspects: This EEG study was done with scalp electrodes positioned according to the 10-20 International system of electrode placement. Electrical activity was reviewed with band pass filter of 1-70Hz , sensitivity of 7 uV/mm, display speed of 66mm/sec with a 60Hz  notched filter applied as appropriate. EEG data were recorded continuously and digitally stored.  Video monitoring was available and reviewed as appropriate.   Description: EEG predominantly showed burst suppression with near continuous generalized highly epileptiform bursts and brief 0.5-1 second of generalized suppression. EEG was not reactive to tactile stimulation. Hyperventilation and photic stimulation were not performed.      IMPRESSION: - Burst suppression with highly epileptiform bursts, generalized   IMPRESSION: This study showed evidence of generalized epileptogenicity with high potential for seizures as well as profound diffuse encephalopathy. In the setting of cardiac arrest, this EEG pattern is suggestive of anoxic/hypoxic brain injury.    Elisabetta Mishra Barbra Sarks

## 2022-10-15 NOTE — Plan of Care (Signed)
Discussed with CCM team, neurology will be available as needed, LTM continued per family preference at this time, CCM to place DC order when appropriate  Lesleigh Noe MD-PhD [facility] [phone]  Available 7 AM to 7 PM, outside these hours please contact Neurologist on call listed on AMION

## 2022-10-16 DIAGNOSIS — G931 Anoxic brain damage, not elsewhere classified: Secondary | ICD-10-CM | POA: Diagnosis not present

## 2022-10-16 DIAGNOSIS — L8990 Pressure ulcer of unspecified site, unspecified stage: Secondary | ICD-10-CM | POA: Insufficient documentation

## 2022-10-16 DIAGNOSIS — R569 Unspecified convulsions: Secondary | ICD-10-CM | POA: Diagnosis not present

## 2022-10-16 DIAGNOSIS — I469 Cardiac arrest, cause unspecified: Secondary | ICD-10-CM | POA: Diagnosis not present

## 2022-10-16 DIAGNOSIS — J101 Influenza due to other identified influenza virus with other respiratory manifestations: Secondary | ICD-10-CM | POA: Diagnosis not present

## 2022-10-16 DIAGNOSIS — R4182 Altered mental status, unspecified: Secondary | ICD-10-CM | POA: Diagnosis not present

## 2022-10-16 DIAGNOSIS — J9601 Acute respiratory failure with hypoxia: Secondary | ICD-10-CM | POA: Diagnosis not present

## 2022-10-16 DIAGNOSIS — Z7189 Other specified counseling: Secondary | ICD-10-CM | POA: Diagnosis not present

## 2022-10-16 LAB — GLUCOSE, CAPILLARY
Glucose-Capillary: 117 mg/dL — ABNORMAL HIGH (ref 70–99)
Glucose-Capillary: 118 mg/dL — ABNORMAL HIGH (ref 70–99)
Glucose-Capillary: 119 mg/dL — ABNORMAL HIGH (ref 70–99)
Glucose-Capillary: 127 mg/dL — ABNORMAL HIGH (ref 70–99)
Glucose-Capillary: 132 mg/dL — ABNORMAL HIGH (ref 70–99)
Glucose-Capillary: 135 mg/dL — ABNORMAL HIGH (ref 70–99)

## 2022-10-16 NOTE — Progress Notes (Addendum)
NAME:  Damon [name], MRN:  [id number], DOB:  April 11, 1961, LOS: 73 ADMISSION DATE:  10/03/2022, CONSULTATION DATE:  12/20 REFERRING MD:  Marylyn Ishihara, CHIEF COMPLAINT:  Dyspnea   History of Present Illness:  62 y/o male admitted to [facility] on 12/20 in setting of acute hypoxemic respiratory failure due to Flu A. Placed on BIPAP.  Had a 5 minute PEA arrest, required intubation.  After several days on mechanical ventilation remains comatose, MRI brain findings worrisome for anoxic brain injury.  Moved to [facility] for LTM EEG monitoring.  Pertinent  Medical History   Hypertension Hyperlipidemia Tobacco abuse   Significant Hospital Events: Including procedures, antibiotic start and stop dates in addition to other pertinent events   12/18 Flu A positive 12/20 Admitted, 5 minute code after non-compliance with BiPAP 12/21 Intubated, paralyzed, ARDS protocol, on 7cc/kg 12/22 Weaned to 6 cc/kg with dyssynchrony, back on 7 cc/kg, driving pressures good, weaning vent 12/23 Attempt to wean sedation again with dyssynchrony and desaturation.  Some concern for cuff leak, tube exchange.  Cuff did not appear to be blown.  Ongoing signs of cuff leak, query leaking around cough, possible large trachea, diuresing 12/25 tolerating PSV, myoclonus> ceribell, Neuro consult, changed to propofol, diuresed  12/26 Changed to DNR. On Propofol, versed.  Tolerating PSV.  MRI brain with hypoxic/anoxic injury 12/27 GPD's on EEG, pending transfer to [facility] for cEEG 12/28 moved to [facility] for LTM EEG 12/31 family updated by neuro: no chance for meaningful recovery, they should make plans for comfort measures 1/2 eeg still with epileptogenicity   Interim History / Subjective:   NAEO  EEG w epileptogenicity on 80prop + clonazepam lev vpa   Objective   Blood pressure 122/82, pulse (!) 109, temperature (!) 97.5 F (36.4 C), temperature source Oral, resp. rate (!) 21, height 5\' 4"  (1.626 m), weight 59 kg, SpO2 99 %.    Vent Mode: PRVC FiO2  (%):  [30 %] 30 % Set Rate:  [15 bmp] 15 bmp Vt Set:  [550 mL] 550 mL PEEP:  [5 cmH20] 5 cmH20 Pressure Support:  [5 cmH20] 5 cmH20 Plateau Pressure:  [15 cmH20] 15 cmH20   Intake/Output Summary (Last 24 hours) at 10/16/2022 1050 Last data filed at 10/16/2022 1000 Gross per 24 hour  Intake 2331.92 ml  Output 1815 ml  Net 516.92 ml   Filed Weights   10/12/22 0600 10/13/22 0400 10/15/22 0500  Weight: 64.7 kg 62.2 kg 59 kg    Examination:  General:  critically ill M intubated sedated HENT: ETT secure  PULM: ctab even unlabored on MV CV: rr s1s2 no rgm  GI: soft ndnt  MSK: no acute joint deformity  Neuro: sedate   Resolved Hospital Problem list   AKI Hypernatremia  Assessment & Plan:   Flu/strep pneumo PNA. Improving ARDS  Anoxic brain injury // anoxic encephalopathy  Seizure  Cardiac arrest HTN  Anemia Severe protein calorie malnutrition  DNR status  Discussion: Remains comatose. Severe anoxic injury  At this point focus needs to be on comfort measures as there is no meaningful chance of a neurologic or functional recovery.   Family was informed on 12/31, awaiting final decision from their standpoint.  Plan Cont MV support, wean as able VAP, pulm hygiene  Droplet precautions  Follow resp cx  Cont zosyn  Resp culture cEEG p[er neuro AEDs per neuro -- VPA LEV clonazepam prop PRN versed  GOC ongoing, palliative consulted -- possible fam mtg 1/2 at 11am PRN bmp, cbc  Best Practice (right click and "Reselect all SmartList Selections" daily)   Diet/type: tubefeeds DVT prophylaxis: LMWH GI prophylaxis: PPI Lines: CVL, maintain Foley:  Yes, and it is still needed Code Status:  DNR Last date of multidisciplinary goals of care discussion [12/31]  CRITICAL CARE Performed by: Cristal Generous   Total critical care time: 36 minutes  Critical care time was exclusive of separately billable procedures and treating other patients. Critical care was necessary to  treat or prevent imminent or life-threatening deterioration.  Critical care was time spent personally by me on the following activities: development of treatment plan with patient and/or surrogate as well as nursing, discussions with consultants, evaluation of patient's response to treatment, examination of patient, obtaining history from patient or surrogate, ordering and performing treatments and interventions, ordering and review of laboratory studies, ordering and review of radiographic studies, pulse oximetry and re-evaluation of patient's condition.  Eliseo Gum MSN, AGACNP-BC [place] for pager  10/16/2022, 10:50 AM

## 2022-10-16 NOTE — Procedures (Signed)
Patient Name: Damon [name]  MRN: [id number]  Epilepsy Attending: Lora Havens  Referring Physician/Provider: Amie Portland, MD  Duration: 10/15/2022 1625 to 10/16/2022 1625   Patient history: 62yo M s/p cardiac arrest. EEG to evaluate for seizure   Level of alertness:  comatose   AEDs during EEG study: LEV, VPA, Clonazepam, Propofol    Technical aspects: This EEG study was done with scalp electrodes positioned according to the 10-20 International system of electrode placement. Electrical activity was reviewed with band pass filter of 1-70Hz , sensitivity of 7 uV/mm, display speed of 99mm/sec with a 60Hz  notched filter applied as appropriate. EEG data were recorded continuously and digitally stored.  Video monitoring was available and reviewed as appropriate.   Description: EEG predominantly showed burst suppression with near continuous generalized highly epileptiform bursts and brief 0.5-1 second of generalized suppression. EEG was not reactive to tactile stimulation. Hyperventilation and photic stimulation were not performed.      IMPRESSION: - Burst suppression with highly epileptiform bursts, generalized   IMPRESSION: This study showed evidence of generalized epileptogenicity with high potential for seizures as well as profound diffuse encephalopathy. In the setting of cardiac arrest, this EEG pattern is suggestive of anoxic/hypoxic brain injury.    Helvi Royals Barbra Sarks

## 2022-10-16 NOTE — Progress Notes (Signed)
Daily Progress Note   Patient Name: Damon [name]       Date: 10/16/2022 DOB: 03-25-1961  Age: 62 y.o. MRN#: [id number] Attending Physician: Juanito Doom, MD Primary Care Physician: [facility] Admit Date: 10/03/2022  Reason for Consultation/Follow-up: Establishing goals of care  Patient Profile/HPI:  62 y/o male admitted to [facility] on 12/20 in setting of acute hypoxemic respiratory failure due to Flu A. Placed on BIPAP. Had a 5 minute PEA arrest, required intubation. After several days on mechanical ventilation remains comatose, MRI brain findings worrisome for anoxic brain injury. Moved to [facility] for LTM EEG monitoring.  Palliative care has been asked to get involved for further goals of care conversations in the setting of anoxic brain injury post arrest. 12/31- no improvements, prognosis has been discussed extensively with family by palliative team, neurology and PCCM providers.   Subjective: Chart reviewed including labs, progress notes, imaging from this and previous encounters.  Met with patient's spouse, [name], and daughter, Manson Allan.  [name] is aware of patient's current condition and recommendations for comfort measures and EOL care.  She is working on her own heart and relationship with God trying to get herself to a place where she is able to let him go. She is not there yet.  She expressed the need to simply sit with him a few minutes per day without discussing with medical providers his poor prognosis.   She requested emotional support- we discussed grief counseling through Hospice and she agreed to visit from Palliative chaplain.  I shared with her that there will come a time when concrete decisions will need to be made and we will have to have further discussions (ie  trach, PEG, LTC, etc.).  She understood. She wants for all medical interventions to be provided to keep his heart beating and said that when God is ready to take [name] his heart will stop no matter what our medical interventions are.   Review of Systems  Unable to perform ROS: Intubated     Physical Exam Vitals and nursing note reviewed.  Cardiovascular:     Rate and Rhythm: Normal rate.  Pulmonary:     Comments: intubated            Vital Signs: BP 115/81   Pulse (!) 109   Temp (!) 97.5 F (  36.4 C) (Oral)   Resp (!) 21   Ht _0  (1.626 m)   Wt 59 kg   SpO2 99%   BMI 22.33 kg/m  SpO2: SpO2: 99 % O2 Device: O2 Device: Ventilator O2 Flow Rate:    Intake/output summary:  Intake/Output Summary (Last 24 hours) at 10/16/2022 1227 Last data filed at 10/16/2022 1200 Gross per 24 hour  Intake 2292.67 ml  Output 1620 ml  Net 672.67 ml    LBM: Last BM Date : 10/16/22 Baseline Weight: Weight: 64.9 kg Most recent weight: Weight: 59 kg       Palliative Assessment/Data: PPS: 10%      Patient Active Problem List   Diagnosis Date Noted   Anoxic encephalopathy ([place]) 10/16/2022   Seizure ([place]) 10/16/2022   Pressure injury of skin 10/16/2022   Acute respiratory failure with hypoxia ([place]) 10/03/2022   CAP (community acquired pneumonia) 10/03/2022   Influenza and pneumonia 10/03/2022   Elevated LFTs 10/03/2022   HTN (hypertension) 10/03/2022   HLD (hyperlipidemia) 10/03/2022   Tobacco abuse 10/03/2022   SIRS (systemic inflammatory response syndrome) ([place]) 10/03/2022   Influenza A 10/03/2022   Cardiac arrest ([place]) 10/03/2022   Tobacco dependence 10/03/2022    Palliative Care Assessment & Plan    Assessment/Recommendations/Plan  Continue current plan Best plan is to give [name] a few days of space as she has requested unless an urgent/specific decision is needed  Chaplain consult for support PMT provider will also continue to support [name]  Code  Status: DNR  Prognosis:  Unable to determine  Discharge Planning: To Be Determined  Care plan was discussed with patient's spouse and care team.   Thank you for allowing the Palliative Medicine Team to assist in the care of this patient.  Total time: 90 minutes  Greater than 50%  of this time was spent counseling and coordinating care related to the above assessment and plan.  [name], AGNP-C Palliative Medicine   Please contact Palliative Medicine Team phone at [phone] for questions and concerns.

## 2022-10-16 NOTE — Progress Notes (Signed)
LTM maint complete - no skin breakdown under: Fp1 Fp2 F7 Serviced O2 Cz P3 [facility] monitored, Event button test confirmed by [facility].

## 2022-10-17 DIAGNOSIS — Z9911 Dependence on respirator [ventilator] status: Secondary | ICD-10-CM | POA: Diagnosis not present

## 2022-10-17 DIAGNOSIS — G931 Anoxic brain damage, not elsewhere classified: Secondary | ICD-10-CM

## 2022-10-17 DIAGNOSIS — Z515 Encounter for palliative care: Secondary | ICD-10-CM | POA: Diagnosis not present

## 2022-10-17 DIAGNOSIS — Z7189 Other specified counseling: Secondary | ICD-10-CM | POA: Diagnosis not present

## 2022-10-17 DIAGNOSIS — R4182 Altered mental status, unspecified: Secondary | ICD-10-CM | POA: Diagnosis not present

## 2022-10-17 DIAGNOSIS — J9601 Acute respiratory failure with hypoxia: Secondary | ICD-10-CM | POA: Diagnosis not present

## 2022-10-17 LAB — BASIC METABOLIC PANEL
Anion gap: 11 (ref 5–15)
BUN: 30 mg/dL — ABNORMAL HIGH (ref 6–20)
CO2: 26 mmol/L (ref 22–32)
Calcium: 8.3 mg/dL — ABNORMAL LOW (ref 8.9–10.3)
Chloride: 98 mmol/L (ref 98–111)
Creatinine, Ser: 0.78 mg/dL (ref 0.61–1.24)
GFR, Estimated: >60 mL/min (ref >60)
Glucose, Bld: 139 mg/dL — ABNORMAL HIGH (ref 70–99)
Potassium: 4.3 mmol/L (ref 3.5–5.1)
Sodium: 135 mmol/L (ref 135–145)

## 2022-10-17 LAB — GLUCOSE, CAPILLARY
Glucose-Capillary: 128 mg/dL — ABNORMAL HIGH (ref 70–99)
Glucose-Capillary: 155 mg/dL — ABNORMAL HIGH (ref 70–99)
Glucose-Capillary: 131 mg/dL — ABNORMAL HIGH (ref 70–99)
Glucose-Capillary: 116 mg/dL — ABNORMAL HIGH (ref 70–99)
Glucose-Capillary: 142 mg/dL — ABNORMAL HIGH (ref 70–99)

## 2022-10-17 LAB — CBC
HCT: 26.6 % — ABNORMAL LOW (ref 39.0–52.0)
Hemoglobin: 8.7 g/dL — ABNORMAL LOW (ref 13.0–17.0)
MCH: 27.7 pg (ref 26.0–34.0)
MCHC: 32.7 g/dL (ref 30.0–36.0)
MCV: 84.7 fL (ref 80.0–100.0)
Platelets: 452 10*3/uL — ABNORMAL HIGH (ref 150–400)
RBC: 3.14 MIL/uL — ABNORMAL LOW (ref 4.22–5.81)
RDW: 16.6 % — ABNORMAL HIGH (ref 11.5–15.5)
WBC: 21.9 10*3/uL — ABNORMAL HIGH (ref 4.0–10.5)
nRBC: 0 % (ref 0.0–0.2)

## 2022-10-17 LAB — CULTURE, RESPIRATORY W GRAM STAIN: Culture: NORMAL

## 2022-10-17 LAB — TRIGLYCERIDES: Triglycerides: 193 mg/dL — ABNORMAL HIGH (ref <150)

## 2022-10-17 NOTE — Progress Notes (Signed)
This chaplain responded to PMT NP-Kasie for spiritual care with the Pt. wife-Jennifer. The chaplain phoned [name] using [phone]. The chaplain made plans to meet [facility] in the Pt. room Thursday @ 11:30am.  Lillia [place] [phone]

## 2022-10-17 NOTE — Progress Notes (Addendum)
NAME:  Damon [name], MRN:  [id number], DOB:  June 16, 1961, LOS: 39 ADMISSION DATE:  10/03/2022, CONSULTATION DATE:  12/20 REFERRING MD:  Marylyn Ishihara, CHIEF COMPLAINT:  Dyspnea   History of Present Illness:  62 y/o male admitted to [facility] on 12/20 in setting of acute hypoxemic respiratory failure due to Flu A. Placed on BIPAP.  Had a 5 minute PEA arrest, required intubation.  After several days on mechanical ventilation remains comatose, MRI brain findings worrisome for anoxic brain injury.  Moved to [facility] for LTM EEG monitoring.  Pertinent  Medical History   Hypertension Hyperlipidemia Tobacco abuse   Significant Hospital Events: Including procedures, antibiotic start and stop dates in addition to other pertinent events   12/18 Flu A positive 12/20 Admitted, 5 minute code after non-compliance with BiPAP 12/21 Intubated, paralyzed, ARDS protocol, on 7cc/kg 12/22 Weaned to 6 cc/kg with dyssynchrony, back on 7 cc/kg, driving pressures good, weaning vent 12/23 Attempt to wean sedation again with dyssynchrony and desaturation.  Some concern for cuff leak, tube exchange.  Cuff did not appear to be blown.  Ongoing signs of cuff leak, query leaking around cough, possible large trachea, diuresing 12/25 tolerating PSV, myoclonus> ceribell, Neuro consult, changed to propofol, diuresed  12/26 Changed to DNR. On Propofol, versed.  Tolerating PSV.  MRI brain with hypoxic/anoxic injury 12/27 GPD's on EEG, pending transfer to [facility] for cEEG 12/28 moved to [facility] for LTM EEG 12/31 family updated by neuro: no chance for meaningful recovery, they should make plans for comfort measures 1/2 eeg still with epileptogenicity. Palli fam mtg -- family does not want to hear from medical teams unless there is "good news"  1/3  cEEG with burst suppression w highly epileptiform bursts.   Interim History / Subjective:   Palliative meeting 1/2 noted  EEG w burst suppression, highly epileptiform bursts   BMP stable CBC w WBC 22  hgb 8.7  Trigs 193 on prop gtt   Objective   Blood pressure 130/86, pulse (!) 116, temperature 99.9 F (37.7 C), temperature source Axillary, resp. rate (!) 23, height 5\' 4"  (1.626 m), weight 59 kg, SpO2 98 %.    Vent Mode: PRVC FiO2 (%):  [30 %] 30 % Set Rate:  [15 bmp] 15 bmp Vt Set:  [550 mL] 550 mL PEEP:  [5 cmH20] 5 cmH20 Pressure Support:  [8 cmH20] 8 cmH20   Intake/Output Summary (Last 24 hours) at 10/17/2022 5465 Last data filed at 10/17/2022 0600 Gross per 24 hour  Intake 1948.17 ml  Output 1630 ml  Net 318.17 ml   Filed Weights   10/12/22 0600 10/13/22 0400 10/15/22 0500  Weight: 64.7 kg 62.2 kg 59 kg    Examination:  General:  critically and chronically ill M intubated sedated  HENT: cEEG in placed, ETT secure  PULM: CTAb anteriorly, mechanically ventilated  CV:  rr s1s2 cap refill  < 3sec  GI: thin, soft ndnt MSK: no acute joint deformity  Neuro: sedate. Symmetrical pupils   Resolved Hospital Problem list   AKI Hypernatremia ARDS  Assessment & Plan:   Acute respiratory failure with hypoxia  Flu and strep pneumo PNA Cont MV support, wean as able Droplet precautions for flu Cont zosyn  Have not approached topic of trach with family as think GOC more prudent and trach would not change overall prognosis   Cardiac arrest Anoxic encephalopathy Seizure Neuro following -- on VPA, LEV, prop gtt, [place] clonazepam and PRN versed  cEEG   Anemia Leukocytosis  PRN CBC  Zosyn as above  follow fever curve and in progress cx data  HTN Coreg PRN hydral  Severe protein calorie malnutrition EN per RDN  Elevated triglycerides  Benefit of cont prop outweighs risk of elevated trigs at this point Cont to trend   Hyperglycemia Monitoring, no SSI at this point   [place] DNR status Remains comatose.  Severe anoxic injury  At this point focus needs to be on comfort measures as there is no meaningful chance of a neurologic or functional recovery.   PCM mtg 1/2  during which fam requested space/no negative updates. See their note for specifics.   L/T/D Maintain ETT Maintain Foley Maintain g tube  Will see if we can get adequate PIV access est 1/3 to facilitate dc LIJ CVC.   Best Practice (right click and "Reselect all SmartList Selections" daily)   Diet/type: tubefeeds DVT prophylaxis: LMWH GI prophylaxis: PPI Lines: CVL, maintain Foley:  Yes, and it is still needed Code Status:  DNR Last date of multidisciplinary goals of care discussion [1/2]  CRITICAL CARE Performed by: Cristal Generous   Total critical care time: 36 minutes  Critical care time was exclusive of separately billable procedures and treating other patients. Critical care was necessary to treat or prevent imminent or life-threatening deterioration.  Critical care was time spent personally by me on the following activities: development of treatment plan with patient and/or surrogate as well as nursing, discussions with consultants, evaluation of patient's response to treatment, examination of patient, obtaining history from patient or surrogate, ordering and performing treatments and interventions, ordering and review of laboratory studies, ordering and review of radiographic studies, pulse oximetry and re-evaluation of patient's condition.  Eliseo Gum MSN, AGACNP-BC [place] for pager  10/17/2022, 9:17 AM

## 2022-10-17 NOTE — Progress Notes (Signed)
LTM maint complete - no skin breakdown under: Fp1 Fp3 F7 Serviced C3 P3 [facility] monitored, Event button test confirmed by [facility].

## 2022-10-17 NOTE — Progress Notes (Signed)
Daily Progress Note   Patient Name: Damon [name]       Date: 10/17/2022 DOB: 11/17/1960  Age: 62 y.o. MRN#: [id number] Attending Physician: Julian Hy, DO Primary Care Physician: [facility] Admit Date: 10/03/2022  Reason for Consultation/Follow-up: Establishing goals of care  Patient Profile/HPI:  62 y/o male admitted to [facility] on 12/20 in setting of acute hypoxemic respiratory failure due to Flu A. Placed on BIPAP. Had a 5 minute PEA arrest, required intubation. After several days on mechanical ventilation remains comatose, MRI brain findings worrisome for anoxic brain injury. Moved to [facility] for LTM EEG monitoring.  Palliative care has been asked to get involved for further goals of care conversations in the setting of anoxic brain injury post arrest. 12/31- no improvements, prognosis has been discussed extensively with family by palliative team, neurology and PCCM providers. 1/2- f/u meeting with patient's spouse Damon [name]- she requested time and space as she grapples with processing eventual loss of spouse and how her life will be without him, she is not yet in a place to consider transition to comfort  Subjective: Chart reviewed including labs, progress notes, imaging from this and previous encounters.  Noted EEG with burst suppression and highly epileptiform bursts. He continues to be unresponsive on my exam. Looks like he's tolerating pressure support on vent.  Saw spouse outside of room- did not discuss patient's status as she requested. Emotional support provided. She is looking forward to her meeting with Palliative Chaplain Dorian Pod tomorrow.   Review of Systems  Unable to perform ROS: Intubated     Physical Exam Vitals and nursing note reviewed.  Constitutional:       Appearance: He is ill-appearing.     Comments: Frail, diaphoretic  Cardiovascular:     Rate and Rhythm: Normal rate and regular rhythm.     Heart sounds: Normal heart sounds.  Pulmonary:     Breath sounds: Normal breath sounds.     Comments: intubated Neurological:     Comments: unresponsive             Vital Signs: BP 119/79   Pulse (!) 105   Temp 100.3 F (37.9 C) (Axillary) Comment: Tylenol given  Resp 19   Ht 5\' 4"  (1.626 m)   Wt 59 kg   SpO2 99%   BMI 22.33  kg/m  SpO2: SpO2: 99 % O2 Device: O2 Device: Ventilator O2 Flow Rate:    Intake/output summary:  Intake/Output Summary (Last 24 hours) at 10/17/2022 1354 Last data filed at 10/17/2022 1300 Gross per 24 hour  Intake 2003.49 ml  Output 980 ml  Net 1023.49 ml   LBM: Last BM Date : 10/16/22 Baseline Weight: Weight: 64.9 kg Most recent weight: Weight: 59 kg       Palliative Assessment/Data: PPS: 10%      Patient Active Problem List   Diagnosis Date Noted   Anoxic encephalopathy ([place]) 10/16/2022   Seizure ([place]) 10/16/2022   Pressure injury of skin 10/16/2022   Acute hypoxic respiratory failure ([place]) 10/03/2022   CAP (community acquired pneumonia) 10/03/2022   Influenza and pneumonia 10/03/2022   Elevated LFTs 10/03/2022   HTN (hypertension) 10/03/2022   HLD (hyperlipidemia) 10/03/2022   Tobacco abuse 10/03/2022   SIRS (systemic inflammatory response syndrome) ([place]) 10/03/2022   Influenza A 10/03/2022   Cardiac arrest ([place]) 10/03/2022   Tobacco dependence 10/03/2022    Palliative Care Assessment & Plan    Assessment/Recommendations/Plan  Continue current plan of care- allowing patient's spouse time and space while she considers extubation and comfort measures PMT will continue to follow and engage with patient's spouse  Code Status: DNR  Prognosis:  Unable to determine  Discharge Planning: To Be Determined  Care plan was discussed with patient's spouse- Damon [name]  Thank you for allowing  the Palliative Medicine Team to assist in the care of this patient.  Greater than 50%  of this time was spent counseling and coordinating care related to the above assessment and plan.  [name], AGNP-C Palliative Medicine   Please contact Palliative Medicine Team phone at [phone] for questions and concerns.

## 2022-10-17 NOTE — Procedures (Signed)
Patient Name: Jerusalem Wert  MRN: [id number]  Epilepsy Attending: Lora Havens  Referring Physician/Provider: Amie Portland, MD  Duration: 10/16/2022 1625 to 10/17/2022 1625   Patient history: 62yo M s/p cardiac arrest. EEG to evaluate for seizure   Level of alertness:  comatose   AEDs during EEG study: LEV, VPA, Clonazepam, Propofol    Technical aspects: This EEG study was done with scalp electrodes positioned according to the 10-20 International system of electrode placement. Electrical activity was reviewed with band pass filter of 1-70Hz , sensitivity of 7 uV/mm, display speed of 55mm/sec with a 60Hz  notched filter applied as appropriate. EEG data were recorded continuously and digitally stored.  Video monitoring was available and reviewed as appropriate.   Description: EEG predominantly showed burst suppression with near continuous generalized highly epileptiform bursts and brief 0.5-1 second of generalized suppression. EEG was not reactive to tactile stimulation. Hyperventilation and photic stimulation were not performed.      IMPRESSION: - Burst suppression with highly epileptiform bursts, generalized   IMPRESSION: This study showed evidence of generalized epileptogenicity with high potential for seizures as well as profound diffuse encephalopathy. In the setting of cardiac arrest, this EEG pattern is suggestive of anoxic/hypoxic brain injury.    Yonatan Guitron Barbra Sarks

## 2022-10-17 NOTE — Progress Notes (Signed)
LTM maint complete - no skin breakdown under: Fp1 Fp2  Serviced C3 C4 [facility] monitored, Event button test confirmed by [facility].

## 2022-10-18 DIAGNOSIS — R4182 Altered mental status, unspecified: Secondary | ICD-10-CM | POA: Diagnosis not present

## 2022-10-18 DIAGNOSIS — R6510 Systemic inflammatory response syndrome (SIRS) of non-infectious origin without acute organ dysfunction: Secondary | ICD-10-CM

## 2022-10-18 DIAGNOSIS — Z9911 Dependence on respirator [ventilator] status: Secondary | ICD-10-CM | POA: Diagnosis not present

## 2022-10-18 DIAGNOSIS — J101 Influenza due to other identified influenza virus with other respiratory manifestations: Secondary | ICD-10-CM | POA: Diagnosis not present

## 2022-10-18 DIAGNOSIS — J9601 Acute respiratory failure with hypoxia: Secondary | ICD-10-CM | POA: Diagnosis not present

## 2022-10-18 DIAGNOSIS — G931 Anoxic brain damage, not elsewhere classified: Secondary | ICD-10-CM | POA: Diagnosis not present

## 2022-10-18 DIAGNOSIS — Z7189 Other specified counseling: Secondary | ICD-10-CM | POA: Diagnosis not present

## 2022-10-18 LAB — GLUCOSE, CAPILLARY
Glucose-Capillary: 118 mg/dL — ABNORMAL HIGH (ref 70–99)
Glucose-Capillary: 120 mg/dL — ABNORMAL HIGH (ref 70–99)
Glucose-Capillary: 123 mg/dL — ABNORMAL HIGH (ref 70–99)
Glucose-Capillary: 129 mg/dL — ABNORMAL HIGH (ref 70–99)
Glucose-Capillary: 130 mg/dL — ABNORMAL HIGH (ref 70–99)
Glucose-Capillary: 131 mg/dL — ABNORMAL HIGH (ref 70–99)
Glucose-Capillary: 153 mg/dL — ABNORMAL HIGH (ref 70–99)

## 2022-10-18 LAB — CBC WITH DIFFERENTIAL/PLATELET
Abs Immature Granulocytes: 1.4 10*3/uL — ABNORMAL HIGH (ref 0.00–0.07)
Basophils Absolute: 0.2 10*3/uL — ABNORMAL HIGH (ref 0.0–0.1)
Basophils Relative: 1 %
Eosinophils Absolute: 0 10*3/uL (ref 0.0–0.5)
Eosinophils Relative: 0 %
HCT: 28.2 % — ABNORMAL LOW (ref 39.0–52.0)
Hemoglobin: 9.4 g/dL — ABNORMAL LOW (ref 13.0–17.0)
Lymphocytes Relative: 5 %
Lymphs Abs: 1.1 10*3/uL (ref 0.7–4.0)
MCH: 28.4 pg (ref 26.0–34.0)
MCHC: 33.3 g/dL (ref 30.0–36.0)
MCV: 85.2 fL (ref 80.0–100.0)
Metamyelocytes Relative: 3 %
Monocytes Absolute: 3.2 10*3/uL — ABNORMAL HIGH (ref 0.1–1.0)
Monocytes Relative: 14 %
Myelocytes: 2 %
Neutro Abs: 16.7 10*3/uL — ABNORMAL HIGH (ref 1.7–7.7)
Neutrophils Relative %: 74 %
Platelets: 407 10*3/uL — ABNORMAL HIGH (ref 150–400)
Promyelocytes Relative: 1 %
RBC: 3.31 MIL/uL — ABNORMAL LOW (ref 4.22–5.81)
RDW: 16.6 % — ABNORMAL HIGH (ref 11.5–15.5)
Smear Review: NORMAL
WBC: 22.5 10*3/uL — ABNORMAL HIGH (ref 4.0–10.5)
nRBC: 0 % (ref 0.0–0.2)

## 2022-10-18 MED ORDER — LEVETIRACETAM 100 MG/ML PO SOLN
1500.0000 mg | Freq: Two times a day (BID) | ORAL | Status: DC
  Administered 2022-10-18 – 2022-12-12 (×110): 1500 mg
  Administered 2022-12-13: 1000 mg
  Administered 2022-12-13 – 2023-01-22 (×80): 1500 mg
  Filled 2022-10-18 (×195): qty 15

## 2022-10-18 MED ORDER — GLYCOPYRROLATE 0.2 MG/ML IJ SOLN
0.1000 mg | Freq: Three times a day (TID) | INTRAMUSCULAR | Status: DC | PRN
  Administered 2022-10-18 – 2022-10-27 (×7): 0.1 mg via INTRAVENOUS
  Filled 2022-10-18 (×7): qty 1

## 2022-10-18 NOTE — Procedures (Addendum)
Patient Name: Damon [name]  MRN: [id number]  Epilepsy Attending: Lora Havens  Referring Physician/Provider: Amie Portland, MD  Duration: 10/17/2022 1625 to 10/18/2022 1514   Patient history: 62yo M s/p cardiac arrest. EEG to evaluate for seizure   Level of alertness:  comatose   AEDs during EEG study: LEV, VPA, Clonazepam, Propofol    Technical aspects: This EEG study was done with scalp electrodes positioned according to the 10-20 International system of electrode placement. Electrical activity was reviewed with band pass filter of 1-70Hz , sensitivity of 7 uV/mm, display speed of 32mm/sec with a 60Hz  notched filter applied as appropriate. EEG data were recorded continuously and digitally stored.  Video monitoring was available and reviewed as appropriate.   Description: EEG predominantly showed burst suppression with near continuous generalized highly epileptiform bursts and brief 0.5-1 second of generalized suppression. EEG was not reactive to tactile stimulation. Hyperventilation and photic stimulation were not performed.      IMPRESSION: - Burst suppression with highly epileptiform bursts, generalized   IMPRESSION: This study showed evidence of generalized epileptogenicity with high potential for seizures as well as profound diffuse encephalopathy. In the setting of cardiac arrest, this EEG pattern is suggestive of anoxic/hypoxic brain injury.    Micha Dosanjh Barbra Sarks

## 2022-10-18 NOTE — Progress Notes (Addendum)
Daily Progress Note   Patient Name: Damon [name]       Date: 10/18/2022 DOB: 1960/11/09  Age: 62 y.o. MRN#: [id number] Attending Physician: Julian Hy, DO Primary Care Physician: [facility] Admit Date: 10/03/2022  Reason for Consultation/Follow-up: Establishing goals of care  Patient Profile/HPI:  62 y/o male admitted to [facility] on 12/20 in setting of acute hypoxemic respiratory failure due to Flu A. Placed on BIPAP. Had a 5 minute PEA arrest, required intubation. After several days on mechanical ventilation remains comatose, MRI brain findings worrisome for anoxic brain injury. Moved to [facility] for LTM EEG monitoring.  Palliative care has been asked to get involved for further goals of care conversations in the setting of anoxic brain injury post arrest. 12/31- no improvements, prognosis has been discussed extensively with family by palliative team, neurology and PCCM providers. 1/2- f/u meeting with patient's spouse [name]- she requested time and space as she grapples with processing eventual loss of spouse and how her life will be without him, she is not yet in a place to consider transition to comfort  Subjective: Chart reviewed including labs, progress notes, imaging from this and previous encounters.  Discussed with attending team- noted their plan to stop propofol, use midazolam for seizures.  Discussed with chaplain.  Long discussion with patient's spouse this afternoon. Primarily listening to her vent her frustrations, her concerns that patient should not have been sent home from the emergency department on 12/18. She is experiencing guilt- says she should never had made him go in the first place, and also anger, sharing her feelings that if he had not come into the hospital he  would not have progressed to the state that he is now.  She was in no state to have any discussions about goals of care. She also does not want to be approached about organ donation.   Review of Systems  Unable to perform ROS: Intubated     Physical Exam Vitals and nursing note reviewed.  Constitutional:      Appearance: He is ill-appearing.     Comments: Frail, diaphoretic  Cardiovascular:     Rate and Rhythm: Normal rate and regular rhythm.     Heart sounds: Normal heart sounds.  Pulmonary:     Breath sounds: Normal breath sounds.     Comments:  intubated Neurological:     Comments: unresponsive             Vital Signs: BP 117/74   Pulse (!) 108   Temp 100.3 F (37.9 C) (Oral)   Resp (!) 22   Ht 5\' 4"  (1.626 m)   Wt 59 kg   SpO2 99%   BMI 22.33 kg/m  SpO2: SpO2: 99 % O2 Device: O2 Device: Ventilator O2 Flow Rate:    Intake/output summary:  Intake/Output Summary (Last 24 hours) at 10/18/2022 1356 Last data filed at 10/18/2022 1300 Gross per 24 hour  Intake 1988.64 ml  Output 1825 ml  Net 163.64 ml    LBM: Last BM Date : 10/18/22 Baseline Weight: Weight: 64.9 kg Most recent weight: Weight: 59 kg       Palliative Assessment/Data: PPS: 10%      Patient Active Problem List   Diagnosis Date Noted   On mechanically assisted ventilation ([place]) 10/17/2022   Anoxic brain damage ([place]) 10/17/2022   Anoxic encephalopathy ([place]) 10/16/2022   Seizure ([place]) 10/16/2022   Pressure injury of skin 10/16/2022   Acute hypoxic respiratory failure ([place]) 10/03/2022   CAP (community acquired pneumonia) 10/03/2022   Influenza and pneumonia 10/03/2022   Elevated LFTs 10/03/2022   HTN (hypertension) 10/03/2022   HLD (hyperlipidemia) 10/03/2022   Tobacco abuse 10/03/2022   SIRS (systemic inflammatory response syndrome) ([place]) 10/03/2022   Influenza A 10/03/2022   Cardiac arrest ([place]) 10/03/2022   Tobacco dependence 10/03/2022    Palliative Care Assessment & Plan     Assessment/Recommendations/Plan  Continue current plan of care- allowing patient's spouse time and space Spouse very angry- given room to vent Recommend no discussion regarding organ donation, ever, per spouse's request PMT will follow up on Sunday with [name]  Code Status: DNR  Prognosis:  Unable to determine  Discharge Planning: To Be Determined  Care plan was discussed with patient's spouse- [name]  Thank you for allowing the Palliative Medicine Team to assist in the care of this patient.  Total time: 90 minutes  Greater than 50%  of this time was spent counseling and coordinating care related to the above assessment and plan.  [name], AGNP-C Palliative Medicine   Please contact Palliative Medicine Team phone at [phone] for questions and concerns.

## 2022-10-18 NOTE — Progress Notes (Signed)
LTM EEG discontinued - no skin breakdown at unhook. [facility] notified 

## 2022-10-18 NOTE — Progress Notes (Signed)
I discussed with neurology the utility of continuing continuous EEG monitoring.  At this point he is continuing to have epileptiform discharges and these have not improved over time.  We agree that we do not think that ongoing continuous EEG monitoring is beneficial at this point.  Orders placed to discontinue.  Julian Hy, DO 10/18/22 2:42 PM [facility]

## 2022-10-18 NOTE — Progress Notes (Signed)
Nutrition Follow-up  DOCUMENTATION CODES:   Not applicable  INTERVENTION:   Tube feeding via OG tube: Vital 1.5 at 50 ml/h (1200 ml per day) Prosource TF20 60 ml BID  Provides 1960 kcal, 121 gm protein, 917 ml free water daily  NUTRITION DIAGNOSIS:   Inadequate oral intake related to acute illness (respiratory failure/ventilated) as evidenced by NPO status. Ongoing.   GOAL:   Patient will meet greater than or equal to 90% of their needs Met with TF at goal.   MONITOR:   Vent status, Labs, Weight trends, TF tolerance  REASON FOR ASSESSMENT:   Consult Enteral/tube feeding initiation and management  ASSESSMENT:   Pt admitted from home with the flu leading to acute respiratory failure with hypoxia and PEA arrest upon admission. PMH significant for HTN, HLD, chronic back pain, tobacco use.  Pt discussed during ICU rounds and with RN.   CCM discussing [place] with family due to severe anoxic injury. Palliative care following.   Medications reviewed and include: colace, pepcid, miralax Propofol @ 28 ml/hr provides 739 kcal   Labs reviewed:  TG: 193   Diet Order:   Diet Order             Diet NPO time specified  Diet effective now                   EDUCATION NEEDS:   No education needs have been identified at this time  Skin:  Skin Assessment: Reviewed RN Assessment  Last BM:  12/27  Height:   Ht Readings from Last 1 Encounters:  10/11/22 _0  (1.626 m)    Weight:   Wt Readings from Last 1 Encounters:  10/15/22 59 kg   BMI:  Body mass index is 22.33 kg/m.  Estimated Nutritional Needs:   Kcal:  1900-2100  Protein:  95-110g  Fluid:  >/=1.9L  Raynah Gomes P., RD, LDN, CNSC See AMiON for contact information

## 2022-10-18 NOTE — Progress Notes (Signed)
NAME:  Damon [name], MRN:  [id number], DOB:  1961/05/22, LOS: 85 ADMISSION DATE:  10/03/2022, CONSULTATION DATE:  12/20 REFERRING MD:  Marylyn Ishihara, CHIEF COMPLAINT:  Dyspnea   History of Present Illness:  62 y/o male admitted to [facility] on 12/20 in setting of acute hypoxemic respiratory failure due to Flu A. Placed on BIPAP.  Had a 5 minute PEA arrest, required intubation.  After several days on mechanical ventilation remains comatose, MRI brain findings worrisome for anoxic brain injury.  Moved to [facility] for LTM EEG monitoring.  Pertinent  Medical History   Hypertension Hyperlipidemia Tobacco abuse  Significant Hospital Events: Including procedures, antibiotic start and stop dates in addition to other pertinent events   12/18 Flu A positive 12/20 Admitted, 5 minute code after non-compliance with BiPAP 12/21 Intubated, paralyzed, ARDS protocol, on 7cc/kg 12/22 Weaned to 6 cc/kg with dyssynchrony, back on 7 cc/kg, driving pressures good, weaning vent 12/23 Attempt to wean sedation again with dyssynchrony and desaturation.  Some concern for cuff leak, tube exchange.  Cuff did not appear to be blown.  Ongoing signs of cuff leak, query leaking around cough, possible large trachea, diuresing 12/25 tolerating PSV, myoclonus> ceribell, Neuro consult, changed to propofol, diuresed  12/26 Changed to DNR. On Propofol, versed.  Tolerating PSV.  MRI brain with hypoxic/anoxic injury 12/27 GPD's on EEG, pending transfer to [facility] for cEEG 12/28 moved to [facility] for LTM EEG 12/31 family updated by neuro: no chance for meaningful recovery, they should make plans for comfort measures 1/2 eeg still with epileptogenicity. Palli fam mtg -- family does not want to hear from medical teams unless there is "good news"  1/3  cEEG with burst suppression w highly epileptiform bursts.  1/4 cEEG with burst suppression  + highly epileptiform bursts still.   Interim History / Subjective:   WBC slightly up to 22.5 Temp high 99 low  100   Objective   Blood pressure 114/72, pulse (!) 117, temperature 100.2 F (37.9 C), temperature source Axillary, resp. rate (!) 22, height 5\' 4"  (1.626 m), weight 59 kg, SpO2 98 %.    Vent Mode: PSV;CPAP FiO2 (%):  [30 %] 30 % Set Rate:  [15 bmp] 15 bmp Vt Set:  [550 mL] 550 mL PEEP:  [5 cmH20] 5 cmH20 Pressure Support:  [8 cmH20-10 cmH20] 8 cmH20 Plateau Pressure:  [16 cmH20] 16 cmH20   Intake/Output Summary (Last 24 hours) at 10/18/2022 0955 Last data filed at 10/18/2022 0920 Gross per 24 hour  Intake 2005.39 ml  Output 2225 ml  Net -219.61 ml   Filed Weights   10/12/22 0600 10/13/22 0400 10/15/22 0500  Weight: 64.7 kg 62.2 kg 59 kg    Examination:  General:  critically and chronically ill adult M intubated sedated NAD HENT: NCAT ETT secure, EEG in place, tacky pink mm  PULM: CTAb mechanically ventilated symmetrical chest expansion  CV:  tachycardic, regular GI: thin ndnt  MSK: no acute joint deformity.  Neuro: sedated. 21mm pupils   Resolved Hospital Problem list   AKI Hypernatremia ARDS  Assessment & Plan:   Acute respiratory failure with hypoxia Flu and strep pneumo PNA P Cont MV support, wean as able VAP, pulm hygiene  Droplet precautions for flu 7d course zosyn  PRN CXR  GOC talks ongoing- CCM has not approached topic of trach   Cardiac arrest Anoxic encephalopathy Seizure  Evidence on MRI and EEG P Neuro following  LEV, VPA, prop gtt, [place] clonazepam and PRN versed  cEEG  Leukocytosis Anemia, stable  Zosyn as above Follow fever curve PRN CBC CVC and foley were dc 1/3   HTN Cont [place] coreg, PRN hydral  Cardiac monitoring   Sev. Protein calorie malnutrition EN per RDN  Elevated triglycerides  Benefit of cont prop outweighs risk of elevated trigs at this point Cont to trend   Hyperglycemia Monitoring, no SSI at this point   [place] DNR status Remains comatose.  Severe anoxic injury  At this point focus needs to be on comfort measures  as there is no meaningful chance of a neurologic or functional recovery.   PCM mtg 1/2 during which fam requested space/no negative updates. See their note for specifics.   L/T/D Maintain ETT Maintain g tube   Best Practice (right click and "Reselect all SmartList Selections" daily)   Diet/type: tubefeeds DVT prophylaxis: LMWH GI prophylaxis: PPI Lines: n/a Foley:  N/A Code Status:  DNR Last date of multidisciplinary goals of care discussion [1/2]  CRITICAL CARE Performed by: Cristal Generous   Total critical care time: 36 minutes  Critical care time was exclusive of separately billable procedures and treating other patients. Critical care was necessary to treat or prevent imminent or life-threatening deterioration.  Critical care was time spent personally by me on the following activities: development of treatment plan with patient and/or surrogate as well as nursing, discussions with consultants, evaluation of patient's response to treatment, examination of patient, obtaining history from patient or surrogate, ordering and performing treatments and interventions, ordering and review of laboratory studies, ordering and review of radiographic studies, pulse oximetry and re-evaluation of patient's condition.  Eliseo Gum MSN, AGACNP-BC [place] for pager 10/18/2022, 9:55 AM

## 2022-10-18 NOTE — Progress Notes (Signed)
This chaplain is present for a spiritual care visit with the Pt. wife-Jennifer. Jennifer's two daughters joined her during the visit with the chaplain.  The chaplain reflectively and respectfully listened as [name] shared the story of the Pt. medical care beginning at Urgent Care and progressing to [facility] and [place]. The chaplain understands [name] remains curious and angry about the sequence of events leading up to today. The chaplain understands there may be a place of peace in the medical team providing answers to Jennifer's questions. Jennifer's faith accepts "God calling the Pt. home" in God's time.   [name] is experiencing anticipatory grief as she thinks about life without the Pt. and how to communicate the Pt. may never come home to the Pt. grandchildren. The chaplain began education on community grief care resources and the uniqueness of grief in each persons story.  The chaplain updated PMT NP-Kasie after the visit.   [name] accepted the chaplain's invitation for prayer and F/U spiritual care.  Chaplain Sallyanne Kuster [phone]

## 2022-10-18 NOTE — Progress Notes (Signed)
vLTM maintenance   All impedances below 10kohms.  No skin breakdown noted at all skin sites 

## 2022-10-19 DIAGNOSIS — J9601 Acute respiratory failure with hypoxia: Secondary | ICD-10-CM | POA: Diagnosis not present

## 2022-10-19 LAB — TRIGLYCERIDES: Triglycerides: 159 mg/dL — ABNORMAL HIGH (ref <150)

## 2022-10-19 LAB — BASIC METABOLIC PANEL
Anion gap: 9 (ref 5–15)
BUN: 30 mg/dL — ABNORMAL HIGH (ref 6–20)
CO2: 24 mmol/L (ref 22–32)
Calcium: 8.9 mg/dL (ref 8.9–10.3)
Chloride: 104 mmol/L (ref 98–111)
Creatinine, Ser: 0.7 mg/dL (ref 0.61–1.24)
GFR, Estimated: >60 mL/min (ref >60)
Glucose, Bld: 140 mg/dL — ABNORMAL HIGH (ref 70–99)
Potassium: 4.6 mmol/L (ref 3.5–5.1)
Sodium: 137 mmol/L (ref 135–145)

## 2022-10-19 LAB — GLUCOSE, CAPILLARY
Glucose-Capillary: 118 mg/dL — ABNORMAL HIGH (ref 70–99)
Glucose-Capillary: 119 mg/dL — ABNORMAL HIGH (ref 70–99)
Glucose-Capillary: 127 mg/dL — ABNORMAL HIGH (ref 70–99)
Glucose-Capillary: 137 mg/dL — ABNORMAL HIGH (ref 70–99)
Glucose-Capillary: 140 mg/dL — ABNORMAL HIGH (ref 70–99)
Glucose-Capillary: 152 mg/dL — ABNORMAL HIGH (ref 70–99)

## 2022-10-19 NOTE — Progress Notes (Addendum)
+  NAME:  Damon [name], MRN:  [id number], DOB:  [birth year], LOS: 37 ADMISSION DATE:  10/03/2022, CONSULTATION DATE:  12/20 REFERRING MD:  Marylyn Ishihara, CHIEF COMPLAINT:  Dyspnea   History of Present Illness:  62 y/o male admitted to [facility] on 12/20 in setting of acute hypoxemic respiratory failure due to Flu A. Placed on BIPAP.  Had a 5 minute PEA arrest, required intubation.  After several days on mechanical ventilation remains comatose, MRI brain findings worrisome for anoxic brain injury.  Moved to [facility] for LTM EEG monitoring.  Pertinent  Medical History   Hypertension Hyperlipidemia Tobacco abuse   Immunization History  Administered Date(s) Administered   Td 10/06/2013     Significant Hospital Events: Including procedures, antibiotic start and stop dates in addition to other pertinent events   12/18 Flu A positive 12/20 Admitted, 5 minute code after non-compliance with BiPAP 12/21 Intubated, paralyzed, ARDS protocol, on 7cc/kg 12/22 Weaned to 6 cc/kg with dyssynchrony, back on 7 cc/kg, driving pressures good, weaning vent 12/23 Attempt to wean sedation again with dyssynchrony and desaturation.  Some concern for cuff leak, tube exchange.  Cuff did not appear to be blown.  Ongoing signs of cuff leak, query leaking around cough, possible large trachea, diuresing 12/25 tolerating PSV, myoclonus> ceribell, Neuro consult, changed to propofol, diuresed  12/26 Changed to DNR. On Propofol, versed.  Tolerating PSV.  MRI brain with hypoxic/anoxic injury Blood culture neg 12/27 GPD's on EEG, pending transfer to [facility] for cEEG NEURO CONSULT 12/28 moved to [facility] for LTM EEG 12/31 family updated by neuro: no chance for meaningful recovery, they should make plans for comfort measures 1/1 cultures - resp normal flora 1/2 eeg still with epileptogenicity. Palli fam mtg -- family does not want to hear from medical teams unless there is "good news"  1/3  cEEG with burst suppression w highly epileptiform bursts.   1/4 cEEG with burst suppression  + highly epileptiform bursts still.  WBC slightly up to 22.5. Temp high 99 low 100  c-EEG stopped duie to lack of benefit Pallative care consult - wife upset about early dc from ER on 12/18 EEG: generalized epileptogenicity with high potential for seizures as well as profound diffuse encephalopathy. In the setting of cardiac arrest, this EEG pattern is suggestive of anoxic/hypoxic brain injury.   Interim History / Subjective:   1/5 - low grade fever +. [place] 22.5K.  on Zosyn. On vent fio2 30%. TF +. Per RN - diprivan stopped yesterday. No myoclonus today. Mild tachycardia +  Objective   Blood pressure (!) 161/103, pulse (!) 117, temperature 100.2 F (37.9 C), temperature source Oral, resp. rate 17, height 5\' 4"  (1.626 m), weight 59 kg, SpO2 100 %.    Vent Mode: PSV;CPAP FiO2 (%):  [30 %] 30 % PEEP:  [5 cmH20] 5 cmH20 Pressure Support:  [8 cmH20] 8 cmH20   Intake/Output Summary (Last 24 hours) at 10/19/2022 0848 Last data filed at 10/19/2022 0800 Gross per 24 hour  Intake 1491.18 ml  Output 2750 ml  Net -1258.82 ml   Filed Weights   10/12/22 0600 10/13/22 0400 10/15/22 0500  Weight: 64.7 kg 62.2 kg 59 kg   General Appearance:  Looks criticall ill  Head:  Normocephalic, without obvious abnormality, atraumatic Eyes:  PERRL - yes, conjunctiva/corneas - muddy     Ears:  Normal external ear canals, both ears Nose:  G Throat:  ETT TUBE - yes  Neck:  Supple,  No enlargement/tenderness/nodules Lungs: Clear to auscultation bilaterally,  Ventilator   Synchrony - yes Heart:  S1 and S2 normal, no murmur, CVP - no.  Pressors - no Abdomen:  Soft, no masses, no organomegaly Genitalia / Rectal:  Not done Extremities:  Extremities- intact Skin:  ntact in exposed areas . Sacral area - not examined Neurologic:  Sedation - none -> RASS - -4 . Moves all 4s - no. CAM-ICU - canot test . Orientation - no. Pupilar reflex +, gag +. Corneals +. No cough. Does breathe over  vent     Resolved Hospital Problem list   AKI Hypernatremia ARDS  Assessment & Plan:   Acute respiratory failure with hypoxia Flu and strep pneumo PNA   10/19/2022 - > does NOT meet criteria for SBT/Extubation in setting of Acute Respiratory Failure due to acute encepolpahty. L:OS 16 days  P Cont MV support, wean as able VAP, pulm hygiene  Droplet precautions for flu 7d course zosyn  PRN CXR  GOC talks ongoing- palliative care - need to discuss LTAC/Trach based on goals   Cardiac arrest Anoxic encephalopathy Seizure  Evidence on MRI and EEG  10/19/22: off diprivan since yesterday. No visible myoclonus . Per RN has pupils +, corneals + and gag + and breathes over vent but no motor  P Neuro following  LEV, VPA, prop gtt, [place] clonazepam and PRN versed  Monitor off c-EEG -   Leukocytosis - CVC and foley were dc 1/3  Anemia, stable   10/19/22: No change  Zosyn as above Follow fever curve PRN CBC   HTN Cont [place] coreg, PRN hydral  Cardiac monitoring   Sev. Protein calorie malnutrition EN per RDN  Elevated triglycerides  - Benefit of cont prop outweighs risk of elevated trigs at this point  10/19/22: Diprivan off since 10/18/22  PLAn Cont to trend   Hyperglycemia Monitoring, no SSI at this point   [place] DNR status Remains comatose.  Severe anoxic injury   10/19/22: ongoing pallative care conversation  Plan  - ongoging pall care - appropriate for comfort   L/T/D Maintain ETT Maintain g tube   Best Practice (right click and "Reselect all SmartList Selections" daily)   Diet/type: tubefeeds DVT prophylaxis: LMWH GI prophylaxis: PPI Lines: n/a Foley:  N/A Code Status:  DNR Last date of multidisciplinary goals of care discussion [1/2 and 10/18/22 by pall care] - called wife [employer] and updated over phone. She plans to re-goal with pallative care on Sunday 10/21/22 when they are availabler    ATTESTATION & SIGNATURE   The patient Damon [name] is  critically ill with multiple organ systems failure and requires high complexity decision making for assessment and support, frequent evaluation and titration of therapies, application of advanced monitoring technologies and extensive interpretation of multiple databases and discussion with other appropriate health care personnel such as bedside nurses, social workers, case Freight forwarder, consultants, respiratory therapists, nutritionists, secretaries etc.,  Critical care time includes but is not restricted to just documentation time. Documentation can happen in parallel or sequential to care time depending on case mix urgency and priorities for the shift. So, overall critical Care Time devoted to patient care services described in this note is  35  Minutes.   This time reflects time of care of this signee Dr Brand Males which includ does not reflect procedure time, or teaching time or supervisory time of PA/NP/Med student/Med Resident etc but could involve care discussion time     Dr. Brand Males, M.D., [facility].C.P Pulmonary and Critical Care Medicine Medical Director -  [facility] ICU Staff Physician, [place] [facility] Pager: [phone], If no answer or between  15:00h - 7:00h: call [phone]  10/19/2022 9:36 AM   LABS    PULMONARY No results for input(s): "PHART", "PCO2ART", "PO2ART", "HCO3", "TCO2", "O2SAT" in the last 168 hours.  Invalid input(s): "PCO2", "PO2"  CBC Recent Labs  Lab 10/14/22 0500 10/17/22 0419 10/18/22 0418  HGB 9.5* 8.7* 9.4*  HCT 28.0* 26.6* 28.2*  WBC 25.6* 21.9* 22.5*  PLT 462* 452* 407*    COAGULATION No results for input(s): "INR" in the last 168 hours.  CARDIAC  No results for input(s): "TROPONINI" in the last 168 hours. No results for input(s): "PROBNP" in the last 168 hours.   CHEMISTRY Recent Labs  Lab 10/13/22 0610 10/14/22 0500 10/17/22 0419 10/19/22 0614  NA 136 135 135 137  K 5.1  4.7 4.3 4.6  CL 98 98 98 104  CO2 29 30 26 24   GLUCOSE 121* 117* 139* 140*  BUN 28* 29* 30* 30*  CREATININE 0.73 0.78 0.78 0.70  CALCIUM 8.5* 8.4* 8.3* 8.9   Estimated Creatinine Clearance: 81.9 mL/min (by C-G formula based on SCr of 0.7 mg/dL).   LIVER Recent Labs  Lab 10/14/22 0500  AST 95*  ALT 92*  ALKPHOS 117  BILITOT 0.5  PROT 7.1  ALBUMIN 2.1*     INFECTIOUS No results for input(s): "LATICACIDVEN", "PROCALCITON" in the last 168 hours.   ENDOCRINE CBG (last 3)  Recent Labs    10/18/22 2334 10/19/22 0341 10/19/22 0730  GLUCAP 131* 119* 137*         IMAGING x48h  - image(s) personally visualized  -   highlighted in bold No results found.

## 2022-10-20 ENCOUNTER — Inpatient Hospital Stay (HOSPITAL_COMMUNITY): Payer: Medicare Other

## 2022-10-20 DIAGNOSIS — J9601 Acute respiratory failure with hypoxia: Secondary | ICD-10-CM | POA: Diagnosis not present

## 2022-10-20 LAB — CBC
HCT: 30.7 % — ABNORMAL LOW (ref 39.0–52.0)
Hemoglobin: 9.8 g/dL — ABNORMAL LOW (ref 13.0–17.0)
MCH: 27.2 pg (ref 26.0–34.0)
MCHC: 31.9 g/dL (ref 30.0–36.0)
MCV: 85.3 fL (ref 80.0–100.0)
Platelets: 460 10*3/uL — ABNORMAL HIGH (ref 150–400)
RBC: 3.6 MIL/uL — ABNORMAL LOW (ref 4.22–5.81)
RDW: 16.5 % — ABNORMAL HIGH (ref 11.5–15.5)
WBC: 16.5 10*3/uL — ABNORMAL HIGH (ref 4.0–10.5)
nRBC: 0 % (ref 0.0–0.2)

## 2022-10-20 LAB — GLUCOSE, CAPILLARY
Glucose-Capillary: 129 mg/dL — ABNORMAL HIGH (ref 70–99)
Glucose-Capillary: 111 mg/dL — ABNORMAL HIGH (ref 70–99)
Glucose-Capillary: 133 mg/dL — ABNORMAL HIGH (ref 70–99)
Glucose-Capillary: 133 mg/dL — ABNORMAL HIGH (ref 70–99)
Glucose-Capillary: 156 mg/dL — ABNORMAL HIGH (ref 70–99)
Glucose-Capillary: 124 mg/dL — ABNORMAL HIGH (ref 70–99)

## 2022-10-20 LAB — COMPREHENSIVE METABOLIC PANEL
ALT: 251 U/L — ABNORMAL HIGH (ref 0–44)
AST: 194 U/L — ABNORMAL HIGH (ref 15–41)
Albumin: 2.1 g/dL — ABNORMAL LOW (ref 3.5–5.0)
Alkaline Phosphatase: 123 U/L (ref 38–126)
Anion gap: 12 (ref 5–15)
BUN: 35 mg/dL — ABNORMAL HIGH (ref 6–20)
CO2: 23 mmol/L (ref 22–32)
Calcium: 8.9 mg/dL (ref 8.9–10.3)
Chloride: 104 mmol/L (ref 98–111)
Creatinine, Ser: 0.76 mg/dL (ref 0.61–1.24)
GFR, Estimated: >60 mL/min (ref >60)
Glucose, Bld: 137 mg/dL — ABNORMAL HIGH (ref 70–99)
Potassium: 4.6 mmol/L (ref 3.5–5.1)
Sodium: 139 mmol/L (ref 135–145)
Total Bilirubin: 0.4 mg/dL (ref 0.3–1.2)
Total Protein: 7.8 g/dL (ref 6.5–8.1)

## 2022-10-20 LAB — PHOSPHORUS: Phosphorus: 4.1 mg/dL (ref 2.5–4.6)

## 2022-10-20 LAB — MAGNESIUM: Magnesium: 2.2 mg/dL (ref 1.7–2.4)

## 2022-10-20 NOTE — Progress Notes (Signed)
+  NAME:  Damon [name], MRN:  [id number], DOB:  19-Apr-1961, LOS: 17 ADMISSION DATE:  10/03/2022, CONSULTATION DATE:  12/20 REFERRING MD:  Ronaldo Miyamoto, CHIEF COMPLAINT:  Dyspnea   History of Present Illness:  62 y/o male admitted to [facility] on 12/20 in setting of acute hypoxemic respiratory failure due to Flu A. Placed on BIPAP.  Had a 5 minute PEA arrest, required intubation.  After several days on mechanical ventilation remains comatose, MRI brain findings worrisome for anoxic brain injury.  Moved to [facility] for LTM EEG monitoring.  Pertinent  Medical History   Hypertension Hyperlipidemia Tobacco abuse   Immunization History  Administered Date(s) Administered   Td 10/06/2013     Significant Hospital Events: Including procedures, antibiotic start and stop dates in addition to other pertinent events   12/18 Flu A positive 12/20 Admitted, 5 minute code after non-compliance with BiPAP MRSA PCR - neg Urine strep _POS 12/21 Intubated, paralyzed, ARDS protocol, on 7cc/kg 12/22 Weaned to 6 cc/kg with dyssynchrony, back on 7 cc/kg, driving pressures good, weaning vent 12/23 Attempt to wean sedation again with dyssynchrony and desaturation.  Some concern for cuff leak, tube exchange.  Cuff did not appear to be blown.  Ongoing signs of cuff leak, query leaking around cough, possible large trachea, diuresing 12/25 tolerating PSV, myoclonus> ceribell, Neuro consult, changed to propofol, diuresed  12/26 Changed to DNR. On Propofol, versed.  Tolerating PSV.  MRI brain with hypoxic/anoxic injury Blood culture neg 12/27 GPD's on EEG, pending transfer to [facility] for cEEG NEURO CONSULT 12/28 moved to [facility] for LTM EEG 12/31 family updated by neuro: no chance for meaningful recovery, they should make plans for comfort measures 1/1 cultures - resp normal flora 1/2 eeg still with epileptogenicity. Palli fam mtg -- family does not want to hear from medical teams unless there is "good news"  1/3  cEEG with burst  suppression w highly epileptiform bursts.  1/4 cEEG with burst suppression  + highly epileptiform bursts still.  WBC slightly up to 22.5. Temp high 99 low 100  c-EEG stopped duie to lack of benefit Pallative care consult - wife upset about early dc from ER on 12/18 Chaplain consult: Wife is sufering from anticipatory grief + accepting god's way + curious/anger about events leading upto current state EEG: generalized epileptogenicity with high potential for seizures as well as profound diffuse encephalopathy. In the setting of cardiac arrest, this EEG pattern is suggestive of anoxic/hypoxic brain injury.  1/5 - low grade fever +. WEBC 22.5K.  on Zosyn. On vent fio2 30%. TF +. Per RN - diprivan stopped yesterday. No myoclonus today. Mild tachycardia + Rpt pall care on 10/21/22 per wife  Interim History / Subjective:   1/6 - on vent fioq 30%.  Tmax 99,81F ., WBC better. Still off diprivan -> No myoclonus. cXR visualized - devices in place and faiure clear  Objective   Blood pressure 115/76, pulse (!) 102, temperature 99.5 F (37.5 C), temperature source Oral, resp. rate 15, height 5\' 4"  (1.626 m), weight 55.7 kg, SpO2 100 %.    Vent Mode: CPAP;PSV FiO2 (%):  [30 %] 30 % PEEP:  [5 cmH20] 5 cmH20 Pressure Support:  [8 cmH20] 8 cmH20   Intake/Output Summary (Last 24 hours) at 10/20/2022 0744 Last data filed at 10/20/2022 0700 Gross per 24 hour  Intake 1349.83 ml  Output 2000 ml  Net -650.17 ml   Filed Weights   10/13/22 0400 10/15/22 0500 10/20/22 0500  Weight: 62.2 kg 59  kg 55.7 kg     General Appearance:  Looks criticall ill OBESE - NO Head:  Normocephalic, without obvious abnormality, atraumatic Eyes:  PERRL - yes, conjunctiva/corneas - muddy     Ears:  Normal external ear canals, both ears Nose:  G tube - no Throat:  ETT TUBE - yes , OG tube - yes with TF Neck:  Supple,  No enlargement/tenderness/nodules Lungs: Clear to auscultation bilaterally, Ventilator   Synchrony - yes  30% Heart:  S1 and S2 normal, no murmur, CVP - no.  Pressors - no Abdomen:  Soft, no masses, no organomegaly Genitalia / Rectal:  Not done Extremities:  Extremities- intact Skin:  ntact in exposed areas . Sacral area - not examined Neurologic:  Sedation - none -> RASS - -4 . Moves all 4s - no. CAM-ICU - cannot test . Orientation - no     Resolved Hospital Problem list   AKI Hypernatremia ARDS  Assessment & Plan:   Acute respiratory failure with hypoxia/ARDS - Flu and strep pneumo PNA; Sp Tamiflu and Abx   10/20/2022 - > does NOT meet criteria for SBT/Extubation in setting of Acute Respiratory Failure due to coma   P PRVC; no extubation VAP, pulm hygiene  PRN CXR  GOC talks ongoing- palliative care - need to discuss LTAC/Trach based on goals   FLu and CAP -pneumococal pneumonia  1/6 - s/p Rx.   Plan  - monitor off abx   - Zosyn 12/31 - 1/7  - ceftriaxone 12/20- 12/27  - azithro 12/20 - 12/20  - flagyl 12/20 - 12/22  - tamifly 12/21 - 12/25     Cardiac arrest Anoxic encephalopathy with Seizure  - Evidence on MRI and EEG  10/20/22: off diprivan since 1/4. No visible myoclonus . Per RN has pupils +, corneals + and gag + and breathes over vent but no motor. RASS -4  atleast HE is on scheduled oxycoddone since 12/24, schjeduled klonopin since 12/24  keppra and valprpic acid and fent prn , versed prn  P Neuro following  Dc scheduled oxycodone Dc scheduled klonopin Continue valproic acid Continue keppra Monito off c-eeg (Turned off 10/18/22) and and diprivan (off 10/18/22)   Leukocytosis - CVC and foley were dc 1/3  Anemia, stable   10/20/22: No change  PLAN Zosyn as above Follow fever curve PRN CBC   HTN Cont SCH coreg, PRN hydral  Cardiac monitoring   Sev. Protein calorie malnutrition EN per RDN  Elevated triglycerides  - Benefit of cont prop outweighs risk of elevated trigs at this point  10/20/22: Diprivan off since 10/18/22  PLAn Prn trend if  needed  Hyperglycemia Monitoring, no SSI at this point   GOC DNR status Care Giver Distress + (anticiptory grief + upset at outcome +) Remains comatose.  Severe anoxic injury   10/20/22: ongoing pallative care conversation; neet meting 10/21/22 per wife. Care give  Plan  - ongoging pall care - appropriate for comfort   L/T/D Maintain ETT Maintain g tube   Best Practice (right click and "Reselect all SmartList Selections" daily)   Diet/type: tubefeeds DVT prophylaxis: LMWH GI prophylaxis: PPI Lines: n/a Foley:  N/A Code Status:  DNR  Last date of multidisciplinary goals of care discussion [1/2 and 10/18/22 by pall care] - called wife [employer] and updated over phone. She plans to re-goal with pallative care on Sunday 10/21/22 when they are available. Updated wife 10/20/22 - wife might come up to bedside today (is raining)+  ATTESTATION & SIGNATURE   The patient Damon [name] is critically ill with multiple organ systems failure and requires high complexity decision making for assessment and support, frequent evaluation and titration of therapies, application of advanced monitoring technologies and extensive interpretation of multiple databases and discussion with other appropriate health care personnel such as bedside nurses, social workers, case Freight forwarder, consultants, respiratory therapists, nutritionists, secretaries etc.,  Critical care time includes but is not restricted to just documentation time. Documentation can happen in parallel or sequential to care time depending on case mix urgency and priorities for the shift. So, overall critical Care Time devoted to patient care services described in this note is  23  Minutes.   This time reflects time of care of this signee Dr Brand Males which includ does not reflect procedure time, or teaching time or supervisory time of PA/NP/Med student/Med Resident etc but could involve care discussion time     Dr. Brand Males,  M.D., [facility].C.P Pulmonary and Critical Care Medicine Medical Director - [facility] ICU Staff Physician, [place] [facility] Pager: [phone], If no answer or between  15:00h - 7:00h: call [phone]  10/20/2022 8:07 AM   LABS    PULMONARY No results for input(s): "PHART", "PCO2ART", "PO2ART", "HCO3", "TCO2", "O2SAT" in the last 168 hours.  Invalid input(s): "PCO2", "PO2"  CBC Recent Labs  Lab 10/17/22 0419 10/18/22 0418 10/20/22 0429  HGB 8.7* 9.4* 9.8*  HCT 26.6* 28.2* 30.7*  WBC 21.9* 22.5* 16.5*  PLT 452* 407* 460*    COAGULATION No results for input(s): "INR" in the last 168 hours.  CARDIAC  No results for input(s): "TROPONINI" in the last 168 hours. No results for input(s): "PROBNP" in the last 168 hours.   CHEMISTRY Recent Labs  Lab 10/14/22 0500 10/17/22 0419 10/19/22 0614 10/20/22 0429  NA 135 135 137 139  K 4.7 4.3 4.6 4.6  CL 98 98 104 104  CO2 30 26 24 23   GLUCOSE 117* 139* 140* 137*  BUN 29* 30* 30* 35*  CREATININE 0.78 0.78 0.70 0.76  CALCIUM 8.4* 8.3* 8.9 8.9  MG  --   --   --  2.2  PHOS  --   --   --  4.1   Estimated Creatinine Clearance: 77.4 mL/min (by C-G formula based on SCr of 0.76 mg/dL).   LIVER Recent Labs  Lab 10/14/22 0500 10/20/22 0429  AST 95* 194*  ALT 92* 251*  ALKPHOS 117 123  BILITOT 0.5 0.4  PROT 7.1 7.8  ALBUMIN 2.1* 2.1*     INFECTIOUS No results for input(s): "LATICACIDVEN", "PROCALCITON" in the last 168 hours.   ENDOCRINE CBG (last 3)  Recent Labs    10/19/22 2334 10/20/22 0332 10/20/22 0729  GLUCAP 118* 129* 111*         IMAGING x48h  - image(s) personally visualized  -   highlighted in bold No results found.

## 2022-10-20 NOTE — Progress Notes (Signed)
eLink Physician-Brief Progress Note Patient Name: Damon [name] DOB: 1960/12/06 MRN: [id number]   Date of Service  10/20/2022  HPI/Events of Note  Pt has nursing care order to notify if HR > 120, now consistantly above. 122-123  BP 146/89 Tolerating tube feeds  eICU Interventions  Temp 100.1 Tylenol to be given Already on Zosyn Discussed with bedside RN     Intervention Category Intermediate Interventions: Other:  Judd Lien 10/20/2022, 11:29 PM

## 2022-10-21 DIAGNOSIS — Z7189 Other specified counseling: Secondary | ICD-10-CM

## 2022-10-21 DIAGNOSIS — J101 Influenza due to other identified influenza virus with other respiratory manifestations: Secondary | ICD-10-CM | POA: Diagnosis not present

## 2022-10-21 DIAGNOSIS — J9601 Acute respiratory failure with hypoxia: Secondary | ICD-10-CM | POA: Diagnosis not present

## 2022-10-21 DIAGNOSIS — G931 Anoxic brain damage, not elsewhere classified: Secondary | ICD-10-CM | POA: Diagnosis not present

## 2022-10-21 LAB — GLUCOSE, CAPILLARY
Glucose-Capillary: 116 mg/dL — ABNORMAL HIGH (ref 70–99)
Glucose-Capillary: 125 mg/dL — ABNORMAL HIGH (ref 70–99)
Glucose-Capillary: 130 mg/dL — ABNORMAL HIGH (ref 70–99)
Glucose-Capillary: 133 mg/dL — ABNORMAL HIGH (ref 70–99)
Glucose-Capillary: 134 mg/dL — ABNORMAL HIGH (ref 70–99)
Glucose-Capillary: 144 mg/dL — ABNORMAL HIGH (ref 70–99)

## 2022-10-21 NOTE — Progress Notes (Addendum)
+  NAME:  Damon [name], MRN:  [id number], DOB:  1961/03/23, LOS: 18 ADMISSION DATE:  10/03/2022, CONSULTATION DATE:  12/20 REFERRING MD:  Ronaldo Miyamoto, CHIEF COMPLAINT:  Dyspnea   History of Present Illness:  62 y/o male admitted to [facility] on 12/20 in setting of acute hypoxemic respiratory failure due to Flu A. Placed on BIPAP.  Had a 5 minute PEA arrest, required intubation.  After several days on mechanical ventilation remains comatose, MRI brain findings worrisome for anoxic brain injury.  Moved to [facility] for LTM EEG monitoring.  Pertinent  Medical History   Hypertension Hyperlipidemia Tobacco abuse   Immunization History  Administered Date(s) Administered   Td 10/06/2013     Significant Hospital Events: Including procedures, antibiotic start and stop dates in addition to other pertinent events   12/18 Flu A positive 12/20 Admitted, 5 minute code after non-compliance with BiPAP MRSA PCR - neg Urine strep _POS 12/21 Intubated, paralyzed, ARDS protocol, on 7cc/kg 12/22 Weaned to 6 cc/kg with dyssynchrony, back on 7 cc/kg, driving pressures good, weaning vent 12/23 Attempt to wean sedation again with dyssynchrony and desaturation.  Some concern for cuff leak, tube exchange.  Cuff did not appear to be blown.  Ongoing signs of cuff leak, query leaking around cough, possible large trachea, diuresing 12/25 tolerating PSV, myoclonus> ceribell, Neuro consult, changed to propofol, diuresed  12/26 Changed to DNR. On Propofol, versed.  Tolerating PSV.  MRI brain with hypoxic/anoxic injury Blood culture neg 12/27 GPD's on EEG, pending transfer to [facility] for cEEG NEURO CONSULT 12/28 moved to [facility] for LTM EEG 12/31 family updated by neuro: no chance for meaningful recovery, they should make plans for comfort measures 1/1 cultures - resp normal flora 1/2 eeg still with epileptogenicity. Palli fam mtg -- family does not want to hear from medical teams unless there is "good news"  1/3  cEEG with burst  suppression w highly epileptiform bursts.  1/4 cEEG with burst suppression  + highly epileptiform bursts still.  WBC slightly up to 22.5. Temp high 99 low 100  c-EEG stopped duie to lack of benefit Pallative care consult - wife upset about early dc from ER on 12/18 Chaplain consult: Wife is sufering from anticipatory grief + accepting god's way + curious/anger about events leading upto current state EEG: generalized epileptogenicity with high potential for seizures as well as profound diffuse encephalopathy. In the setting of cardiac arrest, this EEG pattern is suggestive of anoxic/hypoxic brain injury.  1/5 - low grade fever +. WEBC 22.5K.  on Zosyn. On vent fio2 30%. TF +. Per RN - diprivan stopped yesterday. No myoclonus today. Mild tachycardia + Rpt pall care on 10/21/22 per wife 1/6 - on vent fioq 30%.  Tmax 99,74F ., WBC better. Still off diprivan -> No myoclonus. cXR visualized - devices in place and faiure clear Oral oxy and klonpin stopped  Interim History / Subjective:   1/7  -still on vent. No change - stil comatose. Cough +. No gag. But breathes over vent +.  LOw grade fever +. WBC reducing.   Objective   Blood pressure (!) 147/94, pulse (!) 122, temperature 99.7 F (37.6 C), temperature source Axillary, resp. rate 18, height 5\' 4"  (1.626 m), weight 53.6 kg, SpO2 100 %.    Vent Mode: AC;PRVC FiO2 (%):  [30 %] 30 % Set Rate:  [15 bmp] 15 bmp Vt Set:  [550 mL] 550 mL PEEP:  [5 cmH20] 5 cmH20 Pressure Support:  [8 cmH20] 8 cmH20 Plateau Pressure:  [  7 cmH20-11 cmH20] 11 cmH20   Intake/Output Summary (Last 24 hours) at 10/21/2022 0952 Last data filed at 10/21/2022 0800 Gross per 24 hour  Intake 1400 ml  Output 3250 ml  Net -1850 ml   Filed Weights   10/15/22 0500 10/20/22 0500 10/21/22 0422  Weight: 59 kg 55.7 kg 53.6 kg    General Appearance:  Looks criticall ill . FRAIL CACHECTIC Head:  Normocephalic, without obvious abnormality, atraumatic Eyes:  PERRL - yes per RN,  conjunctiva/corneas - muddu     Ears:  Normal external ear canals, both ears Nose:  intact Throat:  ETT TUBE - yes , Neck:  Supple,  No enlargement/tenderness/nodules Lungs: Clear to auscultation bilaterally, Ventilator   Synchrony - yes Heart:  S1 and S2 normal, no murmur, CVP - no.  Pressors - no Abdomen:  Soft, no masses, no organomegaly Genitalia / Rectal:  Not done Extremities:  Extremities- intact Skin:  ntact in exposed areas . Sacral area - not examined Neurologic:  Sedation - none -> RASS - -4/-5 . Moves all 4s - no.       Resolved Hospital Problem list   AKI Hypernatremia ARDS  Assessment & Plan:   Acute respiratory failure with hypoxia/ARDS - Flu and strep pneumo PNA; Sp Tamiflu and Abx   10/21/2022 - > does not meet criteria for SBT/Extubation in setting of Acute Respiratory Failure due to coma    P PRVC; no extubation VAP, pulm hygiene  PRN CXR  GOC talks ongoing- palliative care - need to discuss LTAC/Trach based on goals   FLu and CAP -pneumococal pneumonia -  s/p Rx.   1/7 - low grade fever versus afebrile   Plan  - monitor off abx   - Zosyn 12/31 - 1/7  - ceftriaxone 12/20- 12/27  - azithro 12/20 - 12/20  - flagyl 12/20 - 12/22  - tamifly 12/21 - 12/25    Cardiac arrest Anoxic encephalopathy with Seizure  - Evidence on MRI and EEG  10/21/22: off diprivan  and c-EENG since 1/4. Off schduled oxycodone sine 1/6 and klonopin since 1/6: No visible myoclonus. Comatose + . O change  P Continue valproic acid Continue keppra Monito off c-eeg (Turned off 10/18/22) and and diprivan (off 10/18/22)   Leukocytosis - CVC and foley were dc 1/3 . Zosyn 7d complete 10/21/22 Anemia, stable   10/20/22: No change  PLAN Follow fever curve PRN CBC Recheck labs 10/22/22    HTN Cont SCH coreg, PRN hydral  Cardiac monitoring   Sev. Protein calorie malnutrition EN per RDN  Elevated triglycerides  - Benefit of cont prop outweighs risk of elevated trigs at this  point  10/20/22: Diprivan off since 10/18/22  PLAn Prn trend if needed  Hyperglycemia Monitoring, no SSI at this point   GOC DNR status Care Giver Distress + (anticiptory grief + upset at outcome +) Remains comatose.  Severe anoxic injury   10/20/22: ongoing pallative care conversation; neet meting 10/21/22 per wife. Care give  Plan  - ongoging pall care - appropriate for comfort - they aim to revisit 10/21/22   L/T/D Maintain ETT Maintain g tube   Best Practice (right click and "Reselect all SmartList Selections" daily)   Diet/type: tubefeeds DVT prophylaxis: LMWH GI prophylaxis: PPI Lines: n/a Foley:  N/A Code Status:  DNR  Last date of multidisciplinary goals of care discussion [1/2 and 10/18/22 by pall care] - called wife [employer] and updated over phone. She plans to re-goal with pallative care  on Sunday 10/21/22 when they are available. Updated wife 10/20/22 - wife might come up to bedside today (is raining)+. Wife updated by RN and no clear time when she wil come to meet with palliative care. I then called wife 10:20 AM . She said she is in church today. She is unable to say wha time range she can come .  SHe ays she has church commitments today and other family commitments. I pointed out to her that is important to have the conversation. She said "I will do my best to meet her (palliative care) at 4pm". Ifnormed palliative care NP Ocie Bob and she is indicated she is avail till 4.30pm. Wife would not commit to cominng today and said "if I do not make it today I will make it tomorrow"      ATTESTATION & SIGNATURE   The patient Rollin Kotowski is critically ill with multiple organ systems failure and requires high complexity decision making for assessment and support, frequent evaluation and titration of therapies, application of advanced monitoring technologies and extensive interpretation of multiple databases and discussion with other appropriate health care personnel such  as bedside nurses, social workers, case Production designer, theatre/television/film, consultants, respiratory therapists, nutritionists, secretaries etc.,  Critical care time includes but is not restricted to just documentation time. Documentation can happen in parallel or sequential to care time depending on case mix urgency and priorities for the shift. So, overall critical Care Time devoted to patient care services described in this note is  30  Minutes.   This time reflects time of care of this signee Dr Kalman Shan which includ does not reflect procedure time, or teaching time or supervisory time of PA/NP/Med student/Med Resident etc but could involve care discussion time     Dr. Kalman Shan, M.D., [facility].C.P Pulmonary and Critical Care Medicine Medical Director - [facility] ICU Staff Physician, [facility] [place] Pulmonary and Critical Care Pager: [phone], If no answer or between  15:00h - 7:00h: call [phone]  10/21/2022 10:26 AM    LABS    PULMONARY No results for input(s): "PHART", "PCO2ART", "PO2ART", "HCO3", "TCO2", "O2SAT" in the last 168 hours.  Invalid input(s): "PCO2", "PO2"  CBC Recent Labs  Lab 10/17/22 0419 10/18/22 0418 10/20/22 0429  HGB 8.7* 9.4* 9.8*  HCT 26.6* 28.2* 30.7*  WBC 21.9* 22.5* 16.5*  PLT 452* 407* 460*    COAGULATION No results for input(s): "INR" in the last 168 hours.  CARDIAC  No results for input(s): "TROPONINI" in the last 168 hours. No results for input(s): "PROBNP" in the last 168 hours.   CHEMISTRY Recent Labs  Lab 10/17/22 0419 10/19/22 0614 10/20/22 0429  NA 135 137 139  K 4.3 4.6 4.6  CL 98 104 104  CO2 26 24 23   GLUCOSE 139* 140* 137*  BUN 30* 30* 35*  CREATININE 0.78 0.70 0.76  CALCIUM 8.3* 8.9 8.9  MG  --   --  2.2  PHOS  --   --  4.1   Estimated Creatinine Clearance: 74.4 mL/min (by C-G formula based on SCr of 0.76 mg/dL).   LIVER Recent Labs  Lab 10/20/22 0429  AST 194*  ALT 251*  ALKPHOS 123   BILITOT 0.4  PROT 7.8  ALBUMIN 2.1*     INFECTIOUS No results for input(s): "LATICACIDVEN", "PROCALCITON" in the last 168 hours.   ENDOCRINE CBG (last 3)  Recent Labs    10/20/22 2327 10/21/22 0326 10/21/22 0840  GLUCAP 124* 133* 116*         IMAGING x48h  - image(s) personally visualized  -   highlighted in bold DG CHEST PORT 1 VIEW  Result Date: 10/20/2022 CLINICAL DATA:  Acute on chronic respiratory failure EXAM: PORTABLE CHEST 1 VIEW COMPARISON:  Chest x-ray October 12, 2022 FINDINGS: Bibasilar infiltrates persist but have improved in the interval. The ETT is in good position. The NG tube terminates below today's film. No pneumothorax. No other interval changes. IMPRESSION: Improving but persistent bibasilar infiltrates worrisome for pneumonia. Recommend follow-up imaging to ensure resolution. Electronically Signed   By: Dorise Bullion III M.D.   On: 10/20/2022 10:11

## 2022-10-21 NOTE — Progress Notes (Signed)
Daily Progress Note   Patient Name: Damon [name]       Date: 10/21/2022 DOB: 1961/02/27  Age: 62 y.o. MRN#: [id number] Attending Physician: Kalman Shan, MD Primary Care Physician: Center, [facility] Medical Admit Date: 10/03/2022  Reason for Consultation/Follow-up: Establishing goals of care  Patient Profile/HPI:  62 y/o male admitted to [facility] on 12/20 in setting of acute hypoxemic respiratory failure due to Flu A. Placed on BIPAP. Had a 5 minute PEA arrest, required intubation. After several days on mechanical ventilation remains comatose, MRI brain findings worrisome for anoxic brain injury. Moved to [facility] for LTM EEG monitoring.  Palliative care has been asked to get involved for further goals of care conversations in the setting of anoxic brain injury post arrest. 12/31- no improvements, prognosis has been discussed extensively with family by palliative team, neurology and PCCM providers. 1/2- f/u meeting with patient's spouse Damon [name]- she requested time and space as she grapples with processing eventual loss of spouse and how her life will be without him, she is not yet in a place to consider transition to comfort  Subjective: Chart reviewed including labs, progress notes, imaging from this and previous encounters.  No further myoclonus since stopping propofol.  Met with Damon [name] at bedside. She continues to endorse her hurt and anger and feelings that hospital system has let her and her husband down.  We discussed that unfortunately, it is time to make decisions.  Reviewed options of trach, peg, LTAC vs comfort.  Damon [name] is really struggling, somewhat paralyzed by her anger.  She requested follow-up meeting on Tuesday when patient's son, Damon [name], can also be present.   Review of Systems   Unable to perform ROS: Intubated     Physical Exam Vitals and nursing note reviewed.  Constitutional:      Appearance: He is ill-appearing.     Comments: Frail, diaphoretic  Cardiovascular:     Rate and Rhythm: Normal rate and regular rhythm.     Heart sounds: Normal heart sounds.  Pulmonary:     Breath sounds: Normal breath sounds.     Comments: intubated Neurological:     Comments: unresponsive             Vital Signs: BP (!) 132/93   Pulse (!) 109   Temp 98.4 F (36.9 C) (Axillary)   Resp  17   Ht 5\' 4"  (1.626 m)   Wt 53.6 kg   SpO2 100%   BMI 20.28 kg/m  SpO2: SpO2: 100 % O2 Device: O2 Device: Ventilator O2 Flow Rate:    Intake/output summary:  Intake/Output Summary (Last 24 hours) at 10/21/2022 1752 Last data filed at 10/21/2022 1600 Gross per 24 hour  Intake 1800 ml  Output 2225 ml  Net -425 ml    LBM: Last BM Date : 10/21/22 Baseline Weight: Weight: 64.9 kg Most recent weight: Weight: 53.6 kg       Palliative Assessment/Data: PPS: 10%      Patient Active Problem List   Diagnosis Date Noted   On mechanically assisted ventilation ([place]) 10/17/2022   Anoxic brain damage ([place]) 10/17/2022   Anoxic encephalopathy ([place]) 10/16/2022   Seizure ([place]) 10/16/2022   Pressure injury of skin 10/16/2022   Acute hypoxic respiratory failure ([place]) 10/03/2022   CAP (community acquired pneumonia) 10/03/2022   Influenza and pneumonia 10/03/2022   Elevated LFTs 10/03/2022   HTN (hypertension) 10/03/2022   HLD (hyperlipidemia) 10/03/2022   Tobacco abuse 10/03/2022   SIRS (systemic inflammatory response syndrome) ([place]) 10/03/2022   Influenza A 10/03/2022   Cardiac arrest ([place]) 10/03/2022   Tobacco dependence 10/03/2022    Palliative Care Assessment & Plan    Assessment/Recommendations/Plan  Continue current plan of care Plan for f/u meeting on Tuesday when patient's spouse, Damon can be present  Code Status: DNR  Prognosis:  Unable to  determine  Discharge Planning: To Be Determined  Care plan was discussed with patient's spouse- Anderson [name]  Thank you for allowing the Palliative Medicine Team to assist in the care of this patient.  Total time: 90 minutes  Greater than 50%  of this time was spent counseling and coordinating care related to the above assessment and plan.  [name], AGNP-C Palliative Medicine   Please contact Palliative Medicine Team phone at [phone] for questions and concerns.

## 2022-10-22 ENCOUNTER — Inpatient Hospital Stay (HOSPITAL_COMMUNITY): Payer: Medicare Other

## 2022-10-22 ENCOUNTER — Encounter (HOSPITAL_COMMUNITY): Payer: Self-pay | Admitting: Internal Medicine

## 2022-10-22 DIAGNOSIS — Z7189 Other specified counseling: Secondary | ICD-10-CM | POA: Diagnosis not present

## 2022-10-22 DIAGNOSIS — G931 Anoxic brain damage, not elsewhere classified: Secondary | ICD-10-CM | POA: Diagnosis not present

## 2022-10-22 DIAGNOSIS — J9601 Acute respiratory failure with hypoxia: Secondary | ICD-10-CM | POA: Diagnosis not present

## 2022-10-22 LAB — COMPREHENSIVE METABOLIC PANEL
ALT: 269 U/L — ABNORMAL HIGH (ref 0–44)
AST: 118 U/L — ABNORMAL HIGH (ref 15–41)
Albumin: 2.2 g/dL — ABNORMAL LOW (ref 3.5–5.0)
Alkaline Phosphatase: 111 U/L (ref 38–126)
Anion gap: 6 (ref 5–15)
BUN: 41 mg/dL — ABNORMAL HIGH (ref 6–20)
CO2: 22 mmol/L (ref 22–32)
Calcium: 8.9 mg/dL (ref 8.9–10.3)
Chloride: 111 mmol/L (ref 98–111)
Creatinine, Ser: 0.62 mg/dL (ref 0.61–1.24)
GFR, Estimated: >60 mL/min (ref >60)
Glucose, Bld: 119 mg/dL — ABNORMAL HIGH (ref 70–99)
Potassium: 4.4 mmol/L (ref 3.5–5.1)
Sodium: 139 mmol/L (ref 135–145)
Total Bilirubin: 0.5 mg/dL (ref 0.3–1.2)
Total Protein: 8.2 g/dL — ABNORMAL HIGH (ref 6.5–8.1)

## 2022-10-22 LAB — GLUCOSE, CAPILLARY
Glucose-Capillary: 105 mg/dL — ABNORMAL HIGH (ref 70–99)
Glucose-Capillary: 123 mg/dL — ABNORMAL HIGH (ref 70–99)
Glucose-Capillary: 125 mg/dL — ABNORMAL HIGH (ref 70–99)
Glucose-Capillary: 129 mg/dL — ABNORMAL HIGH (ref 70–99)
Glucose-Capillary: 136 mg/dL — ABNORMAL HIGH (ref 70–99)
Glucose-Capillary: 143 mg/dL — ABNORMAL HIGH (ref 70–99)

## 2022-10-22 LAB — CBC
HCT: 29.3 % — ABNORMAL LOW (ref 39.0–52.0)
Hemoglobin: 9.5 g/dL — ABNORMAL LOW (ref 13.0–17.0)
MCH: 28.1 pg (ref 26.0–34.0)
MCHC: 32.4 g/dL (ref 30.0–36.0)
MCV: 86.7 fL (ref 80.0–100.0)
Platelets: 530 10*3/uL — ABNORMAL HIGH (ref 150–400)
RBC: 3.38 MIL/uL — ABNORMAL LOW (ref 4.22–5.81)
RDW: 16.6 % — ABNORMAL HIGH (ref 11.5–15.5)
WBC: 15.1 10*3/uL — ABNORMAL HIGH (ref 4.0–10.5)
nRBC: 0 % (ref 0.0–0.2)

## 2022-10-22 LAB — MAGNESIUM: Magnesium: 2.2 mg/dL (ref 1.7–2.4)

## 2022-10-22 LAB — PHOSPHORUS: Phosphorus: 4 mg/dL (ref 2.5–4.6)

## 2022-10-22 MED ORDER — CARVEDILOL 12.5 MG PO TABS
25.0000 mg | ORAL_TABLET | Freq: Two times a day (BID) | ORAL | Status: DC
  Administered 2022-10-22: 25 mg
  Filled 2022-10-22: qty 2

## 2022-10-22 MED ORDER — BANATROL TF EN LIQD
60.0000 mL | Freq: Two times a day (BID) | ENTERAL | Status: DC
  Administered 2022-10-22 – 2022-11-15 (×48): 60 mL
  Filled 2022-10-22 (×48): qty 60

## 2022-10-22 MED ORDER — METOPROLOL TARTRATE 25 MG/10 ML ORAL SUSPENSION
25.0000 mg | Freq: Three times a day (TID) | ORAL | Status: DC
  Administered 2022-10-22 – 2022-10-23 (×5): 25 mg
  Filled 2022-10-22 (×5): qty 10

## 2022-10-22 MED ORDER — LACTATED RINGERS IV BOLUS
500.0000 mL | Freq: Once | INTRAVENOUS | Status: AC
  Administered 2022-10-22: 500 mL via INTRAVENOUS

## 2022-10-22 NOTE — Progress Notes (Signed)
Daily Progress Note   Patient Name: Damon [name]       Date: 10/22/2022 DOB: [birth year]  Age: 62 y.o. MRN#: [id number] Attending Physician: Lynnell Catalan, MD Primary Care Physician: Center, [facility] Medical Admit Date: 10/03/2022  Reason for Consultation/Follow-up: Establishing goals of care  Patient Profile/HPI:  62 y/o male admitted to [facility] on 12/20 in setting of acute hypoxemic respiratory failure due to Flu A. Placed on BIPAP. Had a 5 minute PEA arrest, required intubation. After several days on mechanical ventilation remains comatose, MRI brain findings worrisome for anoxic brain injury. Moved to [facility] for LTM EEG monitoring.  Palliative care has been asked to get involved for further goals of care conversations in the setting of anoxic brain injury post arrest. 12/31- no improvements, prognosis has been discussed extensively with family by palliative team, neurology and PCCM providers. 1/2- f/u meeting with patient's spouse [name]- she requested time and space as she grapples with processing eventual loss of spouse and how her life will be without him, she is not yet in a place to consider transition to comfort 1/7- propofol dc'd 1/5, no further myoclonus, no other sedating meds, discussed with spouse that decisions are needing to be made re: trach/PEG/LTC vs comfort - see 1/7 note  Subjective: Chart reviewed including labs, progress notes, imaging from this and previous encounters.  [name] and daughter Scientist, research (life sciences) at bedside.  Met with family and Palliative NP Georgiann Cocker also present.  They shared that patient has a Grandson who has autism who is struggling with his Grandfather's absence. They have tried photos, video calls, but his absence and this situation is affecting his school  behavior. Counselors at school have recommended an in person visit.  Patient's son is coming to town and will be here until Saturday.  [name] is concerned about Lonce's initial response to seeing his Dad's state and worries he will be very emotional.  [name] did express her understanding that we have arrived at a point where decisions need to be made.    Review of Systems  Unable to perform ROS: Intubated     Physical Exam Vitals and nursing note reviewed.  Constitutional:      Appearance: He is ill-appearing.     Comments: Frail, diaphoretic  Cardiovascular:     Rate and Rhythm: Normal rate and regular rhythm.  Heart sounds: Normal heart sounds.  Pulmonary:     Breath sounds: Normal breath sounds.     Comments: intubated Neurological:     Comments: Eyes are open, no tracking, no response to stimuli             Vital Signs: BP 123/79   Pulse (!) 122   Temp 99.5 F (37.5 C) (Axillary)   Resp 20   Ht 5\' 4"  (1.626 m)   Wt 55 kg   SpO2 100%   BMI 20.81 kg/m  SpO2: SpO2: 100 % O2 Device: O2 Device: Ventilator O2 Flow Rate:    Intake/output summary:  Intake/Output Summary (Last 24 hours) at 10/22/2022 1311 Last data filed at 10/22/2022 1200 Gross per 24 hour  Intake 1701.55 ml  Output 2050 ml  Net -348.45 ml    LBM: Last BM Date : 10/22/22 Baseline Weight: Weight: 64.9 kg Most recent weight: Weight: 55 kg       Palliative Assessment/Data: PPS: 10%      Patient Active Problem List   Diagnosis Date Noted   Advanced care planning/counseling discussion 10/21/2022   On mechanically assisted ventilation ([place]) 10/17/2022   Anoxic brain damage ([place]) 10/17/2022   Anoxic encephalopathy ([place]) 10/16/2022   Seizure ([place]) 10/16/2022   Pressure injury of skin 10/16/2022   Acute hypoxic respiratory failure ([place]) 10/03/2022   CAP (community acquired pneumonia) 10/03/2022   Influenza and pneumonia 10/03/2022   Elevated LFTs 10/03/2022   HTN (hypertension)  10/03/2022   HLD (hyperlipidemia) 10/03/2022   Tobacco abuse 10/03/2022   SIRS (systemic inflammatory response syndrome) ([place]) 10/03/2022   Influenza A 10/03/2022   Cardiac arrest ([place]) 10/03/2022   Tobacco dependence 10/03/2022    Palliative Care Assessment & Plan    Assessment/Recommendations/Plan  Continue current plan of care Plan for patient and son to visit tomorrow with PMT support for answering questions, [name] plans to speak to son tomorrow night re: decisions, then plan for f/u meeting on Wednesday with [name] and son for more in depth decision making discussion Requested exception to visitor policy for underage visitor to allow for Grandson to visit- bedside RN to discuss with Unit Director Strategies for discussing serious illness with children reviewed with Sherrell, contact information for [facility] given  Code Status: DNR  Prognosis:  Unable to determine  Discharge Planning: To Be Determined  Care plan was discussed with patient's spouse- [name]  Thank you for allowing the Palliative Medicine Team to assist in the care of this patient.  Total time: 60 minutes  Greater than 50%  of this time was spent counseling and coordinating care related to the above assessment and plan.  [name], AGNP-C Palliative Medicine   Please contact Palliative Medicine Team phone at [phone] for questions and concerns.

## 2022-10-22 NOTE — Progress Notes (Signed)
Addendum to previous encounter- messaged from RN that per Unit Director- family's requested exception to visitation policy cannot be made for patient's Grandson who has autism at this time.  Called patient's daughter and relayed decision to her.   [name], AGNP-C Palliative Medicine  No charge

## 2022-10-22 NOTE — Progress Notes (Signed)
[place] Progress Note Patient Name: Damon [name] DOB: [birth year] MRN: [id number]   Date of Service  10/22/2022  HPI/Events of Note  Notified of HR 121. SBP in 120s. O2 98% on 40%/peep 5. No distress on camera. Low grade fevers during the day.   eICU Interventions  Will try a LR bolus 500 cc over 1 hour      Intervention Category Major Interventions: Respiratory failure - evaluation and management  Damon [name] 10/22/2022, 2:33 AM

## 2022-10-22 NOTE — Progress Notes (Signed)
NAME:  Damon [name], MRN:  [id number], DOB:  08-28-1961, LOS: 52 ADMISSION DATE:  10/03/2022, CONSULTATION DATE:  10/03/2022 REFERRING MD:  Marylyn Ishihara - TR[facility] CHIEF COMPLAINT:  Dyspnea   History of Present Illness:  62 year old man admitted to [facility] 12/20 in the setting of acute hypoxemic respiratory failure due to Flu A. PMHx significant for   Placed on BIPAP.  Coded (likely in the setting of hypoxia), initial rhythm PEA with CPR x 5 minutes, required intubation.  After several days on mechanical ventilation remains comatose, MRI Brain 12/26 worrisome for anoxic brain injury.  Transferred to [facility] for LTM EEG monitoring.  Pertinent Medical History:   Past Medical History:  Diagnosis Date   Elevated lipids    Hypertension    Tobacco abuse    Significant Hospital Events: Including procedures, antibiotic start and stop dates in addition to other pertinent events   12/18 Flu A positive 12/20 Admitted, 5 minute code after non-compliance with BiPAP MRSA PCR - neg Urine strep _POS 12/21 Intubated, paralyzed, ARDS protocol, on 7cc/kg 12/22 Weaned to 6 cc/kg with dyssynchrony, back on 7 cc/kg, driving pressures good, weaning vent 12/23 Attempt to wean sedation again with dyssynchrony and desaturation.  Some concern for cuff leak, tube exchange.  Cuff did not appear to be blown.  Ongoing signs of cuff leak, query leaking around cough, possible large trachea, diuresing 12/25 tolerating PSV, myoclonus> ceribell, Neuro consult, changed to propofol, diuresed  12/26 Changed to DNR. On Propofol, versed.  Tolerating PSV.  MRI brain with hypoxic/anoxic injury Blood culture neg 12/27 GPD's on EEG, pending transfer to [facility] for cEEG NEURO CONSULT 12/28 moved to [facility] for LTM EEG 12/31 family updated by neuro: no chance for meaningful recovery, they should make plans for comfort measures 1/1 cultures - resp normal flora 1/2 eeg still with epileptogenicity. Palli fam mtg -- family does not want to hear from medical  teams unless there is "good news"  1/3  cEEG with burst suppression w highly epileptiform bursts.  1/4 cEEG with burst suppression  + highly epileptiform bursts still.  WBC slightly up to 22.5. Temp high 99 low 100  c-EEG stopped duie to lack of benefit Pallative care consult - wife upset about early dc from ER on 12/18 Chaplain consult: Wife is sufering from anticipatory grief + accepting god's way + curious/anger about events leading upto current state EEG: generalized epileptogenicity with high potential for seizures as well as profound diffuse encephalopathy. In the setting of cardiac arrest, this EEG pattern is suggestive of anoxic/hypoxic brain injury.  1/5 - Low grade fever +. [place] 22.5K.  on Zosyn. On vent fio2 30%. TF +. Per RN - diprivan stopped yesterday. No myoclonus today. Mild tachycardia + Rpt pall care on 10/21/22 per wife 1/6 - On vent FiO2 30%.  Tmax 99,71F ., WBC better. Still off diprivan -> No myoclonus. cXR visualized - devices in place and faiure clear Oral oxy and klonpin stopped 1/8 No significant changes in neuro status. Tachycardic to 130s, hypertensive to 140s. Coreg transitioned to metoprolol.  Interim History / Subjective:  No significant events overnight Neuro exam grossly unchanged Tachycardic 130s, SBP 140s Weaned on vent PSV 8/5, fatiguing LFTs remain mildly elevated, trending  Objective:  Blood pressure (!) 138/99, pulse (!) 117, temperature 99.1 F (37.3 C), temperature source Oral, resp. rate 18, height 5\' 4"  (1.626 m), weight 55 kg, SpO2 100 %.    Vent Mode: CPAP;PSV FiO2 (%):  [30 %] 30 % PEEP:  [5  cmH20] 5 cmH20 Pressure Support:  [8 cmH20] 8 cmH20   Intake/Output Summary (Last 24 hours) at 10/22/2022 0830 Last data filed at 10/22/2022 0700 Gross per 24 hour  Intake 1651.55 ml  Output 1650 ml  Net 1.55 ml   Filed Weights   10/20/22 0500 10/21/22 0422 10/22/22 0500  Weight: 55.7 kg 53.6 kg 55 kg   Physical Examination: General: Acutely  ill-appearing middle aged man in NAD. HEENT: [place]/AT, anicteric sclera, PERRL 67mm, moist mucous membranes. ETT/OGT in place. Neuro:  Eyes open, blinking, but patient is not alert.  Does not respond to verbal, tactile or noxious stimuli. Not following commands. No spontaneous movement of extremities noted. +Corneal, +Cough, and +Gag  CV: Tachycardic, regular rhythm, no m/g/r. PULM: Breathing even and mildly labored on vent (PEEP 5, FiO2 30%; weaned PSV 8/5). Lung fields coarse throughout. GI: Soft, nontender, nondistended. Normoactive bowel sounds. Extremities: No  LE edema noted. Skin: Warm/dry, no rashes.  Resolved Hospital Problem List:  AKI Hypernatremia ARDS   Assessment & Plan:   Acute respiratory failure with hypoxia/ARDS - Flu and strep pneumo PNA; Sp Tamiflu and Abx - Continue full vent support (4-8cc/kg IBW) - Wean FiO2 for O2 sat > 90% - Daily WUA/SBT, extubation currently precluded by mental status - VAP bundle - Pulmonary hygiene - PAD protocol for sedation: Goal RASS 0 to -1, off sedation at present - Off of Zosyn since 1/7 (see below) - Ongoing GOC discussion re: tracheostomy, would likely need PEG for LTACH  Flu A CAP, pneumococcal pneumonia -  s/p Rx. S/p Zosyn 12/31 - 1/7; Ceftriaxone 12/20- 12/27, Azithro 12/20, Flagyl 12/20 - 12/22 S/p Tamiflu 12/21 - 12/25 - Follow CXR  Cardiac arrest Anoxic encephalopathy with Seizure  - Evidence on MRI and EEG off diprivan  and c-EENG since 1/4. Off schduled oxycodone sine 1/6 and klonopin since 1/6: No visible myoclonus. Comatose +, no meaningful neuro changes. - AEDs (Keppra, valproic acid) - Remains off of Propofol - cEEG discontinued, no further seizures  Leukocytosis - CVC and foley were dc 1/3 . Zosyn 7d complete 10/21/22 Anemia, stable  - Trend CBC, fever curve - Monitor for signs of active bleeding - Transfuse for Hgb < 7.0 or hemodynamically significant bleeding  HTN - Transition Coreg to Metoprolol for better  HR control - Hydralazine PRN - Cardiac monitoring  Severe protein calorie malnutrition - Nutrition following, appreciate recs - TF via gastric tube  Hyperglycemia - CBGs Q4H - Goal CBG 140-180  GOC DNR status Care Giver Distress + (anticiptory grief + upset at outcome +) Remains comatose. Patient's wife is understandably in great distress over patient's clinical status. Patient's grandson who has autism is struggling greatly with his grandfather's absence. Appreciate Palliative team's input and assistance with management/GOC conversations. - DNR code status at present - Ongoing discussion re: patient and family's wishes in terms of tracheostomy, PEG, LTACH - Awaiting more family members (son) for more in-depth decision making  Best Practice (right click and "Reselect all SmartList Selections" daily)   Diet/type: tubefeeds DVT prophylaxis: LMWH GI prophylaxis: PPI Lines: n/a Foley:  N/A Code Status:  DNR Last date of multidisciplinary goals of care discussion: [PMT meeting with family 1/7]  Critical care time: 41 minutes   The patient is critically ill with multiple organ system failure and requires high complexity decision making for assessment and support, frequent evaluation and titration of therapies, advanced monitoring, review of radiographic studies and interpretation of complex data.   Critical Care Time devoted  to patient care services, exclusive of separately billable procedures, described in this note is 41 minutes.  Tim Lair, PA-C [facility] 10/22/22 8:30 AM  Please see [email or web address] for pager details.  From 7A-7P if no response, please call [phone] After hours, please call ELink [phone]

## 2022-10-23 ENCOUNTER — Inpatient Hospital Stay (HOSPITAL_COMMUNITY): Payer: Medicare Other

## 2022-10-23 DIAGNOSIS — J9601 Acute respiratory failure with hypoxia: Secondary | ICD-10-CM | POA: Diagnosis not present

## 2022-10-23 DIAGNOSIS — Z515 Encounter for palliative care: Secondary | ICD-10-CM | POA: Diagnosis not present

## 2022-10-23 DIAGNOSIS — J101 Influenza due to other identified influenza virus with other respiratory manifestations: Secondary | ICD-10-CM | POA: Diagnosis not present

## 2022-10-23 LAB — CBC
HCT: 31.6 % — ABNORMAL LOW (ref 39.0–52.0)
Hemoglobin: 9.9 g/dL — ABNORMAL LOW (ref 13.0–17.0)
MCH: 27.7 pg (ref 26.0–34.0)
MCHC: 31.3 g/dL (ref 30.0–36.0)
MCV: 88.3 fL (ref 80.0–100.0)
Platelets: 550 10*3/uL — ABNORMAL HIGH (ref 150–400)
RBC: 3.58 MIL/uL — ABNORMAL LOW (ref 4.22–5.81)
RDW: 16.5 % — ABNORMAL HIGH (ref 11.5–15.5)
WBC: 14.8 10*3/uL — ABNORMAL HIGH (ref 4.0–10.5)
nRBC: 0 % (ref 0.0–0.2)

## 2022-10-23 LAB — GLUCOSE, CAPILLARY
Glucose-Capillary: 128 mg/dL — ABNORMAL HIGH (ref 70–99)
Glucose-Capillary: 140 mg/dL — ABNORMAL HIGH (ref 70–99)
Glucose-Capillary: 122 mg/dL — ABNORMAL HIGH (ref 70–99)
Glucose-Capillary: 121 mg/dL — ABNORMAL HIGH (ref 70–99)
Glucose-Capillary: 137 mg/dL — ABNORMAL HIGH (ref 70–99)

## 2022-10-23 LAB — PHOSPHORUS: Phosphorus: 3.9 mg/dL (ref 2.5–4.6)

## 2022-10-23 LAB — COMPREHENSIVE METABOLIC PANEL
ALT: 456 U/L — ABNORMAL HIGH (ref 0–44)
AST: 196 U/L — ABNORMAL HIGH (ref 15–41)
Albumin: 2.4 g/dL — ABNORMAL LOW (ref 3.5–5.0)
Alkaline Phosphatase: 118 U/L (ref 38–126)
Anion gap: 9 (ref 5–15)
BUN: 50 mg/dL — ABNORMAL HIGH (ref 6–20)
CO2: 23 mmol/L (ref 22–32)
Calcium: 9.4 mg/dL (ref 8.9–10.3)
Chloride: 108 mmol/L (ref 98–111)
Creatinine, Ser: 0.7 mg/dL (ref 0.61–1.24)
GFR, Estimated: >60 mL/min (ref >60)
Glucose, Bld: 131 mg/dL — ABNORMAL HIGH (ref 70–99)
Potassium: 4.3 mmol/L (ref 3.5–5.1)
Sodium: 140 mmol/L (ref 135–145)
Total Bilirubin: 0.4 mg/dL (ref 0.3–1.2)
Total Protein: 8.8 g/dL — ABNORMAL HIGH (ref 6.5–8.1)

## 2022-10-23 LAB — MAGNESIUM: Magnesium: 2.3 mg/dL (ref 1.7–2.4)

## 2022-10-23 MED ORDER — OSMOLITE 1.5 CAL PO LIQD
1000.0000 mL | ORAL | Status: DC
  Administered 2022-10-23 – 2022-10-29 (×8): 1000 mL

## 2022-10-23 MED ORDER — METOPROLOL TARTRATE 5 MG/5ML IV SOLN
2.5000 mg | INTRAVENOUS | Status: DC | PRN
  Administered 2022-10-24 – 2022-10-26 (×8): 2.5 mg via INTRAVENOUS
  Filled 2022-10-23 (×9): qty 5

## 2022-10-23 NOTE — Progress Notes (Signed)
Pt noted to have HR 127-130 resting. Pt's dBP has been on the high side. Elink RN informed to inform the MD on-call.

## 2022-10-23 NOTE — Progress Notes (Signed)
Nutrition Follow-up  DOCUMENTATION CODES:   Not applicable  INTERVENTION:   Tube feeding via OG tube:  D/C Vital 1.5   Osmolite 1.5 at 55 ml/h (1320 ml per day) Prosource TF20 60 ml BID  Provides 2140 kcal, 122 gm protein, 1003 ml free water daily  NUTRITION DIAGNOSIS:   Inadequate oral intake related to acute illness (respiratory failure/ventilated) as evidenced by NPO status. Ongoing.   GOAL:   Patient will meet greater than or equal to 90% of their needs Met with TF at goal.   MONITOR:   Vent status, Labs, Weight trends, TF tolerance  REASON FOR ASSESSMENT:   Consult Enteral/tube feeding initiation and management  ASSESSMENT:   Pt admitted from home with the flu leading to acute respiratory failure with hypoxia and PEA arrest upon admission. PMH significant for HTN, HLD, chronic back pain, tobacco use.  Pt discussed during ICU rounds and with RN.   CCM discussing [place] with family due to severe anoxic injury. Palliative care following.   Medications reviewed and include: pepcid, banatrol TF  Labs reviewed:  AST: 196 ALT: 456 CBG's: 122-140  Current weight: 53.1 kg Admission weight: 64.9 kg    Diet Order:   Diet Order             Diet NPO time specified  Diet effective now                   EDUCATION NEEDS:   No education needs have been identified at this time  Skin:  Skin Assessment: Skin Integrity Issues: Skin Integrity Issues:: Stage II Stage II: sacrum  Last BM:  800 ml via FMS  Height:   Ht Readings from Last 1 Encounters:  10/11/22 5\' 4"  (1.626 m)    Weight:   Wt Readings from Last 1 Encounters:  10/23/22 53.1 kg   BMI:  Body mass index is 20.09 kg/m.  Estimated Nutritional Needs:   Kcal:  1900-2100  Protein:  95-110g  Fluid:  >/=1.9L  Earle Troiano P., RD, LDN, CNSC See AMiON for contact information

## 2022-10-23 NOTE — Progress Notes (Addendum)
eLink Physician-Brief Progress Note Patient Name: Damon [name] DOB: 1961-08-05 MRN: [id number]   Date of Service  10/23/2022  HPI/Events of Note  Notified of tachycardia and high DBP 147/104 HR 130  On scheduled metoprolol and has PRN hydralazine.  Persistently tachycardic all day, they changed his coreg to metoprolol in hopes to improve it.  No reported fever  eICU Interventions  This could be central in etiology. Will defer to bedside rounding team in AM if a head CT would be appropriate given prognosis and ongoing discussion regarding regarding goals of care Discussed with bedside RN     Intervention Category Intermediate Interventions: Hypertension - evaluation and management;Other:  Judd Lien 10/23/2022, 3:18 AM

## 2022-10-23 NOTE — Progress Notes (Signed)
Spoke w/ MD on-call Elink about BP and HR. No new orders.

## 2022-10-23 NOTE — Progress Notes (Signed)
[place] Progress Note Patient Name: Damon [name] DOB: Dec 30, 1960 MRN: [id number]   Date of Service  10/23/2022  HPI/Events of Note  Sinus Tachycardia - HR  = 130. BP = 164/103.  eICU Interventions  Plan: Metoprolol 2.5 mg IV Q 3 hours PRN HR > 115. Hold dose for SBP < 105.     Intervention Category Major Interventions: Arrhythmia - evaluation and management  [name] Eugene 10/23/2022, 11:49 PM

## 2022-10-23 NOTE — Progress Notes (Signed)
Palliative Care Progress Note, Assessment & Plan   Patient Name: Damon [name]       Date: 10/23/2022 DOB: [birth year]  Age: 62 y.o. MRN#: [id number] Attending Physician: Kipp Brood, MD Primary Care Physician: [facility] Medical Admit Date: 10/03/2022  Subjective: Patient is lying in bed intubated. No family at bedside. Does not move any extremities to command. Does not track or blink though eyes remain open.   HPI: 62 y/o male admitted to [facility] on 12/20 in setting of acute hypoxemic respiratory failure due to Flu A. Placed on BIPAP. Had a 5 minute PEA arrest, required intubation. After several days on mechanical ventilation remains comatose, MRI brain findings worrisome for anoxic brain injury. Moved to [facility] for LTM EEG monitoring.  Palliative care has been asked to get involved for further goals of care conversations in the setting of anoxic brain injury post arrest. 12/31- no improvements, prognosis has been discussed extensively with family by palliative team, neurology and PCCM providers. 1/2- f/u meeting with patient's spouse [name]- she requested time and space as she grapples with processing eventual loss of spouse and how her life will be without him, she is not yet in a place to consider transition to comfort 1/7- propofol dc'd 1/5, no further myoclonus, no other sedating meds, discussed with spouse that decisions are needing to be made re: trach/PEG/LTC vs comfor  Summary of counseling/coordination of care: After reviewing the patient's chart and assessing the patient at bedside, I spoke with patient's spouse [name] over the phone.  She plans to await arrival of her son before coming to the hospital.  She is hopeful that son will be able to hear medical update and assist her with deciding on  whether or not to move forward with trach/PEG.  [name] expressed concerns that patient's diarrhea would be so depleting that he may not have the strength to survive tracheostomy placement.  Discussed use of tube feeds as contributing to diarrhea.  She shares that with rectal tube in place she is hopeful that diarrhea will not impact patient.  We discussed that patient's other comorbidities are contributing to his overall poor status.  She appreciates my input and honesty, and is not prepared to make a decision at this time.  She shares she is unsure if she will be able to get to the hospital today due to to severe weather.  However, if her son is able to make it to [facility] she is hopeful she will be here.  Requested that RN contact me if/when family comes to bedside today.  PMT will continue to follow.   Physical Exam Vitals reviewed.  Constitutional:      General: He is not in acute distress. Cardiovascular:     Rate and Rhythm: Tachycardia present.  Pulmonary:     Comments: MV Abdominal:     Palpations: Abdomen is soft.  Musculoskeletal:     Comments: Does not MAETC  Skin:    General: Skin is warm and dry.             Palliative Assessment/Data: 20%    Total Time 30 minutes   Thank you for allowing the Palliative Medicine Team to assist in the care of  this patient.  Ceylon Ilsa Iha, FNP-BC Palliative Medicine Team Team Phone # [phone]

## 2022-10-23 NOTE — Progress Notes (Signed)
Sputum sample obtained and sent to lab.  

## 2022-10-23 NOTE — Progress Notes (Signed)
NAME:  Damon [name], MRN:  [id number], DOB:  05-03-1961, LOS: 20 ADMISSION DATE:  10/03/2022, CONSULTATION DATE:  10/03/2022 REFERRING MD:  Ronaldo Miyamoto - [facility] CHIEF COMPLAINT:  Dyspnea   History of Present Illness:  62 year old man admitted to [facility] 12/20 in the setting of acute hypoxemic respiratory failure due to Flu A. PMHx significant for   Placed on BIPAP.  Coded (likely in the setting of hypoxia), initial rhythm PEA with CPR x 5 minutes, required intubation.  After several days on mechanical ventilation remains comatose, MRI Brain 12/26 worrisome for anoxic brain injury.  Transferred to [facility] for LTM EEG monitoring.  Pertinent Medical History:   Past Medical History:  Diagnosis Date   Elevated lipids    Hypertension    Tobacco abuse    Significant Hospital Events: Including procedures, antibiotic start and stop dates in addition to other pertinent events   12/18 Flu A positive 12/20 Admitted, 5 minute code after non-compliance with BiPAP MRSA PCR - neg Urine strep _POS 12/21 Intubated, paralyzed, ARDS protocol, on 7cc/kg 12/22 Weaned to 6 cc/kg with dyssynchrony, back on 7 cc/kg, driving pressures good, weaning vent 12/23 Attempt to wean sedation again with dyssynchrony and desaturation.  Some concern for cuff leak, tube exchange.  Cuff did not appear to be blown.  Ongoing signs of cuff leak, query leaking around cough, possible large trachea, diuresing 12/25 tolerating PSV, myoclonus> ceribell, Neuro consult, changed to propofol, diuresed  12/26 Changed to DNR. On Propofol, versed.  Tolerating PSV.  MRI brain with hypoxic/anoxic injury Blood culture neg 12/27 GPD's on EEG, pending transfer to [facility] for cEEG NEURO CONSULT 12/28 moved to [facility] for LTM EEG 12/31 family updated by neuro: no chance for meaningful recovery, they should make plans for comfort measures 1/1 cultures - resp normal flora 1/2 eeg still with epileptogenicity. Palli fam mtg -- family does not want to hear from medical  teams unless there is "good news"  1/3  cEEG with burst suppression w highly epileptiform bursts.  1/4 cEEG with burst suppression  + highly epileptiform bursts still.  WBC slightly up to 22.5. Temp high 99 low 100  c-EEG stopped duie to lack of benefit Pallative care consult - wife upset about early dc from ER on 12/18 Chaplain consult: Wife is sufering from anticipatory grief + accepting god's way + curious/anger about events leading upto current state EEG: generalized epileptogenicity with high potential for seizures as well as profound diffuse encephalopathy. In the setting of cardiac arrest, this EEG pattern is suggestive of anoxic/hypoxic brain injury.  1/5 - Low grade fever +. WEBC 22.5K.  on Zosyn. On vent fio2 30%. TF +. Per RN - diprivan stopped yesterday. No myoclonus today. Mild tachycardia + Rpt pall care on 10/21/22 per wife 1/6 - On vent FiO2 30%.  Tmax 99,46F ., WBC better. Still off diprivan -> No myoclonus. cXR visualized - devices in place and faiure clear Oral oxy and klonpin stopped 1/8 No significant changes in neuro status. Tachycardic to 130s, hypertensive to 140s. Coreg transitioned to metoprolol.  Interim History / Subjective:  Condition is unchanged.  Still tachycardic - switched to metoprolol Family met with PMT, no decision yet made regarding tracheostomy and PEG tube.   Objective:  Blood pressure (!) 138/92, pulse (!) 129, temperature 98.4 F (36.9 C), temperature source Axillary, resp. rate 19, height 5\' 4"  (1.626 m), weight 53.1 kg, SpO2 98 %.    Vent Mode: PRVC FiO2 (%):  [30 %] 30 % Set Rate:  [  15 bmp] 15 bmp Vt Set:  [550 mL] 550 mL PEEP:  [5 cmH20] 5 cmH20 Plateau Pressure:  [14 cmH20-16 cmH20] 16 cmH20   Intake/Output Summary (Last 24 hours) at 10/23/2022 0813 Last data filed at 10/23/2022 0800 Gross per 24 hour  Intake 1200 ml  Output 1900 ml  Net -700 ml    Filed Weights   10/21/22 0422 10/22/22 0500 10/23/22 0400  Weight: 53.6 kg 55 kg 53.1 kg    Physical Examination: General: Acutely ill-appearing middle aged man in NAD. Muscle wasting noted HEENT: [place]/AT, anicteric sclera, PERRL 23mm, moist mucous membranes. ETT/OGT in place. Neuro:  Eyes open, blinking, but patient is not alert.  Does not respond to verbal, tactile or noxious stimuli. Not following commands. No spontaneous movement of extremities noted. +Corneal, +Cough, and +Gag  CV: Tachycardic, regular rhythm, no m/g/r. PULM: Breathing even and mildly labored on vent (PEEP 5, FiO2 30%; weaned PSV 8/5). Lung fields coarse throughout. GI: Soft, nontender, nondistended. Normoactive bowel sounds. Extremities: No  LE edema noted. Skin: Warm/dry, no rashes.  Resolved Hospital Problem List:  AKI Hypernatremia ARDS   Assessment & Plan:   Acute respiratory failure with hypoxia/ARDS - Flu and strep pneumo PNA; Sp Tamiflu and Abx - Daily WUA/SBT, extubation currently precluded by mental status - VAP bundle - PAD protocol for sedation: Goal RASS 0 to -1, off sedation at present - Ongoing GOC discussion re: tracheostomy, would likely need PEG for LTACH - now would be time.   Flu A CAP, pneumococcal pneumonia -  s/p Rx. S/p Zosyn 12/31 - 1/7; Ceftriaxone 12/20- 12/27, Azithro 12/20, Flagyl 12/20 - 12/22  Cardiac arrest Anoxic encephalopathy with Seizure  - Evidence on MRI and EEG off diprivan  and c-EENG since 1/4. Off schduled oxycodone sine 1/6 and klonopin since 1/6: No visible myoclonus. Comatose +, no meaningful neuro changes. - AEDs (Keppra, valproic acid) - Remains off of Propofol - Prognosis is poor given limited progress now > 10 days following arrest.   Leukocytosis - CVC and foley were dc 1/3 . Zosyn 7d complete 10/21/22 Anemia, stable  - Trend CBC, fever curve - Monitor for signs of active bleeding - Transfuse for Hgb < 7.0 or hemodynamically significant bleeding  HTN - Transition Coreg to Metoprolol for better HR control - Hydralazine PRN - Cardiac  monitoring  Severe protein calorie malnutrition - Nutrition following, appreciate recs - TF via gastric tube  Hyperglycemia - CBGs Q4H - Goal CBG 140-180  GOC DNR status Care Giver Distress + (anticiptory grief + upset at outcome +) Remains comatose. Patient's wife is understandably in great distress over patient's clinical status. Patient's grandson who has autism is struggling greatly with his grandfather's absence. Appreciate Palliative team's input and assistance with management/GOC conversations. - DNR code status at present - Ongoing discussion re: patient and family's wishes in terms of tracheostomy, PEG, LTACH - Awaiting more family members (son) for more in-depth decision making  Best Practice (right click and "Reselect all SmartList Selections" daily)   Diet/type: tubefeeds DVT prophylaxis: LMWH GI prophylaxis: PPI Lines: n/a Foley:  N/A Code Status:  DNR Last date of multidisciplinary goals of care discussion: [PMT meeting with family 1/7]   The patient is critically ill with multiple organ system failure and requires high complexity decision making for assessment and support, frequent evaluation and titration of therapies, advanced monitoring, review of radiographic studies and interpretation of complex data.   Critical Care Time devoted to patient care services, exclusive of separately  billable procedures, described in this note is 35 minutes.  Kipp Brood, MD [facility] 10/23/22 8:13 AM  Please see [email or web address] for pager details.  From 7A-7P if no response, please call [phone] After hours, please call ELink [phone]

## 2022-10-23 NOTE — Progress Notes (Signed)
Pt noted to have been ST all day shift and up til this time. Pt's BP elevated w/ prn Hydralazine for SBP >170. Pt has not needed this prn due to parameters. This RN contacted Elink to relay this HTN and HR concern to Dr. Emmit Alexanders in hopes of some type of intervention.

## 2022-10-24 DIAGNOSIS — J9601 Acute respiratory failure with hypoxia: Secondary | ICD-10-CM

## 2022-10-24 DIAGNOSIS — J101 Influenza due to other identified influenza virus with other respiratory manifestations: Secondary | ICD-10-CM | POA: Diagnosis not present

## 2022-10-24 DIAGNOSIS — Z515 Encounter for palliative care: Secondary | ICD-10-CM

## 2022-10-24 LAB — BASIC METABOLIC PANEL
Anion gap: 9 (ref 5–15)
BUN: 48 mg/dL — ABNORMAL HIGH (ref 6–20)
CO2: 25 mmol/L (ref 22–32)
Calcium: 9.3 mg/dL (ref 8.9–10.3)
Chloride: 108 mmol/L (ref 98–111)
Creatinine, Ser: 0.71 mg/dL (ref 0.61–1.24)
GFR, Estimated: >60 mL/min (ref >60)
Glucose, Bld: 134 mg/dL — ABNORMAL HIGH (ref 70–99)
Potassium: 4.6 mmol/L (ref 3.5–5.1)
Sodium: 142 mmol/L (ref 135–145)

## 2022-10-24 LAB — GLUCOSE, CAPILLARY
Glucose-Capillary: 112 mg/dL — ABNORMAL HIGH (ref 70–99)
Glucose-Capillary: 126 mg/dL — ABNORMAL HIGH (ref 70–99)
Glucose-Capillary: 129 mg/dL — ABNORMAL HIGH (ref 70–99)
Glucose-Capillary: 131 mg/dL — ABNORMAL HIGH (ref 70–99)
Glucose-Capillary: 133 mg/dL — ABNORMAL HIGH (ref 70–99)
Glucose-Capillary: 139 mg/dL — ABNORMAL HIGH (ref 70–99)
Glucose-Capillary: 140 mg/dL — ABNORMAL HIGH (ref 70–99)

## 2022-10-24 MED ORDER — PROPRANOLOL HCL 40 MG PO TABS
40.0000 mg | ORAL_TABLET | Freq: Three times a day (TID) | ORAL | Status: DC
  Administered 2022-10-24 – 2022-10-27 (×10): 40 mg
  Filled 2022-10-24 (×10): qty 1

## 2022-10-24 NOTE — Consult Note (Signed)
Damon [name] 03-25-1961  [id number].    Requesting MD: Dr. Kipp Brood Chief Complaint/Reason for Consult: Trach/PEG consult  HPI: Damon [name] is a 62 y.o. male admitted to [facility] on 12/20 in setting of acute hypoxemic respiratory failure due to Flu A. Placed on BIPAP. Had PEA arrest, requiring CPR x 5 min and required intubation. After several days on mechanical ventilation he remained comatose. MRI brain obtained and findings worrisome for anoxic brain injury. Per Neurology note on 10/14/22 they "do not think he has any chance of neurologically meaningful recovery and has sustained irreversible anoxic brain injury that is catastrophic". Palliative care as well as primary has been meeting with patient and family about trach/peg. We were asked to see. No family in the room. Patient has no prior neck or abdominal surgeries. He is currently getting nutrition from tf's via cortrak w/o n/v and is having bm's. He is on ppx lovenox but otherwise no blood thinners.   ROS: ROS As above, see hpi. Limited 2/2 patient intubated   Family History  Problem Relation Age of Onset   Colon cancer Mother 39   Diabetes Mother    Lung cancer Father    Diabetes Sister    Heart attack Brother    Cancer Brother        type unknown    Past Medical History:  Diagnosis Date   Elevated lipids    Hypertension    Tobacco abuse     Past Surgical History:  Procedure Laterality Date   INCISION AND DRAINAGE Left 10/06/2013   Procedure: INCISION AND DRAINAGE LEFT LONG FINGER;  Surgeon: Tennis Must, MD;  Location: [facility];  Service: Orthopedics;  Laterality: Left;   STERIOD INJECTION  2005back    Social History:  reports that he has been smoking cigarettes. He has a 20.00 pack-year smoking history. He has never used smokeless tobacco. He reports that he does not drink alcohol and does not use drugs.  Allergies: No Known Allergies  Medications Prior to Admission  Medication Sig  Dispense Refill   acetaminophen (TYLENOL) 500 MG tablet Take 1 tablet (500 mg total) by mouth every 6 (six) hours as needed. 30 tablet 0   amLODipine (NORVASC) 5 MG tablet Take 1 tablet by mouth daily.  1   atorvastatin (LIPITOR) 10 MG tablet Take 1 tablet by mouth daily.  0   benzonatate (TESSALON) 100 MG capsule Take 1 capsule (100 mg total) by mouth every 8 (eight) hours. 21 capsule 0   ibuprofen (ADVIL) 600 MG tablet Take 1 tablet (600 mg total) by mouth every 6 (six) hours as needed. 30 tablet 0   ondansetron (ZOFRAN) 4 MG tablet Take 1 tablet (4 mg total) by mouth every 6 (six) hours. 12 tablet 0   pantoprazole (PROTONIX) 40 MG tablet Take 1 tablet by mouth daily.  1   Vent Mode: CPAP;PSV FiO2 (%):  [30 %] 30 % Set Rate:  [15 bmp] 15 bmp Vt Set:  [550 mL] 550 mL PEEP:  [5 cmH20] 5 cmH20 Pressure Support:  [8 cmH20] 8 cmH20 Plateau Pressure:  [10 cmH20-13 cmH20] 12 cmH20   Physical Exam: Blood pressure 112/87, pulse (!) 113, temperature 99.4 F (37.4 C), temperature source Axillary, resp. rate 14, height 5\' 4"  (1.626 m), weight 52.1 kg, SpO2 97 %. General: WD/WN male who is laying in bed HEENT: head is normocephalic, atraumatic. Heart: Tachycardic with regular rhythm Lungs: CTA b/l. On vent with settings as above.  Abd: Soft, ND, NT, +BS. There is a small scar on the left mid/upper abdomen. Otherwise no scars on abdomen to indicate prior abdominal surgery.  MS: no BUE or BLE edema Skin: warm, diaphoretic  Psych: Unable to fully assess. Patient w/ anoxic brain injury. Not on sedation.  Neuro: Unable to fully assess. Patient w/ anoxic brain injury. Not on sedation. He does not interact or follow commands for me.   Results for orders placed or performed during the hospital encounter of 10/03/22 (from the past 48 hour(s))  Glucose, capillary     Status: Abnormal   Collection Time: 10/22/22  3:23 PM  Result Value Ref Range   Glucose-Capillary 123 (H) 70 - 99 mg/dL    Comment:  Glucose reference range applies only to samples taken after fasting for at least 8 hours.  Glucose, capillary     Status: Abnormal   Collection Time: 10/22/22  7:55 PM  Result Value Ref Range   Glucose-Capillary 129 (H) 70 - 99 mg/dL    Comment: Glucose reference range applies only to samples taken after fasting for at least 8 hours.  Glucose, capillary     Status: Abnormal   Collection Time: 10/22/22 11:49 PM  Result Value Ref Range   Glucose-Capillary 136 (H) 70 - 99 mg/dL    Comment: Glucose reference range applies only to samples taken after fasting for at least 8 hours.  Glucose, capillary     Status: Abnormal   Collection Time: 10/23/22  4:00 AM  Result Value Ref Range   Glucose-Capillary 128 (H) 70 - 99 mg/dL    Comment: Glucose reference range applies only to samples taken after fasting for at least 8 hours.  CBC     Status: Abnormal   Collection Time: 10/23/22  6:41 AM  Result Value Ref Range   WBC 14.8 (H) 4.0 - 10.5 K/uL   RBC 3.58 (L) 4.22 - 5.81 MIL/uL   Hemoglobin 9.9 (L) 13.0 - 17.0 g/dL   HCT 23.5 (L) 57.3 - 22.0 %   MCV 88.3 80.0 - 100.0 fL   MCH 27.7 26.0 - 34.0 pg   MCHC 31.3 30.0 - 36.0 g/dL   RDW 25.4 (H) 27.0 - 62.3 %   Platelets 550 (H) 150 - 400 K/uL   nRBC 0.0 0.0 - 0.2 %    Comment: Performed at [facility], [address], [place], [place] [address]  Comprehensive metabolic panel     Status: Abnormal   Collection Time: 10/23/22  6:41 AM  Result Value Ref Range   Sodium 140 135 - 145 mmol/L   Potassium 4.3 3.5 - 5.1 mmol/L   Chloride 108 98 - 111 mmol/L   CO2 23 22 - 32 mmol/L   Glucose, Bld 131 (H) 70 - 99 mg/dL    Comment: Glucose reference range applies only to samples taken after fasting for at least 8 hours.   BUN 50 (H) 6 - 20 mg/dL   Creatinine, Ser 1.51 0.61 - 1.24 mg/dL   Calcium 9.4 8.9 - 76.1 mg/dL   Total Protein 8.8 (H) 6.5 - 8.1 g/dL   Albumin 2.4 (L) 3.5 - 5.0 g/dL   AST 607 (H) 15 - 41 U/L   ALT 456 (H) 0 - 44 U/L    Alkaline Phosphatase 118 38 - 126 U/L   Total Bilirubin 0.4 0.3 - 1.2 mg/dL   GFR, Estimated >37 >10 mL/min    Comment: (NOTE) Calculated using the CKD-EPI Creatinine Equation (2021)  Anion gap 9 5 - 15    Comment: Performed at [facility], [address] [place], [place] [address]  Magnesium     Status: None   Collection Time: 10/23/22  6:41 AM  Result Value Ref Range   Magnesium 2.3 1.7 - 2.4 mg/dL    Comment: Performed at [facility], [address] [place], [place] [address]  Phosphorus     Status: None   Collection Time: 10/23/22  6:41 AM  Result Value Ref Range   Phosphorus 3.9 2.5 - 4.6 mg/dL    Comment: Performed at [facility], [address] [place], [place] [address]  Glucose, capillary     Status: Abnormal   Collection Time: 10/23/22  8:24 AM  Result Value Ref Range   Glucose-Capillary 140 (H) 70 - 99 mg/dL    Comment: Glucose reference range applies only to samples taken after fasting for at least 8 hours.  Culture, Respiratory w Gram Stain     Status: None (Preliminary result)   Collection Time: 10/23/22 10:57 AM   Specimen: Tracheal Aspirate; Respiratory  Result Value Ref Range   Specimen Description TRACHEAL ASPIRATE    Special Requests NONE    Gram Stain      MODERATE WBC PRESENT, PREDOMINANTLY PMN FEW SQUAMOUS EPITHELIAL CELLS PRESENT FEW GRAM POSITIVE COCCI IN PAIRS    Culture      CULTURE REINCUBATED FOR BETTER GROWTH Performed at [facility], [address], [place], [place] [address]    Report Status PENDING   Glucose, capillary     Status: Abnormal   Collection Time: 10/23/22 11:41 AM  Result Value Ref Range   Glucose-Capillary 122 (H) 70 - 99 mg/dL    Comment: Glucose reference range applies only to samples taken after fasting for at least 8 hours.  Glucose, capillary     Status: Abnormal   Collection Time: 10/23/22  3:41 PM  Result Value Ref Range   Glucose-Capillary 121 (H) 70 - 99 mg/dL    Comment: Glucose  reference range applies only to samples taken after fasting for at least 8 hours.  Glucose, capillary     Status: Abnormal   Collection Time: 10/23/22  8:13 PM  Result Value Ref Range   Glucose-Capillary 137 (H) 70 - 99 mg/dL    Comment: Glucose reference range applies only to samples taken after fasting for at least 8 hours.  Glucose, capillary     Status: Abnormal   Collection Time: 10/24/22 12:09 AM  Result Value Ref Range   Glucose-Capillary 139 (H) 70 - 99 mg/dL    Comment: Glucose reference range applies only to samples taken after fasting for at least 8 hours.  Glucose, capillary     Status: Abnormal   Collection Time: 10/24/22  3:57 AM  Result Value Ref Range   Glucose-Capillary 129 (H) 70 - 99 mg/dL    Comment: Glucose reference range applies only to samples taken after fasting for at least 8 hours.  Basic metabolic panel     Status: Abnormal   Collection Time: 10/24/22  7:55 AM  Result Value Ref Range   Sodium 142 135 - 145 mmol/L   Potassium 4.6 3.5 - 5.1 mmol/L   Chloride 108 98 - 111 mmol/L   CO2 25 22 - 32 mmol/L   Glucose, Bld 134 (H) 70 - 99 mg/dL    Comment: Glucose reference range applies only to samples taken after fasting for at least  8 hours.   BUN 48 (H) 6 - 20 mg/dL   Creatinine, Ser 0.71 0.61 - 1.24 mg/dL   Calcium 9.3 8.9 - 10.3 mg/dL   GFR, Estimated >60 >60 mL/min    Comment: (NOTE) Calculated using the CKD-EPI Creatinine Equation (2021)    Anion gap 9 5 - 15    Comment: Performed at [facility] [address], [place], [place] [address]  Glucose, capillary     Status: Abnormal   Collection Time: 10/24/22  8:02 AM  Result Value Ref Range   Glucose-Capillary 140 (H) 70 - 99 mg/dL    Comment: Glucose reference range applies only to samples taken after fasting for at least 8 hours.  Glucose, capillary     Status: Abnormal   Collection Time: 10/24/22 11:22 AM  Result Value Ref Range   Glucose-Capillary 126 (H) 70 - 99 mg/dL    Comment: Glucose  reference range applies only to samples taken after fasting for at least 8 hours.   DG Chest Port 1 View  Result Date: 10/23/2022 CLINICAL DATA:  62 year old male with shortness of breath, intubated. EXAM: PORTABLE CHEST 1 VIEW COMPARISON:  Portable chest 2024 and earlier. FINDINGS: Portable AP semi upright view at 1153 hours. Stable endotracheal tube tip in good position between the clavicles and carina. Patient mildly rotated to the right as before. Enteric tube continues to the abdomen, tip not included. Lung volumes and mediastinal contours remain within normal limits. Allowing for portable technique the lungs are clear. No pneumothorax or pleural effusion. Stable visualized osseous structures. Negative visible bowel gas. IMPRESSION: 1. Stable lines and tubes. 2. No acute cardiopulmonary abnormality. Electronically Signed   By: Genevie Ann M.D.   On: 10/23/2022 12:05    Anti-infectives (From admission, onward)    Start     Dose/Rate Route Frequency Ordered Stop   10/14/22 1000  piperacillin-tazobactam (ZOSYN) IVPB 3.375 g        3.375 g 12.5 mL/hr over 240 Minutes Intravenous Every 8 hours 10/14/22 0951 10/21/22 0556   10/04/22 2200  cefTRIAXone (ROCEPHIN) 2 g in sodium chloride 0.9 % 100 mL IVPB  Status:  Discontinued        2 g 200 mL/hr over 30 Minutes Intravenous Daily at bedtime 10/04/22 0914 10/10/22 0900   10/04/22 1045  oseltamivir (TAMIFLU) 6 MG/ML suspension 75 mg        75 mg Per Tube 2 times daily 10/04/22 0950 10/08/22 2229   10/04/22 0600  cefTRIAXone (ROCEPHIN) 2 g in sodium chloride 0.9 % 100 mL IVPB  Status:  Discontinued        2 g 200 mL/hr over 30 Minutes Intravenous Every 24 hours 10/03/22 1054 10/03/22 1250   10/04/22 0600  azithromycin (ZITHROMAX) 500 mg in sodium chloride 0.9 % 250 mL IVPB  Status:  Discontinued        500 mg 250 mL/hr over 60 Minutes Intravenous Every 24 hours 10/03/22 1054 10/03/22 1250   10/03/22 2200  cefTRIAXone (ROCEPHIN) 1 g in sodium chloride 0.9  % 100 mL IVPB  Status:  Discontinued        1 g 200 mL/hr over 30 Minutes Intravenous Daily at bedtime 10/03/22 1559 10/04/22 0914   10/03/22 2200  metroNIDAZOLE (FLAGYL) IVPB 500 mg  Status:  Discontinued        500 mg 100 mL/hr over 60 Minutes Intravenous Every 12 hours 10/03/22 1559 10/05/22 0812   10/03/22 1400  piperacillin-tazobactam (ZOSYN) IVPB 3.375 g  Status:  Discontinued        3.375 g 12.5 mL/hr over 240 Minutes Intravenous Every 8 hours 10/03/22 1258 10/03/22 1559   10/03/22 0515  cefTRIAXone (ROCEPHIN) 1 g in sodium chloride 0.9 % 100 mL IVPB        1 g 200 mL/hr over 30 Minutes Intravenous  Once 10/03/22 0503 10/03/22 0732   10/03/22 0515  azithromycin (ZITHROMAX) 500 mg in sodium chloride 0.9 % 250 mL IVPB        500 mg 250 mL/hr over 60 Minutes Intravenous  Once 10/03/22 0503 10/03/22 0732       Assessment/Plan Hx of Anoxic Brain Injury VDRF Dysphagia  Patient has been seen and examined. This is a 62 y.o. male who was found to have a anoxic brain injury after PEA arrest. We have been asked to see for Trach and PEG. I discussed case with my attending who plans to reach out to family to discuss indications, benefits, risks etc. Further recs to follow.    Jacinto Halim, [facility] 10/24/2022, 2:17 PM Please see Amion for pager number during day hours 7:00am-4:30pm

## 2022-10-24 NOTE — Progress Notes (Signed)
Palliative Care Progress Note, Assessment & Plan   Patient Name: Damon [name]       Date: 10/24/2022 DOB: 04-11-1961  Age: 62 y.o. MRN#: [id number] Attending Physician: Kipp Brood, MD Primary Care Physician: Center, [facility] Medical Admit Date: 10/03/2022  Subjective: Patient is lying in bed intubated.  He has no family/friends at bedside.  He does not track with his eyes and is unable to follow any commands.  Does not MAETC.  HPI: 62 y/o male admitted to [facility] on 12/20 in setting of acute hypoxemic respiratory failure due to Flu A. Placed on BIPAP. Had a 5 minute PEA arrest, required intubation. After several days on mechanical ventilation remains comatose, MRI brain findings worrisome for anoxic brain injury. Moved to [facility] for LTM EEG monitoring.  Palliative care has been asked to get involved for further goals of care conversations in the setting of anoxic brain injury post arrest. 12/31- no improvements, prognosis has been discussed extensively with family by palliative team, neurology and PCCM providers. 1/2- f/u meeting with patient's spouse [name]- she requested time and space as she grapples with processing eventual loss of spouse and how her life will be without him, she is not yet in a place to consider transition to comfort 1/7- propofol dc'd 1/5, no further myoclonus, no other sedating meds, discussed with spouse that decisions are needing to be made re: trach/PEG/LTC vs comfort 1/8 No significant changes in neuro status. Tachycardic to 130s, hypertensive to 140s. Coreg transitioned to metoprolol.   Summary of counseling/coordination of care: After reviewing the patient's chart and assessing the patient at bedside, I spoke with patient's father [name] over the phone.  She expressed great  appreciation for Dr. Lynetta Mare taking time to "speak with me and not to me" today in regards to patient's current medical status.  [name].  She is expecting a phone call from the surgical team to further discuss this patient is a candidate for a tracheostomy.  She shares she has not made a decision in regards to trach placement but is looking forward to hearing from the surgical team about further details regarding a trach for patient.  Therapeutic silence and active listening provided for [name] to share her thoughts and emotions regarding current medical situation.  Emotional support provided.  PA Maczis made aware that wife is expecting his call/recommendations before deciding on trach placement.   PMT will continue to follow.   Physical Exam Vitals reviewed.  Constitutional:      General: He is not in acute distress. HENT:     Head: Normocephalic.     Mouth/Throat:     Mouth: Mucous membranes are moist.  Cardiovascular:     Rate and Rhythm: Tachycardia present.     Pulses: Normal pulses.  Pulmonary:     Comments: MV Skin:    General: Skin is warm and dry.             Palliative Assessment/Data: 30%    Total Time 35 minutes   Thank you for allowing the Palliative Medicine Team to assist in the care of this patient.  [place] Ilsa Iha, FNP-BC Palliative Medicine Team Team Phone #  [phone]   

## 2022-10-24 NOTE — Progress Notes (Addendum)
NAME:  Damon [name], MRN:  [id number], DOB:  [birth year], LOS: 21 ADMISSION DATE:  10/03/2022, CONSULTATION DATE:  10/03/2022 REFERRING MD:  Ronaldo Miyamoto - TRH CHIEF COMPLAINT:  Dyspnea   History of Present Illness:  62 year old man admitted to [facility] 12/20 in the setting of acute hypoxemic respiratory failure due to Flu A. PMHx significant for   Placed on BIPAP.  Coded (likely in the setting of hypoxia), initial rhythm PEA with CPR x 5 minutes, required intubation.  After several days on mechanical ventilation remains comatose, MRI Brain 12/26 worrisome for anoxic brain injury.  Transferred to [facility] for LTM EEG monitoring.  Pertinent Medical History:   Past Medical History:  Diagnosis Date   Elevated lipids    Hypertension    Tobacco abuse    Significant Hospital Events: Including procedures, antibiotic start and stop dates in addition to other pertinent events   12/18 Flu A positive 12/20 Admitted, 5 minute code after non-compliance with BiPAP MRSA PCR - neg Urine strep _POS 12/21 Intubated, paralyzed, ARDS protocol, on 7cc/kg 12/22 Weaned to 6 cc/kg with dyssynchrony, back on 7 cc/kg, driving pressures good, weaning vent 12/23 Attempt to wean sedation again with dyssynchrony and desaturation.  Some concern for cuff leak, tube exchange.  Cuff did not appear to be blown.  Ongoing signs of cuff leak, query leaking around cough, possible large trachea, diuresing 12/25 tolerating PSV, myoclonus> ceribell, Neuro consult, changed to propofol, diuresed  12/26 Changed to DNR. On Propofol, versed.  Tolerating PSV.  MRI brain with hypoxic/anoxic injury Blood culture neg 12/27 GPD's on EEG, pending transfer to [facility] for cEEG NEURO CONSULT 12/28 moved to [facility] for LTM EEG 12/31 family updated by neuro: no chance for meaningful recovery, they should make plans for comfort measures 1/1 cultures - resp normal flora 1/2 eeg still with epileptogenicity. Palli fam mtg -- family does not want to hear from medical  teams unless there is "good news"  1/3  cEEG with burst suppression w highly epileptiform bursts.  1/4 cEEG with burst suppression  + highly epileptiform bursts still.  WBC slightly up to 22.5. Temp high 99 low 100  c-EEG stopped duie to lack of benefit Pallative care consult - wife upset about early dc from ER on 12/18 Chaplain consult: Wife is sufering from anticipatory grief + accepting god's way + curious/anger about events leading upto current state EEG: generalized epileptogenicity with high potential for seizures as well as profound diffuse encephalopathy. In the setting of cardiac arrest, this EEG pattern is suggestive of anoxic/hypoxic brain injury.  1/5 - Low grade fever +. WEBC 22.5K.  on Zosyn. On vent fio2 30%. TF +. Per RN - diprivan stopped yesterday. No myoclonus today. Mild tachycardia + Rpt pall care on 10/21/22 per wife 1/6 - On vent FiO2 30%.  Tmax 99,39F ., WBC better. Still off diprivan -> No myoclonus. cXR visualized - devices in place and faiure clear Oral oxy and klonpin stopped 1/8 No significant changes in neuro status. Tachycardic to 130s, hypertensive to 140s. Coreg transitioned to metoprolol.  Interim History / Subjective:  Condition is unchanged.  Still tachycardic despite metoprolol Family met with PMT, no decision yet made regarding tracheostomy and PEG tube.   Objective:  Blood pressure 122/85, pulse (!) 116, temperature 99.5 F (37.5 C), temperature source Axillary, resp. rate 15, height 5\' 4"  (1.626 m), weight 52.1 kg, SpO2 95 %.    Vent Mode: PRVC FiO2 (%):  [30 %] 30 % Set Rate:  [15 bmp-26  bmp] 26 bmp Vt Set:  [550 mL] 550 mL PEEP:  [5 cmH20] 5 cmH20 Plateau Pressure:  [10 cmH20-13 cmH20] 12 cmH20   Intake/Output Summary (Last 24 hours) at 10/24/2022 4782 Last data filed at 10/24/2022 0800 Gross per 24 hour  Intake 1824.42 ml  Output 2250 ml  Net -425.58 ml    Filed Weights   10/22/22 0500 10/23/22 0400 10/24/22 0300  Weight: 55 kg 53.1 kg 52.1  kg   Physical Examination: General: Acutely ill-appearing middle aged man in NAD. Muscle wasting noted HEENT: [place]/AT, anicteric sclera, PERRL 9mm, moist mucous membranes. ETT/OGT in place. Neuro:  Repetitive eye blinking to verbal stimulation.  Does not respond to verbal, tactile or noxious stimuli. Not following commands. No spontaneous movement of extremities noted. +Corneal, +Cough, and +Gag  CV: Tachycardic, regular rhythm, no m/g/r. PULM: Breathing even and not labored on vent (PEEP 5, FiO2 30%; weaned PSV 8/5). Lung fields clear throughout. GI: Soft, nontender, nondistended. Normoactive bowel sounds. Extremities: No  LE edema noted. Skin: Warm/dry, no rashes.  Ancillary test personally reviewed:    Assessment & Plan:   Acute respiratory failure with hypoxia/ARDS - Flu and strep pneumo PNA; Sp Tamiflu and Abx - Daily WUA/SBT, extubation currently precluded by mental status - VAP bundle - Keep off all sedation. - Ongoing GOC discussion re: tracheostomy, would likely need PEG for LTACH - now would be time.   Flu A CAP, pneumococcal pneumonia -  s/p Rx. S/p Zosyn 12/31 - 1/7; Ceftriaxone 12/20- 12/27, Azithro 12/20, Flagyl 12/20 - 12/22 - Increase chest PT to improve secretion clearance.  Cardiac arrest Anoxic encephalopathy with Seizure  - Evidence on MRI and EEG off diprivan  - AEDs (Keppra, valproic acid) - Prognosis is poor given limited progress now > 10 days following arrest.   Leukocytosis - CVC and foley were dc 1/3 . Zosyn 7d complete 10/21/22 Anemia, stable  - Trend CBC, fever curve - Monitor for signs of active bleeding - Transfuse for Hgb < 7.0 or hemodynamically significant bleeding  HTN - Switch to propranolol - may have sympathetic storming.  - Hydralazine PRN - Cardiac monitoring  Severe protein calorie malnutrition - Nutrition following, appreciate recs - TF via gastric tube  Hyperglycemia - CBGs Q4H - Goal CBG 140-180  GOC DNR status Care Giver  Distress + (anticiptory grief + upset at outcome +) Remains comatose. Patient's wife is understandably in great distress over patient's clinical status. Patient's grandson who has autism is struggling greatly with his grandfather's absence. Appreciate Palliative team's input and assistance with management/GOC conversations. - DNR code status at present - Ongoing discussion re: patient and family's wishes in terms of tracheostomy, PEG, LTACH - Awaiting more family members (son) for more in-depth decision making -Long conversation with the patient's wife today.  She is distressed regarding the sudden nature of his illness.  He was a great pillar of support for her.  She is still grieving.  She knows that he is unlikely to recover and that he would not want prolonged measures but she can is having emotionally difficult time letting go.  Best Practice (right click and "Reselect all SmartList Selections" daily)   Diet/type: tubefeeds DVT prophylaxis: LMWH GI prophylaxis: PPI Lines: n/a Foley:  N/A Code Status:  DNR Last date of multidisciplinary goals of care discussion: [PMT meeting with family 1/7]   The patient is critically ill with multiple organ system failure and requires high complexity decision making for assessment and support, frequent evaluation and  titration of therapies, advanced monitoring, review of radiographic studies and interpretation of complex data.   Critical Care Time devoted to patient care services, exclusive of separately billable procedures, described in this note is 35 minutes.  Kipp Brood, MD  Pulmonary & Critical Care 10/24/22 8:32 AM  Please see [email or web address] for pager details.  From 7A-7P if no response, please call [phone] After hours, please call ELink [phone]

## 2022-10-25 DIAGNOSIS — J9601 Acute respiratory failure with hypoxia: Secondary | ICD-10-CM | POA: Diagnosis not present

## 2022-10-25 DIAGNOSIS — Z9911 Dependence on respirator [ventilator] status: Secondary | ICD-10-CM

## 2022-10-25 DIAGNOSIS — Z7189 Other specified counseling: Secondary | ICD-10-CM | POA: Diagnosis not present

## 2022-10-25 DIAGNOSIS — G931 Anoxic brain damage, not elsewhere classified: Secondary | ICD-10-CM | POA: Diagnosis not present

## 2022-10-25 LAB — CBC WITH DIFFERENTIAL/PLATELET
Abs Immature Granulocytes: 0 10*3/uL (ref 0.00–0.07)
Basophils Absolute: 0.3 10*3/uL — ABNORMAL HIGH (ref 0.0–0.1)
Basophils Relative: 2 %
Eosinophils Absolute: 0.5 10*3/uL (ref 0.0–0.5)
Eosinophils Relative: 3 %
HCT: 34.4 % — ABNORMAL LOW (ref 39.0–52.0)
Hemoglobin: 10.8 g/dL — ABNORMAL LOW (ref 13.0–17.0)
Lymphocytes Relative: 12 %
Lymphs Abs: 1.8 10*3/uL (ref 0.7–4.0)
MCH: 27.9 pg (ref 26.0–34.0)
MCHC: 31.4 g/dL (ref 30.0–36.0)
MCV: 88.9 fL (ref 80.0–100.0)
Monocytes Absolute: 1.7 10*3/uL — ABNORMAL HIGH (ref 0.1–1.0)
Monocytes Relative: 11 %
Neutro Abs: 11.1 10*3/uL — ABNORMAL HIGH (ref 1.7–7.7)
Neutrophils Relative %: 72 %
Platelets: 541 10*3/uL — ABNORMAL HIGH (ref 150–400)
RBC: 3.87 MIL/uL — ABNORMAL LOW (ref 4.22–5.81)
RDW: 16.6 % — ABNORMAL HIGH (ref 11.5–15.5)
WBC: 15.4 10*3/uL — ABNORMAL HIGH (ref 4.0–10.5)
nRBC: 0 % (ref 0.0–0.2)
nRBC: 0 /100 WBC

## 2022-10-25 LAB — GLUCOSE, CAPILLARY
Glucose-Capillary: 120 mg/dL — ABNORMAL HIGH (ref 70–99)
Glucose-Capillary: 132 mg/dL — ABNORMAL HIGH (ref 70–99)
Glucose-Capillary: 115 mg/dL — ABNORMAL HIGH (ref 70–99)
Glucose-Capillary: 140 mg/dL — ABNORMAL HIGH (ref 70–99)
Glucose-Capillary: 122 mg/dL — ABNORMAL HIGH (ref 70–99)

## 2022-10-25 NOTE — TOC Progression Note (Signed)
Transition of Care [facility]) - CAGE-AID Screening   Patient Details  Name: Damon [name] MRN: [id number] Date of Birth: 10-Dec-1960    Dia Crawford, RN Phone Number: [phone] 10/25/2022, 6:08 AM   Clinical Narrative:  Pt unable to participate in cage aid screening due to unresponsiveness/intubation.   CAGE-AID Screening: Substance Abuse Screening unable to be completed due to: : Patient unable to participate (Intubated/unresponsive)

## 2022-10-25 NOTE — Progress Notes (Signed)
Pt with increased RR, MV, BP, RT Lavaged pt with copious secretions but still having rhonci. Pt taken off vent and bag lavaged but no other secretions obtained. Pt placed back on ventilator and RN made aware.

## 2022-10-25 NOTE — Progress Notes (Signed)
NAME:  Damon [name], MRN:  [id number], DOB:  23-Jun-1961, LOS: 56 ADMISSION DATE:  10/03/2022, CONSULTATION DATE:  10/03/2022 REFERRING MD:  Marylyn Ishihara - TRH CHIEF COMPLAINT:  Dyspnea   History of Present Illness:  62 year old man admitted to [facility] 12/20 in the setting of acute hypoxemic respiratory failure due to Flu A. PMHx significant for   Placed on BIPAP.  Coded (likely in the setting of hypoxia), initial rhythm PEA with CPR x 5 minutes, required intubation.  After several days on mechanical ventilation remains comatose, MRI Brain 12/26 worrisome for anoxic brain injury.  Transferred to [facility] for LTM EEG monitoring.  Pertinent Medical History:   Past Medical History:  Diagnosis Date   Elevated lipids    Hypertension    Tobacco abuse    Significant Hospital Events: Including procedures, antibiotic start and stop dates in addition to other pertinent events   12/18 Flu A positive 12/20 Admitted, 5 minute code after non-compliance with BiPAP MRSA PCR - neg Urine strep _POS 12/21 Intubated, paralyzed, ARDS protocol, on 7cc/kg 12/22 Weaned to 6 cc/kg with dyssynchrony, back on 7 cc/kg, driving pressures good, weaning vent 12/23 Attempt to wean sedation again with dyssynchrony and desaturation.  Some concern for cuff leak, tube exchange.  Cuff did not appear to be blown.  Ongoing signs of cuff leak, query leaking around cough, possible large trachea, diuresing 12/25 tolerating PSV, myoclonus> ceribell, Neuro consult, changed to propofol, diuresed  12/26 Changed to DNR. On Propofol, versed.  Tolerating PSV.  MRI brain with hypoxic/anoxic injury Blood culture neg 12/27 GPD's on EEG, pending transfer to [facility] for cEEG NEURO CONSULT 12/28 moved to [facility] for LTM EEG 12/31 family updated by neuro: no chance for meaningful recovery, they should make plans for comfort measures 1/1 cultures - resp normal flora 1/2 eeg still with epileptogenicity. Palli fam mtg -- family does not want to hear from medical  teams unless there is "good news"  1/3  cEEG with burst suppression w highly epileptiform bursts.  1/4 cEEG with burst suppression  + highly epileptiform bursts still.  WBC slightly up to 22.5. Temp high 99 low 100  c-EEG stopped duie to lack of benefit Pallative care consult - wife upset about early dc from ER on 12/18 Chaplain consult: Wife is sufering from anticipatory grief + accepting god's way + curious/anger about events leading upto current state EEG: generalized epileptogenicity with high potential for seizures as well as profound diffuse encephalopathy. In the setting of cardiac arrest, this EEG pattern is suggestive of anoxic/hypoxic brain injury.  1/5 - Low grade fever +. [place] 22.5K.  on Zosyn. On vent fio2 30%. TF +. Per RN - diprivan stopped yesterday. No myoclonus today. Mild tachycardia + Rpt pall care on 10/21/22 per wife 1/6 - On vent FiO2 30%.  Tmax 99,15F ., WBC better. Still off diprivan -> No myoclonus. cXR visualized - devices in place and faiure clear Oral oxy and klonpin stopped 1/8 No significant changes in neuro status. Tachycardic to 130s, hypertensive to 140s. Coreg transitioned to metoprolol. 1/11 wife considering trach/peg, to discuss with surgery  Interim History / Subjective:  No acute events Tachycardic on metoprolol Remains unresponsive  Objective:  Blood pressure 109/87, pulse (!) 124, temperature 99.8 F (37.7 C), temperature source Axillary, resp. rate (!) 22, height 5\' 4"  (1.626 m), weight 51.3 kg, SpO2 100 %.    Vent Mode: PRVC FiO2 (%):  [30 %] 30 % Set Rate:  [15 bmp] 15 bmp Vt Set:  [  550 mL] 550 mL PEEP:  [5 cmH20] 5 cmH20 Pressure Support:  [8 cmH20] 8 cmH20   Intake/Output Summary (Last 24 hours) at 10/25/2022 0811 Last data filed at 10/25/2022 0700 Gross per 24 hour  Intake 1265 ml  Output 1900 ml  Net -635 ml    Filed Weights   10/23/22 0400 10/24/22 0300 10/25/22 0300  Weight: 53.1 kg 52.1 kg 51.3 kg     General:  chronically and  critically ill-appearing M intubated  HEENT: MM pink/moist, sclera injected Neuro: unresponsive +triggering vent +corneals, does not withdraw to pain CV: s1s2 tachycardic, regular , no m/r/g PULM:  mechanically ventilated without signficant ronchi or wheezing  GI: soft, bsx4 active  Extremities: warm/dry, no edema, poor muscle tone and bulk  Skin: no rashes or lesions    Ancillary test personally reviewed:    Assessment & Plan:   Acute respiratory failure with hypoxia/ARDS - Flu and strep pneumo PNA; Sp Tamiflu and Abx - Daily WUA/SBT, extubation currently precluded by mental status -Maintain full vent support  -titrate Vent setting to maintain SpO2 greater than or equal to 90%. -HOB elevated 30 degrees. -Plateau pressures less than 30 cm H20.  -Follow chest x-ray, ABG prn.   -Bronchial hygiene and RT/bronchodilator protocol. - Ongoing GOC discussion re: tracheostomy, would likely need PEG for LTACH - wife stated she would discuss with Dr. Bobbye Morton on 1/12  Flu A CAP, pneumococcal pneumonia -  s/p Rx. S/p Zosyn 12/31 - 1/7; Ceftriaxone 12/20- 12/27, Azithro 12/20, Flagyl 12/20 - 12/22 - continue chest PT to improve secretion clearance.  Cardiac arrest Anoxic encephalopathy with Seizure  - Evidence on MRI and EEG off diprivan  - AEDs (Keppra, valproic acid) - Prognosis is poor given limited progress now > 10 days following arrest.   Leukocytosis - CVC and foley were dc 1/3 . Zosyn 7d complete 10/21/22 Anemia, stable  - Trend CBC, fever curve - Monitor for signs of active bleeding - Transfuse for Hgb < 7.0 or hemodynamically significant bleeding  HTN - Switch to propranolol - may have sympathetic storming.  - Hydralazine PRN - Cardiac monitoring  Severe protein calorie malnutrition - Nutrition following, appreciate recs - TF via gastric tube  Hyperglycemia - CBGs Q4H - Goal CBG 140-180  GOC DNR status Care Giver Distress + (anticiptory grief + upset at outcome  +) Remains comatose. Patient's wife is understandably in great distress over patient's clinical status. Patient's grandson who has autism is struggling greatly with his grandfather's absence. Appreciate Palliative team's input and assistance with management/GOC conversations. - DNR code status at present - Ongoing discussion re: patient and family's wishes in terms of tracheostomy, PEG, LTACH - Wife to discuss with surgery 1/12  Best Practice (right click and "Reselect all SmartList Selections" daily)   Diet/type: tubefeeds DVT prophylaxis: LMWH GI prophylaxis: PPI Lines: n/a Foley:  N/A Code Status:  DNR Last date of multidisciplinary goals of care discussion: [PMT meeting with family 1/7]   The patient is critically ill with multiple organ system failure and requires high complexity decision making for assessment and support, frequent evaluation and titration of therapies, advanced monitoring, review of radiographic studies and interpretation of complex data.   Critical Care Time devoted to patient care services, exclusive of separately billable procedures, described in this note is 35 minutes.  Otilio Carpen Avelynn Sellin, PA-C [facility] 10/25/22 8:11 AM  Please see [email or web address] for pager details.  From 7A-7P if no response, please call [phone] After hours,  please call ELink [phone]

## 2022-10-25 NOTE — Progress Notes (Signed)
Daily Progress Note   Patient Name: Damon [name]       Date: 10/25/2022 DOB: August 07, 1961  Age: 62 y.o. MRN#: [id number] Attending Physician: Kipp Brood, MD Primary Care Physician: [facility] Medical Admit Date: 10/03/2022  Reason for Consultation/Follow-up: Establishing goals of care  Patient Profile/HPI: 62 y/o male admitted to [facility] on 12/20 in setting of acute hypoxemic respiratory failure due to Flu A. Placed on BIPAP. Had a 5 minute PEA arrest, required intubation. After several days on mechanical ventilation remains comatose, MRI brain findings worrisome for anoxic brain injury. Moved to [facility] for LTM EEG monitoring.  Palliative care has been asked to get involved for further goals of care conversations in the setting of anoxic brain injury post arrest. 12/31- no improvements, prognosis has been discussed extensively with family by palliative team, neurology and PCCM providers. 1/2- f/u meeting with patient's spouse [name]- she requested time and space as she grapples with processing eventual loss of spouse and how her life will be without him, she is not yet in a place to consider transition to comfort 1/7- propofol dc'd 1/5, no further myoclonus, no other sedating meds, discussed with spouse that decisions are needing to be made re: trach/PEG/LTC vs comfort 1/8 No significant changes in neuro status. Tachycardic to 130s, hypertensive to 140s. Coreg transitioned to metoprolol.   1/11-having some recurrent myoclonus ?neurostorming  Subjective: Chart reviewed including labs, progress notes, imaging from this and previous encounters.  Evaluated Lamaj.  Anise Salvo for followup. She expressed appreciation for time Dr. Lynetta Mare took to discuss Syair's medical care with her and answer  her questions.  She is expecting to meet with surgeon tomorrow morning.  Her son has come into town, but he does not want to visit and see his Dad in the state he is in.   Review of Systems  Unable to perform ROS: Intubated     Physical Exam Vitals and nursing note reviewed.  Cardiovascular:     Rate and Rhythm: Normal rate.  Neurological:     Comments: L arm myoclonus, unresponsive             Vital Signs: BP 113/86   Pulse (!) 132   Temp 99 F (37.2 C) (Oral)   Resp 16   Ht 5\' 4"  (1.626 m)   Wt 51.3 kg  SpO2 98%   BMI 19.41 kg/m  SpO2: SpO2: 98 % O2 Device: O2 Device: Ventilator O2 Flow Rate:    Intake/output summary:  Intake/Output Summary (Last 24 hours) at 10/25/2022 1459 Last data filed at 10/25/2022 1400 Gross per 24 hour  Intake 1320 ml  Output 1500 ml  Net -180 ml   LBM: Last BM Date : 10/25/22 Baseline Weight: Weight: 64.9 kg Most recent weight: Weight: 51.3 kg       Palliative Assessment/Data: PPS: 10%      Patient Active Problem List   Diagnosis Date Noted   Advanced care planning/counseling discussion 10/21/2022   On mechanically assisted ventilation ([place]) 10/17/2022   Anoxic brain damage ([place]) 10/17/2022   Anoxic encephalopathy ([place]) 10/16/2022   Seizure ([place]) 10/16/2022   Pressure injury of skin 10/16/2022   Acute hypoxic respiratory failure ([place]) 10/03/2022   CAP (community acquired pneumonia) 10/03/2022   Influenza and pneumonia 10/03/2022   Elevated LFTs 10/03/2022   HTN (hypertension) 10/03/2022   HLD (hyperlipidemia) 10/03/2022   Tobacco abuse 10/03/2022   SIRS (systemic inflammatory response syndrome) ([place]) 10/03/2022   Influenza A 10/03/2022   Cardiac arrest ([place]) 10/03/2022   Tobacco dependence 10/03/2022    Palliative Care Assessment & Plan    Assessment/Recommendations/Plan  [name] has made no decisions to date re:  plan of care, she is interested in speaking with surgeon tomorrow morning regarding  trach/PEG  Code Status: DNR  Prognosis:  Unable to determine  Discharge Planning: To Be Determined  Care plan was discussed with care team and family.  Thank you for allowing the Palliative Medicine Team to assist in the care of this patient.  Total time: 50 minutes  Greater than 50%  of this time was spent counseling and coordinating care related to the above assessment and plan.  [name], AGNP-C Palliative Medicine   Please contact Palliative Medicine Team phone at [phone] for questions and concerns.

## 2022-10-26 DIAGNOSIS — Z9911 Dependence on respirator [ventilator] status: Secondary | ICD-10-CM | POA: Diagnosis not present

## 2022-10-26 DIAGNOSIS — J9601 Acute respiratory failure with hypoxia: Secondary | ICD-10-CM | POA: Diagnosis not present

## 2022-10-26 DIAGNOSIS — G931 Anoxic brain damage, not elsewhere classified: Secondary | ICD-10-CM | POA: Diagnosis not present

## 2022-10-26 DIAGNOSIS — Z7189 Other specified counseling: Secondary | ICD-10-CM | POA: Diagnosis not present

## 2022-10-26 LAB — CBC WITH DIFFERENTIAL/PLATELET
Abs Immature Granulocytes: 0 10*3/uL (ref 0.00–0.07)
Basophils Absolute: 0.5 10*3/uL — ABNORMAL HIGH (ref 0.0–0.1)
Basophils Relative: 3 %
Eosinophils Absolute: 0.3 10*3/uL (ref 0.0–0.5)
Eosinophils Relative: 2 %
HCT: 36.6 % — ABNORMAL LOW (ref 39.0–52.0)
Hemoglobin: 11.8 g/dL — ABNORMAL LOW (ref 13.0–17.0)
Lymphocytes Relative: 15 %
Lymphs Abs: 2.3 10*3/uL (ref 0.7–4.0)
MCH: 28.4 pg (ref 26.0–34.0)
MCHC: 32.2 g/dL (ref 30.0–36.0)
MCV: 88.2 fL (ref 80.0–100.0)
Monocytes Absolute: 1.7 10*3/uL — ABNORMAL HIGH (ref 0.1–1.0)
Monocytes Relative: 11 %
Neutro Abs: 10.4 10*3/uL — ABNORMAL HIGH (ref 1.7–7.7)
Neutrophils Relative %: 69 %
Platelets: 524 10*3/uL — ABNORMAL HIGH (ref 150–400)
RBC: 4.15 MIL/uL — ABNORMAL LOW (ref 4.22–5.81)
RDW: 16.9 % — ABNORMAL HIGH (ref 11.5–15.5)
WBC: 15 10*3/uL — ABNORMAL HIGH (ref 4.0–10.5)
nRBC: 0 % (ref 0.0–0.2)
nRBC: 0 /100 WBC

## 2022-10-26 LAB — GLUCOSE, CAPILLARY
Glucose-Capillary: 114 mg/dL — ABNORMAL HIGH (ref 70–99)
Glucose-Capillary: 124 mg/dL — ABNORMAL HIGH (ref 70–99)
Glucose-Capillary: 132 mg/dL — ABNORMAL HIGH (ref 70–99)
Glucose-Capillary: 132 mg/dL — ABNORMAL HIGH (ref 70–99)
Glucose-Capillary: 148 mg/dL — ABNORMAL HIGH (ref 70–99)
Glucose-Capillary: 149 mg/dL — ABNORMAL HIGH (ref 70–99)
Glucose-Capillary: 149 mg/dL — ABNORMAL HIGH (ref 70–99)

## 2022-10-26 LAB — CULTURE, RESPIRATORY W GRAM STAIN: Culture: NORMAL

## 2022-10-26 LAB — BASIC METABOLIC PANEL
Anion gap: 12 (ref 5–15)
BUN: 72 mg/dL — ABNORMAL HIGH (ref 6–20)
CO2: 23 mmol/L (ref 22–32)
Calcium: 9.7 mg/dL (ref 8.9–10.3)
Chloride: 112 mmol/L — ABNORMAL HIGH (ref 98–111)
Creatinine, Ser: 0.99 mg/dL (ref 0.61–1.24)
GFR, Estimated: >60 mL/min (ref >60)
Glucose, Bld: 116 mg/dL — ABNORMAL HIGH (ref 70–99)
Potassium: 4.9 mmol/L (ref 3.5–5.1)
Sodium: 147 mmol/L — ABNORMAL HIGH (ref 135–145)

## 2022-10-26 LAB — MAGNESIUM: Magnesium: 2.8 mg/dL — ABNORMAL HIGH (ref 1.7–2.4)

## 2022-10-26 MED ORDER — OXYCODONE HCL 5 MG PO TABS
5.0000 mg | ORAL_TABLET | ORAL | Status: DC | PRN
  Administered 2022-10-26 – 2023-01-22 (×60): 5 mg
  Filled 2022-10-26 (×64): qty 1

## 2022-10-26 MED ORDER — FENTANYL CITRATE PF 50 MCG/ML IJ SOSY
50.0000 ug | PREFILLED_SYRINGE | INTRAMUSCULAR | Status: DC | PRN
  Administered 2022-10-26: 50 ug via INTRAVENOUS
  Filled 2022-10-26: qty 1

## 2022-10-26 MED ORDER — METOPROLOL TARTRATE 5 MG/5ML IV SOLN
2.5000 mg | INTRAVENOUS | Status: DC | PRN
  Administered 2022-10-26 – 2022-12-17 (×19): 2.5 mg via INTRAVENOUS
  Filled 2022-10-26 (×21): qty 5

## 2022-10-26 MED ORDER — FENTANYL CITRATE PF 50 MCG/ML IJ SOSY
50.0000 ug | PREFILLED_SYRINGE | INTRAMUSCULAR | Status: DC | PRN
  Administered 2022-10-26 – 2022-10-27 (×3): 200 ug via INTRAVENOUS
  Filled 2022-10-26 (×3): qty 4

## 2022-10-26 NOTE — Progress Notes (Signed)
NAME:  Damon [name], MRN:  [id number], DOB:  14-Nov-1960, LOS: 23 ADMISSION DATE:  10/03/2022, CONSULTATION DATE:  10/03/2022 REFERRING MD:  Marylyn Ishihara - TRH CHIEF COMPLAINT:  Dyspnea   History of Present Illness:  62 year old man admitted to [facility] 12/20 in the setting of acute hypoxemic respiratory failure due to Flu A. PMHx significant for   Placed on BIPAP.  Coded (likely in the setting of hypoxia), initial rhythm PEA with CPR x 5 minutes, required intubation.  After several days on mechanical ventilation remains comatose, MRI Brain 12/26 worrisome for anoxic brain injury.  Transferred to [facility] for LTM EEG monitoring.  Pertinent Medical History:   Past Medical History:  Diagnosis Date   Elevated lipids    Hypertension    Tobacco abuse    Significant Hospital Events: Including procedures, antibiotic start and stop dates in addition to other pertinent events   12/18 Flu A positive 12/20 Admitted, 5 minute code after non-compliance with BiPAP MRSA PCR - neg Urine strep _POS 12/21 Intubated, paralyzed, ARDS protocol, on 7cc/kg 12/22 Weaned to 6 cc/kg with dyssynchrony, back on 7 cc/kg, driving pressures good, weaning vent 12/23 Attempt to wean sedation again with dyssynchrony and desaturation.  Some concern for cuff leak, tube exchange.  Cuff did not appear to be blown.  Ongoing signs of cuff leak, query leaking around cough, possible large trachea, diuresing 12/25 tolerating PSV, myoclonus> ceribell, Neuro consult, changed to propofol, diuresed  12/26 Changed to DNR. On Propofol, versed.  Tolerating PSV.  MRI brain with hypoxic/anoxic injury Blood culture neg 12/27 GPD's on EEG, pending transfer to [facility] for cEEG NEURO CONSULT 12/28 moved to [facility] for LTM EEG 12/31 family updated by neuro: no chance for meaningful recovery, they should make plans for comfort measures 1/1 cultures - resp normal flora 1/2 eeg still with epileptogenicity. Palli fam mtg -- family does not want to hear from medical  teams unless there is "good news"  1/3  cEEG with burst suppression w highly epileptiform bursts.  1/4 cEEG with burst suppression  + highly epileptiform bursts still.  WBC slightly up to 22.5. Temp high 99 low 100  c-EEG stopped duie to lack of benefit Pallative care consult - wife upset about early dc from ER on 12/18 Chaplain consult: Wife is sufering from anticipatory grief + accepting god's way + curious/anger about events leading upto current state EEG: generalized epileptogenicity with high potential for seizures as well as profound diffuse encephalopathy. In the setting of cardiac arrest, this EEG pattern is suggestive of anoxic/hypoxic brain injury.  1/5 - Low grade fever +. [place] 22.5K.  on Zosyn. On vent fio2 30%. TF +. Per RN - diprivan stopped yesterday. No myoclonus today. Mild tachycardia + Rpt pall care on 10/21/22 per wife 1/6 - On vent FiO2 30%.  Tmax 99,108F ., WBC better. Still off diprivan -> No myoclonus. cXR visualized - devices in place and faiure clear Oral oxy and klonpin stopped 1/8 No significant changes in neuro status. Tachycardic to 130s, hypertensive to 140s. Coreg transitioned to metoprolol. 1/11 wife considering trach/peg, to discuss with surgery 1/12 more tachycardic  Interim History / Subjective:   Unchanged clinical status Tachycardic, vent dependent    Objective:  Blood pressure (!) 139/99, pulse (!) 134, temperature 99.9 F (37.7 C), temperature source Core (Comment), resp. rate (!) 29, height 5\' 4"  (1.626 m), weight 51.3 kg, SpO2 98 %.    Vent Mode: PRVC FiO2 (%):  [30 %] 30 % Set Rate:  [85  bmp] 15 bmp Vt Set:  [550 mL] 550 mL PEEP:  [5 cmH20] 5 cmH20 Plateau Pressure:  [15 cmH20-17 cmH20] 17 cmH20   Intake/Output Summary (Last 24 hours) at 10/26/2022 1338 Last data filed at 10/26/2022 0900 Gross per 24 hour  Intake 1155 ml  Output 1600 ml  Net -445 ml    Filed Weights   10/23/22 0400 10/24/22 0300 10/25/22 0300  Weight: 53.1 kg 52.1 kg 51.3  kg     General:  chronically and critically ill-appearing M intubated  HEENT: MM pink/moist, sclera injected Neuro: unresponsive +triggering vent CV: s1s2 tachycardic, regular , no m/r/g PULM:  mechanically ventilated without signficant ronchi or wheezing  GI: soft, bsx4 active  Extremities: warm/dry, no edema, poor muscle tone and bulk  Skin: no rashes or lesions    Ancillary test personally reviewed:    Assessment & Plan:   Acute respiratory failure with hypoxia/ARDS secondary to  Flu and strep pneumo PNA  Sp Tamiflu and Abx - Daily WUA/SBT, extubation currently precluded by mental status -Maintain full vent support  -titrate Vent setting to maintain SpO2 greater than or equal to 90%. -HOB elevated 30 degrees. -Plateau pressures less than 30 cm H20.  -Follow chest x-ray, ABG prn.   -Bronchial hygiene and RT/bronchodilator protocol. - Ongoing GOC discussion re: tracheostomy, would likely need PEG for LTACH - wife stated she would discuss with Dr. Bobbye Morton today -fentanyl pushes and oxycodone per tube ordered for tachycardia  Flu A CAP, pneumococcal pneumonia  S/p Zosyn 12/31 - 1/7; Ceftriaxone 12/20- 12/27, Azithro 12/20, Flagyl 12/20 - 12/22 - continue chest PT to improve secretion clearance.  Cardiac arrest Anoxic encephalopathy with Seizure   - Evidence on MRI and EEG off diprivan  - AEDs (Keppra, valproic acid) - Prognosis is poor given limited progress now > 10 days following arrest.   Leukocytosis - CVC and foley were dc 1/3 . Zosyn 7d complete 10/21/22 Anemia, stable  - Trend CBC, fever curve - Monitor for signs of active bleeding - Transfuse for Hgb < 7.0 or hemodynamically significant bleeding  HTN - Switch to propranolol - may have sympathetic storming.  - Hydralazine PRN - Cardiac monitoring  Severe protein calorie malnutrition - Nutrition following, appreciate recs - TF via gastric tube  Hyperglycemia - CBGs Q4H - Goal CBG 140-180  GOC DNR  status Care Giver Distress + (anticiptory grief + upset at outcome +) Remains comatose. Patient's wife has been understandably in great distress over patient's clinical status. Patient's grandson who has autism is struggling greatly with his grandfather's absence. Appreciate Palliative team's input and assistance with management/GOC conversations. - DNR code status at present - Ongoing discussion re: patient and family's wishes in terms of tracheostomy, PEG, LTACH - Wife to discuss with surgery 1/12  Best Practice (right click and "Reselect all SmartList Selections" daily)   Diet/type: tubefeeds DVT prophylaxis: LMWH GI prophylaxis: PPI Lines: n/a Foley:  N/A Code Status:  DNR Last date of multidisciplinary goals of care discussion: [PMT meeting with family 1/7]   The patient is critically ill with multiple organ system failure and requires high complexity decision making for assessment and support, frequent evaluation and titration of therapies, advanced monitoring, review of radiographic studies and interpretation of complex data.   Critical Care Time devoted to patient care services, exclusive of separately billable procedures, described in this note is 35 minutes.  Otilio Carpen Alexiz Sustaita, PA-C [facility] 10/26/22 1:38 PM  Please see [email or web address] for pager details.  From 7A-7P if no response, please call [phone] After hours, please call ELink [phone]

## 2022-10-26 NOTE — Progress Notes (Signed)
Daily Progress Note   Patient Name: Damon [name]       Date: 10/26/2022 DOB: 1961-05-22  Age: 62 y.o. MRN#: [id number] Attending Physician: Kipp Brood, MD Primary Care Physician: [facility], [facility] Medical Admit Date: 10/03/2022  Reason for Consultation/Follow-up: Establishing goals of care  Patient Profile/HPI: 62 y/o male admitted to [facility] on 12/20 in setting of acute hypoxemic respiratory failure due to Flu A. Placed on BIPAP. Had a 5 minute PEA arrest, required intubation. After several days on mechanical ventilation remains comatose, MRI brain findings worrisome for anoxic brain injury. Moved to [facility] for LTM EEG monitoring.  Palliative care has been asked to get involved for further goals of care conversations in the setting of anoxic brain injury post arrest. 12/31- no improvements, prognosis has been discussed extensively with family by palliative team, neurology and PCCM providers. 1/2- f/u meeting with patient's spouse [name]- she requested time and space as she grapples with processing eventual loss of spouse and how her life will be without him, she is not yet in a place to consider transition to comfort 1/7- propofol dc'd 1/5, no further myoclonus, no other sedating meds, discussed with spouse that decisions are needing to be made re: trach/PEG/LTC vs comfort 1/8 No significant changes in neuro status. Tachycardic to 130s, hypertensive to 140s. Coreg transitioned to metoprolol.   1/11-having some recurrent myoclonus ?neurostorming  Subjective: Chart reviewed including labs, progress notes, imaging from this and previous encounters.  Evaluated Aris. He does not respond to voice or touch.  Dr. Bobbye Morton present to discuss tracheostomy placement.  [name] again spent time expressing her  feelings of anger at the hospital regarding Richy's situation. She also feels guilt- feels that she pushed him to come to hospital and he would have chosen differently. She doesn't want Vera to live in a nursing facility but she isn't ready to let him go.  There is conflict in Jennifer's statements about Tywaun's values and proceeding with aggressive medical care. She notes that he didn't value going to doctors or receiving medical care. And she wouldn't want him to live in a facility- in particular "the one on [address]" (which I believe she is referring to [facility]).  However, she is not prepared for the alternative which would be comfort care and allowing his body to proceed with natural dying process.  After Dr. Bobbye Morton departed we spent more time discussing what the goal of placing a trach and PEG would be. Per [name] the goal would be to give her more time to emotionally prepare to let Danile go. We discussed that just because trach and PEG are placed- doesn't mean that she can't make a different decision in the future when she feels the time is right and choose to discontinue artificial life prolonging care at that time. She verbalized understanding Dae's situation.  I also discussed with her the process for finding placement when the medical team determines that he is stable enough for discharge- reviewing that stable doesn't mean that his status has improved as this is not expected- but stable simply means that he no longer requires hospital level care. Reviewed that his information would be sent to facilities and they would return with if they have a bed and if they can meet his needs, and then insurance would need to approve. Dr. Bobbye Morton, and myself in earlier discussions have let her know that options are likely to be limited as far as locations where he can be provided the necessary care.    Review of Systems  Unable to perform ROS: Intubated     Physical Exam Vitals and nursing note reviewed.   Cardiovascular:     Rate and Rhythm: Normal rate.  Neurological:     Comments: unresponsive             Vital Signs: BP 111/83   Pulse (!) 127   Temp 99.9 F (37.7 C) (Core (Comment))   Resp (!) 21   Ht 5\' 4"  (1.626 m)   Wt 51.3 kg   SpO2 98%   BMI 19.41 kg/m  SpO2: SpO2: 98 % O2 Device: O2 Device: Ventilator O2 Flow Rate:    Intake/output summary:  Intake/Output Summary (Last 24 hours) at 10/26/2022 1411 Last data filed at 10/26/2022 1400 Gross per 24 hour  Intake 1320 ml  Output 1600 ml  Net -280 ml    LBM: Last BM Date : 10/26/22 Baseline Weight: Weight: 64.9 kg Most recent weight: Weight: 51.3 kg       Palliative Assessment/Data: PPS: 10%      Patient Active Problem List   Diagnosis Date Noted   Advanced care planning/counseling discussion 10/21/2022   On mechanically assisted ventilation ([place]) 10/17/2022   Anoxic brain damage ([place]) 10/17/2022   Anoxic encephalopathy ([place]) 10/16/2022   Seizure ([place]) 10/16/2022   Pressure injury of skin 10/16/2022   Acute hypoxic respiratory failure ([place]) 10/03/2022   CAP (community acquired pneumonia) 10/03/2022   Influenza and pneumonia 10/03/2022   Elevated LFTs 10/03/2022   HTN (hypertension) 10/03/2022   HLD (hyperlipidemia) 10/03/2022   Tobacco abuse 10/03/2022   SIRS (systemic inflammatory response syndrome) ([place]) 10/03/2022   Influenza A 10/03/2022   Cardiac arrest ([place]) 10/03/2022   Tobacco dependence 10/03/2022    Palliative Care Assessment & Plan    Assessment/Recommendations/Plan  [name] would like to proceed with trach/PEG placement  Code Status: DNR  Prognosis:  Unable to determine  Discharge Planning: To Be Determined  Care plan was discussed with care team and family.  Thank you for allowing the Palliative Medicine Team to assist in the care of this patient.  Total time: 120 minutes  Greater than 50%  of this time was spent counseling and coordinating care related to the above  assessment and plan.  [name], AGNP-C Palliative Medicine   Please contact Palliative Medicine Team phone at  [phone] for questions and concerns.

## 2022-10-26 NOTE — Progress Notes (Signed)
Trauma/Critical Care Follow Up Note  Subjective:    Overnight Issues:   Objective:  Vital signs for last 24 hours: Temp:  [99 F (37.2 C)-100.7 F (38.2 C)] 99.5 F (37.5 C) (01/12 1600) Pulse Rate:  [103-153] 103 (01/12 1900) Resp:  [18-34] 27 (01/12 1900) BP: (96-166)/(68-141) 125/94 (01/12 1900) SpO2:  [95 %-100 %] 99 % (01/12 1900) FiO2 (%):  [30 %] 30 % (01/12 1651)  Hemodynamic parameters for last 24 hours:    Intake/Output from previous day: 01/11 0701 - 01/12 0700 In: 1265 [NG/GT:1265] Out: 1300 [Urine:1300]  Intake/Output this shift: No intake/output data recorded.  Vent settings for last 24 hours: Vent Mode: PRVC FiO2 (%):  [30 %] 30 % Set Rate:  [15 bmp] 15 bmp Vt Set:  [550 mL] 550 mL PEEP:  [5 cmH20] 5 cmH20 Plateau Pressure:  [15 cmH20-17 cmH20] 15 cmH20  Physical Exam:  Gen: comfortable, no distress Neuro: GCS3 off sedation HEENT: PERRL Neck: supple CV: RRR Pulm: unlabored breathing Abd: soft, NT GU: clear yellow urine Extr: wwp, no edema    Results for orders placed or performed during the hospital encounter of 10/03/22 (from the past 24 hour(s))  Glucose, capillary     Status: Abnormal   Collection Time: 10/25/22  8:11 PM  Result Value Ref Range   Glucose-Capillary 122 (H) 70 - 99 mg/dL  Glucose, capillary     Status: Abnormal   Collection Time: 10/26/22 12:08 AM  Result Value Ref Range   Glucose-Capillary 132 (H) 70 - 99 mg/dL  Glucose, capillary     Status: Abnormal   Collection Time: 10/26/22  3:56 AM  Result Value Ref Range   Glucose-Capillary 148 (H) 70 - 99 mg/dL  CBC with Differential/Platelet     Status: Abnormal   Collection Time: 10/26/22  4:36 AM  Result Value Ref Range   WBC 15.0 (H) 4.0 - 10.5 K/uL   RBC 4.15 (L) 4.22 - 5.81 MIL/uL   Hemoglobin 11.8 (L) 13.0 - 17.0 g/dL   HCT 36.6 (L) 39.0 - 52.0 %   MCV 88.2 80.0 - 100.0 fL   MCH 28.4 26.0 - 34.0 pg   MCHC 32.2 30.0 - 36.0 g/dL   RDW 16.9 (H) 11.5 - 15.5 %    Platelets 524 (H) 150 - 400 K/uL   nRBC 0.0 0.0 - 0.2 %   Neutrophils Relative % 69 %   Neutro Abs 10.4 (H) 1.7 - 7.7 K/uL   Lymphocytes Relative 15 %   Lymphs Abs 2.3 0.7 - 4.0 K/uL   Monocytes Relative 11 %   Monocytes Absolute 1.7 (H) 0.1 - 1.0 K/uL   Eosinophils Relative 2 %   Eosinophils Absolute 0.3 0.0 - 0.5 K/uL   Basophils Relative 3 %   Basophils Absolute 0.5 (H) 0.0 - 0.1 K/uL   WBC Morphology See Note    nRBC 0 0 /100 WBC   Abs Immature Granulocytes 0.00 0.00 - 0.07 K/uL  Basic metabolic panel     Status: Abnormal   Collection Time: 10/26/22  4:36 AM  Result Value Ref Range   Sodium 147 (H) 135 - 145 mmol/L   Potassium 4.9 3.5 - 5.1 mmol/L   Chloride 112 (H) 98 - 111 mmol/L   CO2 23 22 - 32 mmol/L   Glucose, Bld 116 (H) 70 - 99 mg/dL   BUN 72 (H) 6 - 20 mg/dL   Creatinine, Ser 0.99 0.61 - 1.24 mg/dL   Calcium 9.7 8.9 -  10.3 mg/dL   GFR, Estimated >93 >23 mL/min   Anion gap 12 5 - 15  Magnesium     Status: Abnormal   Collection Time: 10/26/22  4:36 AM  Result Value Ref Range   Magnesium 2.8 (H) 1.7 - 2.4 mg/dL  Glucose, capillary     Status: Abnormal   Collection Time: 10/26/22  7:23 AM  Result Value Ref Range   Glucose-Capillary 132 (H) 70 - 99 mg/dL  Glucose, capillary     Status: Abnormal   Collection Time: 10/26/22 11:21 AM  Result Value Ref Range   Glucose-Capillary 114 (H) 70 - 99 mg/dL  Glucose, capillary     Status: Abnormal   Collection Time: 10/26/22  3:21 PM  Result Value Ref Range   Glucose-Capillary 149 (H) 70 - 99 mg/dL    Assessment & Plan: The plan of care was discussed with the bedside nurse for the day, who is in agreement with this plan and no additional concerns were raised.   Present on Admission:  Acute hypoxic respiratory failure (HCC)    LOS: 23 days   Additional comments:I reviewed the patient's new clinical lab test results.   and I reviewed the patients new imaging test results.    Hx of Anoxic Brain  Injury VDRF Dysphagia   Lengthy discussion held with patient's wife at bedside per her request 1/10. Ocie Bob with Palliative present for this discussion. Conversation opened with indications for trach/peg and plan for discussion of r/b/c of trach/peg. Wife spent a lot of time discussing frustration at current clinical scenario with heavy focus on care at [facility] and how this impacts current clinical scenario. Space held for expression of emotions which she verbalizes as "hurt and angry." Reportedly a grievance has been filed and she is awaiting contact from hospital administration. Attempts repeatedly made to redirect to indication for trach/peg and prognosis. She clearly verbalizes that the patient has a high spirit of independence and was previously very active. She reports he had an avoidance of physicians/medical system. She clearly states that he would not choose to live in a nursing facility connected to machines and dependent on others for the remainder of his life, but states that she is choosing trach/peg because she is "being selfish" and she is "not going to just pull the plug." She is understandably very emotional. We specifically discussed him living in a vent-SNF post-procedure and she perseverated on him "not going to the nursing home one 29." I clearly stated that there are extremely limited vent-SNFs available within the state and he has a moderate to high likelihood of needing placement out of state. She seemed unable to process this. 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 She did seem accepting that this would not be possible. I did advise her that physicians have an obligation to the patient and do not have a requirement to perform procedures they believe to be unethical or futile. This is my first  conversation of a significant amount of time with her, but based on the prognosis by the neurology team and my clinical exam, I believe trach/peg may be futile procedures and based on information she provides, I suspect that performance of trach/peg may also be unethical. I recommend continued discussions with the wife to attempt to reach shared decision-making and have again escalated her grievance  concerns, as I believe that without addressing this, she will be unable to engage in meaningful and realistic dialogue regarding trach/peg.   Critical Care Total Time: 80 minutes  Jesusita Oka, MD Trauma & General Surgery Please use [email or web address] to contact on call provider  10/26/2022  *Care during the described time interval was provided by me. I have reviewed this patient's available data, including medical history, events of note, physical examination and test results as part of my evaluation.

## 2022-10-27 ENCOUNTER — Inpatient Hospital Stay (HOSPITAL_COMMUNITY): Payer: Medicare Other

## 2022-10-27 DIAGNOSIS — Z9911 Dependence on respirator [ventilator] status: Secondary | ICD-10-CM | POA: Diagnosis not present

## 2022-10-27 DIAGNOSIS — G931 Anoxic brain damage, not elsewhere classified: Secondary | ICD-10-CM | POA: Diagnosis not present

## 2022-10-27 DIAGNOSIS — J189 Pneumonia, unspecified organism: Secondary | ICD-10-CM

## 2022-10-27 DIAGNOSIS — R509 Fever, unspecified: Secondary | ICD-10-CM | POA: Diagnosis not present

## 2022-10-27 DIAGNOSIS — J9601 Acute respiratory failure with hypoxia: Secondary | ICD-10-CM | POA: Diagnosis not present

## 2022-10-27 DIAGNOSIS — Z7189 Other specified counseling: Secondary | ICD-10-CM | POA: Diagnosis not present

## 2022-10-27 LAB — BASIC METABOLIC PANEL
Anion gap: 13 (ref 5–15)
Anion gap: 10 (ref 5–15)
BUN: 90 mg/dL — ABNORMAL HIGH (ref 6–20)
BUN: 89 mg/dL — ABNORMAL HIGH (ref 6–20)
CO2: 24 mmol/L (ref 22–32)
CO2: 25 mmol/L (ref 22–32)
Calcium: 9.5 mg/dL (ref 8.9–10.3)
Calcium: 9.2 mg/dL (ref 8.9–10.3)
Chloride: 113 mmol/L — ABNORMAL HIGH (ref 98–111)
Chloride: 113 mmol/L — ABNORMAL HIGH (ref 98–111)
Creatinine, Ser: 1.26 mg/dL — ABNORMAL HIGH (ref 0.61–1.24)
Creatinine, Ser: 1.29 mg/dL — ABNORMAL HIGH (ref 0.61–1.24)
GFR, Estimated: >60 mL/min (ref >60)
GFR, Estimated: >60 mL/min (ref >60)
Glucose, Bld: 128 mg/dL — ABNORMAL HIGH (ref 70–99)
Glucose, Bld: 132 mg/dL — ABNORMAL HIGH (ref 70–99)
Potassium: 5.4 mmol/L — ABNORMAL HIGH (ref 3.5–5.1)
Potassium: 5.1 mmol/L (ref 3.5–5.1)
Sodium: 150 mmol/L — ABNORMAL HIGH (ref 135–145)
Sodium: 148 mmol/L — ABNORMAL HIGH (ref 135–145)

## 2022-10-27 LAB — CBC WITH DIFFERENTIAL/PLATELET
Abs Immature Granulocytes: 1.08 10*3/uL — ABNORMAL HIGH (ref 0.00–0.07)
Basophils Absolute: 0.2 10*3/uL — ABNORMAL HIGH (ref 0.0–0.1)
Basophils Relative: 1 %
Eosinophils Absolute: 0.1 10*3/uL (ref 0.0–0.5)
Eosinophils Relative: 1 %
HCT: 34.8 % — ABNORMAL LOW (ref 39.0–52.0)
Hemoglobin: 11 g/dL — ABNORMAL LOW (ref 13.0–17.0)
Immature Granulocytes: 6 %
Lymphocytes Relative: 15 %
Lymphs Abs: 2.7 10*3/uL (ref 0.7–4.0)
MCH: 28.6 pg (ref 26.0–34.0)
MCHC: 31.6 g/dL (ref 30.0–36.0)
MCV: 90.6 fL (ref 80.0–100.0)
Monocytes Absolute: 2.2 10*3/uL — ABNORMAL HIGH (ref 0.1–1.0)
Monocytes Relative: 13 %
Neutro Abs: 11.2 10*3/uL — ABNORMAL HIGH (ref 1.7–7.7)
Neutrophils Relative %: 64 %
Platelets: 419 10*3/uL — ABNORMAL HIGH (ref 150–400)
RBC: 3.84 MIL/uL — ABNORMAL LOW (ref 4.22–5.81)
RDW: 17.1 % — ABNORMAL HIGH (ref 11.5–15.5)
WBC: 17.5 10*3/uL — ABNORMAL HIGH (ref 4.0–10.5)
nRBC: 0 % (ref 0.0–0.2)

## 2022-10-27 LAB — HEPATIC FUNCTION PANEL
ALT: 372 U/L — ABNORMAL HIGH (ref 0–44)
AST: 118 U/L — ABNORMAL HIGH (ref 15–41)
Albumin: 2.8 g/dL — ABNORMAL LOW (ref 3.5–5.0)
Alkaline Phosphatase: 98 U/L (ref 38–126)
Bilirubin, Direct: <0.1 mg/dL (ref 0.0–0.2)
Total Bilirubin: 0.6 mg/dL (ref 0.3–1.2)
Total Protein: 8.6 g/dL — ABNORMAL HIGH (ref 6.5–8.1)

## 2022-10-27 LAB — GLUCOSE, CAPILLARY
Glucose-Capillary: 124 mg/dL — ABNORMAL HIGH (ref 70–99)
Glucose-Capillary: 111 mg/dL — ABNORMAL HIGH (ref 70–99)
Glucose-Capillary: 140 mg/dL — ABNORMAL HIGH (ref 70–99)
Glucose-Capillary: 136 mg/dL — ABNORMAL HIGH (ref 70–99)
Glucose-Capillary: 152 mg/dL — ABNORMAL HIGH (ref 70–99)

## 2022-10-27 LAB — AMMONIA: Ammonia: 78 umol/L — ABNORMAL HIGH (ref 9–35)

## 2022-10-27 MED ORDER — PROPRANOLOL HCL 60 MG PO TABS
60.0000 mg | ORAL_TABLET | Freq: Three times a day (TID) | ORAL | Status: DC
  Administered 2022-10-27 – 2022-10-28 (×3): 60 mg
  Filled 2022-10-27 (×4): qty 1

## 2022-10-27 MED ORDER — FENTANYL CITRATE PF 50 MCG/ML IJ SOSY
50.0000 ug | PREFILLED_SYRINGE | INTRAMUSCULAR | Status: DC | PRN
  Administered 2022-10-27 – 2022-10-30 (×7): 100 ug via INTRAVENOUS
  Filled 2022-10-27 (×7): qty 2

## 2022-10-27 MED ORDER — FAMOTIDINE 20 MG PO TABS
20.0000 mg | ORAL_TABLET | Freq: Every day | ORAL | Status: DC
  Administered 2022-10-27 – 2023-01-21 (×87): 20 mg
  Filled 2022-10-27 (×87): qty 1

## 2022-10-27 MED ORDER — FREE WATER
100.0000 mL | Status: DC
  Administered 2022-10-27 – 2022-10-28 (×6): 100 mL

## 2022-10-27 NOTE — Progress Notes (Signed)
NAME:  Damon [name], MRN:  [id number], DOB:  04-04-1961, LOS: 24 ADMISSION DATE:  10/03/2022, CONSULTATION DATE:  10/03/2022 REFERRING MD:  Marylyn Ishihara - TR[facility] CHIEF COMPLAINT:  Dyspnea   History of Present Illness:  62 year old man admitted to [facility] 12/20 in the setting of acute hypoxemic respiratory failure due to Flu A. PMHx significant for HTN, HLD, chronic back pain, tobacco abuse   Placed on BIPAP.  Coded (likely in the setting of hypoxia), initial rhythm PEA with CPR x 5 minutes, required intubation.  After several days on mechanical ventilation remains comatose, MRI Brain 12/26 worrisome for anoxic brain injury.  Transferred to [facility] for LTM EEG monitoring.  Neurology consulted. LTM with generalized epileptogenicity. Treated with Propofol, Depakote, Keppra, Ketamine. 12/31 Neurology with goals of care with family. Relayed that patient has a grim neurological recovery due to irreversible anoxic injury. Family wished to continue aggressive measures.   Palliative care consulted. Stay complicated with neuro-storming.   Pertinent Medical History:   Past Medical History:  Diagnosis Date   Elevated lipids    Hypertension    Tobacco abuse    Significant Hospital Events: Including procedures, antibiotic start and stop dates in addition to other pertinent events   12/18 Flu A positive 12/20 Admitted, 5 minute code after non-compliance with BiPAP MRSA PCR - neg Urine strep _POS 12/21 Intubated, paralyzed, ARDS protocol, on 7cc/kg 12/22 Weaned to 6 cc/kg with dyssynchrony, back on 7 cc/kg, driving pressures good, weaning vent 12/23 Attempt to wean sedation again with dyssynchrony and desaturation.  Some concern for cuff leak, tube exchange.  Cuff did not appear to be blown.  Ongoing signs of cuff leak, query leaking around cough, possible large trachea, diuresing 12/25 tolerating PSV, myoclonus> ceribell, Neuro consult, changed to propofol, diuresed  12/26 Changed to DNR. On Propofol, versed.   Tolerating PSV.  MRI brain with hypoxic/anoxic injury Blood culture neg 12/27 GPD's on EEG, pending transfer to [facility] for cEEG NEURO CONSULT 12/28 moved to [facility] for LTM EEG 12/31 family updated by neuro: no chance for meaningful recovery, they should make plans for comfort measures 1/1 cultures - resp normal flora 1/2 eeg still with epileptogenicity. Palli fam mtg -- family does not want to hear from medical teams unless there is "good news"  1/3  cEEG with burst suppression w highly epileptiform bursts.  1/4 cEEG with burst suppression  + highly epileptiform bursts still.  WBC slightly up to 22.5. Temp high 99 low 100  c-EEG stopped duie to lack of benefit Pallative care consult - wife upset about early dc from ER on 12/18 Chaplain consult: Wife is sufering from anticipatory grief + accepting god's way + curious/anger about events leading upto current state EEG: generalized epileptogenicity with high potential for seizures as well as profound diffuse encephalopathy. In the setting of cardiac arrest, this EEG pattern is suggestive of anoxic/hypoxic brain injury.  1/5 - Low grade fever +. [place] 22.5K.  on Zosyn. On vent fio2 30%. TF +. Per RN - diprivan stopped yesterday. No myoclonus today. Mild tachycardia + Rpt pall care on 10/21/22 per wife 1/6 - On vent FiO2 30%.  Tmax 99,90F ., WBC better. Still off diprivan -> No myoclonus. cXR visualized - devices in place and faiure clear Oral oxy and klonpin stopped 1/8 No significant changes in neuro status. Tachycardic to 130s, hypertensive to 140s. Coreg transitioned to metoprolol. 1/11 wife considering trach/peg, to discuss with surgery 1/12 more tachycardic  Interim History / Subjective:  This AM with  tachycardia, tachypnea. GCS 3. Received fentanyl PRN overnight   Objective:  Blood pressure 96/66, pulse (!) 121, temperature (!) 101.1 F (38.4 C), resp. rate 15, height 5\' 4"  (1.626 m), weight 51.3 kg, SpO2 97 %.    Vent Mode: PRVC FiO2 (%):  [30  %] 30 % Set Rate:  [15 bmp] 15 bmp Vt Set:  [550 mL] 550 mL PEEP:  [5 cmH20] 5 cmH20 Plateau Pressure:  [14 cmH20-17 cmH20] 16 cmH20   Intake/Output Summary (Last 24 hours) at 10/27/2022 10/29/2022 Last data filed at 10/27/2022 0600 Gross per 24 hour  Intake 1560 ml  Output 1300 ml  Net 260 ml   Filed Weights   10/23/22 0400 10/24/22 0300 10/25/22 0300  Weight: 53.1 kg 52.1 kg 51.3 kg     General: Critically ill older adult male on vent  HEENT: ETT/OG in place  Neuro: Does not open eyes, does not follow commands, no motor movement noted, -cough/gag, does over breath vent, pupils 2 mm bilaterally, sluggish  CV: Tachy, HR 128, no mRG  PULM: Coarse breath sounds, vent assisted breaths  GI: soft, active bowel sounds, non-distended  Extremities: warm/dry, no edema, poor muscle tone and bulk  Skin: no rashes or lesions   Resolved   Flu A CAP, pneumococcal pneumonia  S/p Zosyn 12/31 - 1/7; Ceftriaxone 12/20- 12/27, Azithro 12/20, Flagyl 12/20 - 12/22  Assessment & Plan:   Respiratory Insufficieny in setting of Anoxic Injury, Vent Dependent  Acute respiratory failure with hypoxia/ARDS secondary to Flu and strep pneumo PNA > Resolved  S/P Tamiflu and Abx Plan  -Daily SBT, extubation currently precluded by mental status -titrate Vent setting to maintain SpO2 greater than or equal to 90%. -HOB elevated 30 degrees. -Plateau pressures less than 30 cm H20.  -Follow chest x-ray, ABG prn.   -Bronchial hygiene and RT/bronchodilator protocol. -fentanyl pushes and oxycodone per tube ordered for tachycardia -Continue chest PT to improve secretion clearance. -Ongoing GOC discussion re: tracheostomy, would likely need PEG for LTACH: Surgery Consulted. Palliative Care Consulted. Patient poor candidate for Trach/PEG given exteremly poor prognosis   Severe Anoxic Injury with Seizures s/p Cardiac Arrest   MRI 12/26 > Fairly symmetric diffusion-weighted and T2 FLAIR hyperintense signal abnormality  within the bilateral basal ganglia. Subtle signal changes are also suspected within the bilateral hippocampi. Given the provided history, these findings are compatible with acute hypoxic/ischemic injury. Plan - AEDs (Keppra, valproic acid) - Prognosis is poor given limited progress now > 10 days following arrest.   Febrile State  Leukocytosis, WBC 17.5 (15.0)  - CVC and foley were dc 1/3 . Zosyn 7d complete 10/21/22 - CXR 1/9 no acute  Plan - Trend CBC, fever curve - Send Cultures   Hypernatremia  Plan - Trend BMP - Add Free Water   Anemia  Plan - Transfuse for Hgb < 7.0 or hemodynamically significant bleeding  HTN - Continue propranolol - (Concern for sympathetic storming.).>> increase to 80 q8h  - Hydralazine PRN - Cardiac monitoring  Severe protein calorie malnutrition - Nutrition following, appreciate recs - TF via gastric tube  Hyperglycemia - CBGs Q4H - Goal CBG 140-180  GOC DNR status Care Giver Distress + (anticiptory grief + upset at outcome +) Remains comatose. Patient's wife has been understandably in great distress over patient's clinical status. Patient's grandson who has autism is struggling greatly with his grandfather's absence. Appreciate Palliative team's input and assistance with management/GOC conversations. - DNR code status at present - Ongoing discussion re: patient and  family's wishes in terms of tracheostomy, PEG, LTACH. Wife spoke with surgery 1/12.   Best Practice (right click and "Reselect all SmartList Selections" daily)   Diet/type: tubefeeds DVT prophylaxis: LMWH GI prophylaxis: PPI Foley:  External Foley in place  Code Status:  DNR Last date of multidisciplinary goals of care discussion: ongoing  CRITICAL CARE Performed by: Omar Person   Total critical care time: 42 minutes  Critical care time was exclusive of separately billable procedures and treating other patients.  Critical care was necessary to treat or prevent  imminent or life-threatening deterioration.  Critical care was time spent personally by me on the following activities: development of treatment plan with patient and/or surrogate as well as nursing, discussions with consultants, evaluation of patient's response to treatment, examination of patient, obtaining history from patient or surrogate, ordering and performing treatments and interventions, ordering and review of laboratory studies, ordering and review of radiographic studies, pulse oximetry and re-evaluation of patient's condition.  Hayden Pedro, AGACNP-BC [facility]  PCCM Pgr: [phone]  Please see [email or web address] for pager details.  From 7A-7P if no response, please call [phone] After hours, please call ELink [phone]

## 2022-10-27 NOTE — Progress Notes (Signed)
VASCULAR LAB    Bilateral lower extremity venous duplex has been performed.  See CV proc for preliminary results.   Hjalmer Iovino, RVT 10/27/2022, 12:40 PM

## 2022-10-28 DIAGNOSIS — J9601 Acute respiratory failure with hypoxia: Secondary | ICD-10-CM | POA: Diagnosis not present

## 2022-10-28 DIAGNOSIS — Z7189 Other specified counseling: Secondary | ICD-10-CM | POA: Diagnosis not present

## 2022-10-28 DIAGNOSIS — Z9911 Dependence on respirator [ventilator] status: Secondary | ICD-10-CM | POA: Diagnosis not present

## 2022-10-28 DIAGNOSIS — G931 Anoxic brain damage, not elsewhere classified: Secondary | ICD-10-CM | POA: Diagnosis not present

## 2022-10-28 LAB — HEPATIC FUNCTION PANEL
ALT: 346 U/L — ABNORMAL HIGH (ref 0–44)
AST: 136 U/L — ABNORMAL HIGH (ref 15–41)
Albumin: 2.8 g/dL — ABNORMAL LOW (ref 3.5–5.0)
Alkaline Phosphatase: 87 U/L (ref 38–126)
Bilirubin, Direct: <0.1 mg/dL (ref 0.0–0.2)
Total Bilirubin: 0.4 mg/dL (ref 0.3–1.2)
Total Protein: 8.7 g/dL — ABNORMAL HIGH (ref 6.5–8.1)

## 2022-10-28 LAB — BASIC METABOLIC PANEL
Anion gap: 12 (ref 5–15)
BUN: 96 mg/dL — ABNORMAL HIGH (ref 6–20)
CO2: 24 mmol/L (ref 22–32)
Calcium: 9.2 mg/dL (ref 8.9–10.3)
Chloride: 117 mmol/L — ABNORMAL HIGH (ref 98–111)
Creatinine, Ser: 1.32 mg/dL — ABNORMAL HIGH (ref 0.61–1.24)
GFR, Estimated: >60 mL/min (ref >60)
Glucose, Bld: 144 mg/dL — ABNORMAL HIGH (ref 70–99)
Potassium: 5 mmol/L (ref 3.5–5.1)
Sodium: 153 mmol/L — ABNORMAL HIGH (ref 135–145)

## 2022-10-28 LAB — CBC
HCT: 36.5 % — ABNORMAL LOW (ref 39.0–52.0)
Hemoglobin: 11.6 g/dL — ABNORMAL LOW (ref 13.0–17.0)
MCH: 28.8 pg (ref 26.0–34.0)
MCHC: 31.8 g/dL (ref 30.0–36.0)
MCV: 90.6 fL (ref 80.0–100.0)
Platelets: 375 10*3/uL (ref 150–400)
RBC: 4.03 MIL/uL — ABNORMAL LOW (ref 4.22–5.81)
RDW: 17 % — ABNORMAL HIGH (ref 11.5–15.5)
WBC: 18.4 10*3/uL — ABNORMAL HIGH (ref 4.0–10.5)
nRBC: 0.1 % (ref 0.0–0.2)

## 2022-10-28 LAB — GLUCOSE, CAPILLARY
Glucose-Capillary: 110 mg/dL — ABNORMAL HIGH (ref 70–99)
Glucose-Capillary: 127 mg/dL — ABNORMAL HIGH (ref 70–99)
Glucose-Capillary: 127 mg/dL — ABNORMAL HIGH (ref 70–99)
Glucose-Capillary: 132 mg/dL — ABNORMAL HIGH (ref 70–99)
Glucose-Capillary: 135 mg/dL — ABNORMAL HIGH (ref 70–99)
Glucose-Capillary: 150 mg/dL — ABNORMAL HIGH (ref 70–99)
Glucose-Capillary: 156 mg/dL — ABNORMAL HIGH (ref 70–99)

## 2022-10-28 LAB — MAGNESIUM: Magnesium: 3.2 mg/dL — ABNORMAL HIGH (ref 1.7–2.4)

## 2022-10-28 LAB — PHOSPHORUS: Phosphorus: 6.5 mg/dL — ABNORMAL HIGH (ref 2.5–4.6)

## 2022-10-28 MED ORDER — FREE WATER
200.0000 mL | Status: DC
  Administered 2022-10-28 – 2022-10-30 (×13): 200 mL

## 2022-10-28 MED ORDER — PROPRANOLOL HCL 80 MG PO TABS
80.0000 mg | ORAL_TABLET | Freq: Three times a day (TID) | ORAL | Status: DC
  Administered 2022-10-28 – 2022-11-26 (×85): 80 mg
  Filled 2022-10-28 (×90): qty 1

## 2022-10-28 MED ORDER — BUSPIRONE HCL 10 MG PO TABS
10.0000 mg | ORAL_TABLET | Freq: Three times a day (TID) | ORAL | Status: DC
  Administered 2022-10-28 – 2022-10-30 (×9): 10 mg
  Filled 2022-10-28 (×9): qty 1

## 2022-10-28 MED ORDER — LACTULOSE 10 GM/15ML PO SOLN
20.0000 g | Freq: Three times a day (TID) | ORAL | Status: DC
  Administered 2022-10-28 – 2022-11-01 (×12): 20 g
  Filled 2022-10-28 (×12): qty 30

## 2022-10-28 NOTE — Progress Notes (Signed)
eLink Physician-Brief Progress Note Patient Name: Damon [name] DOB: Jul 15, 1961 MRN: [id number]   Date of Service  10/28/2022  HPI/Events of Note  Pt due Propranolol 80 mg, SBP has ranged 102-125. Order has no hold parameter. HR 130s Propranolol being given for sympathetic surges  eICU Interventions  Hold propranolol for SBP < 100, currently 125 Discussed with bedside RN     Intervention Category Intermediate Interventions: Other:  Judd Lien 10/28/2022, 10:38 PM

## 2022-10-28 NOTE — Progress Notes (Signed)
NAME:  Damon [name], MRN:  [id number], DOB:  1961-02-26, LOS: 25 ADMISSION DATE:  10/03/2022, CONSULTATION DATE:  10/03/2022 REFERRING MD:  Ronaldo Miyamoto - TRH CHIEF COMPLAINT:  Dyspnea   History of Present Illness:  62 year old man admitted to [facility] 12/20 in the setting of acute hypoxemic respiratory failure due to Flu A. PMHx significant for HTN, HLD, chronic back pain, tobacco abuse   Placed on BIPAP.  Coded (likely in the setting of hypoxia), initial rhythm PEA with CPR x 5 minutes, required intubation.  After several days on mechanical ventilation remains comatose, MRI Brain 12/26 worrisome for anoxic brain injury.  Transferred to [facility] for LTM EEG monitoring.  Neurology consulted. LTM with generalized epileptogenicity. Treated with Propofol, Depakote, Keppra, Ketamine. 12/31 Neurology with goals of care with family. Relayed that patient has a grim neurological recovery due to irreversible anoxic injury. Family wished to continue aggressive measures.   Palliative care consulted. Stay complicated with neuro-storming.   Pertinent Medical History:   Past Medical History:  Diagnosis Date   Elevated lipids    Hypertension    Tobacco abuse    Significant Hospital Events: Including procedures, antibiotic start and stop dates in addition to other pertinent events   12/18 Flu A positive 12/20 Admitted, 5 minute code after non-compliance with BiPAP MRSA PCR - neg Urine strep _POS 12/21 Intubated, paralyzed, ARDS protocol, on 7cc/kg 12/22 Weaned to 6 cc/kg with dyssynchrony, back on 7 cc/kg, driving pressures good, weaning vent 12/23 Attempt to wean sedation again with dyssynchrony and desaturation.  Some concern for cuff leak, tube exchange.  Cuff did not appear to be blown.  Ongoing signs of cuff leak, query leaking around cough, possible large trachea, diuresing 12/25 tolerating PSV, myoclonus> ceribell, Neuro consult, changed to propofol, diuresed  12/26 Changed to DNR. On Propofol, versed.   Tolerating PSV.  MRI brain with hypoxic/anoxic injury Blood culture neg 12/27 GPD's on EEG, pending transfer to [facility] for cEEG NEURO CONSULT 12/28 moved to [facility] for LTM EEG 12/31 family updated by neuro: no chance for meaningful recovery, they should make plans for comfort measures 1/1 cultures - resp normal flora 1/2 eeg still with epileptogenicity. Palli fam mtg -- family does not want to hear from medical teams unless there is "good news"  1/3  cEEG with burst suppression w highly epileptiform bursts.  1/4 cEEG with burst suppression  + highly epileptiform bursts still.  WBC slightly up to 22.5. Temp high 99 low 100  c-EEG stopped duie to lack of benefit Pallative care consult - wife upset about early dc from ER on 12/18 Chaplain consult: Wife is sufering from anticipatory grief + accepting god's way + curious/anger about events leading upto current state EEG: generalized epileptogenicity with high potential for seizures as well as profound diffuse encephalopathy. In the setting of cardiac arrest, this EEG pattern is suggestive of anoxic/hypoxic brain injury.  1/5 - Low grade fever +. WEBC 22.5K.  on Zosyn. On vent fio2 30%. TF +. Per RN - diprivan stopped yesterday. No myoclonus today. Mild tachycardia + Rpt pall care on 10/21/22 per wife 1/6 - On vent FiO2 30%.  Tmax 99,8F ., WBC better. Still off diprivan -> No myoclonus. cXR visualized - devices in place and faiure clear Oral oxy and klonpin stopped 1/8 No significant changes in neuro status. Tachycardic to 130s, hypertensive to 140s. Coreg transitioned to metoprolol. 1/11 wife considering trach/peg, to discuss with surgery 1/12 more tachycardic  Interim History / Subjective:  This AM with  Tmax 100.4. This AM tachycardia and diaphoretic   Objective:  Blood pressure 108/88, pulse (!) 115, temperature (!) 100.4 F (38 C), temperature source Core, resp. rate 17, height 5\' 4"  (1.626 m), weight 51.3 kg, SpO2 100 %.    Vent Mode: PRVC FiO2  (%):  [30 %] 30 % Set Rate:  [15 bmp] 15 bmp Vt Set:  [550 mL] 550 mL PEEP:  [5 cmH20] 5 cmH20 Plateau Pressure:  [10 cmH20-16 cmH20] 16 cmH20   Intake/Output Summary (Last 24 hours) at 10/28/2022 0740 Last data filed at 10/28/2022 0700 Gross per 24 hour  Intake 1820 ml  Output 1400 ml  Net 420 ml   Filed Weights   10/23/22 0400 10/24/22 0300 10/25/22 0300  Weight: 53.1 kg 52.1 kg 51.3 kg    General: Critically ill older adult male on vent  HEENT: ETT/OG in place  Neuro: Does not open eyes, does not follow commands, no motor movement noted, -cough/gag, does over breath vent, pupils 2 mm bilaterally, sluggish  CV: Tachy, HR 118, no mRG  PULM: Coarse breath sounds, vent assisted breaths  GI: soft, active bowel sounds, non-distended  Extremities: warm/dry, no edema, poor muscle tone and bulk  Skin: no rashes or lesions   Resolved   Flu A CAP, pneumococcal pneumonia  S/p Zosyn 12/31 - 1/7; Ceftriaxone 12/20- 12/27, Azithro 12/20, Flagyl 12/20 - 12/22  Assessment & Plan:   Respiratory Insufficieny in setting of Anoxic Injury, Vent Dependent  Acute respiratory failure with hypoxia/ARDS secondary to Flu and strep pneumo PNA > Resolved  S/P Tamiflu and Abx Plan  -Daily SBT, extubation currently precluded by mental status -titrate Vent setting to maintain SpO2 greater than or equal to 90%. -HOB elevated 30 degrees. -Plateau pressures less than 30 cm H20.  -Follow chest x-ray, ABG prn.   -Bronchial hygiene and RT/bronchodilator protocol. -Fentanyl and oxycodone PRN  -Continue chest PT to improve secretion clearance. -Ongoing GOC discussion re: tracheostomy, would likely need PEG for LTACH: Surgery Consulted. Palliative Care Consulted. Patient poor candidate for Trach/PEG given exteremly poor prognosis   Severe Anoxic Injury with Seizures s/p Cardiac Arrest   MRI 12/26 > Fairly symmetric diffusion-weighted and T2 FLAIR hyperintense signal abnormality within the bilateral basal  ganglia. Subtle signal changes are also suspected within the bilateral hippocampi. Given the provided history, these findings are compatible with acute hypoxic/ischemic injury. Plan - AEDs (Keppra, valproic acid) - Prognosis is poor given limited progress now > 10 days following arrest.   Concern for Ischemic Hepatitis with elevated AST/ALT - Acute Hep panel negative 12/20  Plan - Ammonia 78 >  will start lactulose   Febrile State  Leukocytosis, WBC 18.4 (17.5)  - CVC and foley were dc 1/3 . Zosyn 7d complete 10/21/22 - CXR 1/9 no acute  - U/S Lowers 1/13 negative for DVT  Plan - Trend CBC, fever curve - Follow Culture Data   Acute Kidney Injury with rising BUN 96(89) > no blood loss noted,hemoglobin stable  Hypernatremia  Plan - Trend BMP - Increase Free Water   Anemia  Plan - Transfuse for Hgb < 7.0 or hemodynamically significant bleeding  Tachycardia  HTN - Continue propranolol - (Concern for sympathetic storming.).>> increase to 80 q8h  - Hydralazine PRN - Cardiac monitoring  Severe protein calorie malnutrition - Nutrition following, appreciate recs - TF via gastric tube  Hyperglycemia - CBGs Q4H - Goal CBG 140-180  GOC DNR status Care Giver Distress + (anticiptory grief + upset at outcome +)  Remains comatose. Patient's wife has been understandably in great distress over patient's clinical status. Patient's grandson who has autism is struggling greatly with his grandfather's absence. Appreciate Palliative team's input and assistance with management/GOC conversations. - DNR code status at present - Ongoing discussion re: patient and family's wishes in terms of tracheostomy, PEG, LTACH. Wife spoke with surgery 1/12.  - Spoke with wife 1/13. She expressed her concerns with the entire stay starting at [facility]. >> See note from 1/13 for further information. But to summarize at this time wishes to proceed with trach/peg, no ready to "let him go"   Best Practice  (right click and "Reselect all SmartList Selections" daily)   Diet/type: tubefeeds DVT prophylaxis: LMWH GI prophylaxis: PPI Foley:  External Foley in place  Code Status:  DNR Last date of multidisciplinary goals of care discussion: ongoing  CRITICAL CARE Performed by: Omar Person   Total critical care time: 38 minutes  Critical care time was exclusive of separately billable procedures and treating other patients.  Critical care was necessary to treat or prevent imminent or life-threatening deterioration.  Critical care was time spent personally by me on the following activities: development of treatment plan with patient and/or surrogate as well as nursing, discussions with consultants, evaluation of patient's response to treatment, examination of patient, obtaining history from patient or surrogate, ordering and performing treatments and interventions, ordering and review of laboratory studies, ordering and review of radiographic studies, pulse oximetry and re-evaluation of patient's condition.  Hayden Pedro, AGACNP-BC [facility]  PCCM Pgr: [phone]  Please see [email or web address] for pager details.  From 7A-7P if no response, please call [phone] After hours, please call ELink [phone]

## 2022-10-29 ENCOUNTER — Inpatient Hospital Stay (HOSPITAL_COMMUNITY): Payer: Medicare Other

## 2022-10-29 DIAGNOSIS — G931 Anoxic brain damage, not elsewhere classified: Secondary | ICD-10-CM | POA: Diagnosis not present

## 2022-10-29 DIAGNOSIS — J9601 Acute respiratory failure with hypoxia: Secondary | ICD-10-CM | POA: Diagnosis not present

## 2022-10-29 DIAGNOSIS — J101 Influenza due to other identified influenza virus with other respiratory manifestations: Secondary | ICD-10-CM | POA: Diagnosis not present

## 2022-10-29 DIAGNOSIS — I469 Cardiac arrest, cause unspecified: Secondary | ICD-10-CM

## 2022-10-29 DIAGNOSIS — J189 Pneumonia, unspecified organism: Secondary | ICD-10-CM

## 2022-10-29 LAB — GLUCOSE, CAPILLARY
Glucose-Capillary: 142 mg/dL — ABNORMAL HIGH (ref 70–99)
Glucose-Capillary: 137 mg/dL — ABNORMAL HIGH (ref 70–99)
Glucose-Capillary: 133 mg/dL — ABNORMAL HIGH (ref 70–99)
Glucose-Capillary: 107 mg/dL — ABNORMAL HIGH (ref 70–99)
Glucose-Capillary: 129 mg/dL — ABNORMAL HIGH (ref 70–99)

## 2022-10-29 LAB — BASIC METABOLIC PANEL
Anion gap: 14 (ref 5–15)
BUN: 103 mg/dL — ABNORMAL HIGH (ref 6–20)
CO2: 23 mmol/L (ref 22–32)
Calcium: 9.1 mg/dL (ref 8.9–10.3)
Chloride: 117 mmol/L — ABNORMAL HIGH (ref 98–111)
Creatinine, Ser: 1.23 mg/dL (ref 0.61–1.24)
GFR, Estimated: >60 mL/min (ref >60)
Glucose, Bld: 149 mg/dL — ABNORMAL HIGH (ref 70–99)
Potassium: 4.6 mmol/L (ref 3.5–5.1)
Sodium: 154 mmol/L — ABNORMAL HIGH (ref 135–145)

## 2022-10-29 LAB — CBC
HCT: 35.1 % — ABNORMAL LOW (ref 39.0–52.0)
Hemoglobin: 11.3 g/dL — ABNORMAL LOW (ref 13.0–17.0)
MCH: 29.5 pg (ref 26.0–34.0)
MCHC: 32.2 g/dL (ref 30.0–36.0)
MCV: 91.6 fL (ref 80.0–100.0)
Platelets: 311 10*3/uL (ref 150–400)
RBC: 3.83 MIL/uL — ABNORMAL LOW (ref 4.22–5.81)
RDW: 17.2 % — ABNORMAL HIGH (ref 11.5–15.5)
WBC: 21.7 10*3/uL — ABNORMAL HIGH (ref 4.0–10.5)
nRBC: 0.1 % (ref 0.0–0.2)

## 2022-10-29 LAB — PHOSPHORUS: Phosphorus: 6.6 mg/dL — ABNORMAL HIGH (ref 2.5–4.6)

## 2022-10-29 LAB — MAGNESIUM: Magnesium: 3.4 mg/dL — ABNORMAL HIGH (ref 1.7–2.4)

## 2022-10-29 NOTE — Progress Notes (Signed)
Called to update Ms Gramling, left a voice message on [phone]  Also called [phone]- could not leave a voicemail

## 2022-10-29 NOTE — Progress Notes (Signed)
NAME:  Damon [name], MRN:  [id number], DOB:  Oct 22, 1960, LOS: 26 ADMISSION DATE:  10/03/2022, CONSULTATION DATE:  10/03/2022 REFERRING MD:  Ronaldo Miyamoto - TRH CHIEF COMPLAINT:  Dyspnea   History of Present Illness:  62 year old man admitted to [facility] 12/20 in the setting of acute hypoxemic respiratory failure due to Flu A. PMHx significant for HTN, HLD, chronic back pain, tobacco abuse   Placed on BIPAP.  Coded (likely in the setting of hypoxia), initial rhythm PEA with CPR x 5 minutes, required intubation.  After several days on mechanical ventilation remains comatose, MRI Brain 12/26 worrisome for anoxic brain injury.  Transferred to [facility] for LTM EEG monitoring.  Neurology consulted. LTM with generalized epileptogenicity. Treated with Propofol, Depakote, Keppra, Ketamine. 12/31 Neurology with goals of care with family. Relayed that patient has a grim neurological recovery due to irreversible anoxic injury. Family wished to continue aggressive measures.   Palliative care consulted. Stay complicated with neuro-storming.   Surgical consult regarding PEG/trach  Pertinent Medical History:   Past Medical History:  Diagnosis Date   Elevated lipids    Hypertension    Tobacco abuse    Significant Hospital Events: Including procedures, antibiotic start and stop dates in addition to other pertinent events   12/18 Flu A positive 12/20 Admitted, 5 minute code after non-compliance with BiPAP MRSA PCR - neg Urine strep _POS 12/21 Intubated, paralyzed, ARDS protocol, on 7cc/kg 12/22 Weaned to 6 cc/kg with dyssynchrony, back on 7 cc/kg, driving pressures good, weaning vent 12/23 Attempt to wean sedation again with dyssynchrony and desaturation.  Some concern for cuff leak, tube exchange.  Cuff did not appear to be blown.  Ongoing signs of cuff leak, query leaking around cough, possible large trachea, diuresing 12/25 tolerating PSV, myoclonus> ceribell, Neuro consult, changed to propofol, diuresed  12/26  Changed to DNR. On Propofol, versed.  Tolerating PSV.  MRI brain with hypoxic/anoxic injury Blood culture neg 12/27 GPD's on EEG, pending transfer to [facility] for cEEG NEURO CONSULT 12/28 moved to [facility] for LTM EEG 12/31 family updated by neuro: no chance for meaningful recovery, they should make plans for comfort measures 1/1 cultures - resp normal flora 1/2 eeg still with epileptogenicity. Palli fam mtg -- family does not want to hear from medical teams unless there is "good news"  1/3  cEEG with burst suppression w highly epileptiform bursts.  1/4 cEEG with burst suppression  + highly epileptiform bursts still.  WBC slightly up to 22.5. Temp high 99 low 100  c-EEG stopped duie to lack of benefit Pallative care consult - wife upset about early dc from ER on 12/18 Chaplain consult: Wife is sufering from anticipatory grief + accepting god's way + curious/anger about events leading upto current state EEG: generalized epileptogenicity with high potential for seizures as well as profound diffuse encephalopathy. In the setting of cardiac arrest, this EEG pattern is suggestive of anoxic/hypoxic brain injury.  1/5 - Low grade fever +. WEBC 22.5K.  on Zosyn. On vent fio2 30%. TF +. Per RN - diprivan stopped yesterday. No myoclonus today. Mild tachycardia + Rpt pall care on 10/21/22 per wife 1/6 - On vent FiO2 30%.  Tmax 99,66F ., WBC better. Still off diprivan -> No myoclonus. cXR visualized - devices in place and faiure clear Oral oxy and klonpin stopped 1/8 No significant changes in neuro status. Tachycardic to 130s, hypertensive to 140s. Coreg transitioned to metoprolol. 1/11 wife considering trach/peg, to discuss with surgery-discussed with Dr. Bedelia Person 1/12 more tachycardic  Interim History / Subjective:  No overnight events, tachycardia,  Objective:  Blood pressure (!) 130/91, pulse (!) 115, temperature 99.8 F (37.7 C), temperature source Oral, resp. rate (!) 23, height 5\' 4"  (1.626 m), weight 50.4  kg, SpO2 99 %.    Vent Mode: CPAP;PSV FiO2 (%):  [30 %] 30 % Set Rate:  [15 bmp] 15 bmp Vt Set:  [550 mL] 550 mL PEEP:  [5 cmH20] 5 cmH20 Pressure Support:  [10 cmH20] 10 cmH20 Plateau Pressure:  [15 cmH20] 15 cmH20   Intake/Output Summary (Last 24 hours) at 10/29/2022 3614 Last data filed at 10/29/2022 0800 Gross per 24 hour  Intake 2515 ml  Output 701 ml  Net 1814 ml   Filed Weights   10/24/22 0300 10/25/22 0300 10/29/22 0300  Weight: 52.1 kg 51.3 kg 50.4 kg    General: Critically ill-appearing, unresponsive HEENT: Endotracheal tube, OG tube in place Neuro: Does not open eyes, does not follow commands, no cough, no gag, no motor movement, breathing over the vent CV: Tachycardia to 113, S1-S2 appreciated PULM: Coarse breath sounds bilaterally, no other added sounds GI: Bowel sounds appreciated Extremities: Warm and dry, no edema Skin: no rashes or lesions   Resolved   Flu A CAP, pneumococcal pneumonia  S/p Zosyn 12/31 - 1/7; Ceftriaxone 12/20- 12/27, Azithro 12/20, Flagyl 12/20 - 12/22  Assessment & Plan:   Respiratory failure Anoxic brain injury, ventilator dependent Acute respiratory failure with hypoxia/ARDS secondary to flu and strep pneumo pneumonia Completed antibiotics and Tamiflu -Discussions ongoing about trach/PEG -Extubation precluded by mental status -Full vent support to maintain saturations greater than 90% -Elevation of the head of the bed to greater than 30 degrees -Plateau pressures less than 30 -Bronchial hygiene -Fentanyl and oxycodone as needed -Chest PT for secretion clearance  Ongoing goals of care discussion regarding tracheostomy/PEG, need for LTAC-Dr. Jamas Lav did have discussions with family Palliative care also involved Patient deemed a poor candidate for trach PEG given extremely poor prognosis  Severe anoxic injury with seizures s/p cardiac arrest MRI 12/26-symmetric diffusion-weighted and T2 FLAIR hyper intense signal abnormality  within the bilateral basal ganglia.  Subtle, signal changes are also suspected within the bilateral hippocampi.  Given the provided history, this findings are compatible with acute hypoxemic/ischemic injury -Continue antiepileptic drugs including Keppra, valproic acid -Limited prognosis, 25 days postarrest  Concern for ischemic hepatitis with elevated AST/ALT -Hepatitis panel -12/20 -Ammonia of 78, started on lactulose  Brainstorming Paroxysmal sympathetic hyperactivity -On propranolol -On BuSpar  Febrile state Leukocytosis -Lines and Foley were discontinued 1/3-completed 7 days of Zosyn 1/7 -No acute infiltrate on chest x-ray -Negative lower extremity ultrasound for DVT -Continue to follow culture data-drawn 1/13-negative to date -Tracheal aspirate 1/13-no organism  Acute kidney injury with rising BUN Hypernatremia -Continue free water -Trend BMP  Anemia -Transfuse per protocol  Tachycardia/hypertension -Continue propranolol -Hydralazine as needed -Cardiac monitoring  Severe protein calorie malnutrition -Continue nutritional supplementation  Hypoglycemia -Continue blood sugars every 4 -CBG goal of 140-180  GOC documentation previously DNR status Care Giver Distress + (anticiptory grief + upset at outcome +) Remains comatose. Patient's wife has been understandably in great distress over patient's clinical status. Patient's grandson who has autism is struggling greatly with his grandfather's absence. Appreciate Palliative team's input and assistance with management/GOC conversations. - DNR code status at present - Ongoing discussion re: patient and family's wishes in terms of tracheostomy, PEG, LTACH. Wife spoke with surgery 1/12.  - Spoke with wife 1/13. She expressed her concerns with  the entire stay starting at [facility]. >> See note from 1/13 for further information. But to summarize at this time wishes to proceed with trach/peg, no ready to "let him go"   Will  plan to continue to discuss with spouse  Best Practice (right click and "Reselect all SmartList Selections" daily)   Diet/type: tubefeeds DVT prophylaxis: LMWH GI prophylaxis: PPI Foley:  External Foley in place  Code Status:  DNR Last date of multidisciplinary goals of care discussion: ongoing  The patient is critically ill with multiple organ systems failure and requires high complexity decision making for assessment and support, frequent evaluation and titration of therapies, application of advanced monitoring technologies and extensive interpretation of multiple databases. Critical Care Time devoted to patient care services described in this note independent of APP/resident time (if applicable)  is 35 minutes.   Sherrilyn Rist MD [place] Personal pager: See Amion If unanswered, please page CCM On-call: [phone]

## 2022-10-29 NOTE — Progress Notes (Signed)
Daily Progress Note   Patient Name: Damon [name]       Date: 10/29/2022 DOB: [birth year]  Age: 62 y.o. MRN#: [id number] Attending Physician: Kipp Brood, MD Primary Care Physician: [name] Admit Date: 10/03/2022  Reason for Consultation/Follow-up: Establishing goals of care  Patient Profile/HPI: 61 y/o male admitted to [facility] on 12/20 in setting of acute hypoxemic respiratory failure due to Flu A. Placed on BIPAP. Had a 5 minute PEA arrest, required intubation. After several days on mechanical ventilation remains comatose, MRI brain findings worrisome for anoxic brain injury. Moved to [facility] for LTM EEG monitoring.  Palliative care has been asked to get involved for further goals of care conversations in the setting of anoxic brain injury post arrest. 12/31- no improvements, prognosis has been discussed extensively with family by palliative team, neurology and PCCM providers. 1/2- f/u meeting with patient's spouse [name]- she requested time and space as she grapples with processing eventual loss of spouse and how her life will be without him, she is not yet in a place to consider transition to comfort 1/7- propofol dc'd 1/5, no further myoclonus, no other sedating meds, discussed with spouse that decisions are needing to be made re: trach/PEG/LTC vs comfort 1/8 No significant changes in neuro status. Tachycardic to 130s, hypertensive to 140s. Coreg transitioned to metoprolol.   1/11-having some recurrent myoclonus ?neurostorming  Subjective: Chart reviewed including labs, progress notes, imaging from this and previous encounters.  Evaluated Damon [name]. He does not respond to voice or touch. His eyes open intermittently, he doesn't track.  Met at bedside with his spouse, [name].  She  expressed her frustration and anger with interaction with surgery on Friday.  She is committed to her choice of wanting to pursue trach/PEG to allow her more time to sit with Damon [name], even if it means he is not going to wake up and interact with her. She shared her feelings that if she was in Cooper's place, he also would choose to keep her alive on the machines as long as possible. She is hoping that by proceeding with trach/PEG it will serve several purposes- one is that it will give her time to continue hers and her prayer warriors communication with God until God speaks to her or one of them and tells her it is time to let Damon [name]  go. Also, she wants to see his face.  She asked for a list of facilities in [place] that care for ventilated patients. She wishes to start researching the facilities in preparation for his eventual discharge. I printed a list from [email or web address] and gave it to her.     Review of Systems  Unable to perform ROS: Intubated     Physical Exam Vitals and nursing note reviewed.  Cardiovascular:     Rate and Rhythm: Normal rate.  Neurological:     Comments: unresponsive             Vital Signs: BP (!) 88/64   Pulse (!) 114   Temp 100.3 F (37.9 C) (Oral)   Resp (!) 22   Ht 5\' 4"  (1.626 m)   Wt 50.4 kg   SpO2 98%   BMI 19.07 kg/m  SpO2: SpO2: 98 % O2 Device: O2 Device: Ventilator O2 Flow Rate:    Intake/output summary:  Intake/Output Summary (Last 24 hours) at 10/29/2022 1334 Last data filed at 10/29/2022 1300 Gross per 24 hour  Intake 2720 ml  Output 1050 ml  Net 1670 ml    LBM: Last BM Date : 10/29/22 Baseline Weight: Weight: 64.9 kg Most recent weight: Weight: 50.4 kg       Palliative Assessment/Data: PPS: 10%      Patient Active Problem List   Diagnosis Date Noted  . Advanced care planning/counseling discussion 10/21/2022  . On mechanically assisted ventilation ([place]) 10/17/2022  . Anoxic brain damage  ([place]) 10/17/2022  . Anoxic encephalopathy ([place]) 10/16/2022  . Seizure ([place]) 10/16/2022  . Pressure injury of skin 10/16/2022  . Acute hypoxic respiratory failure ([place]) 10/03/2022  . CAP (community acquired pneumonia) 10/03/2022  . Influenza and pneumonia 10/03/2022  . Elevated LFTs 10/03/2022  . HTN (hypertension) 10/03/2022  . HLD (hyperlipidemia) 10/03/2022  . Tobacco abuse 10/03/2022  . SIRS (systemic inflammatory response syndrome) ([place]) 10/03/2022  . Influenza A 10/03/2022  . Cardiac arrest ([place]) 10/03/2022  . Tobacco dependence 10/03/2022    Palliative Care Assessment & Plan    Assessment/Recommendations/Plan  [name] would like to proceed with trach/PEG placement- she has clear understanding of possible path- she is asking when this will be done- I will reach out to attending team and let them know of her request [name] is starting to research ventilator facilities- she requested a list and I printed one from the [facility] website and gave it to her    Code Status: DNR  Prognosis:  Unable to determine  Discharge Planning: To Be Determined  Care plan was discussed with care team and family.  Thank you for allowing the Palliative Medicine Team to assist in the care of this patient.  Total time: 80 minutes  Greater than 50%  of this time was spent counseling and coordinating care related to the above assessment and plan.  [name], AGNP-C Palliative Medicine   Please contact Palliative Medicine Team phone at [phone] for questions and concerns.

## 2022-10-29 NOTE — Ethics Note (Signed)
Ethics Consult Note  Initial contact w/ medical team 10/29/22, which was on patient's hospital day 26   Source of Consult: order placed in Epic by Dr. Ander Slade (pulmonary/critical care)   Current attending physician/service: Kipp Brood, MD  Reason(s) for consult / relevant ethical question(s): Potentially inappropriate treatment being requested by family of patient   Information-gathering: Discussion with source of consult Chart review 10/29/22     Narrative:  Medical situation:  Description of case from person requesting consult: anoxic brain injury (respiratory failure d/t influenza, subsequent PEA arrest) resulting in devastating neurological injury, family is requesting trach/PEG and aggressive life prolonging measures. Medical team has concerns this may be futile medically or may be ethically inappropriate as it may be going against what patient would have wanted for himself.  Patient's decision-making capacity: none- ventilated, brain injury Any formal meetings held: yes. Was an agreement reached? No. Patient's wife has also escalated complaint to administration.  Patient's Personal/Social Situation:  If known: patient's relevant preferences, interests, culture, religion/spirituality, social support, financial concerns, QOL considerations, etc:  per palliative consult 10/13/22 "[patient] lives in a single-family home in [place] with his wife and 2 daughters.  They have been married for the past 37 years.  They share 5 children, 3 of his sons and 1 of Jennifer's daughters, 1 daughter is biologically theirs.  They have 3 grandchildren.  He formally worked at [employer] in [place].  Unfortunately he had endured a back injury therefore has been on disability for a number of years.  He is a man of faith and practices within Christianity, Holiness more specifically.  Per his wife he is identified as independent and extremely loving." "Patient's spouse does agree  that if Magnum were to suffer a cardiac arrest she would not want chest compressions or ACLS medications rather she realizes it would be God calling [place] home." "Reviewed her conversation with the neurology team to try one more intervention to see if his condition can be improved. If not she shares that she will accept his fate. She goes on to tell me that she does not believe that Severus would want to live dependent on others for the duration of his life."  Per Dr Craig Guess note (surgery) 10/26/22 - "[patient's wife] clearly verbalizes that the patient has a high spirit of independence and was previously very active. She reports he had an avoidance of physicians/medical system. She clearly states that he would not choose to live in a nursing facility connected to machines and dependent on others for the remainder of his life, but states that she is choosing trach/peg because she is 'being selfish' and she is 'not going to just pull the plug.'" .Marland KitchenMarland Kitchen"I did advise her that physicians have an obligation to the patient and do not have a requirement to perform procedures they believe to be unethical or futile. This is my first conversation of a significant amount of time with her, but based on the prognosis by the neurology team and my clinical exam, I believe trach/peg may be futile procedures and based on information she provides, I suspect that performance of trach/peg may also be unethical."  Advanced directive, DNR, MOST documents: none If known: other parties' preferences, interests.  Information about authorized surrogate, if any: see above - patient's wife (although Dr Ander Slade mentions she might be exwife?) is decision maker and has stated that the patient's children would defer to her. She is only person in his contacts  Per [name] NP's note (Palliative Care)  10/29/22: "[patient's wife] is committed to her choice of wanting to pursue trach/PEG to allow her more time to sit with Dyami, even if it means he  is not going to wake up and interact with her. She shared her feelings that if she was in Tiwan's place, he also would choose to keep her alive on the machines as long as possible. She is hoping that by proceeding with trach/PEG it will serve several purposes- one is that it will give her time to continue hers and her prayer warriors communication with God until God speaks to her or one of them and tells her it is time to let Eragon go. Also, she wants to see his face."         Recommendations / Ethical Analysis:  There appears to be substantial evidence that this patient, by his wife's own admission, would NOT want his life prolonged artificially. Furthermore, also by her own admission she is making decisions based on her own desires and not her desires to make decisions that he would have made for himself - this is a clear violation of patient autonomy.  If/when a patient lacks capacity, a surrogate must NOT use that opportunity to override the patient's previously stated wishes. If there is substantial evidence that a surrogate decision-maker is requesting medical interventions that are not in alignment with what the patient is known to have wanted, this is a serious concern. It is never ethically appropriate to make decisions directly overriding/reversing what a patient would want for themselves. In this case, based on the above information, it does not appear to be ethically appropriate to prolong this patient's life by artificial means.  To facilitate shared decision making, would consider substantiating the patient's goals/wishes further with his biological children and/or legally adopted children.  Would recommend confirm if Talton Delpriore is his legal spouse as there seems to be some confusion on this point and on brief record review I am unable to locate when/how it was reported that she is reportedly his exwife - if she is NOT legally married to him, she may ONLY make decisions if ALL of his  children defer to her.        Relevant underlying ethical principles were discussed directly with Dr Ander Slade. Other resources were discussed directly with them, if needed. Ethics committee strives to ensure that all necessary and appropriate steps are taken by the medical team such that all decisions made for this patient are ethically appropriate. Please note that Ethics committee members will NOT offer legal or medical counsel.      Thank you for this consult. Ethics will continue to follow this case.   Dr Ander Slade has my personal cell phone number and has my permission to share this number at their discretion.  Secure message on Epic is also welcome but may not receive an immediate response.   Please reference AMION for on-call committee member if needed.    [place]

## 2022-10-30 DIAGNOSIS — J9601 Acute respiratory failure with hypoxia: Secondary | ICD-10-CM | POA: Diagnosis not present

## 2022-10-30 DIAGNOSIS — Z7189 Other specified counseling: Secondary | ICD-10-CM | POA: Diagnosis not present

## 2022-10-30 DIAGNOSIS — G931 Anoxic brain damage, not elsewhere classified: Secondary | ICD-10-CM | POA: Diagnosis not present

## 2022-10-30 LAB — GLUCOSE, CAPILLARY
Glucose-Capillary: 128 mg/dL — ABNORMAL HIGH (ref 70–99)
Glucose-Capillary: 131 mg/dL — ABNORMAL HIGH (ref 70–99)
Glucose-Capillary: 140 mg/dL — ABNORMAL HIGH (ref 70–99)
Glucose-Capillary: 141 mg/dL — ABNORMAL HIGH (ref 70–99)
Glucose-Capillary: 149 mg/dL — ABNORMAL HIGH (ref 70–99)
Glucose-Capillary: 153 mg/dL — ABNORMAL HIGH (ref 70–99)

## 2022-10-30 LAB — BASIC METABOLIC PANEL
Anion gap: 14 (ref 5–15)
BUN: 99 mg/dL — ABNORMAL HIGH (ref 6–20)
CO2: 24 mmol/L (ref 22–32)
Calcium: 9.3 mg/dL (ref 8.9–10.3)
Chloride: 118 mmol/L — ABNORMAL HIGH (ref 98–111)
Creatinine, Ser: 1.05 mg/dL (ref 0.61–1.24)
GFR, Estimated: >60 mL/min (ref >60)
Glucose, Bld: 133 mg/dL — ABNORMAL HIGH (ref 70–99)
Potassium: 4.1 mmol/L (ref 3.5–5.1)
Sodium: 156 mmol/L — ABNORMAL HIGH (ref 135–145)

## 2022-10-30 LAB — CBC
HCT: 34.9 % — ABNORMAL LOW (ref 39.0–52.0)
Hemoglobin: 11 g/dL — ABNORMAL LOW (ref 13.0–17.0)
MCH: 29 pg (ref 26.0–34.0)
MCHC: 31.5 g/dL (ref 30.0–36.0)
MCV: 92.1 fL (ref 80.0–100.0)
Platelets: 264 10*3/uL (ref 150–400)
RBC: 3.79 MIL/uL — ABNORMAL LOW (ref 4.22–5.81)
RDW: 17.2 % — ABNORMAL HIGH (ref 11.5–15.5)
WBC: 22.4 10*3/uL — ABNORMAL HIGH (ref 4.0–10.5)
nRBC: 0.1 % (ref 0.0–0.2)

## 2022-10-30 LAB — CULTURE, RESPIRATORY W GRAM STAIN
Culture: NORMAL
Gram Stain: NONE SEEN

## 2022-10-30 LAB — PHOSPHORUS: Phosphorus: 5.8 mg/dL — ABNORMAL HIGH (ref 2.5–4.6)

## 2022-10-30 LAB — MAGNESIUM: Magnesium: 3.3 mg/dL — ABNORMAL HIGH (ref 1.7–2.4)

## 2022-10-30 MED ORDER — FREE WATER
300.0000 mL | Status: DC
  Administered 2022-10-30 – 2022-11-04 (×25): 300 mL

## 2022-10-30 MED ORDER — DEXTROSE 5 % IV SOLN: INTRAVENOUS | Status: AC

## 2022-10-30 MED ORDER — OSMOLITE 1.5 CAL PO LIQD
1000.0000 mL | ORAL | Status: DC
  Administered 2022-10-30 – 2022-10-31 (×2): 1000 mL

## 2022-10-30 NOTE — Progress Notes (Signed)
Daily Progress Note   Patient Name: Nation Cradle       Date: 10/30/2022 DOB: [birth year]  Age: 62 y.o. MRN#: [id number] Attending Physician: Laurin Coder, MD Primary Care Physician: [facility] Medical Admit Date: 10/03/2022  Reason for Consultation/Follow-up: Establishing goals of care  Patient Profile/HPI:  62 y/o male admitted to [facility] on 12/20 in setting of acute hypoxemic respiratory failure due to Flu A. Placed on BIPAP. Had a 5 minute PEA arrest, required intubation. After several days on mechanical ventilation remains comatose, MRI brain findings worrisome for anoxic brain injury. Moved to [facility] for LTM EEG monitoring.  Palliative care has been asked to get involved for further goals of care conversations in the setting of anoxic brain injury post arrest. 12/31- no improvements, prognosis has been discussed extensively with family by palliative team, neurology and PCCM providers. 1/2- f/u meeting with patient's spouse [name]- she requested time and space as she grapples with processing eventual loss of spouse and how her life will be without him, she is not yet in a place to consider transition to comfort 1/7- propofol dc'd 1/5, no further myoclonus, no other sedating meds, discussed with spouse that decisions are needing to be made re: trach/PEG/LTC vs comfort 1/8 No significant changes in neuro status. Tachycardic to 130s, hypertensive to 140s. Coreg transitioned to metoprolol.   1/11-having some recurrent myoclonus ?neurostorming 1/15-wife requesting trach/PEG  Subjective: Chart reviewed including labs, progress notes, imaging from this and previous encounters.  No family at bedside.  Per RN plan for trach/PEG tomorrow.  Anise Salvo for follow-up. No questions or needs at  this time.   Review of Systems  Unable to perform ROS: Intubated   Physical Exam Vitals and nursing note reviewed.  Constitutional:      Comments: Frail, cachectic  Cardiovascular:     Rate and Rhythm: Normal rate.             Vital Signs: BP 102/86   Pulse (!) 135   Temp 100.2 F (37.9 C) (Axillary)   Resp (!) 31   Ht 5\' 4"  (1.626 m)   Wt 49.1 kg   SpO2 100%   BMI 18.58 kg/m  SpO2: SpO2: 100 % O2 Device: O2 Device: Ventilator O2 Flow Rate:    Intake/output summary:  Intake/Output Summary (Last 24 hours) at  10/30/2022 1449 Last data filed at 10/30/2022 1400 Gross per 24 hour  Intake 1984.66 ml  Output 1300 ml  Net 684.66 ml   LBM: Last BM Date : 10/30/22 Baseline Weight: Weight: 64.9 kg Most recent weight: Weight: 49.1 kg       Palliative Assessment/Data: PPS: 10%      Patient Active Problem List   Diagnosis Date Noted   Advanced care planning/counseling discussion 10/21/2022   On mechanically assisted ventilation ([place]) 10/17/2022   Anoxic brain damage ([place]) 10/17/2022   Anoxic encephalopathy ([place]) 10/16/2022   Seizure ([place]) 10/16/2022   Pressure injury of skin 10/16/2022   Acute hypoxic respiratory failure ([place]) 10/03/2022   CAP (community acquired pneumonia) 10/03/2022   Influenza and pneumonia 10/03/2022   Elevated LFTs 10/03/2022   HTN (hypertension) 10/03/2022   HLD (hyperlipidemia) 10/03/2022   Tobacco abuse 10/03/2022   SIRS (systemic inflammatory response syndrome) ([place]) 10/03/2022   Influenza A 10/03/2022   Cardiac arrest ([place]) 10/03/2022   Tobacco dependence 10/03/2022    Palliative Care Assessment & Plan    Assessment/Recommendations/Plan  Continue current plan Likely trach/PEG tomorrow PMT will continue to follow   Code Status: DNR  Prognosis:  Unable to determine  Discharge Planning: To Be Determined  Care plan was discussed with spouse and care team.   Thank you for allowing the Palliative Medicine Team to assist in  the care of this patient.  Total time: 45 minutes  Greater than 50%  of this time was spent counseling and coordinating care related to the above assessment and plan.  [name], AGNP-C Palliative Medicine   Please contact Palliative Medicine Team phone at [phone] for questions and concerns.

## 2022-10-30 NOTE — Progress Notes (Signed)
Nutrition Follow-up  DOCUMENTATION CODES:   Not applicable  INTERVENTION:   Tube feeding via OG tube:  Increase Osmolite 1.5 to 65 ml/h (1560 ml per day) Prosource TF20 60 ml BID  Provides 2500 kcal, 137 gm protein, 1185 ml free water daily  300 ml free water every 4 hours  Total free water: 2188 ml   NUTRITION DIAGNOSIS:   Inadequate oral intake related to acute illness (respiratory failure/ventilated) as evidenced by NPO status. Ongoing.   GOAL:   Patient will meet greater than or equal to 90% of their needs Met with TF at goal.   MONITOR:   Vent status, Labs, Weight trends, TF tolerance  REASON FOR ASSESSMENT:   Consult Enteral/tube feeding initiation and management  ASSESSMENT:   Pt admitted from home with the flu leading to acute respiratory failure with hypoxia and PEA arrest upon admission. PMH significant for HTN, HLD, chronic back pain, tobacco use.  Pt discussed during ICU rounds and with RN.  Pt started on lactulose due to increased ammonia of 78  CCM discussing GOC with family due to severe anoxic injury. Palliative care following.  Weight decreasing Current weight: 49.1 kg Admission weight: 64.9 kg  Medications reviewed and include: pepcid, banatrol TF, lactulose every 8 hours   Labs reviewed: Na 156 PO4: 5.8 Magnesium: 3.3 Ammonia: 78 CBG's: 107-153    Diet Order:   Diet Order             Diet NPO time specified  Diet effective now                   EDUCATION NEEDS:   No education needs have been identified at this time  Skin:  Skin Assessment: Skin Integrity Issues: Skin Integrity Issues:: Stage II Stage II: sacrum  Last BM:  350 ml via FMS on lactulose  Height:   Ht Readings from Last 1 Encounters:  10/11/22 5\' 4"  (1.626 m)    Weight:   Wt Readings from Last 1 Encounters:  10/30/22 49.1 kg   BMI:  Body mass index is 18.58 kg/m.  Estimated Nutritional Needs:   Kcal:  1900-2100  Protein:   95-110g  Fluid:  >/=1.9L  Jaryd Drew P., RD, LDN, CNSC See AMiON for contact information

## 2022-10-30 NOTE — Progress Notes (Signed)
NAME:  Damon [name], MRN:  [id number], DOB:  1961/01/02, LOS: 27 ADMISSION DATE:  10/03/2022, CONSULTATION DATE:  10/03/2022 REFERRING MD:  Ronaldo Miyamoto - TRH CHIEF COMPLAINT:  Dyspnea   History of Present Illness:  62 year old man admitted to [facility] 12/20 in the setting of acute hypoxemic respiratory failure due to Flu A. PMHx significant for HTN, HLD, chronic back pain, tobacco abuse   Placed on BIPAP.  Coded (likely in the setting of hypoxia), initial rhythm PEA with CPR x 5 minutes, required intubation.  After several days on mechanical ventilation remains comatose, MRI Brain 12/26 worrisome for anoxic brain injury.  Transferred to [facility] for LTM EEG monitoring.  Neurology consulted. LTM with generalized epileptogenicity. Treated with Propofol, Depakote, Keppra, Ketamine. 12/31 Neurology with goals of care with family. Relayed that patient has a grim neurological recovery due to irreversible anoxic injury. Family wished to continue aggressive measures.   Palliative care consulted. Stay complicated with neuro-storming.   Surgical consult regarding PEG/trach  Pertinent Medical History:   Past Medical History:  Diagnosis Date   Elevated lipids    Hypertension    Tobacco abuse    Significant Hospital Events: Including procedures, antibiotic start and stop dates in addition to other pertinent events   12/18 Flu A positive 12/20 Admitted, 5 minute code after non-compliance with BiPAP MRSA PCR - neg Urine strep _POS 12/21 Intubated, paralyzed, ARDS protocol, on 7cc/kg 12/22 Weaned to 6 cc/kg with dyssynchrony, back on 7 cc/kg, driving pressures good, weaning vent 12/23 Attempt to wean sedation again with dyssynchrony and desaturation.  Some concern for cuff leak, tube exchange.  Cuff did not appear to be blown.  Ongoing signs of cuff leak, query leaking around cough, possible large trachea, diuresing 12/25 tolerating PSV, myoclonus> ceribell, Neuro consult, changed to propofol, diuresed  12/26  Changed to DNR. On Propofol, versed.  Tolerating PSV.  MRI brain with hypoxic/anoxic injury Blood culture neg 12/27 GPD's on EEG, pending transfer to [facility] for cEEG NEURO CONSULT 12/28 moved to [facility] for LTM EEG 12/31 family updated by neuro: no chance for meaningful recovery, they should make plans for comfort measures 1/1 cultures - resp normal flora 1/2 eeg still with epileptogenicity. Palli fam mtg -- family does not want to hear from medical teams unless there is "good news"  1/3  cEEG with burst suppression w highly epileptiform bursts.  1/4 cEEG with burst suppression  + highly epileptiform bursts still.  WBC slightly up to 22.5. Temp high 99 low 100  c-EEG stopped duie to lack of benefit Pallative care consult - wife upset about early dc from ER on 12/18 Chaplain consult: Wife is sufering from anticipatory grief + accepting god's way + curious/anger about events leading upto current state EEG: generalized epileptogenicity with high potential for seizures as well as profound diffuse encephalopathy. In the setting of cardiac arrest, this EEG pattern is suggestive of anoxic/hypoxic brain injury.  1/5 - Low grade fever +. WEBC 22.5K.  on Zosyn. On vent fio2 30%. TF +. Per RN - diprivan stopped yesterday. No myoclonus today. Mild tachycardia + Rpt pall care on 10/21/22 per wife 1/6 - On vent FiO2 30%.  Tmax 99,61F ., WBC better. Still off diprivan -> No myoclonus. cXR visualized - devices in place and faiure clear Oral oxy and klonpin stopped 1/8 No significant changes in neuro status. Tachycardic to 130s, hypertensive to 140s. Coreg transitioned to metoprolol. 1/11 wife considering trach/peg, to discuss with surgery-discussed with Dr. Bedelia Person 1/12 more tachycardic 1/15  Interim History / Subjective:  No change in status Still with tachycardia  Objective:  Blood pressure 123/86, pulse (!) 116, temperature 98.6 F (37 C), temperature source Axillary, resp. rate (!) 27, height 5\' 4"  (1.626 m),  weight 49.1 kg, SpO2 98 %.    Vent Mode: CPAP;PSV FiO2 (%):  [30 %] 30 % Set Rate:  [15 bmp] 15 bmp Vt Set:  [550 mL] 550 mL PEEP:  [5 cmH20] 5 cmH20 Pressure Support:  [8 cmH20-10 cmH20] 8 cmH20 Plateau Pressure:  [13 cmH20-16 cmH20] 14 cmH20   Intake/Output Summary (Last 24 hours) at 10/30/2022 0907 Last data filed at 10/30/2022 0800 Gross per 24 hour  Intake 1810 ml  Output 1550 ml  Net 260 ml   Filed Weights   10/25/22 0300 10/29/22 0300 10/30/22 0300  Weight: 51.3 kg 50.4 kg 49.1 kg    General: Critically ill-appearing, unresponsive HEENT: Endotracheal tube in place, orogastric tube in place Neuro: Does not open eyes, does not follow commands, no cough, no gag, no movement.  He is breathing over the vent  CV: Tachycardia to 113, S1-S2 appreciated PULM: Coarse breath sounds bilaterally GI: Bowel sounds appreciated Extremities: Skin is warm and dry Skin: no rashes or lesions   Resolved   Flu A CAP, pneumococcal pneumonia  S/p Zosyn 12/31 - 1/7; Ceftriaxone 12/20- 12/27, Azithro 12/20, Flagyl 12/20 - 12/22  Assessment & Plan:   Respiratory failure Anoxic brain injury, ventilator dependent at present Acute respiratory failure with hypoxia/ARDS secondary to flu and strep pneumo pneumonia Did complete a course of antibiotics and Tamiflu -Ongoing discussions regarding trach/PEG -Extubation precluded by patient's mental status -Continue full vent support, maintain head of the bed greater than 30 degrees -Maintain saturations greater than 90 -Plateau pressure less than 30 -Continue bronchial hygiene -Fentanyl and oxycodone as needed  Ongoing goals of care discussion regarding tracheostomy/PEG Will need LTAC versus skilled nursing facility as prognosis is poor Patient was deemed a poor candidate for trach/PEG given poor prognosis  Paroxysmal sympathetic hyperactivity -On propranolol -On BuSpar  Febrile state Leukocytosis-22.4 Tmax of 100.9 -Lines and Foley were  discontinued 1/3-completed 7 days of Zosyn 1/7 No acute infiltrate on chest x-ray -Negative lower extremity ultrasound for DVT -Tracheal aspirate 1/13 negative -Blood cultures 1/13 negative to date  Acute kidney injury with rising BUN Hypernatremia -Continue free water-increased to 300 cc -Give 250 cc of D5 water -Trend BMP  Anemia Transfuse per protocol  Tachycardia/hypertension -Continue propranolol -Hydralazine as needed -Continue cardiac monitoring  Severe protein calorie malnutrition -Continue nutritional supplementation  Continue SSI for hyperglycemia  I did reach out for ethics committee advise 1/15  Was unable to have a conversation with spouse 1/15, did leave a message  [place] documentation previously DNR status Care Giver Distress + (anticiptory grief + upset at outcome +) Remains comatose. Patient's wife has been understandably in great distress over patient's clinical status. Patient's grandson who has autism is struggling greatly with his grandfather's absence. Appreciate Palliative team's input and assistance with management/GOC conversations. - DNR code status at present - Ongoing discussion re: patient and family's wishes in terms of tracheostomy, PEG, LTACH. Wife spoke with surgery 1/12.  - Spoke with wife 1/13. She expressed her concerns with the entire stay starting at [facility]. >> See note from 1/13 for further information. But to summarize at this time wishes to proceed with trach/peg, no ready to "let him go"    Best Practice (right click and "Reselect all SmartList Selections" daily)  Diet/type: tubefeeds DVT prophylaxis: LMWH GI prophylaxis: PPI Foley:  External Foley in place  Code Status:  DNR Last date of multidisciplinary goals of care discussion: ongoing  The patient is critically ill with multiple organ systems failure and requires high complexity decision making for assessment and support, frequent evaluation and titration of therapies,  application of advanced monitoring technologies and extensive interpretation of multiple databases. Critical Care Time devoted to patient care services described in this note independent of APP/resident time (if applicable)  is 35 minutes.   Sherrilyn Rist MD [place] [facility] Critical [facility] Personal pager: See Amion If unanswered, please page CCM On-call: [phone]

## 2022-10-30 NOTE — Progress Notes (Signed)
Patient swapped back over to [facility] on the vent at this time due to respirations in the mid 30s.  RT will be made aware.

## 2022-10-31 DIAGNOSIS — J9601 Acute respiratory failure with hypoxia: Secondary | ICD-10-CM | POA: Diagnosis not present

## 2022-10-31 LAB — BASIC METABOLIC PANEL
Anion gap: 11 (ref 5–15)
BUN: 71 mg/dL — ABNORMAL HIGH (ref 6–20)
CO2: 25 mmol/L (ref 22–32)
Calcium: 9.1 mg/dL (ref 8.9–10.3)
Chloride: 117 mmol/L — ABNORMAL HIGH (ref 98–111)
Creatinine, Ser: 0.87 mg/dL (ref 0.61–1.24)
GFR, Estimated: >60 mL/min (ref >60)
Glucose, Bld: 117 mg/dL — ABNORMAL HIGH (ref 70–99)
Potassium: 4.6 mmol/L (ref 3.5–5.1)
Sodium: 153 mmol/L — ABNORMAL HIGH (ref 135–145)

## 2022-10-31 LAB — CBC WITH DIFFERENTIAL/PLATELET
Abs Immature Granulocytes: 0.25 10*3/uL — ABNORMAL HIGH (ref 0.00–0.07)
Basophils Absolute: 0.1 10*3/uL (ref 0.0–0.1)
Basophils Relative: 1 %
Eosinophils Absolute: 0.4 10*3/uL (ref 0.0–0.5)
Eosinophils Relative: 2 %
HCT: 36.9 % — ABNORMAL LOW (ref 39.0–52.0)
Hemoglobin: 11.6 g/dL — ABNORMAL LOW (ref 13.0–17.0)
Immature Granulocytes: 1 %
Lymphocytes Relative: 9 %
Lymphs Abs: 1.6 10*3/uL (ref 0.7–4.0)
MCH: 28.9 pg (ref 26.0–34.0)
MCHC: 31.4 g/dL (ref 30.0–36.0)
MCV: 92 fL (ref 80.0–100.0)
Monocytes Absolute: 1.4 10*3/uL — ABNORMAL HIGH (ref 0.1–1.0)
Monocytes Relative: 8 %
Neutro Abs: 14.7 10*3/uL — ABNORMAL HIGH (ref 1.7–7.7)
Neutrophils Relative %: 79 %
Platelets: 219 10*3/uL (ref 150–400)
RBC: 4.01 MIL/uL — ABNORMAL LOW (ref 4.22–5.81)
RDW: 17.2 % — ABNORMAL HIGH (ref 11.5–15.5)
WBC: 18.4 10*3/uL — ABNORMAL HIGH (ref 4.0–10.5)
nRBC: 0.2 % (ref 0.0–0.2)

## 2022-10-31 LAB — SURGICAL PCR SCREEN
MRSA, PCR: NEGATIVE
Staphylococcus aureus: NEGATIVE

## 2022-10-31 LAB — GLUCOSE, CAPILLARY
Glucose-Capillary: 108 mg/dL — ABNORMAL HIGH (ref 70–99)
Glucose-Capillary: 122 mg/dL — ABNORMAL HIGH (ref 70–99)
Glucose-Capillary: 125 mg/dL — ABNORMAL HIGH (ref 70–99)
Glucose-Capillary: 125 mg/dL — ABNORMAL HIGH (ref 70–99)
Glucose-Capillary: 137 mg/dL — ABNORMAL HIGH (ref 70–99)
Glucose-Capillary: 142 mg/dL — ABNORMAL HIGH (ref 70–99)

## 2022-10-31 MED ORDER — DEXTROSE 5 % IV SOLN: INTRAVENOUS | Status: AC

## 2022-10-31 MED ORDER — MEDIHONEY WOUND/BURN DRESSING EX PSTE
1.0000 | PASTE | Freq: Every day | CUTANEOUS | Status: DC
  Administered 2022-11-01 – 2022-11-03 (×3): 1 via TOPICAL
  Filled 2022-10-31: qty 44

## 2022-10-31 MED ORDER — CHLORHEXIDINE GLUCONATE CLOTH 2 % EX PADS
6.0000 | MEDICATED_PAD | Freq: Once | CUTANEOUS | Status: AC
  Administered 2022-11-01: 6 via TOPICAL

## 2022-10-31 MED ORDER — DEXTROSE IN LACTATED RINGERS 5 % IV SOLN: INTRAVENOUS | Status: AC

## 2022-10-31 MED ORDER — ENOXAPARIN SODIUM 40 MG/0.4ML IJ SOSY
40.0000 mg | PREFILLED_SYRINGE | INTRAMUSCULAR | Status: DC
  Administered 2022-11-01 – 2023-01-21 (×82): 40 mg via SUBCUTANEOUS
  Filled 2022-10-31 (×82): qty 0.4

## 2022-10-31 NOTE — Progress Notes (Signed)
Sacral wound documentation

## 2022-10-31 NOTE — Progress Notes (Signed)
NAME:  Damon [name], MRN:  [id number], DOB:  12-28-1960, LOS: 28 ADMISSION DATE:  10/03/2022, CONSULTATION DATE:  10/03/2022 REFERRING MD:  Ronaldo Miyamoto - TR[facility] CHIEF COMPLAINT:  Dyspnea   History of Present Illness:  62 year old man admitted to [facility] 12/20 in the setting of acute hypoxemic respiratory failure due to Flu A. PMHx significant for HTN, HLD, chronic back pain, tobacco abuse   Placed on BIPAP.  Coded (likely in the setting of hypoxia), initial rhythm PEA with CPR x 5 minutes, required intubation.  After several days on mechanical ventilation remains comatose, MRI Brain 12/26 worrisome for anoxic brain injury.  Transferred to [facility] for LTM EEG monitoring.  Neurology consulted. LTM with generalized epileptogenicity. Treated with Propofol, Depakote, Keppra, Ketamine. 12/31 Neurology with goals of care with family. Relayed that patient has a grim neurological recovery due to irreversible anoxic injury. Family wished to continue aggressive measures.   Palliative care consulted. Stay complicated with neuro-storming.   Surgical consult regarding PEG/trach  Pertinent Medical History:   Past Medical History:  Diagnosis Date   Elevated lipids    Hypertension    Tobacco abuse    Significant Hospital Events: Including procedures, antibiotic start and stop dates in addition to other pertinent events   12/18 Flu A positive 12/20 Admitted, 5 minute code after non-compliance with BiPAP MRSA PCR - neg Urine strep _POS 12/21 Intubated, paralyzed, ARDS protocol, on 7cc/kg 12/22 Weaned to 6 cc/kg with dyssynchrony, back on 7 cc/kg, driving pressures good, weaning vent 12/23 Attempt to wean sedation again with dyssynchrony and desaturation.  Some concern for cuff leak, tube exchange.  Cuff did not appear to be blown.  Ongoing signs of cuff leak, query leaking around cough, possible large trachea, diuresing 12/25 tolerating PSV, myoclonus> ceribell, Neuro consult, changed to propofol, diuresed  12/26  Changed to DNR. On Propofol, versed.  Tolerating PSV.  MRI brain with hypoxic/anoxic injury Blood culture neg 12/27 GPD's on EEG, pending transfer to [facility] for cEEG NEURO CONSULT 12/28 moved to [facility] for LTM EEG 12/31 family updated by neuro: no chance for meaningful recovery, they should make plans for comfort measures 1/1 cultures - resp normal flora 1/2 eeg still with epileptogenicity. Palli fam mtg -- family does not want to hear from medical teams unless there is "good news"  1/3  cEEG with burst suppression w highly epileptiform bursts.  1/4 cEEG with burst suppression  + highly epileptiform bursts still.  WBC slightly up to 22.5. Temp high 99 low 100  c-EEG stopped duie to lack of benefit Pallative care consult - wife upset about early dc from ER on 12/18 Chaplain consult: Wife is sufering from anticipatory grief + accepting god's way + curious/anger about events leading upto current state EEG: generalized epileptogenicity with high potential for seizures as well as profound diffuse encephalopathy. In the setting of cardiac arrest, this EEG pattern is suggestive of anoxic/hypoxic brain injury.  1/5 - Low grade fever +. WEBC 22.5K.  on Zosyn. On vent fio2 30%. TF +. Per RN - diprivan stopped yesterday. No myoclonus today. Mild tachycardia + Rpt pall care on 10/21/22 per wife 1/6 - On vent FiO2 30%.  Tmax 99,29F ., WBC better. Still off diprivan -> No myoclonus. cXR visualized - devices in place and faiure clear Oral oxy and klonpin stopped 1/8 No significant changes in neuro status. Tachycardic to 130s, hypertensive to 140s. Coreg transitioned to metoprolol. 1/11 wife considering trach/peg, to discuss with surgery-discussed with Dr. Bedelia Person 1/12 more tachycardic 1/16-spoke  with wife at length at bedside, Dr. Hulen Skains also had a long conversation with spouse-desires to continue current aggressive care and desires PEG and trach placed  Interim History / Subjective:  No overnight change in  status  Objective:  Blood pressure 100/79, pulse 98, temperature 99 F (37.2 C), temperature source Axillary, resp. rate (!) 22, height 5\' 4"  (1.626 m), weight 50.6 kg, SpO2 98 %.    Vent Mode: PRVC FiO2 (%):  [30 %] 30 % Set Rate:  [15 bmp] 15 bmp Vt Set:  [550 mL] 550 mL PEEP:  [5 cmH20] 5 cmH20 Pressure Support:  [8 cmH20] 8 cmH20 Plateau Pressure:  [15 cmH20-16 cmH20] 15 cmH20   Intake/Output Summary (Last 24 hours) at 10/31/2022 1093 Last data filed at 10/31/2022 0800 Gross per 24 hour  Intake 2629.68 ml  Output 2075 ml  Net 554.68 ml   Filed Weights   10/29/22 0300 10/30/22 0300 10/31/22 0500  Weight: 50.4 kg 49.1 kg 50.6 kg    General: Critically ill-appearing, unresponsive, eyes open HEENT: Endotracheal tube in place, orogastric tube in place Neuro: Opens eyes, does not follow commands, does have a cough, breathing over the vent  CV: Tachycardia, S1-S2 appreciated PULM: Coarse breath sounds bilaterally GI: Bowel sounds appreciated Extremities: Skin is warm and dry Skin: No rashes   Resolved   Flu A CAP, pneumococcal pneumonia  S/p Zosyn 12/31 - 1/7; Ceftriaxone 12/20- 12/27, Azithro 12/20, Flagyl 12/20 - 12/22  Assessment & Plan:   Respiratory failure Anoxic brain injury, ventilator dependent at present Acute respiratory failure with hypoxia/ARDS secondary to flu and strep pneumo pneumonia Did complete a course of antibiotics and Tamiflu -Ongoing discussions about trach/PEG-tentatively scheduled for 11/01/2022 at about midday -Extubation precluded by patient's mental status -Continue full ventilator support, maintain head of the bed greater than 30 degrees, maintain saturations greater than 90, plateau pressure less than 30 -Continue bronchial hygiene  Did not wean well today, increased work of breathing as soon as it was switched to pressure support ventilation  Paroxysmal sympathetic hyperactivity -On propranolol -On BuSpar-discontinue -may add  clonidine if needed  Febrile state Leukocytosis-down to 18.4 from 22.4 Tmax 100.2-appears to be trending down as well -Lines and Foley were discontinued 1/3 -Completed 7 days of Zosyn -Negative lower extremity ultrasound for DVT Tracheal aspirate 1/30 negative Blood cultures -1/13  Acute kidney injury with rising BUN Hypernatremia -Continue free water 300 cc -Did receive D5 water, will give another infusion of D5 water 500 cc today -Continue to trend BMP  Anemia -Transfuse per protocol  Tachycardia/hypotension due to paroxysmal sympathetic hyperactivity -Continue propranolol -Continue cardiac monitoring -Hydralazine as needed  Severe protein calorie malnutrition -Continue nutritional supplementation  SSI for hyperglycemia  Tracheostomy/PEG tentatively scheduled for 11/01/2022 about midday  GOC documentation previously DNR status Care Giver Distress + (anticiptory grief + upset at outcome +) Remains comatose. Patient's wife has been understandably in great distress over patient's clinical status. Patient's grandson who has autism is struggling greatly with his grandfather's absence. Appreciate Palliative team's input and assistance with management/GOC conversations. - DNR code status at present - Ongoing discussion re: patient and family's wishes in terms of tracheostomy, PEG, LTACH. Wife spoke with surgery 1/12.  - Spoke with wife 1/13. She expressed her concerns with the entire stay starting at [facility]. >> See note from 1/13 for further information. But to summarize at this time wishes to proceed with trach/peg, no ready to "let him go"  1/16-had extensive conversation with spouse, Dr. Sherlynn Stalls  also did have an extensive conversation with spouse-he remains DNR, continue full scope of care  Best Practice (right click and "Reselect all SmartList Selections" daily)   Diet/type: tubefeeds DVT prophylaxis: LMWH GI prophylaxis: PPI Foley:  External Foley in place  Code  Status:  DNR Last date of multidisciplinary goals of care discussion: ongoing  The patient is critically ill with multiple organ systems failure and requires high complexity decision making for assessment and support, frequent evaluation and titration of therapies, application of advanced monitoring technologies and extensive interpretation of multiple databases. Critical Care Time devoted to patient care services described in this note independent of APP/resident time (if applicable)  is 35 minutes.   Sherrilyn Rist MD [place] [facility] Critical [facility] Personal pager: See Amion If unanswered, please page CCM On-call: [phone]

## 2022-10-31 NOTE — Progress Notes (Signed)
Updated spouse at bedside  Procedures tentatively scheduled for tomorrow about midday

## 2022-10-31 NOTE — Consult Note (Addendum)
[place] Nurse Consult Note: Reason for Consult: Consult requested for sacrum. Performed remotely after review of progress notes and photos in the EMR.  Wound type: Chronic Unstageable pressure injury; 50% pink, 50% red. Pressure Injury POA:Yes; noted upon transfer from [facility] to [facility] Measurement: According to the bedside nursing wound flow sheet; 3X1X.5cm  Dressing procedure/placement/frequency: Topical treatment orders provided for bedside nurses to perform as follows to assist with removal of nonviable tissue: Apply Medihoney to sacrum wound Q day, then cover with foam dressing.  (Change foam dressing Q 3 days or PRN soiling.) Please re-consult if further assistance is needed.  Thank-you,  Julien Girt MSN, [place], [place], [place], [place]

## 2022-10-31 NOTE — Progress Notes (Signed)
Entered patient room to find patient awake with eyes open and left shoulder repeatedly twitching. Patient also tachypneic with RR in the 30's and slightly more tachycardic with HR in the low 100's. Episode lasted less than 1 minute and patient fell asleep. Vitals back WNL. Notified MD with no new orders at this time.

## 2022-10-31 NOTE — Progress Notes (Addendum)
V.O., "to give propanolol and depakene both early, at 0200. Then to make pt NPO", per Dr. Ander Slade. Pt's consent for O.R. visit 10/31/22 also to be gathered by morning team per MD.

## 2022-10-31 NOTE — Progress Notes (Signed)
Palliative-   Chart reviewed.  No changes today.  Plan for trach/PEG tomorrow.   [name], AGNP-C Palliative Medicine  No charge

## 2022-10-31 NOTE — Consult Note (Signed)
I have placed a request via Secure Chat to Dr. Ander Slade  requesting photos of the wound areas of concern to be placed in the EMR.     [place], [place], [place]

## 2022-11-01 ENCOUNTER — Inpatient Hospital Stay (HOSPITAL_COMMUNITY): Payer: Medicare Other | Admitting: Certified Registered"

## 2022-11-01 ENCOUNTER — Encounter (HOSPITAL_COMMUNITY)
Admission: EM | Disposition: A | Discharge status: Skilled Nursing Facility | Payer: Self-pay | Source: Home / Self Care | Attending: Family Medicine

## 2022-11-01 ENCOUNTER — Other Ambulatory Visit (HOSPITAL_COMMUNITY): Payer: Medicare Other

## 2022-11-01 ENCOUNTER — Inpatient Hospital Stay (HOSPITAL_COMMUNITY): Payer: Medicare Other

## 2022-11-01 ENCOUNTER — Other Ambulatory Visit: Payer: Self-pay

## 2022-11-01 DIAGNOSIS — Z9911 Dependence on respirator [ventilator] status: Secondary | ICD-10-CM | POA: Diagnosis not present

## 2022-11-01 DIAGNOSIS — J189 Pneumonia, unspecified organism: Secondary | ICD-10-CM | POA: Diagnosis not present

## 2022-11-01 DIAGNOSIS — I10 Essential (primary) hypertension: Secondary | ICD-10-CM | POA: Diagnosis not present

## 2022-11-01 DIAGNOSIS — D649 Anemia, unspecified: Secondary | ICD-10-CM

## 2022-11-01 DIAGNOSIS — F17210 Nicotine dependence, cigarettes, uncomplicated: Secondary | ICD-10-CM

## 2022-11-01 DIAGNOSIS — J9601 Acute respiratory failure with hypoxia: Secondary | ICD-10-CM | POA: Diagnosis not present

## 2022-11-01 DIAGNOSIS — J9690 Respiratory failure, unspecified, unspecified whether with hypoxia or hypercapnia: Secondary | ICD-10-CM

## 2022-11-01 DIAGNOSIS — Z7189 Other specified counseling: Secondary | ICD-10-CM | POA: Diagnosis not present

## 2022-11-01 HISTORY — PX: GASTROSTOMY: SHX5249

## 2022-11-01 HISTORY — PX: TRACHEOSTOMY TUBE PLACEMENT: SHX814

## 2022-11-01 LAB — GLUCOSE, CAPILLARY
Glucose-Capillary: 102 mg/dL — ABNORMAL HIGH (ref 70–99)
Glucose-Capillary: 109 mg/dL — ABNORMAL HIGH (ref 70–99)
Glucose-Capillary: 112 mg/dL — ABNORMAL HIGH (ref 70–99)
Glucose-Capillary: 114 mg/dL — ABNORMAL HIGH (ref 70–99)
Glucose-Capillary: 118 mg/dL — ABNORMAL HIGH (ref 70–99)
Glucose-Capillary: 121 mg/dL — ABNORMAL HIGH (ref 70–99)
Glucose-Capillary: 139 mg/dL — ABNORMAL HIGH (ref 70–99)

## 2022-11-01 LAB — CBC WITH DIFFERENTIAL/PLATELET
Abs Immature Granulocytes: 0.29 10*3/uL — ABNORMAL HIGH (ref 0.00–0.07)
Basophils Absolute: 0.1 10*3/uL (ref 0.0–0.1)
Basophils Relative: 1 %
Eosinophils Absolute: 0.4 10*3/uL (ref 0.0–0.5)
Eosinophils Relative: 3 %
HCT: 31 % — ABNORMAL LOW (ref 39.0–52.0)
Hemoglobin: 9.7 g/dL — ABNORMAL LOW (ref 13.0–17.0)
Immature Granulocytes: 2 %
Lymphocytes Relative: 9 %
Lymphs Abs: 1.4 10*3/uL (ref 0.7–4.0)
MCH: 28.5 pg (ref 26.0–34.0)
MCHC: 31.3 g/dL (ref 30.0–36.0)
MCV: 91.2 fL (ref 80.0–100.0)
Monocytes Absolute: 1.2 10*3/uL — ABNORMAL HIGH (ref 0.1–1.0)
Monocytes Relative: 8 %
Neutro Abs: 11.8 10*3/uL — ABNORMAL HIGH (ref 1.7–7.7)
Neutrophils Relative %: 77 %
Platelets: 212 10*3/uL (ref 150–400)
RBC: 3.4 MIL/uL — ABNORMAL LOW (ref 4.22–5.81)
RDW: 16.7 % — ABNORMAL HIGH (ref 11.5–15.5)
WBC: 15 10*3/uL — ABNORMAL HIGH (ref 4.0–10.5)
nRBC: 0.1 % (ref 0.0–0.2)

## 2022-11-01 LAB — CULTURE, BLOOD (ROUTINE X 2)
Culture: NO GROWTH
Culture: NO GROWTH
Special Requests: ADEQUATE

## 2022-11-01 LAB — COMPREHENSIVE METABOLIC PANEL
ALT: 179 U/L — ABNORMAL HIGH (ref 0–44)
AST: 97 U/L — ABNORMAL HIGH (ref 15–41)
Albumin: 2.2 g/dL — ABNORMAL LOW (ref 3.5–5.0)
Alkaline Phosphatase: 79 U/L (ref 38–126)
Anion gap: 9 (ref 5–15)
BUN: 50 mg/dL — ABNORMAL HIGH (ref 6–20)
CO2: 27 mmol/L (ref 22–32)
Calcium: 9 mg/dL (ref 8.9–10.3)
Chloride: 113 mmol/L — ABNORMAL HIGH (ref 98–111)
Creatinine, Ser: 0.66 mg/dL (ref 0.61–1.24)
GFR, Estimated: >60 mL/min (ref >60)
Glucose, Bld: 108 mg/dL — ABNORMAL HIGH (ref 70–99)
Potassium: 3.9 mmol/L (ref 3.5–5.1)
Sodium: 149 mmol/L — ABNORMAL HIGH (ref 135–145)
Total Bilirubin: 0.4 mg/dL (ref 0.3–1.2)
Total Protein: 6.9 g/dL (ref 6.5–8.1)

## 2022-11-01 LAB — PHOSPHORUS: Phosphorus: 4.8 mg/dL — ABNORMAL HIGH (ref 2.5–4.6)

## 2022-11-01 LAB — AMMONIA: Ammonia: 28 umol/L (ref 9–35)

## 2022-11-01 LAB — MAGNESIUM: Magnesium: 2.6 mg/dL — ABNORMAL HIGH (ref 1.7–2.4)

## 2022-11-01 SURGERY — CREATION, TRACHEOSTOMY
Anesthesia: General | Site: Neck

## 2022-11-01 MED ORDER — FENTANYL CITRATE (PF) 250 MCG/5ML IJ SOLN
INTRAMUSCULAR | Status: DC | PRN
  Administered 2022-11-01: 50 ug via INTRAVENOUS

## 2022-11-01 MED ORDER — FENTANYL CITRATE (PF) 250 MCG/5ML IJ SOLN
INTRAMUSCULAR | Status: AC
  Filled 2022-11-01: qty 5

## 2022-11-01 MED ORDER — OSMOLITE 1.5 CAL PO LIQD
1000.0000 mL | ORAL | Status: DC
  Administered 2022-11-01 – 2022-11-04 (×5): 1000 mL
  Filled 2022-11-01: qty 1185

## 2022-11-01 MED ORDER — BUPIVACAINE-EPINEPHRINE (PF) 0.5% -1:200000 IJ SOLN
INTRAMUSCULAR | Status: AC
  Filled 2022-11-01: qty 30

## 2022-11-01 MED ORDER — LACTATED RINGERS IV SOLN: INTRAVENOUS | Status: DC | PRN

## 2022-11-01 MED ORDER — ONDANSETRON HCL 4 MG/2ML IJ SOLN
INTRAMUSCULAR | Status: DC | PRN
  Administered 2022-11-01: 4 mg via INTRAVENOUS

## 2022-11-01 MED ORDER — CEFAZOLIN SODIUM-DEXTROSE 2-4 GM/100ML-% IV SOLN
2.0000 g | Freq: Once | INTRAVENOUS | Status: AC
  Administered 2022-11-01: 2 g via INTRAVENOUS

## 2022-11-01 MED ORDER — DEXTROSE 5 % IV SOLN: INTRAVENOUS | Status: AC

## 2022-11-01 MED ORDER — ROCURONIUM BROMIDE 10 MG/ML (PF) SYRINGE
PREFILLED_SYRINGE | INTRAVENOUS | Status: DC | PRN
  Administered 2022-11-01: 60 mg via INTRAVENOUS
  Administered 2022-11-01: 40 mg via INTRAVENOUS

## 2022-11-01 MED ORDER — MIDAZOLAM HCL 2 MG/2ML IJ SOLN: 2.0000 mg | INTRAMUSCULAR | Status: AC | PRN

## 2022-11-01 MED ORDER — 0.9 % SODIUM CHLORIDE (POUR BTL) OPTIME
TOPICAL | Status: DC | PRN
  Administered 2022-11-01: 1000 mL

## 2022-11-01 MED ORDER — GABAPENTIN 250 MG/5ML PO SOLN
100.0000 mg | Freq: Three times a day (TID) | ORAL | Status: DC
  Administered 2022-11-01 – 2023-01-22 (×245): 100 mg
  Filled 2022-11-01 (×266): qty 2

## 2022-11-01 MED ORDER — MIDAZOLAM HCL 5 MG/5ML IJ SOLN
INTRAMUSCULAR | Status: DC | PRN
  Administered 2022-11-01 (×2): 1 mg via INTRAVENOUS

## 2022-11-01 MED ORDER — ONDANSETRON HCL 4 MG/2ML IJ SOLN
INTRAMUSCULAR | Status: AC
  Filled 2022-11-01: qty 2

## 2022-11-01 MED ORDER — BUPIVACAINE-EPINEPHRINE (PF) 0.5% -1:200000 IJ SOLN
INTRAMUSCULAR | Status: DC | PRN
  Administered 2022-11-01: 30 mL

## 2022-11-01 MED ORDER — CEFAZOLIN SODIUM 1 G IJ SOLR
INTRAMUSCULAR | Status: AC
  Filled 2022-11-01: qty 20

## 2022-11-01 MED ORDER — MIDAZOLAM HCL 2 MG/2ML IJ SOLN
INTRAMUSCULAR | Status: AC
  Filled 2022-11-01: qty 2

## 2022-11-01 SURGICAL SUPPLY — 42 items
APL PRP STRL LF DISP 70% ISPRP (MISCELLANEOUS) ×2
BAG COUNTER SPONGE SURGICOUNT (BAG) ×3 IMPLANT
BAG SPNG CNTER NS LX DISP (BAG) ×2
CANISTER SUCT 3000ML PPV (MISCELLANEOUS) ×3 IMPLANT
CHLORAPREP W/TINT 26 (MISCELLANEOUS) ×3 IMPLANT
COVER SURGICAL LIGHT HANDLE (MISCELLANEOUS) ×3 IMPLANT
DRAPE LAPAROSCOPIC ABDOMINAL (DRAPES) ×3 IMPLANT
DRAPE UTILITY XL STRL (DRAPES) ×3 IMPLANT
ELECT CAUTERY BLADE 6.4 (BLADE) ×3 IMPLANT
ELECT REM PT RETURN 9FT ADLT (ELECTROSURGICAL) ×2 IMPLANT
ELECTRODE REM PT RTRN 9FT ADLT (ELECTROSURGICAL) ×3 IMPLANT
GAUZE 4X4 16PLY ~~LOC~~+RFID DBL (SPONGE) ×3 IMPLANT
GAUZE SPONGE 4X4 12PLY STRL (GAUZE/BANDAGES/DRESSINGS) ×3 IMPLANT
GLOVE BIO SURGEON STRL SZ8 (GLOVE) ×6 IMPLANT
GLOVE BIOGEL PI IND STRL 8 (GLOVE) ×3 IMPLANT
GOWN STRL REUS W/ TWL LRG LVL3 (GOWN DISPOSABLE) ×3 IMPLANT
GOWN STRL REUS W/ TWL XL LVL3 (GOWN DISPOSABLE) ×3 IMPLANT
GOWN STRL REUS W/TWL LRG LVL3 (GOWN DISPOSABLE) ×2
GOWN STRL REUS W/TWL XL LVL3 (GOWN DISPOSABLE) ×2
INTRODUCER TRACH BLUE RHINO 6F (TUBING) IMPLANT
KIT BASIN OR (CUSTOM PROCEDURE TRAY) ×3 IMPLANT
KIT TURNOVER KIT B (KITS) ×3 IMPLANT
NS IRRIG 1000ML POUR BTL (IV SOLUTION) ×3 IMPLANT
PACK EENT II TURBAN DRAPE (CUSTOM PROCEDURE TRAY) ×3 IMPLANT
PACK GENERAL/GYN (CUSTOM PROCEDURE TRAY) ×3 IMPLANT
PAD ARMBOARD 7.5X6 YLW CONV (MISCELLANEOUS) ×6 IMPLANT
PEG SAFETY KIT PUSH 24FR (Peg) IMPLANT
PENCIL BUTTON HOLSTER BLD 10FT (ELECTRODE) ×3 IMPLANT
PENCIL SMOKE EVACUATOR (MISCELLANEOUS) ×3 IMPLANT
PLUG CATH AND CAP STER (CATHETERS) IMPLANT
SPONGE INTESTINAL PEANUT (DISPOSABLE) ×3 IMPLANT
SUT PDS AB 1 CT  36 (SUTURE) ×2
SUT PDS AB 1 CT 36 (SUTURE) ×3 IMPLANT
SUT SILK 2 0 SH (SUTURE) ×6 IMPLANT
SUT SILK 2 0 SH CR/8 (SUTURE) ×3 IMPLANT
SUT VICRYL AB 3 0 TIES (SUTURE) ×3 IMPLANT
SYR 20ML LL LF (SYRINGE) ×3 IMPLANT
SYRINGE TOOMEY DISP (SYRINGE) ×3 IMPLANT
TOWEL GREEN STERILE (TOWEL DISPOSABLE) ×3 IMPLANT
TOWEL GREEN STERILE FF (TOWEL DISPOSABLE) ×3 IMPLANT
TUBE CONNECTING 12X1/4 (SUCTIONS) ×3 IMPLANT
TUBE PEG 24FR STANDARD DOME (TUBING) IMPLANT

## 2022-11-01 NOTE — Transfer of Care (Signed)
Immediate Anesthesia Transfer of Care Note  Patient: Damon [name]  Procedure(s) Performed: TRACHEOSTOMY (Neck) INSERTION OF GASTROSTOMY TUBE (Abdomen)  Patient Location: NICU  Anesthesia Type:General  Level of Consciousness: unresponsive and Patient remains intubated per anesthesia plan  Airway & Oxygen Therapy: Patient remains intubated per anesthesia plan and Patient placed on Ventilator (see vital sign flow sheet for setting)  Post-op Assessment: Report given to RN and Post -op Vital signs reviewed and stable  Post vital signs: Reviewed and stable  Last Vitals:  Vitals Value Taken Time  BP    Temp    Pulse 93 11/01/22 1342  Resp 22 11/01/22 1344  SpO2 91 % 11/01/22 1342  Vitals shown include unvalidated device data.  Last Pain:  Vitals:   11/01/22 1200  TempSrc: Oral  PainSc:          Complications: No notable events documented.

## 2022-11-01 NOTE — Progress Notes (Signed)
[place] Progress Note Patient Name: Icker Swigert DOB: 01-Aug-1961 MRN: [id number]   Date of Service  11/01/2022  HPI/Events of Note  Patient with breakthrough seizures that are extinguished by PRN iv Versed,  eICU Interventions  Will change Versed order to Q 15 minutes  PRN seizures.        Kerry Kass Breeze Berringer 11/01/2022, 8:01 PM

## 2022-11-01 NOTE — Progress Notes (Signed)
   Progress Note   Date: 10/31/2022  Patient Name: Damon [name]        MRN#: [id number]   Clarification of the diagnosis of pressure ulcer(s):   pressure injury stage unstageable was NOT present on admission

## 2022-11-01 NOTE — Progress Notes (Addendum)
Pt had seizure lasting 2 minutes. Seizure involved mouth twitching , increased resp rate in the high 30s , arms shaking and sweating. Message sent to [place]. Will continue to monitor .   - Pt had another seizure lasting 5 mins with the same symptoms. Orders received from Dr Apolonio Schneiders .

## 2022-11-01 NOTE — Progress Notes (Signed)
Daily Progress Note   Patient Name: Damon [name]       Date: 11/01/2022 DOB: Nov 07, 1960  Age: 62 y.o. MRN#: [id number] Attending Physician: Laurin Coder, MD Primary Care Physician: [facility] Medical Admit Date: 10/03/2022  Reason for Consultation/Follow-up: Establishing goals of care  Patient Profile/HPI:  62 y/o male admitted to [facility] on 12/20 in setting of acute hypoxemic respiratory failure due to Flu A. Placed on BIPAP. Had a 5 minute PEA arrest, required intubation. After several days on mechanical ventilation remains comatose, MRI brain findings worrisome for anoxic brain injury. Moved to [facility] for LTM EEG monitoring.  Palliative care has been asked to get involved for further goals of care conversations in the setting of anoxic brain injury post arrest. 12/31- no improvements, prognosis has been discussed extensively with family by palliative team, neurology and PCCM providers. 1/2- f/u meeting with patient's spouse Damon [name]- she requested time and space as she grapples with processing eventual loss of spouse and how her life will be without him, she is not yet in a place to consider transition to comfort 1/7- propofol dc'd 1/5, no further myoclonus, no other sedating meds, discussed with spouse that decisions are needing to be made re: trach/PEG/LTC vs comfort 1/8 No significant changes in neuro status. Tachycardic to 130s, hypertensive to 140s. Coreg transitioned to metoprolol.   1/11-having some recurrent myoclonus ?neurostorming 1/15-wife requesting trach/PEG  Subjective: Chart reviewed including labs, progress notes, imaging from this and previous encounters.  No family at bedside. Plan for trach/PEG today.  Called later this afternoon and spoke to Damon [name] by phone. She is  not planning on visiting today- she will be back tomorrow. She had no questions or concerns. She had been updated by team that all was well with patient's surgery.     Review of Systems  Unable to perform ROS: Intubated   Physical Exam Vitals and nursing note reviewed.  Constitutional:      Comments: Frail, cachectic  Cardiovascular:     Rate and Rhythm: Normal rate.             Vital Signs: BP (!) 132/105   Pulse (!) 103   Temp 98.6 F (37 C) (Axillary)   Resp (!) 24   Ht 5\' 4"  (1.626 m)   Wt 52 kg   SpO2 100%  BMI 19.68 kg/m  SpO2: SpO2: 100 % O2 Device: O2 Device: Ventilator O2 Flow Rate:    Intake/output summary:  Intake/Output Summary (Last 24 hours) at 11/01/2022 1204 Last data filed at 11/01/2022 1000 Gross per 24 hour  Intake 3195.7 ml  Output 1980 ml  Net 1215.7 ml    LBM: Last BM Date : 11/01/22 Baseline Weight: Weight: 64.9 kg Most recent weight: Weight: 52 kg       Palliative Assessment/Data: PPS: 10%      Patient Active Problem List   Diagnosis Date Noted   Advanced care planning/counseling discussion 10/21/2022   On mechanically assisted ventilation ([place]) 10/17/2022   Anoxic brain damage ([place]) 10/17/2022   Anoxic encephalopathy ([place]) 10/16/2022   Seizure ([place]) 10/16/2022   Pressure injury of skin 10/16/2022   Acute hypoxic respiratory failure ([place]) 10/03/2022   CAP (community acquired pneumonia) 10/03/2022   Influenza and pneumonia 10/03/2022   Elevated LFTs 10/03/2022   HTN (hypertension) 10/03/2022   HLD (hyperlipidemia) 10/03/2022   Tobacco abuse 10/03/2022   SIRS (systemic inflammatory response syndrome) ([place]) 10/03/2022   Influenza A 10/03/2022   Cardiac arrest ([place]) 10/03/2022   Tobacco dependence 10/03/2022    Palliative Care Assessment & Plan    Assessment/Recommendations/Plan  Continue current plan Trach/PEG today PMT will see again on Monday   Code Status: DNR  Prognosis:  Unable to determine  Discharge  Planning: To Be Determined  Care plan was discussed with spouse and care team.   Thank you for allowing the Palliative Medicine Team to assist in the care of this patient.   Greater than 50%  of this time was spent counseling and coordinating care related to the above assessment and plan.  [name], AGNP-C Palliative Medicine   Please contact Palliative Medicine Team phone at [phone] for questions and concerns.

## 2022-11-01 NOTE — Progress Notes (Signed)
Patient ID: Damon [name], male   DOB: January 31, 1961, 62 y.o.   MRN: [id number]      Subjective: On vent ROS negative except as listed above. Objective: Vital signs in last 24 hours: Temp:  [98.5 F (36.9 C)-99.8 F (37.7 C)] 99.8 F (37.7 C) (01/18 0800) Pulse Rate:  [87-104] 92 (01/18 0700) Resp:  [14-31] 23 (01/18 0700) BP: (99-133)/(61-106) 131/106 (01/18 0700) SpO2:  [90 %-100 %] 100 % (01/18 0820) FiO2 (%):  [30 %] 30 % (01/18 0820) Weight:  [52 kg] 52 kg (01/18 0130) Last BM Date : 11/01/22  Intake/Output from previous day: 01/17 0701 - 01/18 0700 In: 3495.7 [I.V.:1948; NG/GT:1547.8] Out: 2355 [Urine:1455; Stool:900] Intake/Output this shift: Total I/O In: -  Out: 25 [Urine:25]  General appearance: no distress GI: soft, NT Neuro: eyes open, not F/C  Lab Results: CBC  Recent Labs    10/31/22 0507 11/01/22 0442  WBC 18.4* 15.0*  HGB 11.6* 9.7*  HCT 36.9* 31.0*  PLT 219 212   BMET Recent Labs    10/31/22 0507 11/01/22 0442  NA 153* 149*  K 4.6 3.9  CL 117* 113*  CO2 25 27  GLUCOSE 117* 108*  BUN 71* 50*  CREATININE 0.87 0.66  CALCIUM 9.1 9.0   PT/INR No results for input(s): "LABPROT", "INR" in the last 72 hours. ABG No results for input(s): "PHART", "HCO3" in the last 72 hours.  Invalid input(s): "PCO2", "PO2"  Studies/Results: No results found.  Anti-infectives: Anti-infectives (From admission, onward)    Start     Dose/Rate Route Frequency Ordered Stop   11/01/22 1215  ceFAZolin (ANCEF) IVPB 2g/100 mL premix        2 g 200 mL/hr over 30 Minutes Intravenous  Once 11/01/22 0851     10/14/22 1000  piperacillin-tazobactam (ZOSYN) IVPB 3.375 g        3.375 g 12.5 mL/hr over 240 Minutes Intravenous Every 8 hours 10/14/22 0951 10/21/22 0556   10/04/22 2200  cefTRIAXone (ROCEPHIN) 2 g in sodium chloride 0.9 % 100 mL IVPB  Status:  Discontinued        2 g 200 mL/hr over 30 Minutes Intravenous Daily at bedtime 10/04/22 0914 10/10/22 0900    10/04/22 1045  oseltamivir (TAMIFLU) 6 MG/ML suspension 75 mg        75 mg Per Tube 2 times daily 10/04/22 0950 10/08/22 2229   10/04/22 0600  cefTRIAXone (ROCEPHIN) 2 g in sodium chloride 0.9 % 100 mL IVPB  Status:  Discontinued        2 g 200 mL/hr over 30 Minutes Intravenous Every 24 hours 10/03/22 1054 10/03/22 1250   10/04/22 0600  azithromycin (ZITHROMAX) 500 mg in sodium chloride 0.9 % 250 mL IVPB  Status:  Discontinued        500 mg 250 mL/hr over 60 Minutes Intravenous Every 24 hours 10/03/22 1054 10/03/22 1250   10/03/22 2200  cefTRIAXone (ROCEPHIN) 1 g in sodium chloride 0.9 % 100 mL IVPB  Status:  Discontinued        1 g 200 mL/hr over 30 Minutes Intravenous Daily at bedtime 10/03/22 1559 10/04/22 0914   10/03/22 2200  metroNIDAZOLE (FLAGYL) IVPB 500 mg  Status:  Discontinued        500 mg 100 mL/hr over 60 Minutes Intravenous Every 12 hours 10/03/22 1559 10/05/22 0812   10/03/22 1400  piperacillin-tazobactam (ZOSYN) IVPB 3.375 g  Status:  Discontinued        3.375 g 12.5 mL/hr over  240 Minutes Intravenous Every 8 hours 10/03/22 1258 10/03/22 1559   10/03/22 0515  cefTRIAXone (ROCEPHIN) 1 g in sodium chloride 0.9 % 100 mL IVPB        1 g 200 mL/hr over 30 Minutes Intravenous  Once 10/03/22 0503 10/03/22 0732   10/03/22 0515  azithromycin (ZITHROMAX) 500 mg in sodium chloride 0.9 % 250 mL IVPB        500 mg 250 mL/hr over 60 Minutes Intravenous  Once 10/03/22 0503 10/03/22 0732       Assessment/Plan: Anoxic brain injury - for trach and PEG in OR today. I called his wife and discussed the procedures, risks, and benefits. I answered her questions. She agrees.   LOS: 49 days    Georganna Skeans, MD, MPH, FACS Trauma & General Surgery Use [email or web address] to contact on call provider  11/01/2022

## 2022-11-01 NOTE — Progress Notes (Signed)
NAME:  Damon [name], MRN:  [id number], DOB:  03-Jan-1961, LOS: 15 ADMISSION DATE:  10/03/2022, CONSULTATION DATE:  10/03/2022 REFERRING MD:  Marylyn Ishihara - [facility] CHIEF COMPLAINT:  Dyspnea   History of Present Illness:  62 year old man admitted to [facility] 12/20 in the setting of acute hypoxemic respiratory failure due to Flu A. PMHx significant for HTN, HLD, chronic back pain, tobacco abuse   Placed on BIPAP.  Coded (likely in the setting of hypoxia), initial rhythm PEA with CPR x 5 minutes, required intubation.  After several days on mechanical ventilation remains comatose, MRI Brain 12/26 worrisome for anoxic brain injury.  Transferred to [place] for LTM EEG monitoring.  Neurology consulted. LTM with generalized epileptogenicity. Treated with Propofol, Depakote, Keppra, Ketamine. 12/31 Neurology with goals of care with family. Relayed that patient has a grim neurological recovery due to irreversible anoxic injury. Family wished to continue aggressive measures.   Palliative care consulted. Stay complicated with neuro-storming.   Surgical consult regarding PEG/trach  Pertinent Medical History:   Past Medical History:  Diagnosis Date   Elevated lipids    Hypertension    Tobacco abuse    Significant Hospital Events: Including procedures, antibiotic start and stop dates in addition to other pertinent events   12/18 Flu A positive 12/20 Admitted, 5 minute code after non-compliance with BiPAP MRSA PCR - neg Urine strep _POS 12/21 Intubated, paralyzed, ARDS protocol, on 7cc/kg 12/22 Weaned to 6 cc/kg with dyssynchrony, back on 7 cc/kg, driving pressures good, weaning vent 12/23 Attempt to wean sedation again with dyssynchrony and desaturation.  Some concern for cuff leak, tube exchange.  Cuff did not appear to be blown.  Ongoing signs of cuff leak, query leaking around cough, possible large trachea, diuresing 12/25 tolerating PSV, myoclonus> ceribell, Neuro consult, changed to propofol, diuresed  12/26  Changed to DNR. On Propofol, versed.  Tolerating PSV.  MRI brain with hypoxic/anoxic injury Blood culture neg 12/27 GPD's on EEG, pending transfer to [facility] for cEEG NEURO CONSULT 12/28 moved to [facility] for LTM EEG 12/31 family updated by neuro: no chance for meaningful recovery, they should make plans for comfort measures 1/1 cultures - resp normal flora 1/2 eeg still with epileptogenicity. Palli fam mtg -- family does not want to hear from medical teams unless there is "good news"  1/3  cEEG with burst suppression w highly epileptiform bursts.  1/4 cEEG with burst suppression  + highly epileptiform bursts still.  WBC slightly up to 22.5. Temp high 99 low 100  c-EEG stopped duie to lack of benefit Pallative care consult - wife upset about early dc from ER on 12/18 Chaplain consult: Wife is sufering from anticipatory grief + accepting god's way + curious/anger about events leading upto current state EEG: generalized epileptogenicity with high potential for seizures as well as profound diffuse encephalopathy. In the setting of cardiac arrest, this EEG pattern is suggestive of anoxic/hypoxic brain injury.  1/5 - Low grade fever +. [place] 22.5K.  on Zosyn. On vent fio2 30%. TF +. Per RN - diprivan stopped yesterday. No myoclonus today. Mild tachycardia + Rpt pall care on 10/21/22 per wife 1/6 - On vent FiO2 30%.  Tmax 99,21F ., WBC better. Still off diprivan -> No myoclonus. cXR visualized - devices in place and faiure clear Oral oxy and klonpin stopped 1/8 No significant changes in neuro status. Tachycardic to 130s, hypertensive to 140s. Coreg transitioned to metoprolol. 1/11 wife considering trach/peg, to discuss with surgery-discussed with Dr. Bobbye Morton 1/12 more tachycardic 1/16-spoke  with wife at length at bedside, Dr. Hulen Skains also had a long conversation with spouse-desires to continue current aggressive care and desires PEG and trach placed  Interim History / Subjective:  No overnight change in  status Scheduled for PEG and trach today  Objective:  Blood pressure (!) 131/106, pulse 92, temperature 98.6 F (37 C), temperature source Axillary, resp. rate (!) 23, height 5\' 4"  (1.626 m), weight 52 kg, SpO2 100 %.    Vent Mode: PRVC FiO2 (%):  [30 %] 30 % Set Rate:  [15 bmp] 15 bmp Vt Set:  [550 mL] 550 mL PEEP:  [5 cmH20] 5 cmH20 Plateau Pressure:  [14 cmH20-15 cmH20] 14 cmH20   Intake/Output Summary (Last 24 hours) at 11/01/2022 2585 Last data filed at 11/01/2022 0500 Gross per 24 hour  Intake 3495.7 ml  Output 2355 ml  Net 1140.7 ml   Filed Weights   10/30/22 0300 10/31/22 0500 11/01/22 0130  Weight: 49.1 kg 50.6 kg 52 kg    General: Critically ill-appearing, unresponsive,  HEENT: Endotracheal tube in place, orogastric tube in place Neuro: Not following commands, breathing over the vent CV: S1-S2 appreciated, tachycardic PULM: Coarse breath sounds bilaterally GI: Bowel sounds appreciated Extremities: Skin is warm and dry Skin: No rashes   Resolved   Flu A CAP, pneumococcal pneumonia  S/p Zosyn 12/31 - 1/7; Ceftriaxone 12/20- 12/27, Azithro 12/20, Flagyl 12/20 - 12/22  Assessment & Plan:   Respiratory failure Anoxic brain injury, ventilator dependent Acute respiratory failure with hypoxia/ARDS secondary to flu and strep pneumonia did complete course of antibiotics and Tamiflu -PEG and trach today -Extubation precluded by patient's mental status -Continue full mechanical ventilator support -Continue bronchial hygiene  Paroxysmal sympathetic hyperactivity -On propranolol 80 every 8 -Added Neurontin every 8  Hypernatremia -Continue to monitor and correct -On free water  Leukocytosis -Improving -No fever -Completed 7 days of Zosyn -Blood cultures 1/13 negative -Tracheal aspirate 1/13 negative  Acute kidney injury with rising BUN resolving -Continue free water   Anemia -Transfuse per protocol  Tachycardia/hypotension due to paroxysmal  sympathetic hyperactivity -Continue propranolol -Continue cardiac monitoring  Severe protein calorie malnutrition -Continue enteric feeds  SSI for hyperglycemia  Tracheostomy/PEG tentatively scheduled for 11/01/2022 about midday  GOC documentation previously DNR status Care Giver Distress + (anticiptory grief + upset at outcome +) Remains comatose. Patient's wife has been understandably in great distress over patient's clinical status. Patient's grandson who has autism is struggling greatly with his grandfather's absence. Appreciate Palliative team's input and assistance with management/GOC conversations. - DNR code status at present - Ongoing discussion re: patient and family's wishes in terms of tracheostomy, PEG, LTACH. Wife spoke with surgery 1/12.  - Spoke with wife 1/13. She expressed her concerns with the entire stay starting at [facility]. >> See note from 1/13 for further information. But to summarize at this time wishes to proceed with trach/peg, no ready to "let him go"  1/16-had extensive conversation with spouse, Dr. Sherlynn Stalls also did have an extensive conversation with spouse-he remains DNR, continue full scope of care  Best Practice (right click and "Reselect all SmartList Selections" daily)   Diet/type: tubefeeds DVT prophylaxis: LMWH GI prophylaxis: PPI Foley:  External Foley in place  Code Status:  DNR Last date of multidisciplinary goals of care discussion: ongoing  The patient is critically ill with multiple organ systems failure and requires high complexity decision making for assessment and support, frequent evaluation and titration of therapies, application of advanced monitoring technologies and extensive interpretation  of multiple databases. Critical Care Time devoted to patient care services described in this note independent of APP/resident time (if applicable)  is 32 minutes.   Virl Diamond MD [facility] Personal pager: See Amion If  unanswered, please page CCM On-call: #[phone]

## 2022-11-01 NOTE — Anesthesia Preprocedure Evaluation (Addendum)
Anesthesia Evaluation  Patient identified by MRN, date of birth, ID band Patient unresponsive    Reviewed: Allergy & Precautions, NPO status , Patient's Chart, lab work & pertinent test results  History of Anesthesia Complications Negative for: history of anesthetic complications  Airway Mallampati: Intubated       Dental  (+) Poor Dentition   Pulmonary pneumonia, Current Smoker   + rhonchi        Cardiovascular hypertension, Pt. on medications  Rhythm:Regular Rate:Tachycardia     Neuro/Psych Seizures -,  Anoxic Brain injury    GI/Hepatic negative GI ROS, Neg liver ROS,,,  Endo/Other  negative endocrine ROS    Renal/GU negative Renal ROS     Musculoskeletal negative musculoskeletal ROS (+)    Abdominal   Peds  Hematology  (+) Blood dyscrasia, anemia   Anesthesia Other Findings   Reproductive/Obstetrics                             Anesthesia Physical Anesthesia Plan  ASA: 4  Anesthesia Plan: General   Post-op Pain Management: Minimal or no pain anticipated   Induction: Intravenous  PONV Risk Score and Plan: 1 and Ondansetron  Airway Management Planned: Oral ETT and Tracheostomy  Additional Equipment:   Intra-op Plan:   Post-operative Plan: Post-operative intubation/ventilation  Informed Consent: I have reviewed the patients History and Physical, chart, labs and discussed the procedure including the risks, benefits and alternatives for the proposed anesthesia with the patient or authorized representative who has indicated his/her understanding and acceptance.   Patient has DNR.  Suspend DNR.   Consent reviewed with POA  Plan Discussed with: Anesthesiologist, CRNA and Surgeon  Anesthesia Plan Comments: (Talked with wife via phone.)       Anesthesia Quick Evaluation

## 2022-11-01 NOTE — Progress Notes (Signed)
eLink Physician-Brief Progress Note Patient Name: Damon [name] DOB: [birth year] MRN: [id number]   Date of Service  11/01/2022  HPI/Events of Note  Patient with frequent frankly watery stools secondary to lactulose, a rectal pouch has failed to contain the stools. No contraindication to [facility].  eICU Interventions  Flexiseal ordered.        [name] 11/01/2022, 3:11 AM

## 2022-11-01 NOTE — Anesthesia Postprocedure Evaluation (Signed)
Anesthesia Post Note  Patient: Damon [name]  Procedure(s) Performed: TRACHEOSTOMY (Neck) INSERTION OF GASTROSTOMY TUBE (Abdomen)     Patient location during evaluation: SICU Anesthesia Type: General Level of consciousness: sedated Pain management: pain level controlled Vital Signs Assessment: post-procedure vital signs reviewed and stable Respiratory status: patient remains intubated per anesthesia plan Cardiovascular status: stable Postop Assessment: no apparent nausea or vomiting Anesthetic complications: no   No notable events documented.  Last Vitals:  Vitals:   11/01/22 1200 11/01/22 1340  BP: (!) 130/93   Pulse: 100   Resp: (!) 22   Temp: 37.3 C   SpO2: 99% 93%    Last Pain:  Vitals:   11/01/22 1200  TempSrc: Oral  PainSc:                  [place]

## 2022-11-01 NOTE — Progress Notes (Signed)
Noted sat 90% s/p coming back from OR.  Fio2 increased to 40%, sat 96-97%, RN aware.

## 2022-11-01 NOTE — Op Note (Signed)
11/01/2022  1:09 PM  PATIENT:  Damon [name]  62 y.o. male  PRE-OPERATIVE DIAGNOSIS: Respiratory failure and need for enteral access  POST-OPERATIVE DIAGNOSIS: Respiratory failure and need for enteral access  PROCEDURE:  Procedure(s): Tracheostomy #6 Shiley Percutaneous endoscopic gastrostomy tube placement  SURGEON:  Surgeon(s): Georganna Skeans, MD  ASSISTANTS: Alferd Apa, PA-C  ANESTHESIA:   local and general  EBL:  Total I/O In: -  Out: 125 [Urine:125]  BLOOD ADMINISTERED:none  DRAINS: none   SPECIMEN:  No Specimen  DISPOSITION OF SPECIMEN:  N/A  COUNTS:  YES  DICTATION: .Dragon Dictation Procedure in detail: Informed consent was obtained.  He received intravenous antibiotics.  He was brought directly to the operating room from the ICU on the ventilator.  General anesthesia was administered by the anesthesia staff.  He was positioned appropriately and his anterior neck was prepped and draped in sterile fashion.  We did a timeout procedure.  Local was injected 2 cm cephalad to the sternal notch.  A vertical incision was made.  The incision was carried down through the platysma revealing the strap muscles.  These were split along the midline and the trachea was exposed posed after dividing some thin thyroid tissue.  There was a small vein on the thyroid which was cauterized to get excellent hemostasis.  At this time, the trachea was palpated and the endotracheal tube was withdrawn.  Angiocath was inserted between the second and third tracheal ring followed by the guidewire.  Small blue dilator was placed followed by the large Blue Rhino dilator.  Next, a #6 Shiley tracheostomy tube was inserted over a 24 [place] dilator.  It passed easily into the trachea.  The inner cannula was applied and it was hooked up to the ventilator circuit.  The balloon was inflated.  Excellent volume returns were obtained.  Saturations remained 100%.  The tracheostomy tube was secured to the skin  with 2-0 Prolene's.  A Velcro trach tie was applied.  This completed this portion of the procedure.  All instrument counts were correct.  Attention was directed to the PEG placement.  His anterior abdomen was prepped and draped in sterile fashion.  The EGD scope was inserted via the mouth down into the esophagus.  There were no gross lesions.  The scope was inserted down into the stomach.  The stomach was insufflated.  I then passed the scope into the first part of the duodenum.  There were no ulcers or masses.  The scope was withdrawn into the stomach and it was further insufflated.  We achieved an excellent poke site under visualization.  The skin was anesthetized there and a small incision was made.  The Angiocath was then inserted under direct vision and a guidewire was passed into the stomach.  This was grasped with the endoscopic snare and brought out through the mouth.  The PEG tube was affixed to the wire and the wire was pulled out through the abdominal wall followed by the PEG tube.  The scope was reinserted into the stomach visualizing excellent placement of the tube.  The flange was placed to appropriate position and the exit number on the PEG was 3.  The stomach was evacuated.  Antibiotic ointment and a dressing were placed at the PEG site.  There were no apparent complications.  This completed the procedure.  Again all counts were correct.  He tolerated the procedure without apparent complication and was taken directly back to the intensive care unit on the ventilator in critical  condition. PATIENT DISPOSITION:  ICU - intubated and critically ill.   Delay start of Pharmacological VTE agent (>24hrs) due to surgical blood loss or risk of bleeding:  no  Georganna Skeans, MD, MPH, FACS Pager: [phone]  1/18/20241:09 PM

## 2022-11-02 ENCOUNTER — Encounter (HOSPITAL_COMMUNITY): Payer: Self-pay | Admitting: General Surgery

## 2022-11-02 DIAGNOSIS — J9601 Acute respiratory failure with hypoxia: Secondary | ICD-10-CM | POA: Diagnosis not present

## 2022-11-02 LAB — CBC WITH DIFFERENTIAL/PLATELET
Abs Immature Granulocytes: 0.26 10*3/uL — ABNORMAL HIGH (ref 0.00–0.07)
Basophils Absolute: 0.1 10*3/uL (ref 0.0–0.1)
Basophils Relative: 1 %
Eosinophils Absolute: 0.4 10*3/uL (ref 0.0–0.5)
Eosinophils Relative: 2 %
HCT: 31.4 % — ABNORMAL LOW (ref 39.0–52.0)
Hemoglobin: 9.9 g/dL — ABNORMAL LOW (ref 13.0–17.0)
Immature Granulocytes: 2 %
Lymphocytes Relative: 10 %
Lymphs Abs: 1.8 10*3/uL (ref 0.7–4.0)
MCH: 28.7 pg (ref 26.0–34.0)
MCHC: 31.5 g/dL (ref 30.0–36.0)
MCV: 91 fL (ref 80.0–100.0)
Monocytes Absolute: 1.5 10*3/uL — ABNORMAL HIGH (ref 0.1–1.0)
Monocytes Relative: 9 %
Neutro Abs: 13.1 10*3/uL — ABNORMAL HIGH (ref 1.7–7.7)
Neutrophils Relative %: 76 %
Platelets: 206 10*3/uL (ref 150–400)
RBC: 3.45 MIL/uL — ABNORMAL LOW (ref 4.22–5.81)
RDW: 16.4 % — ABNORMAL HIGH (ref 11.5–15.5)
WBC: 17.1 10*3/uL — ABNORMAL HIGH (ref 4.0–10.5)
nRBC: 0 % (ref 0.0–0.2)

## 2022-11-02 LAB — GLUCOSE, CAPILLARY
Glucose-Capillary: 138 mg/dL — ABNORMAL HIGH (ref 70–99)
Glucose-Capillary: 146 mg/dL — ABNORMAL HIGH (ref 70–99)
Glucose-Capillary: 135 mg/dL — ABNORMAL HIGH (ref 70–99)
Glucose-Capillary: 144 mg/dL — ABNORMAL HIGH (ref 70–99)
Glucose-Capillary: 127 mg/dL — ABNORMAL HIGH (ref 70–99)
Glucose-Capillary: 110 mg/dL — ABNORMAL HIGH (ref 70–99)

## 2022-11-02 LAB — BASIC METABOLIC PANEL
Anion gap: 8 (ref 5–15)
BUN: 42 mg/dL — ABNORMAL HIGH (ref 6–20)
CO2: 26 mmol/L (ref 22–32)
Calcium: 8.6 mg/dL — ABNORMAL LOW (ref 8.9–10.3)
Chloride: 113 mmol/L — ABNORMAL HIGH (ref 98–111)
Creatinine, Ser: 0.7 mg/dL (ref 0.61–1.24)
GFR, Estimated: >60 mL/min (ref >60)
Glucose, Bld: 123 mg/dL — ABNORMAL HIGH (ref 70–99)
Potassium: 4.3 mmol/L (ref 3.5–5.1)
Sodium: 147 mmol/L — ABNORMAL HIGH (ref 135–145)

## 2022-11-02 MED ORDER — JUVEN PO PACK
1.0000 | PACK | Freq: Two times a day (BID) | ORAL | Status: DC
  Administered 2022-11-02 – 2023-01-07 (×133): 1
  Filled 2022-11-02 (×133): qty 1

## 2022-11-02 NOTE — Progress Notes (Signed)
Patient ID: Damon [name], male   DOB: 06/23/1961, 62 y.o.   MRN: [id number] S/P trach/PEG 1/18. Trach in place ABD soft, PEG in place Continue abdominal binder loosely to protect PEG. Trauma is available PRN.  Georganna Skeans, MD, MPH, FACS Please use [email or web address] to contact on call provider

## 2022-11-02 NOTE — Plan of Care (Signed)
Patient received on [facility] settings as charted. First attempt to wean failed due to RR in the 50's. Second attempt of PSV 8/5 approx. 1130, tolerated well. Patient remains on this setting at this time. Trach care done without issue. Sutures in place.

## 2022-11-02 NOTE — Progress Notes (Signed)
Nutrition Follow-up  DOCUMENTATION CODES:   Not applicable  INTERVENTION:   Tube feeding via PEG tube:  Osmolite 1.5 @ 65 ml/h (1560 ml per day) Prosource TF20 60 ml BID  Provides 2500 kcal, 137 gm protein, 1185 ml free water daily  300 ml free water every 4 hours  Total free water: 2188 ml   Add Juven BID   NUTRITION DIAGNOSIS:   Inadequate oral intake related to acute illness (respiratory failure/ventilated) as evidenced by NPO status. Ongoing.   GOAL:   Patient will meet greater than or equal to 90% of their needs Met with TF at goal.   MONITOR:   Vent status, Labs, Weight trends, TF tolerance  REASON FOR ASSESSMENT:   Consult Enteral/tube feeding initiation and management  ASSESSMENT:   Pt admitted from home with the flu leading to acute respiratory failure with hypoxia and PEA arrest upon admission. PMH significant for HTN, HLD, chronic back pain, tobacco use.  Pt discussed during ICU rounds and with RN.  Lactulose now d/c'ed Seizure activity overnight now resolved  01/18 - s/p trach/PEG placement  Weight decreasing Current weight: 51.1 kg Admission weight: 64.9 kg  Medications reviewed and include: pepcid, banatrol TF   Labs reviewed: Na 147 PO4: 5.8->4.8 CBG's: 114-146    Diet Order:   Diet Order             Diet NPO time specified  Diet effective ____                   EDUCATION NEEDS:   No education needs have been identified at this time  Skin:  Skin Assessment: Skin Integrity Issues: Skin Integrity Issues:: Stage II Stage II: sacrum, R upper ear  Last BM:  900 ml via FMS  Height:   Ht Readings from Last 1 Encounters:  10/11/22 5\' 4"  (1.626 m)    Weight:   Wt Readings from Last 1 Encounters:  11/02/22 51.1 kg   BMI:  Body mass index is 19.34 kg/m.  Estimated Nutritional Needs:   Kcal:  1900-2100  Protein:  95-110g  Fluid:  >/=1.9L  Chiana Wamser P., RD, LDN, CNSC See AMiON for contact information

## 2022-11-02 NOTE — Progress Notes (Signed)
NAME:  Damon [name], MRN:  [id number], DOB:  1961-09-16, LOS: 30 ADMISSION DATE:  10/03/2022, CONSULTATION DATE:  10/03/2022 REFERRING MD:  Ronaldo Miyamoto - TRH CHIEF COMPLAINT:  Dyspnea   History of Present Illness:  62 year old man admitted to [facility] 12/20 in the setting of acute hypoxemic respiratory failure due to Flu A. PMHx significant for HTN, HLD, chronic back pain, tobacco abuse   Placed on BIPAP.  Coded (likely in the setting of hypoxia), initial rhythm PEA with CPR x 5 minutes, required intubation.  After several days on mechanical ventilation remains comatose, MRI Brain 12/26 worrisome for anoxic brain injury.  Transferred to [facility] for LTM EEG monitoring.  Neurology consulted. LTM with generalized epileptogenicity. Treated with Propofol, Depakote, Keppra, Ketamine. 12/31 Neurology with goals of care with family. Relayed that patient has a grim neurological recovery due to irreversible anoxic injury. Family wished to continue aggressive measures.   Palliative care consulted. Stay complicated with neuro-storming.   Surgical consult regarding PEG/trach  Pertinent Medical History:   Past Medical History:  Diagnosis Date   Elevated lipids    Hypertension    Tobacco abuse    Significant Hospital Events: Including procedures, antibiotic start and stop dates in addition to other pertinent events   12/18 Flu A positive 12/20 Admitted, 5 minute code after non-compliance with BiPAP MRSA PCR - neg Urine strep _POS 12/21 Intubated, paralyzed, ARDS protocol, on 7cc/kg 12/22 Weaned to 6 cc/kg with dyssynchrony, back on 7 cc/kg, driving pressures good, weaning vent 12/23 Attempt to wean sedation again with dyssynchrony and desaturation.  Some concern for cuff leak, tube exchange.  Cuff did not appear to be blown.  Ongoing signs of cuff leak, query leaking around cough, possible large trachea, diuresing 12/25 tolerating PSV, myoclonus> ceribell, Neuro consult, changed to propofol, diuresed  12/26  Changed to DNR. On Propofol, versed.  Tolerating PSV.  MRI brain with hypoxic/anoxic injury Blood culture neg 12/27 GPD's on EEG, pending transfer to [facility] for cEEG NEURO CONSULT 12/28 moved to [facility] for LTM EEG 12/31 family updated by neuro: no chance for meaningful recovery, they should make plans for comfort measures 1/1 cultures - resp normal flora 1/2 eeg still with epileptogenicity. Palli fam mtg -- family does not want to hear from medical teams unless there is "good news"  1/3  cEEG with burst suppression w highly epileptiform bursts.  1/4 cEEG with burst suppression  + highly epileptiform bursts still.  WBC slightly up to 22.5. Temp high 99 low 100  c-EEG stopped duie to lack of benefit Pallative care consult - wife upset about early dc from ER on 12/18 Chaplain consult: Wife is sufering from anticipatory grief + accepting god's way + curious/anger about events leading upto current state EEG: generalized epileptogenicity with high potential for seizures as well as profound diffuse encephalopathy. In the setting of cardiac arrest, this EEG pattern is suggestive of anoxic/hypoxic brain injury.  1/5 - Low grade fever +. [place] 22.5K.  on Zosyn. On vent fio2 30%. TF +. Per RN - diprivan stopped yesterday. No myoclonus today. Mild tachycardia + Rpt pall care on 10/21/22 per wife 1/6 - On vent FiO2 30%.  Tmax 99,60F ., WBC better. Still off diprivan -> No myoclonus. cXR visualized - devices in place and faiure clear Oral oxy and klonpin stopped 1/8 No significant changes in neuro status. Tachycardic to 130s, hypertensive to 140s. Coreg transitioned to metoprolol. 1/11 wife considering trach/peg, to discuss with surgery-discussed with Dr. Bedelia Person 1/12 more tachycardic 1/16-spoke  with wife at length at bedside, Dr. Hulen Skains also had a long conversation with spouse-desires to continue current aggressive care and desires PEG and trach placed 1/17 perc trach and peg  Interim History / Subjective:  Seizure  activity overnight with mouth twitching, arms shaking, diaphoresis - broke with versed x2  Perc trach and peg placed  Gabapentin started  Lactulose dc'd  Objective:  Blood pressure 106/83, pulse 93, temperature 100.2 F (37.9 C), temperature source Axillary, resp. rate 20, height 5\' 4"  (1.626 m), weight 51.1 kg, SpO2 100 %.    Vent Mode: PRVC FiO2 (%):  [30 %-40 %] 40 % Set Rate:  [15 bmp] 15 bmp Vt Set:  [550 mL] 550 mL PEEP:  [5 cmH20] 5 cmH20 Plateau Pressure:  [12 cmH20-18 cmH20] 18 cmH20   Intake/Output Summary (Last 24 hours) at 11/02/2022 1126 Last data filed at 11/02/2022 1000 Gross per 24 hour  Intake 2392.15 ml  Output 1010 ml  Net 1382.15 ml   Filed Weights   10/31/22 0500 11/01/22 0130 11/02/22 0219  Weight: 50.6 kg 52 kg 51.1 kg    General appearance: 62 y.o., male, cachectic Eyes: does not track HENT: 6 shiley cuffed flex in place Lungs: rhonchorous, with equal chest rise CV: tachy RR, no murmur  Abdomen: Soft, non-tender; non-distended, BShypoactive, peg in place Extremities: trace peripheral edema, warm Neuro: weakly flexes RUE to noxious stim otherwise opens eyes to noxious stim  Na 147 WBC 17  CXR without complication from trach placement  Resolved   Flu A AKI CAP, pneumococcal pneumonia  S/p Zosyn 12/31 - 1/7; Ceftriaxone 12/20- 12/27, Azithro 12/20, Flagyl 12/20 - 12/22  Assessment & Plan:   Respiratory failure Anoxic brain injury, ventilator dependent s/p perc trach 1/18 Acute respiratory failure with hypoxia/ARDS secondary to flu and strep pneumonia did complete course of antibiotics and Tamiflu -Routine trach care, failed to tolerate PS well earlier with tachypnea, trying again now on PS 8/5 -pulmonary toilet  Seizures -AEDs per neuro -if recurrent seizure activity today will reach out neuro  Paroxysmal sympathetic hyperactivity -On propranolol 80 every 8 -Added Neurontin every 8  Hypernatremia -adjust FWF as  needed  Leukocytosis  -Stable -No fever -Completed 7 days of Zosyn -Blood cultures 1/13 negative -Tracheal aspirate 1/13 negative  Anemia -Transfuse per protocol  Tachycardia/hypotension due to paroxysmal sympathetic hyperactivity -continue propranolol -continue cardiac monitoring  Severe protein calorie malnutrition -Continue enteric feeds  SSI for hyperglycemia  Tracheostomy/PEG tentatively scheduled for 11/01/2022 about midday  GOC documentation previously DNR status Care Giver Distress + (anticiptory grief + upset at outcome +) Remains comatose. Patient's wife has been understandably in great distress over patient's clinical status. Patient's grandson who has autism is struggling greatly with his grandfather's absence. Appreciate Palliative team's input and assistance with management/GOC conversations. - DNR code status at present - Ongoing discussion re: patient and family's wishes in terms of tracheostomy, PEG, LTACH. Wife spoke with surgery 1/12.  - Spoke with wife 1/13. She expressed her concerns with the entire stay starting at [facility]. >> See note from 1/13 for further information. But to summarize at this time wishes to proceed with trach/peg, no ready to "let him go"  1/16-had extensive conversation with spouse, Dr. Sherlynn Stalls also did have an extensive conversation with spouse-he remains DNR, continue full scope of care  Best Practice (right click and "Reselect all SmartList Selections" daily)   Diet/type: tubefeeds DVT prophylaxis: LMWH GI prophylaxis: PPI Foley:  External Foley in place  Code Status:  DNR Last date of multidisciplinary goals of care discussion: ongoing  The patient is critically ill with multiple organ systems failure and requires high complexity decision making for assessment and support, frequent evaluation and titration of therapies, application of advanced monitoring technologies and extensive interpretation of multiple databases. Critical  Care Time devoted to patient care services described in this note independent of APP/resident time (if applicable)  is 35 minutes.   Fredirick Maudlin Pulmonary/Critical Care  Personal pager: See Shea Evans If unanswered, please page CCM On-call: [phone]

## 2022-11-03 DIAGNOSIS — J9601 Acute respiratory failure with hypoxia: Secondary | ICD-10-CM | POA: Diagnosis not present

## 2022-11-03 LAB — CBC
HCT: 30.9 % — ABNORMAL LOW (ref 39.0–52.0)
Hemoglobin: 9.6 g/dL — ABNORMAL LOW (ref 13.0–17.0)
MCH: 29 pg (ref 26.0–34.0)
MCHC: 31.1 g/dL (ref 30.0–36.0)
MCV: 93.4 fL (ref 80.0–100.0)
Platelets: 193 10*3/uL (ref 150–400)
RBC: 3.31 MIL/uL — ABNORMAL LOW (ref 4.22–5.81)
RDW: 16.7 % — ABNORMAL HIGH (ref 11.5–15.5)
WBC: 13.6 10*3/uL — ABNORMAL HIGH (ref 4.0–10.5)
nRBC: 0 % (ref 0.0–0.2)

## 2022-11-03 LAB — BASIC METABOLIC PANEL
Anion gap: 11 (ref 5–15)
BUN: 35 mg/dL — ABNORMAL HIGH (ref 6–20)
CO2: 22 mmol/L (ref 22–32)
Calcium: 8.7 mg/dL — ABNORMAL LOW (ref 8.9–10.3)
Chloride: 109 mmol/L (ref 98–111)
Creatinine, Ser: 0.62 mg/dL (ref 0.61–1.24)
GFR, Estimated: >60 mL/min (ref >60)
Glucose, Bld: 127 mg/dL — ABNORMAL HIGH (ref 70–99)
Potassium: 4.7 mmol/L (ref 3.5–5.1)
Sodium: 142 mmol/L (ref 135–145)

## 2022-11-03 LAB — GLUCOSE, CAPILLARY
Glucose-Capillary: 110 mg/dL — ABNORMAL HIGH (ref 70–99)
Glucose-Capillary: 113 mg/dL — ABNORMAL HIGH (ref 70–99)
Glucose-Capillary: 116 mg/dL — ABNORMAL HIGH (ref 70–99)
Glucose-Capillary: 120 mg/dL — ABNORMAL HIGH (ref 70–99)
Glucose-Capillary: 136 mg/dL — ABNORMAL HIGH (ref 70–99)
Glucose-Capillary: 154 mg/dL — ABNORMAL HIGH (ref 70–99)

## 2022-11-03 NOTE — Plan of Care (Signed)
  Problem: Fluid Volume: Goal: Hemodynamic stability will improve Outcome: Not Progressing   Problem: Clinical Measurements: Goal: Diagnostic test results will improve Outcome: Not Progressing Goal: Signs and symptoms of infection will decrease Outcome: Not Progressing   Problem: Respiratory: Goal: Ability to maintain adequate ventilation will improve Outcome: Not Progressing   Problem: Activity: Goal: Ability to tolerate increased activity will improve Outcome: Not Progressing   Problem: Respiratory: Goal: Ability to maintain a clear airway and adequate ventilation will improve Outcome: Not Progressing   Problem: Role Relationship: Goal: Method of communication will improve Outcome: Not Progressing   Problem: Education: Goal: Ability to describe self-care measures that may prevent or decrease complications (Diabetes Survival Skills Education) will improve Outcome: Not Progressing Goal: Individualized Educational Video(s) Outcome: Not Progressing   Problem: Coping: Goal: Ability to adjust to condition or change in health will improve Outcome: Not Progressing   Problem: Fluid Volume: Goal: Ability to maintain a balanced intake and output will improve Outcome: Not Progressing   Problem: Health Behavior/Discharge Planning: Goal: Ability to identify and utilize available resources and services will improve Outcome: Not Progressing Goal: Ability to manage health-related needs will improve Outcome: Not Progressing   Problem: Metabolic: Goal: Ability to maintain appropriate glucose levels will improve Outcome: Not Progressing   Problem: Nutritional: Goal: Maintenance of adequate nutrition will improve Outcome: Not Progressing Goal: Progress toward achieving an optimal weight will improve Outcome: Not Progressing   Problem: Skin Integrity: Goal: Risk for impaired skin integrity will decrease Outcome: Not Progressing   Problem: Tissue Perfusion: Goal: Adequacy of  tissue perfusion will improve Outcome: Not Progressing   Problem: Education: Goal: Knowledge of General Education information will improve Description: Including pain rating scale, medication(s)/side effects and non-pharmacologic comfort measures Outcome: Not Progressing   Problem: Health Behavior/Discharge Planning: Goal: Ability to manage health-related needs will improve Outcome: Not Progressing   Problem: Clinical Measurements: Goal: Ability to maintain clinical measurements within normal limits will improve Outcome: Not Progressing Goal: Will remain free from infection Outcome: Not Progressing Goal: Diagnostic test results will improve Outcome: Not Progressing Goal: Respiratory complications will improve Outcome: Not Progressing Goal: Cardiovascular complication will be avoided Outcome: Not Progressing   Problem: Activity: Goal: Risk for activity intolerance will decrease Outcome: Not Progressing   Problem: Nutrition: Goal: Adequate nutrition will be maintained Outcome: Not Progressing   Problem: Coping: Goal: Level of anxiety will decrease Outcome: Not Progressing   Problem: Elimination: Goal: Will not experience complications related to bowel motility Outcome: Not Progressing Goal: Will not experience complications related to urinary retention Outcome: Not Progressing   Problem: Pain Managment: Goal: General experience of comfort will improve Outcome: Not Progressing   Problem: Safety: Goal: Ability to remain free from injury will improve Outcome: Not Progressing   Problem: Skin Integrity: Goal: Risk for impaired skin integrity will decrease Outcome: Not Progressing   

## 2022-11-03 NOTE — Plan of Care (Signed)
Patient received on PRVC ventilation as charted. Placed on PSV 8/5 until 1130 this morning when patient RR was in the high 30's and was diaphoretic. Placed back on full vent settings as charted. Trach care done without issue.

## 2022-11-03 NOTE — Progress Notes (Signed)
NAME:  Damon [name], MRN:  [id number], DOB:  05-Oct-1961, LOS: 62 ADMISSION DATE:  10/03/2022, CONSULTATION DATE:  10/03/2022 REFERRING MD:  Marylyn Ishihara - TRH CHIEF COMPLAINT:  Dyspnea   History of Present Illness:  62 year old man admitted to [facility] 12/20 in the setting of acute hypoxemic respiratory failure due to Flu A. PMHx significant for HTN, HLD, chronic back pain, tobacco abuse   Placed on BIPAP.  Coded (likely in the setting of hypoxia), initial rhythm PEA with CPR x 5 minutes, required intubation.  After several days on mechanical ventilation remains comatose, MRI Brain 12/26 worrisome for anoxic brain injury.  Transferred to [facility] for LTM EEG monitoring.  Neurology consulted. LTM with generalized epileptogenicity. Treated with Propofol, Depakote, Keppra, Ketamine. 12/31 Neurology with goals of care with family. Relayed that patient has a grim neurological recovery due to irreversible anoxic injury. Family wished to continue aggressive measures.   Palliative care consulted. Stay complicated with neuro-storming.   Surgical consult regarding PEG/trach  Pertinent Medical History:   Past Medical History:  Diagnosis Date   Elevated lipids    Hypertension    Tobacco abuse    Significant Hospital Events: Including procedures, antibiotic start and stop dates in addition to other pertinent events   12/18 Flu A positive 12/20 Admitted, 5 minute code after non-compliance with BiPAP MRSA PCR - neg Urine strep _POS 12/21 Intubated, paralyzed, ARDS protocol, on 7cc/kg 12/22 Weaned to 6 cc/kg with dyssynchrony, back on 7 cc/kg, driving pressures good, weaning vent 12/23 Attempt to wean sedation again with dyssynchrony and desaturation.  Some concern for cuff leak, tube exchange.  Cuff did not appear to be blown.  Ongoing signs of cuff leak, query leaking around cough, possible large trachea, diuresing 12/25 tolerating PSV, myoclonus> ceribell, Neuro consult, changed to propofol, diuresed  12/26  Changed to DNR. On Propofol, versed.  Tolerating PSV.  MRI brain with hypoxic/anoxic injury Blood culture neg 12/27 GPD's on EEG, pending transfer to [facility] for cEEG NEURO CONSULT 12/28 moved to [facility] for LTM EEG 12/31 family updated by neuro: no chance for meaningful recovery, they should make plans for comfort measures 1/1 cultures - resp normal flora 1/2 eeg still with epileptogenicity. Palli fam mtg -- family does not want to hear from medical teams unless there is "good news"  1/3  cEEG with burst suppression w highly epileptiform bursts.  1/4 cEEG with burst suppression  + highly epileptiform bursts still.  WBC slightly up to 22.5. Temp high 99 low 100  c-EEG stopped duie to lack of benefit Pallative care consult - wife upset about early dc from ER on 12/18 Chaplain consult: Wife is sufering from anticipatory grief + accepting god's way + curious/anger about events leading upto current state EEG: generalized epileptogenicity with high potential for seizures as well as profound diffuse encephalopathy. In the setting of cardiac arrest, this EEG pattern is suggestive of anoxic/hypoxic brain injury.  1/5 - Low grade fever +. [place] 22.5K.  on Zosyn. On vent fio2 30%. TF +. Per RN - diprivan stopped yesterday. No myoclonus today. Mild tachycardia + Rpt pall care on 10/21/22 per wife 1/6 - On vent FiO2 30%.  Tmax 99,57F ., WBC better. Still off diprivan -> No myoclonus. cXR visualized - devices in place and faiure clear Oral oxy and klonpin stopped 1/8 No significant changes in neuro status. Tachycardic to 130s, hypertensive to 140s. Coreg transitioned to metoprolol. 1/11 wife considering trach/peg, to discuss with surgery-discussed with Dr. Bobbye Morton 1/12 more tachycardic 1/16-spoke  with wife at length at bedside, Dr. Hulen Skains also had a long conversation with spouse-desires to continue current aggressive care and desires PEG and trach placed 1/17 perc trach and peg  Interim History / Subjective:  No acute  issues   Tolerated PS wean 8/5 for a few hours yesterday  Objective:  Blood pressure 122/75, pulse (!) 108, temperature 98.9 F (37.2 C), temperature source Axillary, resp. rate (!) 22, height 5\' 4"  (1.626 m), weight 51.6 kg, SpO2 95 %.    Vent Mode: PSV;CPAP FiO2 (%):  [40 %] 40 % Set Rate:  [15 bmp] 15 bmp Vt Set:  [550 mL] 550 mL PEEP:  [5 cmH20] 5 cmH20 Pressure Support:  [8 cmH20] 8 cmH20 Plateau Pressure:  [17 cmH20-18 cmH20] 18 cmH20   Intake/Output Summary (Last 24 hours) at 11/03/2022 0854 Last data filed at 11/03/2022 0800 Gross per 24 hour  Intake 1714.92 ml  Output 2005 ml  Net -290.08 ml   Filed Weights   11/01/22 0130 11/02/22 0219 11/03/22 0500  Weight: 52 kg 51.1 kg 51.6 kg    General appearance: 62 y.o., male, cachectic Eyes: does not track HENT: 6 shiley cuffed flex in place Lungs: rhonchorous, with equal chest rise CV: tachy RR, no murmur  Abdomen: Soft, non-tender; non-distended, BS hypoactive, peg in place Extremities: trace peripheral edema, warm Neuro: weakly flexes RUE to noxious stim otherwise opens eyes to noxious stim  BMP pending WBC 13.6 Hb stable  No new imaging  Resolved   Flu A AKI CAP, pneumococcal pneumonia  S/p Zosyn 12/31 - 1/7; Ceftriaxone 12/20- 12/27, Azithro 12/20, Flagyl 12/20 - 12/22  Assessment & Plan:   Respiratory failure Anoxic brain injury, ventilator dependent s/p perc trach 1/18 Acute respiratory failure with hypoxia/ARDS secondary to flu and strep pneumonia did complete course of antibiotics and Tamiflu -Routine trach care -PS wean as tolerated - would like to at least try TCT today even if fails immediately -pulmonary toilet  Seizures -AEDs per neuro  Paroxysmal sympathetic hyperactivity -On propranolol 80 every 8 -Neurontin every 8  Hypernatremia -adjust FWF as needed  Leukocytosis  -Stable -No fever -Completed 7 days of Zosyn -Blood cultures 1/13 negative -Tracheal aspirate 1/13  negative  Anemia -Transfuse per protocol  Tachycardia/hypotension due to paroxysmal sympathetic hyperactivity -continue propranolol -continue cardiac monitoring  Severe protein calorie malnutrition -Continue enteric feeds  SSI for hyperglycemia  Tracheostomy/PEG tentatively scheduled for 11/01/2022 about midday  GOC documentation previously DNR status Care Giver Distress + (anticiptory grief + upset at outcome +) Remains comatose. Patient's wife has been understandably in great distress over patient's clinical status. Patient's grandson who has autism is struggling greatly with his grandfather's absence. Appreciate Palliative team's input and assistance with management/GOC conversations. - DNR code status at present - Ongoing discussion re: patient and family's wishes in terms of tracheostomy, PEG, LTACH. Wife spoke with surgery 1/12.  - Spoke with wife 1/13. She expressed her concerns with the entire stay starting at [facility]. >> See note from 1/13 for further information. But to summarize at this time wishes to proceed with trach/peg, no ready to "let him go"  1/16-had extensive conversation with spouse, Dr. Sherlynn Stalls also did have an extensive conversation with spouse-he remains DNR, continue full scope of care  Best Practice (right click and "Reselect all SmartList Selections" daily)   Diet/type: tubefeeds DVT prophylaxis: LMWH GI prophylaxis: PPI Foley:  External Foley in place  Code Status:  DNR Last date of multidisciplinary goals of care discussion: ongoing  The patient is critically ill with multiple organ systems failure and requires high complexity decision making for assessment and support, frequent evaluation and titration of therapies, application of advanced monitoring technologies and extensive interpretation of multiple databases. Critical Care Time devoted to patient care services described in this note independent of APP/resident time (if applicable)  is 36  minutes.   Laroy Apple Pulmonary/Critical Care  Personal pager: See Loretha Stapler If unanswered, please page CCM On-call: #[phone]

## 2022-11-04 ENCOUNTER — Inpatient Hospital Stay (HOSPITAL_COMMUNITY): Payer: Medicare Other

## 2022-11-04 DIAGNOSIS — J9601 Acute respiratory failure with hypoxia: Secondary | ICD-10-CM | POA: Diagnosis not present

## 2022-11-04 LAB — GLUCOSE, CAPILLARY
Glucose-Capillary: 115 mg/dL — ABNORMAL HIGH (ref 70–99)
Glucose-Capillary: 128 mg/dL — ABNORMAL HIGH (ref 70–99)
Glucose-Capillary: 130 mg/dL — ABNORMAL HIGH (ref 70–99)
Glucose-Capillary: 139 mg/dL — ABNORMAL HIGH (ref 70–99)
Glucose-Capillary: 145 mg/dL — ABNORMAL HIGH (ref 70–99)
Glucose-Capillary: 93 mg/dL (ref 70–99)

## 2022-11-04 LAB — BASIC METABOLIC PANEL
Anion gap: 10 (ref 5–15)
BUN: 37 mg/dL — ABNORMAL HIGH (ref 6–20)
CO2: 21 mmol/L — ABNORMAL LOW (ref 22–32)
Calcium: 8.5 mg/dL — ABNORMAL LOW (ref 8.9–10.3)
Chloride: 106 mmol/L (ref 98–111)
Creatinine, Ser: 0.61 mg/dL (ref 0.61–1.24)
GFR, Estimated: >60 mL/min (ref >60)
Glucose, Bld: 118 mg/dL — ABNORMAL HIGH (ref 70–99)
Potassium: 4.7 mmol/L (ref 3.5–5.1)
Sodium: 137 mmol/L (ref 135–145)

## 2022-11-04 LAB — CBC
HCT: 28.6 % — ABNORMAL LOW (ref 39.0–52.0)
Hemoglobin: 8.7 g/dL — ABNORMAL LOW (ref 13.0–17.0)
MCH: 28.6 pg (ref 26.0–34.0)
MCHC: 30.4 g/dL (ref 30.0–36.0)
MCV: 94.1 fL (ref 80.0–100.0)
Platelets: 178 10*3/uL (ref 150–400)
RBC: 3.04 MIL/uL — ABNORMAL LOW (ref 4.22–5.81)
RDW: 16.5 % — ABNORMAL HIGH (ref 11.5–15.5)
WBC: 10.2 10*3/uL (ref 4.0–10.5)
nRBC: 0 % (ref 0.0–0.2)

## 2022-11-04 MED ORDER — VITAL 1.5 CAL PO LIQD
1000.0000 mL | ORAL | Status: DC
  Administered 2022-11-04 – 2022-11-07 (×4): 1000 mL
  Filled 2022-11-04 (×4): qty 1000

## 2022-11-04 MED ORDER — MEDIHONEY WOUND/BURN DRESSING EX PSTE
1.0000 | PASTE | Freq: Every day | CUTANEOUS | Status: DC
  Administered 2022-11-05 – 2023-01-21 (×74): 1 via TOPICAL
  Filled 2022-11-04 (×5): qty 44

## 2022-11-04 MED ORDER — FREE WATER
300.0000 mL | Freq: Four times a day (QID) | Status: DC
  Administered 2022-11-04 – 2022-11-05 (×4): 300 mL

## 2022-11-04 NOTE — Progress Notes (Signed)
NAME:  Damon [name], MRN:  [id number], DOB:  09-06-1961, LOS: 32 ADMISSION DATE:  10/03/2022, CONSULTATION DATE:  10/03/2022 REFERRING MD:  Ronaldo Miyamoto - TRH CHIEF COMPLAINT:  Dyspnea   History of Present Illness:  62 year old man admitted to [facility] 12/20 in the setting of acute hypoxemic respiratory failure due to Flu A. PMHx significant for HTN, HLD, chronic back pain, tobacco abuse   Placed on BIPAP.  Coded (likely in the setting of hypoxia), initial rhythm PEA with CPR x 5 minutes, required intubation.  After several days on mechanical ventilation remains comatose, MRI Brain 12/26 worrisome for anoxic brain injury.  Transferred to [facility] for LTM EEG monitoring.  Neurology consulted. LTM with generalized epileptogenicity. Treated with Propofol, Depakote, Keppra, Ketamine. 12/31 Neurology with goals of care with family. Relayed that patient has a grim neurological recovery due to irreversible anoxic injury. Family wished to continue aggressive measures.   Palliative care consulted. Stay complicated with neuro-storming.   Surgical consult regarding PEG/trach  Pertinent Medical History:   Past Medical History:  Diagnosis Date   Elevated lipids    Hypertension    Tobacco abuse    Significant Hospital Events: Including procedures, antibiotic start and stop dates in addition to other pertinent events   12/18 Flu A positive 12/20 Admitted, 5 minute code after non-compliance with BiPAP MRSA PCR - neg Urine strep _POS 12/21 Intubated, paralyzed, ARDS protocol, on 7cc/kg 12/22 Weaned to 6 cc/kg with dyssynchrony, back on 7 cc/kg, driving pressures good, weaning vent 12/23 Attempt to wean sedation again with dyssynchrony and desaturation.  Some concern for cuff leak, tube exchange.  Cuff did not appear to be blown.  Ongoing signs of cuff leak, query leaking around cough, possible large trachea, diuresing 12/25 tolerating PSV, myoclonus> ceribell, Neuro consult, changed to propofol, diuresed  12/26  Changed to DNR. On Propofol, versed.  Tolerating PSV.  MRI brain with hypoxic/anoxic injury Blood culture neg 12/27 GPD's on EEG, pending transfer to [facility] for cEEG NEURO CONSULT 12/28 moved to [facility] for LTM EEG 12/31 family updated by neuro: no chance for meaningful recovery, they should make plans for comfort measures 1/1 cultures - resp normal flora 1/2 eeg still with epileptogenicity. Palli fam mtg -- family does not want to hear from medical teams unless there is "good news"  1/3  cEEG with burst suppression w highly epileptiform bursts.  1/4 cEEG with burst suppression  + highly epileptiform bursts still.  WBC slightly up to 22.5. Temp high 99 low 100  c-EEG stopped duie to lack of benefit Pallative care consult - wife upset about early dc from ER on 12/18 Chaplain consult: Wife is sufering from anticipatory grief + accepting god's way + curious/anger about events leading upto current state EEG: generalized epileptogenicity with high potential for seizures as well as profound diffuse encephalopathy. In the setting of cardiac arrest, this EEG pattern is suggestive of anoxic/hypoxic brain injury.  1/5 - Low grade fever +. [place] 22.5K.  on Zosyn. On vent fio2 30%. TF +. Per RN - diprivan stopped yesterday. No myoclonus today. Mild tachycardia + Rpt pall care on 10/21/22 per wife 1/6 - On vent FiO2 30%.  Tmax 99,57F ., WBC better. Still off diprivan -> No myoclonus. cXR visualized - devices in place and faiure clear Oral oxy and klonpin stopped 1/8 No significant changes in neuro status. Tachycardic to 130s, hypertensive to 140s. Coreg transitioned to metoprolol. 1/11 wife considering trach/peg, to discuss with surgery-discussed with Dr. Bedelia Person 1/12 more tachycardic 1/16-spoke  with wife at length at bedside, Dr. Hulen Skains also had a long conversation with spouse-desires to continue current aggressive care and desires PEG and trach placed 1/17 perc trach and peg  Interim History / Subjective:  Febrile  102 yesterday. Afebrile this am  Objective:  Blood pressure 115/78, pulse (!) 107, temperature 99.5 F (37.5 C), temperature source Oral, resp. rate (!) 26, height 5\' 4"  (1.626 m), weight 52.2 kg, SpO2 100 %.    Vent Mode: PRVC FiO2 (%):  [40 %] 40 % Set Rate:  [15 bmp] 15 bmp Vt Set:  [550 mL] 550 mL PEEP:  [5 cmH20] 5 cmH20 Pressure Support:  [8 cmH20] 8 cmH20 Plateau Pressure:  [15 cmH20-18 cmH20] 18 cmH20   Intake/Output Summary (Last 24 hours) at 11/04/2022 0723 Last data filed at 11/04/2022 0600 Gross per 24 hour  Intake 3295 ml  Output 2380 ml  Net 915 ml   Filed Weights   11/02/22 0219 11/03/22 0500 11/04/22 0500  Weight: 51.1 kg 51.6 kg 52.2 kg   Physical Exam: General: Thin, chronically ill-appearing, no acute distress HENT: [place], AT, OP clear, MMM Neck: Trach #6 cuffed shiley in place Eyes: EOMI, no scleral icterus Respiratory: Rhonchi bilaterally Cardiovascular: Tachy, RR, -M/R/G, no JVD GI: BS+, soft, nontender, PEG tube Extremities:-Edema,-tenderness Neuro: Eyes open, unresponsive to noxious stimuli, +cough/gag  WBC normalized BMET acceptable  No new imaging  Resolved   Flu A AKI CAP, pneumococcal pneumonia  S/p Zosyn 12/31 - 1/7; Ceftriaxone 12/20- 12/27, Azithro 12/20, Flagyl 12/20 - 12/22  Assessment & Plan:   Respiratory failure Anoxic brain injury, ventilator dependent s/p perc trach 1/18 Acute respiratory failure with hypoxia/ARDS secondary to flu and strep pneumonia did complete course of antibiotics and Tamiflu -Routine trach care -PS wean as tolerated - would like to at least try TCT today even if fails immediately -pulmonary toilet  Seizures -AEDs per neuro: Keppra and Depakene  Paroxysmal sympathetic hyperactivity -On propranolol 80 every 8 and metoprolol -Neurontin every 8  Hypernatremia - resolved -Reduce FWF as needed  Fever - unclear etiology Leukocytosis - improved -Completed 7 days of Zosyn -Blood cultures 1/13  negative -Tracheal aspirate 1/13 negative -Repeat Bcx and trach aspiration 1/21 for persistent fever -Holding on antibiotics for now  Anemia -Transfuse per protocol  Tachycardia/hypotension due to paroxysmal sympathetic hyperactivity -continue propranolol and metoprolol -continue cardiac monitoring  Severe protein calorie malnutrition -Continue enteric feeds  SSI for hyperglycemia  GOC documentation previously DNR status Care Giver Distress + (anticiptory grief + upset at outcome +) Remains comatose. Patient's wife has been understandably in great distress over patient's clinical status. Patient's grandson who has autism is struggling greatly with his grandfather's absence. Appreciate Palliative team's input and assistance with management/GOC conversations. - DNR code status at present - Ongoing discussion re: patient and family's wishes in terms of tracheostomy, PEG, LTACH. Wife spoke with surgery 1/12.  - Spoke with wife 1/13. She expressed her concerns with the entire stay starting at [facility]. >> See note from 1/13 for further information. But to summarize at this time wishes to proceed with trach/peg, no ready to "let him go"  1/16-had extensive conversation with spouse, Dr. Sherlynn Stalls also did have an extensive conversation with spouse-he remains DNR, continue full scope of care  Best Practice (right click and "Reselect all SmartList Selections" daily)   Diet/type: tubefeeds DVT prophylaxis: LMWH GI prophylaxis: PPI Foley:  External Foley in place  Code Status:  DNR Last date of multidisciplinary goals of care discussion:  ongoing   The patient is critically ill with multiple organ systems failure and requires high complexity decision making for assessment and support, frequent evaluation and titration of therapies, application of advanced monitoring technologies and extensive interpretation of multiple databases.  Independent Critical Care Time: 30 Minutes.   Rodman Pickle,  M.D. [facility] Pulmonary/Critical Care Medicine 11/04/2022 7:24 AM   Please see Amion for pager number to reach on-call Pulmonary and Critical Care Team.

## 2022-11-04 NOTE — Progress Notes (Signed)
Swapped patient back over to [facility] from CPAP at this time due to resp of 38 and HR of 121.  RT made aware.

## 2022-11-04 NOTE — Progress Notes (Signed)
Placed patient on 40% trach collar. Will continue to monitor respiratory status.

## 2022-11-04 NOTE — Plan of Care (Signed)
  Problem: Fluid Volume: Goal: Hemodynamic stability will improve Outcome: Not Progressing   Problem: Clinical Measurements: Goal: Diagnostic test results will improve Outcome: Not Progressing Goal: Signs and symptoms of infection will decrease Outcome: Not Progressing   Problem: Respiratory: Goal: Ability to maintain adequate ventilation will improve Outcome: Not Progressing   Problem: Activity: Goal: Ability to tolerate increased activity will improve Outcome: Not Progressing   Problem: Respiratory: Goal: Ability to maintain a clear airway and adequate ventilation will improve Outcome: Not Progressing   Problem: Role Relationship: Goal: Method of communication will improve Outcome: Not Progressing   Problem: Education: Goal: Ability to describe self-care measures that may prevent or decrease complications (Diabetes Survival Skills Education) will improve Outcome: Not Progressing Goal: Individualized Educational Video(s) Outcome: Not Progressing   Problem: Coping: Goal: Ability to adjust to condition or change in health will improve Outcome: Not Progressing   Problem: Fluid Volume: Goal: Ability to maintain a balanced intake and output will improve Outcome: Not Progressing   Problem: Health Behavior/Discharge Planning: Goal: Ability to identify and utilize available resources and services will improve Outcome: Not Progressing Goal: Ability to manage health-related needs will improve Outcome: Not Progressing   Problem: Metabolic: Goal: Ability to maintain appropriate glucose levels will improve Outcome: Not Progressing   Problem: Nutritional: Goal: Maintenance of adequate nutrition will improve Outcome: Not Progressing Goal: Progress toward achieving an optimal weight will improve Outcome: Not Progressing   Problem: Skin Integrity: Goal: Risk for impaired skin integrity will decrease Outcome: Not Progressing   Problem: Tissue Perfusion: Goal: Adequacy of  tissue perfusion will improve Outcome: Not Progressing   Problem: Education: Goal: Knowledge of General Education information will improve Description: Including pain rating scale, medication(s)/side effects and non-pharmacologic comfort measures Outcome: Not Progressing   Problem: Health Behavior/Discharge Planning: Goal: Ability to manage health-related needs will improve Outcome: Not Progressing   Problem: Clinical Measurements: Goal: Ability to maintain clinical measurements within normal limits will improve Outcome: Not Progressing Goal: Will remain free from infection Outcome: Not Progressing Goal: Diagnostic test results will improve Outcome: Not Progressing Goal: Respiratory complications will improve Outcome: Not Progressing Goal: Cardiovascular complication will be avoided Outcome: Not Progressing   Problem: Activity: Goal: Risk for activity intolerance will decrease Outcome: Not Progressing   Problem: Nutrition: Goal: Adequate nutrition will be maintained Outcome: Not Progressing   Problem: Coping: Goal: Level of anxiety will decrease Outcome: Not Progressing   Problem: Elimination: Goal: Will not experience complications related to bowel motility Outcome: Not Progressing Goal: Will not experience complications related to urinary retention Outcome: Not Progressing   Problem: Pain Managment: Goal: General experience of comfort will improve Outcome: Not Progressing   Problem: Safety: Goal: Ability to remain free from injury will improve Outcome: Not Progressing   Problem: Skin Integrity: Goal: Risk for impaired skin integrity will decrease Outcome: Not Progressing   

## 2022-11-04 NOTE — Progress Notes (Signed)
Nutrition Brief Note  RD received notice from pharmacy that hospital is out of stock of Osmolite 1.5. Unit does not have any floor stock. RD to adjust regimen.   Tube Feeds via PEG: -Switch to Vital 1.5 at 65 mL/hr  - ProSource TF20 - BID Provides 2500 kcal, 145 gm protein, and 1192 mL free water daily.    Hermina Barters RD, LDN Clinical Dietitian See Shea Evans for contact information.

## 2022-11-05 DIAGNOSIS — J9601 Acute respiratory failure with hypoxia: Secondary | ICD-10-CM | POA: Diagnosis not present

## 2022-11-05 LAB — CBC
HCT: 33.5 % — ABNORMAL LOW (ref 39.0–52.0)
Hemoglobin: 10.5 g/dL — ABNORMAL LOW (ref 13.0–17.0)
MCH: 28.6 pg (ref 26.0–34.0)
MCHC: 31.3 g/dL (ref 30.0–36.0)
MCV: 91.3 fL (ref 80.0–100.0)
Platelets: 180 10*3/uL (ref 150–400)
RBC: 3.67 MIL/uL — ABNORMAL LOW (ref 4.22–5.81)
RDW: 16.3 % — ABNORMAL HIGH (ref 11.5–15.5)
WBC: 12.3 10*3/uL — ABNORMAL HIGH (ref 4.0–10.5)
nRBC: 0 % (ref 0.0–0.2)

## 2022-11-05 LAB — COMPREHENSIVE METABOLIC PANEL
ALT: 108 U/L — ABNORMAL HIGH (ref 0–44)
AST: 56 U/L — ABNORMAL HIGH (ref 15–41)
Albumin: 2 g/dL — ABNORMAL LOW (ref 3.5–5.0)
Alkaline Phosphatase: 103 U/L (ref 38–126)
Anion gap: 8 (ref 5–15)
BUN: 37 mg/dL — ABNORMAL HIGH (ref 6–20)
CO2: 25 mmol/L (ref 22–32)
Calcium: 8.7 mg/dL — ABNORMAL LOW (ref 8.9–10.3)
Chloride: 102 mmol/L (ref 98–111)
Creatinine, Ser: 0.56 mg/dL — ABNORMAL LOW (ref 0.61–1.24)
GFR, Estimated: >60 mL/min (ref >60)
Glucose, Bld: 98 mg/dL (ref 70–99)
Potassium: 5.3 mmol/L — ABNORMAL HIGH (ref 3.5–5.1)
Sodium: 135 mmol/L (ref 135–145)
Total Bilirubin: 0.5 mg/dL (ref 0.3–1.2)
Total Protein: 6.9 g/dL (ref 6.5–8.1)

## 2022-11-05 LAB — GLUCOSE, CAPILLARY
Glucose-Capillary: 88 mg/dL (ref 70–99)
Glucose-Capillary: 103 mg/dL — ABNORMAL HIGH (ref 70–99)
Glucose-Capillary: 102 mg/dL — ABNORMAL HIGH (ref 70–99)
Glucose-Capillary: 142 mg/dL — ABNORMAL HIGH (ref 70–99)
Glucose-Capillary: 137 mg/dL — ABNORMAL HIGH (ref 70–99)
Glucose-Capillary: 130 mg/dL — ABNORMAL HIGH (ref 70–99)

## 2022-11-05 LAB — BASIC METABOLIC PANEL
Anion gap: 10 (ref 5–15)
BUN: 46 mg/dL — ABNORMAL HIGH (ref 6–20)
CO2: 26 mmol/L (ref 22–32)
Calcium: 9.1 mg/dL (ref 8.9–10.3)
Chloride: 101 mmol/L (ref 98–111)
Creatinine, Ser: 0.59 mg/dL — ABNORMAL LOW (ref 0.61–1.24)
GFR, Estimated: >60 mL/min (ref >60)
Glucose, Bld: 146 mg/dL — ABNORMAL HIGH (ref 70–99)
Potassium: 4.4 mmol/L (ref 3.5–5.1)
Sodium: 137 mmol/L (ref 135–145)

## 2022-11-05 MED ORDER — FREE WATER
200.0000 mL | Freq: Three times a day (TID) | Status: DC
  Administered 2022-11-05 – 2022-11-07 (×7): 200 mL

## 2022-11-05 MED ORDER — SODIUM CHLORIDE 0.9 % IV SOLN
2.0000 g | Freq: Three times a day (TID) | INTRAVENOUS | Status: AC
  Administered 2022-11-05 – 2022-11-12 (×21): 2 g via INTRAVENOUS
  Filled 2022-11-05 (×21): qty 12.5

## 2022-11-05 MED ORDER — FUROSEMIDE 10 MG/ML IJ SOLN
40.0000 mg | Freq: Once | INTRAMUSCULAR | Status: AC
  Administered 2022-11-05: 40 mg via INTRAVENOUS
  Filled 2022-11-05: qty 4

## 2022-11-05 NOTE — Progress Notes (Signed)
[facility] called:  Stated they would be signing off at this time if their were to be a decline to please call back and  make a new referral.

## 2022-11-05 NOTE — Progress Notes (Signed)
eLink Physician-Brief Progress Note Patient Name: Damon [name] DOB: 23-Aug-1961 MRN: [id number]   Date of Service  11/05/2022  HPI/Events of Note  Elink requested to assess possible transfer out of ICU  On camera check patient stable on trach collar >24h. Discussed with RT at bedside, no secretions, breath sounds clear  Recent imaging yesterday with worsening LLL infiltrate however clinically doing well  eICU Interventions  Transfer to Progressive bed. Pulmonary remains primary. Will communicate to day team      Intervention Category Minor Interventions: Clinical assessment - ordering diagnostic tests  Annessa Satre Rodman Pickle 11/05/2022, 9:04 PM

## 2022-11-05 NOTE — Plan of Care (Signed)
  Problem: Fluid Volume: Goal: Hemodynamic stability will improve Outcome: Not Progressing   Problem: Clinical Measurements: Goal: Diagnostic test results will improve Outcome: Not Progressing Goal: Signs and symptoms of infection will decrease Outcome: Not Progressing   Problem: Respiratory: Goal: Ability to maintain adequate ventilation will improve Outcome: Not Progressing   Problem: Activity: Goal: Ability to tolerate increased activity will improve Outcome: Not Progressing   Problem: Respiratory: Goal: Ability to maintain a clear airway and adequate ventilation will improve Outcome: Not Progressing   Problem: Role Relationship: Goal: Method of communication will improve Outcome: Not Progressing   Problem: Education: Goal: Ability to describe self-care measures that may prevent or decrease complications (Diabetes Survival Skills Education) will improve Outcome: Not Progressing Goal: Individualized Educational Video(s) Outcome: Not Progressing   Problem: Coping: Goal: Ability to adjust to condition or change in health will improve Outcome: Not Progressing   Problem: Fluid Volume: Goal: Ability to maintain a balanced intake and output will improve Outcome: Not Progressing   Problem: Health Behavior/Discharge Planning: Goal: Ability to identify and utilize available resources and services will improve Outcome: Not Progressing Goal: Ability to manage health-related needs will improve Outcome: Not Progressing   Problem: Metabolic: Goal: Ability to maintain appropriate glucose levels will improve Outcome: Not Progressing   Problem: Nutritional: Goal: Maintenance of adequate nutrition will improve Outcome: Not Progressing Goal: Progress toward achieving an optimal weight will improve Outcome: Not Progressing   Problem: Skin Integrity: Goal: Risk for impaired skin integrity will decrease Outcome: Not Progressing   Problem: Tissue Perfusion: Goal: Adequacy of  tissue perfusion will improve Outcome: Not Progressing   Problem: Education: Goal: Knowledge of General Education information will improve Description: Including pain rating scale, medication(s)/side effects and non-pharmacologic comfort measures Outcome: Not Progressing   Problem: Health Behavior/Discharge Planning: Goal: Ability to manage health-related needs will improve Outcome: Not Progressing   Problem: Clinical Measurements: Goal: Ability to maintain clinical measurements within normal limits will improve Outcome: Not Progressing Goal: Will remain free from infection Outcome: Not Progressing Goal: Diagnostic test results will improve Outcome: Not Progressing Goal: Respiratory complications will improve Outcome: Not Progressing Goal: Cardiovascular complication will be avoided Outcome: Not Progressing   Problem: Activity: Goal: Risk for activity intolerance will decrease Outcome: Not Progressing   Problem: Nutrition: Goal: Adequate nutrition will be maintained Outcome: Not Progressing   Problem: Coping: Goal: Level of anxiety will decrease Outcome: Not Progressing   Problem: Elimination: Goal: Will not experience complications related to bowel motility Outcome: Not Progressing Goal: Will not experience complications related to urinary retention Outcome: Not Progressing   Problem: Pain Managment: Goal: General experience of comfort will improve Outcome: Not Progressing   Problem: Safety: Goal: Ability to remain free from injury will improve Outcome: Not Progressing   Problem: Skin Integrity: Goal: Risk for impaired skin integrity will decrease Outcome: Not Progressing

## 2022-11-05 NOTE — Progress Notes (Signed)
NAME:  Damon [name], MRN:  [id number], DOB:  1961/09/08, LOS: 68 ADMISSION DATE:  10/03/2022, CONSULTATION DATE:  10/03/2022 REFERRING MD:  Marylyn Ishihara - TRH CHIEF COMPLAINT:  Dyspnea   History of Present Illness:  62 year old man admitted to [facility] 12/20 in the setting of acute hypoxemic respiratory failure due to Flu A. PMHx significant for HTN, HLD, chronic back pain, tobacco abuse   Placed on BIPAP.  Coded (likely in the setting of hypoxia), initial rhythm PEA with CPR x 5 minutes, required intubation.  After several days on mechanical ventilation remains comatose, MRI Brain 12/26 worrisome for anoxic brain injury.  Transferred to [facility] for LTM EEG monitoring.  Neurology consulted. LTM with generalized epileptogenicity. Treated with Propofol, Depakote, Keppra, Ketamine. 12/31 Neurology with goals of care with family. Relayed that patient has a grim neurological recovery due to irreversible anoxic injury. Family wished to continue aggressive measures.   Palliative care consulted. Stay complicated with neuro-storming.   Surgical consult regarding PEG/trach  Pertinent Medical History:   Past Medical History:  Diagnosis Date   Elevated lipids    Hypertension    Tobacco abuse    Significant Hospital Events: Including procedures, antibiotic start and stop dates in addition to other pertinent events   12/18 Flu A positive 12/20 Admitted, 5 minute code after non-compliance with BiPAP MRSA PCR - neg Urine strep _POS 12/21 Intubated, paralyzed, ARDS protocol, on 7cc/kg 12/22 Weaned to 6 cc/kg with dyssynchrony, back on 7 cc/kg, driving pressures good, weaning vent 12/23 Attempt to wean sedation again with dyssynchrony and desaturation.  Some concern for cuff leak, tube exchange.  Cuff did not appear to be blown.  Ongoing signs of cuff leak, query leaking around cough, possible large trachea, diuresing 12/25 tolerating PSV, myoclonus> ceribell, Neuro consult, changed to propofol, diuresed  12/26  Changed to DNR. On Propofol, versed.  Tolerating PSV.  MRI brain with hypoxic/anoxic injury Blood culture neg 12/27 GPD's on EEG, pending transfer to [facility] for cEEG NEURO CONSULT 12/28 moved to [facility] for LTM EEG 12/31 family updated by neuro: no chance for meaningful recovery, they should make plans for comfort measures 1/1 cultures - resp normal flora 1/2 eeg still with epileptogenicity. Palli fam mtg -- family does not want to hear from medical teams unless there is "good news"  1/3  cEEG with burst suppression w highly epileptiform bursts.  1/4 cEEG with burst suppression  + highly epileptiform bursts still.  WBC slightly up to 22.5. Temp high 99 low 100  c-EEG stopped duie to lack of benefit Pallative care consult - wife upset about early dc from ER on 12/18 Chaplain consult: Wife is sufering from anticipatory grief + accepting god's way + curious/anger about events leading upto current state EEG: generalized epileptogenicity with high potential for seizures as well as profound diffuse encephalopathy. In the setting of cardiac arrest, this EEG pattern is suggestive of anoxic/hypoxic brain injury.  1/5 - Low grade fever +. [place] 22.5K.  on Zosyn. On vent fio2 30%. TF +. Per RN - diprivan stopped yesterday. No myoclonus today. Mild tachycardia + Rpt pall care on 10/21/22 per wife 1/6 - On vent FiO2 30%.  Tmax 99,6F ., WBC better. Still off diprivan -> No myoclonus. cXR visualized - devices in place and faiure clear Oral oxy and klonpin stopped 1/8 No significant changes in neuro status. Tachycardic to 130s, hypertensive to 140s. Coreg transitioned to metoprolol. 1/11 wife considering trach/peg, to discuss with surgery-discussed with Dr. Bobbye Morton 1/12 more tachycardic 1/16-spoke  with wife at length at bedside, Dr. Lindie Spruce also had a long conversation with spouse-desires to continue current aggressive care and desires PEG and trach placed 1/18 perc trach and peg  Interim History / Subjective:  Febrile  101.1 last 24 hours. Temp this am is 99.3 BC from 1/21 no growth to date Tracheal aspirate 1/21 pending>> GS with FEW GRAM NEGATIVE RODS  FEW WBC PRESENT, PREDOMINANTLY PMN   Objective:  Blood pressure 135/80, pulse (!) 103, temperature 98.5 F (36.9 C), temperature source Axillary, resp. rate 20, height 5\' 4"  (1.626 m), weight 54.4 kg, SpO2 100 %.    Vent Mode: PRVC FiO2 (%):  [30 %-40 %] 40 % Set Rate:  [15 bmp] 15 bmp Vt Set:  [550 mL] 550 mL PEEP:  [5 cmH20] 5 cmH20 Plateau Pressure:  [12 cmH20-14 cmH20] 12 cmH20   Intake/Output Summary (Last 24 hours) at 11/05/2022 0731 Last data filed at 11/05/2022 0600 Gross per 24 hour  Intake 2395 ml  Output 1935 ml  Net 460 ml   Filed Weights   11/03/22 0500 11/04/22 0500 11/05/22 0500  Weight: 51.6 kg 52.2 kg 54.4 kg   Physical Exam: General: Thin, chronically ill-appearing, no acute distress on ATC HENT: [place], AT, OP clear, MMM Neck: Trach #6 cuffed shiley in place Eyes: EOMI, no scleral icterus Respiratory:  Cardiovascular: Tachy, RR, -M/R/G, no JVD GI: BS+, soft, nontender, PEG tube Extremities:-Edema,-tenderness Neuro: Eyes open, unresponsive to noxious stimuli, +cough/gag  WBC 12.3 from 10.2 , HGB 10.5, platelets 180 K CMP K 5.3, Cl 102,BUN 37, Creatinine 0.56, Ca 8.7,( Corrects to 10.3) Albumin 2.0, AST 56, ALT 108( Both down trending)  CXR 11/04/2022>> Worsening airspace opacification over the left mid to lower lung likely infection.Currently holding antibiotics  Net + 2800 cc's  Resolved   Flu A AKI CAP, pneumococcal pneumonia  S/p Zosyn 12/31 - 1/7; Ceftriaxone 12/20- 12/27, Azithro 12/20, Flagyl 12/20 - 12/22  Assessment & Plan:   Respiratory failure Anoxic brain injury, ventilator dependent s/p perc trach 1/18 Acute respiratory failure with hypoxia/ARDS secondary to flu and strep pneumonia did complete course of antibiotics and Tamiflu -Routine trach care -PS wean as tolerated -tolerating ATC at  40% -pulmonary toilet  - CXR in am and prn  Seizures -AEDs per neuro: Keppra and Depakene  Paroxysmal sympathetic hyperactivity -On propranolol 80 every 8 and metoprolol -Neurontin every 8  Hypernatremia - resolved -Reduce FWF as needed - Will decrease free water to 200 Q8  Fever - unclear etiology Leukocytosis - WBC 12.3  -Completed 7 days of Zosyn -Blood cultures 1/13 negative ,1/21 pending -Tracheal aspirate 1/13 negative, 1/21 pending -Will restart cefipime 1/22  Anemia -Transfuse per protocol - Trend CBC  Hyperkalemia Plan - Lasix 40 x 1 on 1/21 - BMET in am  - Med review shows no obvious cause, ? TF  Tachycardia/hypotension due to paroxysmal sympathetic hyperactivity -continue propranolol and metoprolol -continue cardiac monitoring  Severe protein calorie malnutrition -Continue enteric feeds  SSI for hyperglycemia  GOC documentation previously DNR status Care Giver Distress + (anticiptory grief + upset at outcome +) Remains comatose. Patient's wife has been understandably in great distress over patient's clinical status. Patient's grandson who has autism is struggling greatly with his grandfather's absence. Appreciate Palliative team's input and assistance with management/GOC conversations. - DNR code status at present - Ongoing discussion re: patient and family's wishes in terms of tracheostomy, PEG, LTACH. Wife spoke with surgery 1/12.  - Spoke with wife 1/13. She expressed her  concerns with the entire stay starting at [facility]. >> See note from 1/13 for further information. But to summarize at this time wishes to proceed with trach/peg, no ready to "let him go"  1/16-had extensive conversation with spouse, Dr. Sherlynn Stalls also did have an extensive conversation with spouse-he remains DNR, continue full scope of care 1/17 - Perc  Trach and PEG   Best Practice (right click and "Reselect all SmartList Selections" daily)   Diet/type: tubefeeds DVT prophylaxis:  LMWH GI prophylaxis: PPI Foley:  External Foley in place  Code Status:  DNR Last date of multidisciplinary goals of care discussion: ongoing   The patient is critically ill with multiple organ systems failure and requires high complexity decision making for assessment and support, frequent evaluation and titration of therapies, application of advanced monitoring technologies and extensive interpretation of multiple databases.  Independent Critical Care Time: 35 Minutes.   Howard Pouch, MSN, [facility] 11/05/2022 7:31 AM   Please see Amion for pager number to reach on-call Pulmonary and Critical Care Team.

## 2022-11-06 ENCOUNTER — Inpatient Hospital Stay (HOSPITAL_COMMUNITY): Payer: Medicare Other

## 2022-11-06 DIAGNOSIS — J9601 Acute respiratory failure with hypoxia: Secondary | ICD-10-CM | POA: Diagnosis not present

## 2022-11-06 DIAGNOSIS — G931 Anoxic brain damage, not elsewhere classified: Secondary | ICD-10-CM

## 2022-11-06 DIAGNOSIS — Z7189 Other specified counseling: Secondary | ICD-10-CM

## 2022-11-06 LAB — CBC WITH DIFFERENTIAL/PLATELET
Abs Granulocyte: 6.7 10*3/uL — ABNORMAL HIGH (ref 1.5–6.5)
Abs Immature Granulocytes: 0 10*3/uL (ref 0.00–0.07)
Basophils Absolute: 0.1 10*3/uL (ref 0.0–0.1)
Basophils Relative: 0 %
Eosinophils Absolute: 0.6 10*3/uL — ABNORMAL HIGH (ref 0.0–0.5)
Eosinophils Relative: 5 %
HCT: 34 % — ABNORMAL LOW (ref 39.0–52.0)
Hemoglobin: 11 g/dL — ABNORMAL LOW (ref 13.0–17.0)
Immature Granulocytes: 0 %
Lymphocytes Relative: 14 %
Lymphs Abs: 1.8 10*3/uL (ref 0.7–4.0)
MCH: 28.4 pg (ref 26.0–34.0)
MCHC: 32.4 g/dL (ref 30.0–36.0)
MCV: 87.9 fL (ref 80.0–100.0)
Monocytes Absolute: 1.9 10*3/uL — ABNORMAL HIGH (ref 0.1–1.0)
Monocytes Relative: 15 %
Neutro Abs: 6.7 10*3/uL (ref 1.7–7.7)
Neutrophils Relative %: 66 %
Platelets: 220 10*3/uL (ref 150–400)
RBC: 3.87 MIL/uL — ABNORMAL LOW (ref 4.22–5.81)
RDW: 16.6 % — ABNORMAL HIGH (ref 11.5–15.5)
WBC: 12.5 10*3/uL — ABNORMAL HIGH (ref 4.0–10.5)
nRBC: 0 % (ref 0.0–0.2)

## 2022-11-06 LAB — BASIC METABOLIC PANEL
Anion gap: 11 (ref 5–15)
BUN: 46 mg/dL — ABNORMAL HIGH (ref 6–20)
CO2: 23 mmol/L (ref 22–32)
Calcium: 9.1 mg/dL (ref 8.9–10.3)
Chloride: 104 mmol/L (ref 98–111)
Creatinine, Ser: 0.78 mg/dL (ref 0.61–1.24)
GFR, Estimated: >60 mL/min (ref >60)
Glucose, Bld: 135 mg/dL — ABNORMAL HIGH (ref 70–99)
Potassium: 4.6 mmol/L (ref 3.5–5.1)
Sodium: 138 mmol/L (ref 135–145)

## 2022-11-06 LAB — CULTURE, RESPIRATORY W GRAM STAIN: Culture: NORMAL

## 2022-11-06 LAB — GLUCOSE, CAPILLARY
Glucose-Capillary: 145 mg/dL — ABNORMAL HIGH (ref 70–99)
Glucose-Capillary: 112 mg/dL — ABNORMAL HIGH (ref 70–99)
Glucose-Capillary: 171 mg/dL — ABNORMAL HIGH (ref 70–99)
Glucose-Capillary: 114 mg/dL — ABNORMAL HIGH (ref 70–99)
Glucose-Capillary: 137 mg/dL — ABNORMAL HIGH (ref 70–99)
Glucose-Capillary: 125 mg/dL — ABNORMAL HIGH (ref 70–99)

## 2022-11-06 LAB — MAGNESIUM: Magnesium: 2.3 mg/dL (ref 1.7–2.4)

## 2022-11-06 NOTE — Care Management Important Message (Signed)
Important Message  Patient Details  Name: Giuliano Preece MRN: [id number] Date of Birth: 04-Jun-1961   Medicare Important Message Given:  Yes     Shelda Altes 11/06/2022, 8:07 AM

## 2022-11-06 NOTE — Progress Notes (Signed)
NAME:  Damon [name], MRN:  [id number], DOB:  Mar 25, 1961, LOS: 15 ADMISSION DATE:  10/03/2022, CONSULTATION DATE:  10/03/2022 REFERRING MD:  Marylyn Ishihara - TRH CHIEF COMPLAINT:  Dyspnea   History of Present Illness:  62 year old man admitted to [facility] 12/20 in the setting of acute hypoxemic respiratory failure due to Flu A. PMHx significant for HTN, HLD, chronic back pain, tobacco abuse   Placed on BIPAP.  Coded (likely in the setting of hypoxia), initial rhythm PEA with CPR x 5 minutes, required intubation.  After several days on mechanical ventilation remains comatose, MRI Brain 12/26 worrisome for anoxic brain injury.  Transferred to [facility] for LTM EEG monitoring.  Neurology consulted. LTM with generalized epileptogenicity. Treated with Propofol, Depakote, Keppra, Ketamine. 12/31 Neurology with goals of care with family. Relayed that patient has a grim neurological recovery due to irreversible anoxic injury. Family wished to continue aggressive measures.   Palliative care consulted. Stay complicated with neuro-storming.   Surgical consult regarding PEG/trach  Pertinent Medical History:   Past Medical History:  Diagnosis Date   Elevated lipids    Hypertension    Tobacco abuse    Significant Hospital Events: Including procedures, antibiotic start and stop dates in addition to other pertinent events   12/18 Flu A positive 12/20 Admitted, 5 minute code after non-compliance with BiPAP MRSA PCR - neg Urine strep _POS 12/21 Intubated, paralyzed, ARDS protocol, on 7cc/kg 12/22 Weaned to 6 cc/kg with dyssynchrony, back on 7 cc/kg, driving pressures good, weaning vent 12/23 Attempt to wean sedation again with dyssynchrony and desaturation.  Some concern for cuff leak, tube exchange.  Cuff did not appear to be blown.  Ongoing signs of cuff leak, query leaking around cough, possible large trachea, diuresing 12/25 tolerating PSV, myoclonus> ceribell, Neuro consult, changed to propofol, diuresed  12/26  Changed to DNR. On Propofol, versed.  Tolerating PSV.  MRI brain with hypoxic/anoxic injury Blood culture neg 12/27 GPD's on EEG, pending transfer to [facility] for cEEG NEURO CONSULT 12/28 moved to [facility] for LTM EEG 12/31 family updated by neuro: no chance for meaningful recovery, they should make plans for comfort measures 1/1 cultures - resp normal flora 1/2 eeg still with epileptogenicity. Palli fam mtg -- family does not want to hear from medical teams unless there is "good news"  1/3  cEEG with burst suppression w highly epileptiform bursts.  1/4 cEEG with burst suppression  + highly epileptiform bursts still.  WBC slightly up to 22.5. Temp high 99 low 100  c-EEG stopped duie to lack of benefit Pallative care consult - wife upset about early dc from ER on 12/18 Chaplain consult: Wife is sufering from anticipatory grief + accepting god's way + curious/anger about events leading upto current state EEG: generalized epileptogenicity with high potential for seizures as well as profound diffuse encephalopathy. In the setting of cardiac arrest, this EEG pattern is suggestive of anoxic/hypoxic brain injury.  1/5 - Low grade fever +. [place] 22.5K.  on Zosyn. On vent fio2 30%. TF +. Per RN - diprivan stopped yesterday. No myoclonus today. Mild tachycardia + Rpt pall care on 10/21/22 per wife 1/6 - On vent FiO2 30%.  Tmax 99,42F ., WBC better. Still off diprivan -> No myoclonus. cXR visualized - devices in place and faiure clear Oral oxy and klonpin stopped 1/8 No significant changes in neuro status. Tachycardic to 130s, hypertensive to 140s. Coreg transitioned to metoprolol. 1/11 wife considering trach/peg, to discuss with surgery-discussed with Dr. Bobbye Morton 1/12 more tachycardic 1/16-spoke  with wife at length at bedside, Dr. Lindie Spruce also had a long conversation with spouse-desires to continue current aggressive care and desires PEG and trach placed 1/18 perc trach and peg 11/05/2022 transferred to floor due to need  for ICU bed  Interim History / Subjective:  Febrile 101.1 last 24 hours. Temp this am is 99.3 BC from 1/21 no growth to date Tracheal aspirate 1/21 pending>> GS with FEW GRAM NEGATIVE RODS  FEW WBC PRESENT, PREDOMINANTLY PMN   Objective:  Blood pressure 115/80, pulse (!) 118, temperature (!) 100.4 F (38 C), temperature source Axillary, resp. rate (!) 29, height 5\' 4"  (1.626 m), weight 52.4 kg, SpO2 92 %.    FiO2 (%):  [28 %] 28 %   Intake/Output Summary (Last 24 hours) at 11/06/2022 1032 Last data filed at 11/06/2022 11/08/2022 Gross per 24 hour  Intake 1860.1 ml  Output 1825 ml  Net 35.1 ml   Filed Weights   11/05/22 0500 11/05/22 2303 11/06/22 0432  Weight: 54.4 kg 52.4 kg 52.4 kg   Physical Exam: General: Frail male poorly responsive HEENT: MM pink/moist Neuro: Poorly responsible CV: Heart sounds are distant PULM: Diminished breath sounds  GI: soft, bsx4 active  Been extremities: warm/dry,  edema  Skin: no rashes or lesions   Resolved   Flu A AKI CAP, pneumococcal pneumonia  S/p Zosyn 12/31 - 1/7; Ceftriaxone 12/20- 12/27, Azithro 12/20, Flagyl 12/20 - 12/22  Assessment & Plan:   Respiratory failure Anoxic brain injury, ventilator dependent s/p perc trach 1/18 Acute respiratory failure with hypoxia/ARDS secondary to flu and strep pneumonia did complete course of antibiotics and Tamiflu Routine trach care Currently on trach collar Serial chest x-rays    Seizures Per neuro  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    Fever - unclear etiology Leukocytosis - WBC 12.3  Currently on cefepime  Anemia Recent Labs    11/05/22 0448 11/06/22 0534  HGB 10.5* 11.0*    Transfuse per protocol Hyperkalemia Recent Labs  Lab 11/05/22 0448 11/05/22 1420 11/06/22 0534  K 5.3* 4.4 4.6   Lab Results  Component Value Date   CREATININE 0.78 11/06/2022   CREATININE 0.59 (L) 11/05/2022   CREATININE 0.56 (L) 11/05/2022      Plan Resolved post Lasix  Tachycardia/hypotension due to paroxysmal sympathetic hyperactivity Continue propranolol and Metroprolol  Severe protein calorie malnutrition Continue tube feedings  SSI for hyperglycemia CBG (last 3)  Recent Labs    11/05/22 2311 11/06/22 0444 11/06/22 0902  GLUCAP 130* 145* 112*     GOC documentation previously DNR status Care Giver Distress + (anticiptory grief + upset at outcome +) Remains comatose. Patient's wife has been understandably in great distress over patient's clinical status. Patient's grandson who has autism is struggling greatly with his grandfather's absence. Appreciate Palliative team's input and assistance with management/GOC conversations. - DNR code status at present - Ongoing discussion re: patient and family's wishes in terms of tracheostomy, PEG, LTACH. Wife spoke with surgery 1/12.  - Spoke with wife 1/13. She expressed her concerns with the entire stay starting at [facility]. >> See note from 1/13 for further information. But to summarize at this time wishes to proceed with trach/peg, no ready to "let him go"  1/16-had extensive conversation with spouse, Dr. 10-27-1981 also did have an extensive conversation with spouse-he remains DNR, continue full scope of care 1/17 - Perc  Trach and PEG   Best Practice (right click and "Reselect all SmartList Selections" daily)   Diet/type:  tubefeeds DVT prophylaxis: LMWH GI prophylaxis: PPI Foley:  External Foley in place  Code Status:  DNR Last date of multidisciplinary goals of care discussion: ongoing  [place] Acute [facility] Nurse Practitioner [place] Please consult [place] 11/06/2022, 10:32 AM  Please see Amion for pager number to reach on-call Pulmonary and Critical Care Team.

## 2022-11-06 NOTE — Progress Notes (Addendum)
.km                                                                                                                                                                                                          Daily Progress Note   Patient Name: Damon [name]       Date: 11/06/2022 DOB: Apr 12, 1961  Age: 62 y.o. MRN#: [id number] Attending Physician: Lanier Clam, MD Primary Care Physician: [facility] Admit Date: 10/03/2022  Reason for Consultation/Follow-up: Establishing goals of care  Patient Profile/HPI:  62 y/o male admitted to [facility] on 12/20 in setting of acute hypoxemic respiratory failure due to Flu A. Placed on BIPAP. Had a 5 minute PEA arrest, required intubation. After several days on mechanical ventilation remains comatose, MRI brain findings worrisome for anoxic brain injury. Moved to [facility] for LTM EEG monitoring.  Palliative care has been asked to get involved for further goals of care conversations in the setting of anoxic brain injury post arrest. 12/31- no improvements, prognosis has been discussed extensively with family by palliative team, neurology and PCCM providers. 1/2- f/u meeting with patient's spouse [name]- she requested time and space as she grapples with processing eventual loss of spouse and how her life will be without him, she is not yet in a place to consider transition to comfort 1/7- propofol dc'd 1/5, no further myoclonus, no other sedating meds, discussed with spouse that decisions are needing to be made re: trach/PEG/LTC vs comfort 1/8 No significant changes in neuro status. Tachycardic to 130s, hypertensive to 140s. Coreg transitioned to metoprolol.   1/11-having some recurrent myoclonus ?neurostorming 1/15-wife requesting trach/PEG 1/23- trach/PEG in place, tolerating trach collar  Subjective: Chart reviewed including labs, progress notes, imaging from this and previous encounters.  No family at bedside. Off ventilator, tolerating trach  collar.   Review of Systems  Unable to perform ROS: Intubated   Physical Exam Vitals and nursing note reviewed.  Constitutional:      Comments: Frail, cachectic  Cardiovascular:     Rate and Rhythm: Normal rate.             Vital Signs: BP 115/80 (BP Location: Left Leg)   Pulse (!) 121   Temp (!) 101 F (38.3 C)   Resp (!) 35   Ht 5\' 4"  (1.626 m)   Wt 52.4 kg   SpO2 95%   BMI 19.83 kg/m  SpO2: SpO2: 95 % O2 Device: O2 Device: Tracheostomy Collar O2 Flow Rate: O2 Flow Rate (L/min): 6 L/min  Intake/output summary:  Intake/Output Summary (Last  24 hours) at 11/06/2022 1231 Last data filed at 11/06/2022 1610 Gross per 24 hour  Intake 1710.12 ml  Output 1825 ml  Net -114.88 ml    LBM: Last BM Date : 11/05/22 Baseline Weight: Weight: 64.9 kg Most recent weight: Weight: 52.4 kg       Palliative Assessment/Data: PPS: 10%      Patient Active Problem List   Diagnosis Date Noted   Advanced care planning/counseling discussion 10/21/2022   On mechanically assisted ventilation ([place]) 10/17/2022   Anoxic brain damage ([place]) 10/17/2022   Anoxic encephalopathy ([place]) 10/16/2022   Seizure ([place]) 10/16/2022   Pressure injury of skin 10/16/2022   Acute hypoxic respiratory failure ([place]) 10/03/2022   CAP (community acquired pneumonia) 10/03/2022   Influenza and pneumonia 10/03/2022   Elevated LFTs 10/03/2022   HTN (hypertension) 10/03/2022   HLD (hyperlipidemia) 10/03/2022   Tobacco abuse 10/03/2022   SIRS (systemic inflammatory response syndrome) ([place]) 10/03/2022   Influenza A 10/03/2022   Cardiac arrest ([place]) 10/03/2022   Tobacco dependence 10/03/2022    Palliative Care Assessment & Plan    Assessment/Recommendations/Plan  Full scope Tolerating trach collar Called Jennifer and left a message for followup PMT will chart check intermittently and followup with [name] if needed or if she calls- please call if f/u is needed sooner Addendum- received call from patient's  spouse- patient was moved out of ICU overnight to stepdown unit and she was not notified of the move or of the reason for the move, she is also requesting that patient be transferred to [facility]- I notified Dr. Silas Flood of her request- she would like him to call her  Code Status: DNR  Prognosis:  Unable to determine  Discharge Planning: To Be Determined  Thank you for allowing the Palliative Medicine Team to assist in the care of this patient.  Total time: 60 mins plus 15 min additional time Greater than 50%  of this time was spent counseling and coordinating care related to the above assessment and plan.  [name], AGNP-C Palliative Medicine   Please contact Palliative Medicine Team phone at [phone] for questions and concerns.

## 2022-11-06 NOTE — Plan of Care (Signed)
  Problem: Respiratory: Goal: Ability to maintain adequate ventilation will improve Outcome: Progressing   Problem: Respiratory: Goal: Ability to maintain a clear airway and adequate ventilation will improve Outcome: Progressing   Problem: Metabolic: Goal: Ability to maintain appropriate glucose levels will improve Outcome: Progressing   Problem: Skin Integrity: Goal: Risk for impaired skin integrity will decrease Outcome: Progressing   Problem: Tissue Perfusion: Goal: Adequacy of tissue perfusion will improve Outcome: Progressing   Problem: Clinical Measurements: Goal: Ability to maintain clinical measurements within normal limits will improve Outcome: Progressing   Problem: Nutrition: Goal: Adequate nutrition will be maintained Outcome: Progressing   Problem: Pain Managment: Goal: General experience of comfort will improve Outcome: Progressing   Problem: Safety: Goal: Ability to remain free from injury will improve Outcome: Progressing

## 2022-11-07 DIAGNOSIS — G931 Anoxic brain damage, not elsewhere classified: Secondary | ICD-10-CM | POA: Diagnosis not present

## 2022-11-07 DIAGNOSIS — Z7189 Other specified counseling: Secondary | ICD-10-CM | POA: Diagnosis not present

## 2022-11-07 DIAGNOSIS — J9601 Acute respiratory failure with hypoxia: Secondary | ICD-10-CM | POA: Diagnosis not present

## 2022-11-07 LAB — GLUCOSE, CAPILLARY
Glucose-Capillary: 113 mg/dL — ABNORMAL HIGH (ref 70–99)
Glucose-Capillary: 117 mg/dL — ABNORMAL HIGH (ref 70–99)
Glucose-Capillary: 130 mg/dL — ABNORMAL HIGH (ref 70–99)
Glucose-Capillary: 132 mg/dL — ABNORMAL HIGH (ref 70–99)
Glucose-Capillary: 132 mg/dL — ABNORMAL HIGH (ref 70–99)
Glucose-Capillary: 143 mg/dL — ABNORMAL HIGH (ref 70–99)

## 2022-11-07 MED ORDER — FREE WATER
150.0000 mL | Status: DC
  Administered 2022-11-07 – 2022-11-19 (×69): 150 mL

## 2022-11-07 MED ORDER — OSMOLITE 1.5 CAL PO LIQD
1000.0000 mL | ORAL | Status: DC
  Administered 2022-11-07 – 2022-12-13 (×41): 1000 mL
  Filled 2022-11-07 (×55): qty 1000

## 2022-11-07 NOTE — IPAL (Signed)
  Interdisciplinary Goals of Care Family Meeting   Date carried out: 11/07/2022  Location of the meeting: Bedside  Member's involved: Physician and Family Member or next of kin  Durable Power of Attorney or Loss adjuster, chartered: Wife present    Discussion: We discussed goals of care for Damon [name] .  Patient wife is very angry.  I think truly angry the entire situation, husband's illness.  But angry at other details about the state.  Most recently she was not informed when patient left the ICU overnight.  I expressed that I was truly sorry this happened and the communication should have been better.  She wants the patient's CODE STATUS changed to full code.  I expressed my recommendation not to do that.  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  She wants to "give it 90-day trial".  I asked why she was there were trial in 90 days.  After long discussion this is clearly an arbitrary number.  She admitted that she would likely continue to go on even if there is no improvement indefinitely.  I appreciated her honesty once she admitted this.  I again reiterated I disagree and recommend DNR given his poor neurologic status.  But I will change it to full code at her request.  She reports about a formal complaint about issues this hospitalization.  She states "no one contacted me."  Discussed with of care and in fact the [place] has spoken to the wife earlier this admission regarding her concerns.  Code status:   Code Status: Full Code   Disposition: Continue current acute care  Time spent for the meeting: 20 minutes    Lanier Clam, MD  11/07/2022, 11:33 AM

## 2022-11-07 NOTE — Progress Notes (Signed)
Nutrition Follow-up  DOCUMENTATION CODES:   Not applicable  INTERVENTION:  Tube feeding via G-tube: -Resume Osmolite 1.5 at 65 mL/hour (1560 mL goal daily volume) -Provide PROSource TF20 60 mL BID per tube -Provides: 2500 kcal, 137 grams of protein, 1185 mL H2O daily  Provide free water flush of 150 mL every 4 hours per tube. This will provide a total of 2085 mL H2O daily including water in tube feeds.  Continue Juven 1 packet per tube BID. Each packet provides 95 kcal, 7 grams L-Arginine, 7 grams L-Glutamine, 2.5 grams collagen protein, 300 mg vitamin C, 9.5 mg zinc, and other micronutrients essential for wound healing.  Continue Banatrol TF 60 mL BID  NUTRITION DIAGNOSIS:   Inadequate oral intake related to acute illness (respiratory failure/ventilated) as evidenced by NPO status.  Ongoing - addressing with tube feeds.  GOAL:   Patient will meet greater than or equal to 90% of their needs  Met with tube feed regimen.  MONITOR:   Vent status, Labs, Weight trends, TF tolerance  REASON FOR ASSESSMENT:   Consult Enteral/tube feeding initiation and management  ASSESSMENT:   Pt admitted from home with the flu leading to acute respiratory failure with hypoxia and PEA arrest upon admission. PMH significant for HTN, HLD, chronic back pain, tobacco use.  12/18: Flu A positive 12/20: admitted, 5 minute code after non-compliance with BiPAP 12/21: intubated, paralyzed, ARDS protocol 12/26: MRI brain with hypoxic/anoxic injury  12/28: transfer from [facility] to [facility] 4N 1/18: s/p tracheostomy tube placement and G-tube placement 1/21: tube feeds changed from Osmolite 1.5 to Vital 1.5 due to shortage of Osmolite 1.5 1/22: transfer from 4N to [place]; rectal tube removed; free water flush reduced to 200 mL every 8 hours  No family at bedside at time of RD assessment. Patient on trach collar 6 L/min FiO2 28%. Discussed with RN who reports pt has tolerated tube feeds and nutrition regimen well.  Noted pt has had paroxysmal sympathetic hyperactivity this admission. Suspect this is contributing to increased kcal needs. There is now adequate supply of Osmolite 1.5 to transition back to this formula. Spoke with patient's wife over the phone to update regarding nutrition plan of care. She had no nutrition questions at this time. Noted rectal tube was removed on 1/22.  Admission wt was 64.9 kg. Weights trended down this admission. Pt was 52.1 kg on 10/24/22 and 51.1 kg on 11/02/22. Calories were increased on 10/30/22. Most recent weight is 52.6 kg on 1/24.  Enteral Access: 24 Fr. G-tube placed 1/18  Tube feed regimen: Vital 1.5 at 65 mL/hour + PROSource TF20 60 mL BID + free water flush of 200 mL every 8 hours  Medications reviewed and include: famotidine, Banatrol TF 60 mL BID, gabapentin, Medihoney, Keppra, Juven BID, propranolol, valproic acid, cefepime  Labs reviewed: CBG 117-137. On 1/23 Sodium 138 (trending up from 135 on 1/22), BUN 46 (trending up from 37 on 1/22)  UOP: 2550 mL (2 mL/kg/hr)  I/O: +5802.4 mL since 10/24/22  Discussed with MD via secure chat. Okay to update free water flush to 150 mL every 4 hours.  Diet Order:   Diet Order             Diet NPO time specified  Diet effective ____                  EDUCATION NEEDS:   No education needs have been identified at this time  Skin:  Skin Assessment: Skin Integrity Issues: Skin Integrity  Issues:: Stage II, Unstageable Stage II: right upper ear (1 cm x 0.5 cm) Unstageable: sacrum (8.5 cm x 1 cm x 0.5cm)  Last BM:  11/06/22 - medium type 6  Height:   Ht Readings from Last 1 Encounters:  10/11/22 5\' 4"  (1.626 m)   Weight:   Wt Readings from Last 1 Encounters:  11/07/22 52.6 kg   BMI:  Body mass index is 19.91 kg/m.  Estimated Nutritional Needs:   Kcal:  2400-2600  Protein:  120-140 grams  Fluid:  >/= 2 L/day  Damon Drilling, MS, RD, LDN, CNSC Pager number available on Amion

## 2022-11-07 NOTE — Progress Notes (Addendum)
Daily Progress Note   Patient Name: Damon [name]       Date: 11/07/2022 DOB: March 22, 1961  Age: 62 y.o. MRN#: [id number] Attending Physician: Lanier Clam, MD Primary Care Physician: [facility] Admit Date: 10/03/2022  Reason for Consultation/Follow-up: Establishing goals of care  Patient Profile/HPI:  62 y/o male admitted to [facility] on 12/20 in setting of acute hypoxemic respiratory failure due to Flu A. Placed on BIPAP. Had a 5 minute PEA arrest, required intubation. After several days on mechanical ventilation remains comatose, MRI brain findings worrisome for anoxic brain injury. Moved to [facility] for LTM EEG monitoring.  Palliative care has been asked to get involved for further goals of care conversations in the setting of anoxic brain injury post arrest. 12/31- no improvements, prognosis has been discussed extensively with family by palliative team, neurology and PCCM providers. 1/2- f/u meeting with patient's spouse [name]- she requested time and space as she grapples with processing eventual loss of spouse and how her life will be without him, she is not yet in a place to consider transition to comfort 1/7- propofol dc'd 1/5, no further myoclonus, no other sedating meds, discussed with spouse that decisions are needing to be made re: trach/PEG/LTC vs comfort 1/8 No significant changes in neuro status. Tachycardic to 130s, hypertensive to 140s. Coreg transitioned to metoprolol.   1/11-having some recurrent myoclonus ?neurostorming 1/15-wife requesting trach/PEG 1/23- trach/PEG in place, tolerating trach collar  Subjective: Chart reviewed including labs, progress notes, imaging from this and previous encounters.  I was called to bedside by nursing per spouse's request. On my  arrival spouse in discussion with Dr. Silas Flood.  Spouse has requested change in code status to Full code.  After Dr. Silas Flood left [name] continued in expressing her anger re: not being called regarding transfer. She continued to complain that she did not trust the hospital and requested patient to be transferred. I reviewed the transfer process and told her the attending MD would need to make the request.  [name] continued to angrily express her displeasure in patient's care saying, "they just want to keep him her so they can finish him off". She then again began to recount her multiple grievances she has had throughout this hospitalization.  I asked her if there were any questions she had that I could answer, or what I could do to help her with her anger, she replied there was nothing that could be done.   Review of Systems  Unable to perform ROS: Intubated   Physical Exam Vitals and nursing note reviewed.  Constitutional:      Comments: Frail, cachectic  Cardiovascular:     Rate and Rhythm: Normal rate.             Vital Signs: BP 134/82 (BP Location: Left Leg)   Pulse (!) 102   Temp 98.3 F (36.8 C) (Axillary)   Resp (!) 28   Ht 5\' 4"  (1.626 m)   Wt 52.6 kg   SpO2 100%   BMI 19.91 kg/m  SpO2: SpO2: 100 % O2 Device: O2 Device: Tracheostomy Collar O2 Flow Rate: O2 Flow Rate (L/min): (S) 6 L/min  Intake/output summary:  Intake/Output Summary (Last 24 hours) at  11/07/2022 1119 Last data filed at 11/07/2022 0600 Gross per 24 hour  Intake 1876.92 ml  Output 2550 ml  Net -673.08 ml    LBM: Last BM Date : 11/07/22 Baseline Weight: Weight: 64.9 kg Most recent weight: Weight: 52.6 kg       Palliative Assessment/Data: PPS: 10%      Patient Active Problem List   Diagnosis Date Noted   Advanced care planning/counseling discussion 10/21/2022   On mechanically assisted ventilation ([place]) 10/17/2022   Anoxic brain damage ([place]) 10/17/2022   Anoxic encephalopathy ([place])  10/16/2022   Seizure ([place]) 10/16/2022   Pressure injury of skin 10/16/2022   Acute hypoxic respiratory failure ([place]) 10/03/2022   CAP (community acquired pneumonia) 10/03/2022   Influenza and pneumonia 10/03/2022   Elevated LFTs 10/03/2022   HTN (hypertension) 10/03/2022   HLD (hyperlipidemia) 10/03/2022   Tobacco abuse 10/03/2022   SIRS (systemic inflammatory response syndrome) ([place]) 10/03/2022   Influenza A 10/03/2022   Cardiac arrest ([place]) 10/03/2022   Tobacco dependence 10/03/2022    Palliative Care Assessment & Plan    Assessment/Recommendations/Plan  Full scope/Full code Tolerating trach collar PMT will chart check intermittently, goals have been clarified for full scope/full code- will see patient/spouse if called  Code Status: Full code  Prognosis:  Unable to determine  Discharge Planning: To Be Determined  Thank you for allowing the Palliative Medicine Team to assist in the care of this patient.  Total time: 50 minutes Greater than 50%  of this time was spent counseling and coordinating care related to the above assessment and plan.  [name], AGNP-C Palliative Medicine   Please contact Palliative Medicine Team phone at [phone] for questions and concerns.

## 2022-11-07 NOTE — Progress Notes (Signed)
NAME:  Legend Tumminello, MRN:  [id number], DOB:  [birth year], LOS: 35 ADMISSION DATE:  10/03/2022, CONSULTATION DATE:  10/03/2022 REFERRING MD:  Ronaldo Miyamoto - TR[facility] CHIEF COMPLAINT:  Dyspnea   History of Present Illness:  62 year old man admitted to [facility] 12/20 in the setting of acute hypoxemic respiratory failure due to Flu A. PMHx significant for HTN, HLD, chronic back pain, tobacco abuse   Placed on BIPAP.  Coded (likely in the setting of hypoxia), initial rhythm PEA with CPR x 5 minutes, required intubation.  After several days on mechanical ventilation remains comatose, MRI Brain 12/26 worrisome for anoxic brain injury.  Transferred to [facility] for LTM EEG monitoring.  Neurology consulted. LTM with generalized epileptogenicity. Treated with Propofol, Depakote, Keppra, Ketamine. 12/31 Neurology with goals of care with family. Relayed that patient has a grim neurological recovery due to irreversible anoxic injury. Family wished to continue aggressive measures.   Palliative care consulted. Stay complicated with neuro-storming.   Surgical consult regarding PEG/trach  Pertinent Medical History:   Past Medical History:  Diagnosis Date   Elevated lipids    Hypertension    Tobacco abuse    Significant Hospital Events: Including procedures, antibiotic start and stop dates in addition to other pertinent events   12/18 Flu A positive 12/20 Admitted, 5 minute code after non-compliance with BiPAP MRSA PCR - neg Urine strep _POS 12/21 Intubated, paralyzed, ARDS protocol, on 7cc/kg 12/22 Weaned to 6 cc/kg with dyssynchrony, back on 7 cc/kg, driving pressures good, weaning vent 12/23 Attempt to wean sedation again with dyssynchrony and desaturation.  Some concern for cuff leak, tube exchange.  Cuff did not appear to be blown.  Ongoing signs of cuff leak, query leaking around cough, possible large trachea, diuresing 12/25 tolerating PSV, myoclonus> ceribell, Neuro consult, changed to propofol, diuresed  12/26  Changed to DNR. On Propofol, versed.  Tolerating PSV.  MRI brain with hypoxic/anoxic injury Blood culture neg 12/27 GPD's on EEG, pending transfer to [facility] for cEEG NEURO CONSULT 12/28 moved to [facility] for LTM EEG 12/31 family updated by neuro: no chance for meaningful recovery, they should make plans for comfort measures 1/1 cultures - resp normal flora 1/2 eeg still with epileptogenicity. Palli fam mtg -- family does not want to hear from medical teams unless there is "good news"  1/3  cEEG with burst suppression w highly epileptiform bursts.  1/4 cEEG with burst suppression  + highly epileptiform bursts still.  WBC slightly up to 22.5. Temp high 99 low 100  c-EEG stopped duie to lack of benefit Pallative care consult - wife upset about early dc from ER on 12/18 Chaplain consult: Wife is sufering from anticipatory grief + accepting god's way + curious/anger about events leading upto current state EEG: generalized epileptogenicity with high potential for seizures as well as profound diffuse encephalopathy. In the setting of cardiac arrest, this EEG pattern is suggestive of anoxic/hypoxic brain injury.  1/5 - Low grade fever +. WEBC 22.5K.  on Zosyn. On vent fio2 30%. TF +. Per RN - diprivan stopped yesterday. No myoclonus today. Mild tachycardia + Rpt pall care on 10/21/22 per wife 1/6 - On vent FiO2 30%.  Tmax 99,43F ., WBC better. Still off diprivan -> No myoclonus. cXR visualized - devices in place and faiure clear Oral oxy and klonpin stopped 1/8 No significant changes in neuro status. Tachycardic to 130s, hypertensive to 140s. Coreg transitioned to metoprolol. 1/11 wife considering trach/peg, to discuss with surgery-discussed with Dr. Bedelia Person 1/12 more tachycardic 1/16-spoke  with wife at length at bedside, Dr. Hulen Skains also had a long conversation with spouse-desires to continue current aggressive care and desires PEG and trach placed 1/18 perc trach and peg 11/05/2022 transferred to floor due to need  for ICU bed 11/08/2022 transition to [facility] hospitalist service with pulmonary following weekly for trach  Interim History / Subjective:  Febrile 101.1 last 24 hours. Temp this am is 99.3 BC from 1/21 no growth to date Tracheal aspirate 1/21 pending>> GS with FEW GRAM NEGATIVE RODS  FEW WBC PRESENT, PREDOMINANTLY PMN   Objective:  Blood pressure 134/82, pulse (!) 102, temperature 98.3 F (36.8 C), temperature source Axillary, resp. rate (!) 28, height 5\' 4"  (1.626 m), weight 52.6 kg, SpO2 100 %.    FiO2 (%):  [28 %-30 %] 28 %   Intake/Output Summary (Last 24 hours) at 11/07/2022 1019 Last data filed at 11/07/2022 0600 Gross per 24 hour  Intake 1876.92 ml  Output 2550 ml  Net -673.08 ml   Filed Weights   11/05/22 2303 11/06/22 0432 11/07/22 0303  Weight: 52.4 kg 52.4 kg 52.6 kg   Physical Exam: General: 62 year old male poorly responsive HEENT: MM pink/moist tracheostomy is clean and dry remains on trach collar Neuro: Poorly responsive CV: Heart sounds are regular PULM: Diminished in the bases   GI: soft, bsx4 active tube feedings GU: Extremities: warm/dry, Amber 1+ edema  Skin: no rashes or lesions    Resolved   Flu A AKI CAP, pneumococcal pneumonia  S/p Zosyn 12/31 - 1/7; Ceftriaxone 12/20- 12/27, Azithro 12/20, Flagyl 12/20 - 12/22  Assessment & Plan:   Respiratory failure Anoxic brain injury, ventilator dependent s/p perc trach 1/18 Acute respiratory failure with hypoxia/ARDS secondary to flu and strep pneumonia did complete course of continue trach collar Supportive care Weanable asked [facility] to assume his care 11/08/2022 with pulmonary followed once a week for trach care    Seizures Per neuro  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    Fever - unclear etiology Leukocytosis - WBC 12.3  Currently on cefepime  Anemia Recent Labs    11/05/22 0448 11/06/22 0534  HGB 10.5* 11.0*    Transfuse per  protocol Hyperkalemia Recent Labs  Lab 11/05/22 0448 11/05/22 1420 11/06/22 0534  K 5.3* 4.4 4.6   Lab Results  Component Value Date   CREATININE 0.78 11/06/2022   CREATININE 0.59 (L) 11/05/2022   CREATININE 0.56 (L) 11/05/2022     Plan Resolved  Tachycardia/hypotension due to paroxysmal sympathetic hyperactivity Continue propranolol and metoprolol  Severe protein calorie malnutrition Tube feedings  SSI for hyperglycemia CBG (last 3)  Recent Labs    11/06/22 2019 11/06/22 2329 11/07/22 0426  GLUCAP 137* 125* 117*  Sliding-scale insulin protocol   GOC documentation previously DNR status Care Giver Distress + (anticiptory grief + upset at outcome +) Remains comatose. Patient's wife has been understandably in great distress over patient's clinical status. Patient's grandson who has autism is struggling greatly with his grandfather's absence. Appreciate Palliative team's input and assistance with management/GOC conversations. - DNR code status at present - Ongoing discussion re: patient and family's wishes in terms of tracheostomy, PEG, LTACH. Wife spoke with surgery 1/12.  - Spoke with wife 1/13. She expressed her concerns with the entire stay starting at [facility]. >> See note from 1/13 for further information. But to summarize at this time wishes to proceed with trach/peg, no ready to "let him go"  1/16-had extensive conversation with spouse, Dr. Sherlynn Stalls also did have  an extensive conversation with spouse-he remains DNR, continue full scope of care 1/17 - Perc  Trach and PEG   Best Practice (right click and "Reselect all SmartList Selections" daily)   Diet/type: tubefeeds DVT prophylaxis: LMWH GI prophylaxis: PPI Foley:  External Foley in place  Code Status:  DNR Last date of multidisciplinary goals of care discussion: ongoing  [place] Acute [facility] Nurse Practitioner [place] Please consult Amion 11/07/2022, 10:19 AM  Please  see Amion for pager number to reach on-call Pulmonary and Critical Care Team.

## 2022-11-08 ENCOUNTER — Inpatient Hospital Stay (HOSPITAL_COMMUNITY): Payer: Medicare Other

## 2022-11-08 DIAGNOSIS — J9601 Acute respiratory failure with hypoxia: Secondary | ICD-10-CM | POA: Diagnosis not present

## 2022-11-08 LAB — CBC
HCT: 29.3 % — ABNORMAL LOW (ref 39.0–52.0)
Hemoglobin: 9.5 g/dL — ABNORMAL LOW (ref 13.0–17.0)
MCH: 28.7 pg (ref 26.0–34.0)
MCHC: 32.4 g/dL (ref 30.0–36.0)
MCV: 88.5 fL (ref 80.0–100.0)
Platelets: 244 10*3/uL (ref 150–400)
RBC: 3.31 MIL/uL — ABNORMAL LOW (ref 4.22–5.81)
RDW: 17.2 % — ABNORMAL HIGH (ref 11.5–15.5)
WBC: 19.4 10*3/uL — ABNORMAL HIGH (ref 4.0–10.5)
nRBC: 0.4 % — ABNORMAL HIGH (ref 0.0–0.2)

## 2022-11-08 LAB — GLUCOSE, CAPILLARY
Glucose-Capillary: 130 mg/dL — ABNORMAL HIGH (ref 70–99)
Glucose-Capillary: 130 mg/dL — ABNORMAL HIGH (ref 70–99)
Glucose-Capillary: 129 mg/dL — ABNORMAL HIGH (ref 70–99)
Glucose-Capillary: 120 mg/dL — ABNORMAL HIGH (ref 70–99)
Glucose-Capillary: 129 mg/dL — ABNORMAL HIGH (ref 70–99)

## 2022-11-08 LAB — BASIC METABOLIC PANEL
Anion gap: 9 (ref 5–15)
BUN: 43 mg/dL — ABNORMAL HIGH (ref 6–20)
CO2: 23 mmol/L (ref 22–32)
Calcium: 8.7 mg/dL — ABNORMAL LOW (ref 8.9–10.3)
Chloride: 106 mmol/L (ref 98–111)
Creatinine, Ser: 0.57 mg/dL — ABNORMAL LOW (ref 0.61–1.24)
GFR, Estimated: >60 mL/min (ref >60)
Glucose, Bld: 143 mg/dL — ABNORMAL HIGH (ref 70–99)
Potassium: 4.4 mmol/L (ref 3.5–5.1)
Sodium: 138 mmol/L (ref 135–145)

## 2022-11-08 NOTE — Progress Notes (Signed)
PROGRESS NOTE    Damon [name]  UUV:[id number] DOB: 1961-07-27 DOA: 10/03/2022 PCP: [facility]  Chief Complaint  Patient presents with   Influenza    Brief Narrative:   62 year old man admitted to [facility] 12/20 in the setting of acute hypoxemic respiratory failure due to Flu [name]. PMHx significant for HTN, HLD, chronic back pain, tobacco abuse    Placed on BIPAP.  Coded (likely in the setting of hypoxia), initial rhythm PEA with CPR x 5 minutes, required intubation.  After several days on mechanical ventilation remains comatose, MRI Brain 12/26 worrisome for anoxic brain injury.  Transferred to [facility] for LTM EEG monitoring.   Neurology consulted. LTM with generalized epileptogenicity. Treated with Propofol, Depakote, Keppra, Ketamine. 12/31 Neurology with goals of care with family. Relayed that patient has [name] grim neurological recovery due to irreversible anoxic injury. Family wished to continue aggressive measures.    Palliative care consulted. Stay complicated with neuro-storming.    Surgical consult regarding PEG/trach  Significant Events 12/18 Flu Nerine Pulse positive 12/20 Admitted, bipap, 5 minute code MRSA PCR - neg Urine strep _POS 12/21 Intubated, paralyzed, ARDS protocol, on 7cc/kg 12/22 Weaned to 6 cc/kg with dyssynchrony, back on 7 cc/kg, driving pressures good, weaning vent 12/23 Attempt to wean sedation again with dyssynchrony and desaturation.  Some concern for cuff leak, tube exchange.  Cuff did not appear to be blown.  Ongoing signs of cuff leak, query leaking around cough, possible large trachea, diuresing 12/25 tolerating PSV, myoclonus> ceribell, Neuro consult, changed to propofol, diuresed  12/26 Changed to DNR. On Propofol, versed.  Tolerating PSV.  MRI brain with hypoxic/anoxic injury Blood culture neg 12/27 GPD's on EEG, pending transfer to [facility] for cEEG NEURO CONSULT 12/28 moved to [facility] for LTM EEG 12/31 family updated by neuro: no chance for meaningful recovery,  they should make plans for comfort measures 1/1 cultures - resp normal flora 1/2 eeg still with epileptogenicity. Palli fam mtg -- family does not want to hear from medical teams unless there is "good news"  1/3  cEEG with burst suppression w highly epileptiform bursts.  1/4 cEEG with burst suppression  + highly epileptiform bursts still.  WBC slightly up to 22.5. Temp high 99 low 100  c-EEG stopped duie to lack of benefit Pallative care consult - wife upset about early dc from ER on 12/18 Chaplain consult: Wife is sufering from anticipatory grief + accepting god's way + curious/anger about events leading upto current state EEG: generalized epileptogenicity with high potential for seizures as well as profound diffuse encephalopathy. In the setting of cardiac arrest, this EEG pattern is suggestive of anoxic/hypoxic brain injury.  1/5 - Low grade fever +. [name].  on Zosyn. On vent fio2 30%. TF +. Per RN - diprivan stopped yesterday. No myoclonus today. Mild tachycardia + Rpt pall care on 10/21/22 per wife 1/6 - On vent FiO2 30%.  Tmax 99,76F ., WBC better. Still off diprivan -> No myoclonus. cXR visualized - devices in place and faiure clear Oral oxy and klonpin stopped 1/8 No significant changes in neuro status. Tachycardic to 130s, hypertensive to 140s. Coreg transitioned to metoprolol. 1/11 wife considering trach/peg, to discuss with surgery-discussed with Dr. Bobbye Morton 1/12 more tachycardic 1/16-spoke with wife at length at bedside, Dr. Hulen Skains also had Rema Lievanos long conversation with spouse-desires to continue current aggressive care and desires PEG and trach placed 1/18 perc trach and peg 11/05/2022 transferred to floor due to need for ICU bed 11/08/2022 transition to [facility] hospitalist  service with pulmonary following weekly for trach   Assessment & Plan:   Principal Problem:   Acute hypoxic respiratory failure (HCC) Active Problems:   CAP (community acquired pneumonia)   Influenza and pneumonia    Elevated LFTs   HTN (hypertension)   HLD (hyperlipidemia)   Tobacco abuse   SIRS (systemic inflammatory response syndrome) (HCC)   Influenza [name]   Cardiac arrest (HCC)   Tobacco dependence   Anoxic encephalopathy (HCC)   Seizure (HCC)   Pressure injury of skin   On mechanically assisted ventilation (HCC)   Anoxic brain damage (HCC)   Advanced care planning/counseling discussion  Goals of Care See IPAL from 1/24.  He had been DNR, but transitioned back to full code yesterday.  Seems there have been frustrations in general regarding Damon [name] overall course.  Will continue to follow and discuss with family going forward.  Acute Hypoxic Respiratory Failure  ARDS PEA Arrest secondary to flu (influenza Damon [name] positive 12/18) and strep pneumonia (12/20).  12/26 resp culture with rare normal resp flora.   S/p tamiflu 12/21-25.  Ceftriaxone 12/20 - 12/26. Currently trach dependent, PCCM to follow once weekly for trach care   Anoxic brain injury Myoclonic Seizure activity  Neurology discussed with family on 12/31.  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  Signed off 12/31. Keppra, depakene  Recurrent Fevers  Leukocytosis Intermittent fevers throughout hospital stay 1/1, 1/9, 1/13, 1/21 with normal respiratory flora. Blood cultures 1/21 with no growth.  Blood culture 1/13 with no growth.  12/26 blood culture with no growth.  Had coag negative staph at presentation, suspect that was contaminant. CXR today with low lung volumes without evidence of acute cardiopulmonary disease Zosyn 12/31-1/6.  Cefepime 1/22-present. Leukocytosis worsening today.  If recurrent fevers consider repeat blood cultures, additional workup.  Could consider additional CT imaging, but unlikely to change his overall coarse given above.  Will continue to follow and evaluate as indicated.   Paroxysmal sympathetic hyperactivity Continue  propranolol and neurontin   Tachycardia/hypotension due to paroxysmal sympathetic hyperactivity Continue propranolol and metoprolol prn   Severe protein calorie malnutrition Tube feedings   Prediabetes SSI for hyperglycemia     DVT prophylaxis: lovenox Code Status: full Family Communication: none, will call wife Disposition:   Status is: Inpatient Remains inpatient appropriate because: continued need for inpatient care   Consultants:  PCCM Palliative General surgery neurology  Procedures:  See significant events above  Antimicrobials:  Anti-infectives (From admission, onward)    Start     Dose/Rate Route Frequency Ordered Stop   11/05/22 0900  ceFEPIme (MAXIPIME) 2 g in sodium chloride 0.9 % 100 mL IVPB        2 g 200 mL/hr over 30 Minutes Intravenous Every 8 hours 11/05/22 0804     11/01/22 1215  ceFAZolin (ANCEF) IVPB 2g/100 mL premix        2 g 200 mL/hr over 30 Minutes Intravenous  Once 11/01/22 0851 11/01/22 1232   10/14/22 1000  piperacillin-tazobactam (ZOSYN) IVPB 3.375 g        3.375 g 12.5 mL/hr over 240 Minutes Intravenous Every 8 hours 10/14/22 0951 10/21/22 0556   10/04/22 2200  cefTRIAXone (ROCEPHIN) 2 g in sodium chloride 0.9 % 100 mL IVPB  Status:  Discontinued        2 g 200 mL/hr over 30 Minutes Intravenous Daily at bedtime 10/04/22 0914 10/10/22 0900   10/04/22 1045  oseltamivir (TAMIFLU)  6 MG/ML suspension 75 mg        75 mg Per Tube 2 times daily 10/04/22 0950 10/08/22 2229   10/04/22 0600  cefTRIAXone (ROCEPHIN) 2 g in sodium chloride 0.9 % 100 mL IVPB  Status:  Discontinued        2 g 200 mL/hr over 30 Minutes Intravenous Every 24 hours 10/03/22 1054 10/03/22 1250   10/04/22 0600  azithromycin (ZITHROMAX) 500 mg in sodium chloride 0.9 % 250 mL IVPB  Status:  Discontinued        500 mg 250 mL/hr over 60 Minutes Intravenous Every 24 hours 10/03/22 1054 10/03/22 1250   10/03/22 2200  cefTRIAXone (ROCEPHIN) 1 g in sodium chloride 0.9 % 100 mL  IVPB  Status:  Discontinued        1 g 200 mL/hr over 30 Minutes Intravenous Daily at bedtime 10/03/22 1559 10/04/22 0914   10/03/22 2200  metroNIDAZOLE (FLAGYL) IVPB 500 mg  Status:  Discontinued        500 mg 100 mL/hr over 60 Minutes Intravenous Every 12 hours 10/03/22 1559 10/05/22 0812   10/03/22 1400  piperacillin-tazobactam (ZOSYN) IVPB 3.375 g  Status:  Discontinued        3.375 g 12.5 mL/hr over 240 Minutes Intravenous Every 8 hours 10/03/22 1258 10/03/22 1559   10/03/22 0515  cefTRIAXone (ROCEPHIN) 1 g in sodium chloride 0.9 % 100 mL IVPB        1 g 200 mL/hr over 30 Minutes Intravenous  Once 10/03/22 0503 10/03/22 0732   10/03/22 0515  azithromycin (ZITHROMAX) 500 mg in sodium chloride 0.9 % 250 mL IVPB        500 mg 250 mL/hr over 60 Minutes Intravenous  Once 10/03/22 0503 10/03/22 0732       Subjective: unresponsive  Objective: Vitals:   11/08/22 1137 11/08/22 1205 11/08/22 1526 11/08/22 1550  BP: 108/60  130/78 123/84  Pulse: 95 99 93 95  Resp: 19 (!) 22 (!) 24 (!) 23  Temp: 99 F (37.2 C)   98.3 F (36.8 C)  TempSrc: Axillary   Axillary  SpO2: 100% 90% 100% 100%  Weight:      Height:        Intake/Output Summary (Last 24 hours) at 11/08/2022 1808 Last data filed at 11/08/2022 1752 Gross per 24 hour  Intake 2232.64 ml  Output 2550 ml  Net -317.36 ml   Filed Weights   11/06/22 0432 11/07/22 0303 11/08/22 0424  Weight: 52.4 kg 52.6 kg 52.6 kg    Examination:  General exam: Appears calm and comfortable  Respiratory system: unlabored Cardiovascular system: RRR Gastrointestinal system: Abdomen is nondistended, soft and nontender - peg Central nervous system: comatose, PERRL, not withdrawing to pressure on nailbeds to extremities - no meaningful interaction Extremities: no LEE   Data Reviewed: I have personally reviewed following labs and imaging studies  CBC: Recent Labs  Lab 11/02/22 0453 11/03/22 0813 11/04/22 0514 11/05/22 0448  11/06/22 0534 11/08/22 0745  WBC 17.1* 13.6* 10.2 12.3* 12.5* 19.4*  NEUTROABS 13.1*  --   --   --  6.7  --   HGB 9.9* 9.6* 8.7* 10.5* 11.0* 9.5*  HCT 31.4* 30.9* 28.6* 33.5* 34.0* 29.3*  MCV 91.0 93.4 94.1 91.3 87.9 88.5  PLT 206 193 178 180 220 409    Basic Metabolic Panel: Recent Labs  Lab 11/04/22 0514 11/05/22 0448 11/05/22 1420 11/06/22 0534 11/08/22 0745  NA 137 135 137 138 138  K 4.7 5.3* 4.4  4.6 4.4  CL 106 102 101 104 106  CO2 21* 25 26 23 23   GLUCOSE 118* 98 146* 135* 143*  BUN 37* 37* 46* 46* 43*  CREATININE 0.61 0.56* 0.59* 0.78 0.57*  CALCIUM 8.5* 8.7* 9.1 9.1 8.7*  MG  --   --   --  2.3  --     GFR: Estimated Creatinine Clearance: 73.1 mL/min (Adali Pennings) (by C-G formula based on SCr of 0.57 mg/dL (L)).  Liver Function Tests: Recent Labs  Lab 11/05/22 0448  AST 56*  ALT 108*  ALKPHOS 103  BILITOT 0.5  PROT 6.9  ALBUMIN 2.0*    CBG: Recent Labs  Lab 11/07/22 2345 11/08/22 0420 11/08/22 0736 11/08/22 1136 11/08/22 1548  GLUCAP 113* 130* 130* 129* 120*     Recent Results (from the past 240 hour(s))  Surgical PCR screen     Status: None   Collection Time: 10/31/22  1:53 AM   Specimen: Nasal Mucosa; Nasal Swab  Result Value Ref Range Status   MRSA, PCR NEGATIVE NEGATIVE Final   Staphylococcus aureus NEGATIVE NEGATIVE Final    Comment: (NOTE) The Xpert SA Assay (FDA approved for NASAL specimens in patients 46 years of age and older), is one component of Refoel Palladino comprehensive surveillance program. It is not intended to diagnose infection nor to guide or monitor treatment. Performed at [facility], [address], [place], [place]   Culture, Respiratory w Gram Stain     Status: None   Collection Time: 11/04/22  7:46 AM   Specimen: Tracheal Aspirate; Respiratory  Result Value Ref Range Status   Specimen Description TRACHEAL ASPIRATE  Final   Special Requests NONE  Final   Gram Stain   Final    FEW GRAM NEGATIVE RODS FEW WBC  PRESENT, PREDOMINANTLY PMN    Culture   Final    FEW Normal respiratory flora-no Staph aureus or Pseudomonas seen Performed at [facility], [address] [place], [place]    Report Status 11/06/2022 FINAL  Final  Culture, blood (Routine X 2) w Reflex to ID Panel     Status: None (Preliminary result)   Collection Time: 11/04/22  8:00 AM   Specimen: BLOOD  Result Value Ref Range Status   Specimen Description BLOOD RIGHT ANTECUBITAL  Final   Special Requests   Final    BOTTLES DRAWN AEROBIC AND ANAEROBIC Blood Culture adequate volume   Culture   Final    NO GROWTH 4 DAYS Performed at [facility], [address], [place], [place]    Report Status PENDING  Incomplete  Culture, blood (Routine X 2) w Reflex to ID Panel     Status: None (Preliminary result)   Collection Time: 11/04/22  8:07 AM   Specimen: BLOOD  Result Value Ref Range Status   Specimen Description BLOOD RIGHT ANTECUBITAL  Final   Special Requests   Final    BOTTLES DRAWN AEROBIC AND ANAEROBIC Blood Culture adequate volume   Culture   Final    NO GROWTH 4 DAYS Performed at [facility], [address] [place], [place]    Report Status PENDING  Incomplete         Radiology Studies: DG CHEST PORT 1 VIEW  Result Date: 11/08/2022 CLINICAL DATA:  Fever. EXAM: PORTABLE CHEST 1 VIEW COMPARISON:  11/06/2022. FINDINGS: 0941 hours. Midline tracheostomy in unchanged position. Partially visualized gastrostomy in the left upper quadrant. Low lung  volumes accentuate the pulmonary vasculature and cardiomediastinal silhouette. No focal airspace opacity. No pleural effusion or pneumothorax. IMPRESSION: Low lung volumes without evidence of acute cardiopulmonary disease. Electronically Signed   By: Emmit Alexanders M.D.   On: 11/08/2022 10:01        Scheduled Meds:  enoxaparin (LOVENOX) injection  40 mg Subcutaneous Q24H   famotidine  20 mg Per Tube QHS   feeding supplement  (PROSource TF20)  60 mL Per Tube BID   fiber supplement (BANATROL TF)  60 mL Per Tube BID   free water  150 mL Per Tube Q4H   gabapentin  100 mg Per Tube Q8H   leptospermum manuka honey  1 Application Topical QHS   levETIRAcetam  1,500 mg Per Tube BID   nutrition supplement (JUVEN)  1 packet Per Tube BID BM   mouth rinse  15 mL Mouth Rinse Q2H   propranolol  80 mg Per Tube Q8H   valproic acid  500 mg Per Tube Q8H   Continuous Infusions:  sodium chloride Stopped (11/05/22 2230)   ceFEPime (MAXIPIME) IV 200 mL/hr at 11/08/22 1752   feeding supplement (OSMOLITE 1.5 CAL) 65 mL/hr at 11/08/22 1752     LOS: 36 days    Time spent: over 30 min    Fayrene Helper, MD [facility]   To contact the attending provider between 7A-7P or the covering provider during after hours 7P-7A, please log into the web site [email or web address] and access using universal [place] password for that web site. If you do not have the password, please call the hospital operator.  11/08/2022, 6:08 PM

## 2022-11-08 NOTE — Plan of Care (Signed)
  Problem: Clinical Measurements: Goal: Ability to maintain clinical measurements within normal limits will improve Outcome: Progressing   Problem: Clinical Measurements: Goal: Diagnostic test results will improve Outcome: Progressing   Problem: Clinical Measurements: Goal: Respiratory complications will improve Outcome: Progressing   Problem: Clinical Measurements: Goal: Cardiovascular complication will be avoided Outcome: Progressing   Problem: Elimination: Goal: Will not experience complications related to bowel motility Outcome: Progressing   Problem: Elimination: Goal: Will not experience complications related to urinary retention Outcome: Progressing   Problem: Pain Managment: Goal: General experience of comfort will improve Outcome: Progressing   Problem: Safety: Goal: Ability to remain free from injury will improve Outcome: Progressing   Problem: Skin Integrity: Goal: Risk for impaired skin integrity will decrease Outcome: Progressing

## 2022-11-09 DIAGNOSIS — J9601 Acute respiratory failure with hypoxia: Secondary | ICD-10-CM | POA: Diagnosis not present

## 2022-11-09 LAB — CULTURE, BLOOD (ROUTINE X 2)
Culture: NO GROWTH
Culture: NO GROWTH
Special Requests: ADEQUATE
Special Requests: ADEQUATE

## 2022-11-09 LAB — CBC WITH DIFFERENTIAL/PLATELET
Abs Immature Granulocytes: 0 10*3/uL (ref 0.00–0.07)
Basophils Absolute: 0.2 10*3/uL — ABNORMAL HIGH (ref 0.0–0.1)
Basophils Relative: 1 %
Eosinophils Absolute: 0.2 10*3/uL (ref 0.0–0.5)
Eosinophils Relative: 1 %
HCT: 29.1 % — ABNORMAL LOW (ref 39.0–52.0)
Hemoglobin: 9.5 g/dL — ABNORMAL LOW (ref 13.0–17.0)
Lymphocytes Relative: 8 %
Lymphs Abs: 1.6 10*3/uL (ref 0.7–4.0)
MCH: 28.9 pg (ref 26.0–34.0)
MCHC: 32.6 g/dL (ref 30.0–36.0)
MCV: 88.4 fL (ref 80.0–100.0)
Monocytes Absolute: 2.5 10*3/uL — ABNORMAL HIGH (ref 0.1–1.0)
Monocytes Relative: 13 %
Neutro Abs: 15.1 10*3/uL — ABNORMAL HIGH (ref 1.7–7.7)
Neutrophils Relative %: 77 %
Platelets: 251 10*3/uL (ref 150–400)
RBC: 3.29 MIL/uL — ABNORMAL LOW (ref 4.22–5.81)
RDW: 17.4 % — ABNORMAL HIGH (ref 11.5–15.5)
WBC: 19.6 10*3/uL — ABNORMAL HIGH (ref 4.0–10.5)
nRBC: 0.4 % — ABNORMAL HIGH (ref 0.0–0.2)
nRBC: 1 /100 WBC — ABNORMAL HIGH

## 2022-11-09 LAB — GLUCOSE, CAPILLARY
Glucose-Capillary: 113 mg/dL — ABNORMAL HIGH (ref 70–99)
Glucose-Capillary: 120 mg/dL — ABNORMAL HIGH (ref 70–99)
Glucose-Capillary: 136 mg/dL — ABNORMAL HIGH (ref 70–99)
Glucose-Capillary: 138 mg/dL — ABNORMAL HIGH (ref 70–99)
Glucose-Capillary: 157 mg/dL — ABNORMAL HIGH (ref 70–99)
Glucose-Capillary: 99 mg/dL (ref 70–99)

## 2022-11-09 LAB — SEDIMENTATION RATE: Sed Rate: 145 mm/hr — ABNORMAL HIGH (ref 0–16)

## 2022-11-09 LAB — COMPREHENSIVE METABOLIC PANEL
ALT: 118 U/L — ABNORMAL HIGH (ref 0–44)
AST: 62 U/L — ABNORMAL HIGH (ref 15–41)
Albumin: 1.9 g/dL — ABNORMAL LOW (ref 3.5–5.0)
Alkaline Phosphatase: 102 U/L (ref 38–126)
Anion gap: 7 (ref 5–15)
BUN: 43 mg/dL — ABNORMAL HIGH (ref 6–20)
CO2: 25 mmol/L (ref 22–32)
Calcium: 8.9 mg/dL (ref 8.9–10.3)
Chloride: 106 mmol/L (ref 98–111)
Creatinine, Ser: 0.53 mg/dL — ABNORMAL LOW (ref 0.61–1.24)
GFR, Estimated: >60 mL/min (ref >60)
Glucose, Bld: 126 mg/dL — ABNORMAL HIGH (ref 70–99)
Potassium: 4.5 mmol/L (ref 3.5–5.1)
Sodium: 138 mmol/L (ref 135–145)
Total Bilirubin: 0.3 mg/dL (ref 0.3–1.2)
Total Protein: 6.8 g/dL (ref 6.5–8.1)

## 2022-11-09 LAB — C-REACTIVE PROTEIN: CRP: 4.4 mg/dL — ABNORMAL HIGH (ref <1.0)

## 2022-11-09 MED ORDER — GUAIFENESIN ER 600 MG PO TB12: 600.0000 mg | ORAL_TABLET | Freq: Two times a day (BID) | ORAL | Status: DC

## 2022-11-09 MED ORDER — GUAIFENESIN 200 MG PO TABS
400.0000 mg | ORAL_TABLET | Freq: Three times a day (TID) | ORAL | Status: DC
  Administered 2022-11-09 – 2023-01-22 (×224): 400 mg
  Filled 2022-11-09 (×230): qty 2

## 2022-11-09 NOTE — Progress Notes (Signed)
TRH night cross cover note:   I was notified by RN that the patient is having some increased secretions of the tracheostomy.  RT has been performing routine deep suctioning via the patient's tracheostomy this evening, and I confirmed with patient's RN that the supplemental oxygen that he is receiving is humidified, will noting no associated increase in supplemental oxygen demands overnight.  Per my discussions with inpatient pharmacist this evening, will pursue guaifenesin 200 mg crushed and given per PEG tube every 8 hours for mucolytic benefits.     Babs Bertin, DO Hospitalist

## 2022-11-09 NOTE — Progress Notes (Signed)
PROGRESS NOTE    Damon [name]  UUV:[id number] DOB: 1961-07-27 DOA: 10/03/2022 PCP: [facility]  Chief Complaint  Patient presents with   Influenza    Brief Narrative:   62 year old man admitted to [facility] 12/20 in the setting of acute hypoxemic respiratory failure due to Flu Damon [name]. PMHx significant for HTN, HLD, chronic back pain, tobacco abuse    Placed on BIPAP.  Coded (likely in the setting of hypoxia), initial rhythm PEA with CPR x 5 minutes, required intubation.  After several days on mechanical ventilation remains comatose, MRI Brain 12/26 worrisome for anoxic brain injury.  Transferred to [facility] for LTM EEG monitoring.   Neurology consulted. LTM with generalized epileptogenicity. Treated with Propofol, Depakote, Keppra, Ketamine. 12/31 Neurology with goals of care with family. Relayed that patient has Damon [name] grim neurological recovery due to irreversible anoxic injury. Family wished to continue aggressive measures.    Palliative care consulted. Stay complicated with neuro-storming.    Surgical consult regarding PEG/trach  Significant Events 12/18 Flu Damon [name] positive 12/20 Admitted, bipap, 5 minute code MRSA PCR - neg Urine strep _POS 12/21 Intubated, paralyzed, ARDS protocol, on 7cc/kg 12/22 Weaned to 6 cc/kg with dyssynchrony, back on 7 cc/kg, driving pressures good, weaning vent 12/23 Attempt to wean sedation again with dyssynchrony and desaturation.  Some concern for cuff leak, tube exchange.  Cuff did not appear to be blown.  Ongoing signs of cuff leak, query leaking around cough, possible large trachea, diuresing 12/25 tolerating PSV, myoclonus> ceribell, Neuro consult, changed to propofol, diuresed  12/26 Changed to DNR. On Propofol, versed.  Tolerating PSV.  MRI brain with hypoxic/anoxic injury Blood culture neg 12/27 GPD's on EEG, pending transfer to [facility] for cEEG NEURO CONSULT 12/28 moved to [facility] for LTM EEG 12/31 family updated by neuro: no chance for meaningful recovery,  they should make plans for comfort measures 1/1 cultures - resp normal flora 1/2 eeg still with epileptogenicity. Palli fam mtg -- family does not want to hear from medical teams unless there is "good news"  1/3  cEEG with burst suppression w highly epileptiform bursts.  1/4 cEEG with burst suppression  + highly epileptiform bursts still.  WBC slightly up to 22.5. Temp high 99 low 100  c-EEG stopped duie to lack of benefit Pallative care consult - wife upset about early dc from ER on 12/18 Chaplain consult: Wife is sufering from anticipatory grief + accepting god's way + curious/anger about events leading upto current state EEG: generalized epileptogenicity with high potential for seizures as well as profound diffuse encephalopathy. In the setting of cardiac arrest, this EEG pattern is suggestive of anoxic/hypoxic brain injury.  1/5 - Low grade fever +. Damon [name] 22.5K.  on Zosyn. On vent fio2 30%. TF +. Per RN - diprivan stopped yesterday. No myoclonus today. Mild tachycardia + Rpt pall care on 10/21/22 per wife 1/6 - On vent FiO2 30%.  Tmax 99,76F ., WBC better. Still off diprivan -> No myoclonus. cXR visualized - devices in place and faiure clear Oral oxy and klonpin stopped 1/8 No significant changes in neuro status. Tachycardic to 130s, hypertensive to 140s. Coreg transitioned to metoprolol. 1/11 wife considering trach/peg, to discuss with surgery-discussed with Dr. Bobbye Morton 1/12 more tachycardic 1/16-spoke with wife at length at bedside, Dr. Hulen Skains also had Damon [name] long conversation with spouse-desires to continue current aggressive care and desires PEG and trach placed 1/18 perc trach and peg 11/05/2022 transferred to floor due to need for ICU bed 11/08/2022 transition to [facility]  service with pulmonary following weekly for trach   Assessment & Plan:   Principal Problem:   Acute hypoxic respiratory failure ([place]) Active Problems:   CAP (community acquired pneumonia)   Influenza and pneumonia    Elevated LFTs   HTN (hypertension)   HLD (hyperlipidemia)   Tobacco abuse   SIRS (systemic inflammatory response syndrome) (HCC)   Influenza Lauramae [name]   Cardiac arrest (HCC)   Tobacco dependence   Anoxic encephalopathy (HCC)   Seizure (HCC)   Pressure injury of skin   On mechanically assisted ventilation ([place])   Anoxic brain damage ([place])   Advanced care planning/counseling discussion  Goals of Care See IPAL from 1/24.  He had been DNR, but transitioned back to full code.  Seems there have been frustrations in general regarding Mr. [name] overall course.  Will continue to follow and discuss with family going forward.  Wife asking about transfer to [facility].  Acute Hypoxic Respiratory Failure  ARDS PEA Arrest secondary to flu (influenza Damon [name] positive 12/18) and strep pneumonia (12/20).  12/26 resp culture with rare normal resp flora.   S/p tamiflu 12/21-25.  Ceftriaxone 12/20 - 12/26. Currently trach dependent, PCCM to follow once weekly for trach care  Anoxic brain injury Myoclonic Seizure activity  Neurology discussed with family on 12/31.  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  Signed off 12/31. Keppra, depakene  Recurrent Fevers  Leukocytosis Intermittent fevers throughout hospital stay - last 1/25 1/1, 1/9, 1/13, 1/21 with normal respiratory flora. Blood cultures 1/21 with no growth.  Blood culture 1/13 with no growth.  12/26 blood culture with no growth.  Had coag negative staph at presentation, suspect that was contaminant. CXR today with low lung volumes without evidence of acute cardiopulmonary disease Zosyn 12/31-1/6.  Cefepime 1/22-present.  Plan for 7 days. Leukocytosis remain elevated today.  If recurrent fevers consider repeat blood cultures, additional workup.  Could consider additional CT imaging, but unlikely to change his overall coarse given above.  Will continue to follow and evaluate as  indicated.   Paroxysmal sympathetic hyperactivity Continue propranolol and neurontin   Tachycardia/hypotension due to paroxysmal sympathetic hyperactivity Continue propranolol and metoprolol prn   Severe protein calorie malnutrition Tube feedings   Prediabetes SSI for hyperglycemia     DVT prophylaxis: lovenox Code Status: full Family Communication: wife 1/26 Disposition:   Status is: Inpatient Remains inpatient appropriate because: continued need for inpatient care   Consultants:  PCCM Palliative General surgery neurology  Procedures:  See significant events above  Antimicrobials:  Anti-infectives (From admission, onward)    Start     Dose/Rate Route Frequency Ordered Stop   11/05/22 0900  ceFEPIme (MAXIPIME) 2 g in sodium chloride 0.9 % 100 mL IVPB        2 g 200 mL/hr over 30 Minutes Intravenous Every 8 hours 11/05/22 0804     11/01/22 1215  ceFAZolin (ANCEF) IVPB 2g/100 mL premix        2 g 200 mL/hr over 30 Minutes Intravenous  Once 11/01/22 0851 11/01/22 1232   10/14/22 1000  piperacillin-tazobactam (ZOSYN) IVPB 3.375 g        3.375 g 12.5 mL/hr over 240 Minutes Intravenous Every 8 hours 10/14/22 0951 10/21/22 0556   10/04/22 2200  cefTRIAXone (ROCEPHIN) 2 g in sodium chloride 0.9 % 100 mL IVPB  Status:  Discontinued        2 g 200 mL/hr over 30 Minutes Intravenous Daily at  bedtime 10/04/22 0914 10/10/22 0900   10/04/22 1045  oseltamivir (TAMIFLU) 6 MG/ML suspension 75 mg        75 mg Per Tube 2 times daily 10/04/22 0950 10/08/22 2229   10/04/22 0600  cefTRIAXone (ROCEPHIN) 2 g in sodium chloride 0.9 % 100 mL IVPB  Status:  Discontinued        2 g 200 mL/hr over 30 Minutes Intravenous Every 24 hours 10/03/22 1054 10/03/22 1250   10/04/22 0600  azithromycin (ZITHROMAX) 500 mg in sodium chloride 0.9 % 250 mL IVPB  Status:  Discontinued        500 mg 250 mL/hr over 60 Minutes Intravenous Every 24 hours 10/03/22 1054 10/03/22 1250   10/03/22 2200   cefTRIAXone (ROCEPHIN) 1 g in sodium chloride 0.9 % 100 mL IVPB  Status:  Discontinued        1 g 200 mL/hr over 30 Minutes Intravenous Daily at bedtime 10/03/22 1559 10/04/22 0914   10/03/22 2200  metroNIDAZOLE (FLAGYL) IVPB 500 mg  Status:  Discontinued        500 mg 100 mL/hr over 60 Minutes Intravenous Every 12 hours 10/03/22 1559 10/05/22 0812   10/03/22 1400  piperacillin-tazobactam (ZOSYN) IVPB 3.375 g  Status:  Discontinued        3.375 g 12.5 mL/hr over 240 Minutes Intravenous Every 8 hours 10/03/22 1258 10/03/22 1559   10/03/22 0515  cefTRIAXone (ROCEPHIN) 1 g in sodium chloride 0.9 % 100 mL IVPB        1 g 200 mL/hr over 30 Minutes Intravenous  Once 10/03/22 0503 10/03/22 0732   10/03/22 0515  azithromycin (ZITHROMAX) 500 mg in sodium chloride 0.9 % 250 mL IVPB        500 mg 250 mL/hr over 60 Minutes Intravenous  Once 10/03/22 0503 10/03/22 0732       Subjective: unresponsive  Objective: Vitals:   11/09/22 0924 11/09/22 1121 11/09/22 1142 11/09/22 1628  BP:  122/79  130/83  Pulse: 100 (!) 102  93  Resp: 20 20  (!) 24  Temp:  98.8 F (37.1 C)  99.4 F (37.4 C)  TempSrc:  Axillary  Oral  SpO2: 100% 98% 98% 100%  Weight:      Height:        Intake/Output Summary (Last 24 hours) at 11/09/2022 1729 Last data filed at 11/09/2022 1438 Gross per 24 hour  Intake 1140.47 ml  Output 2950 ml  Net -1809.53 ml   Filed Weights   11/07/22 0303 11/08/22 0424 11/09/22 0326  Weight: 52.6 kg 52.6 kg 58.1 kg    Examination:  General: comatose Cardiovascular: sinus tachy Lungs: trach, unlabored Abdomen: Soft, nontender, nondistended Neurological: comatose, no meaningful movement or interaction Extremities: no LEE   Data Reviewed: I have personally reviewed following labs and imaging studies  CBC: Recent Labs  Lab 11/04/22 0514 11/05/22 0448 11/06/22 0534 11/08/22 0745 11/09/22 0330  WBC 10.2 12.3* 12.5* 19.4* 19.6*  NEUTROABS  --   --  6.7  --  15.1*  HGB  8.7* 10.5* 11.0* 9.5* 9.5*  HCT 28.6* 33.5* 34.0* 29.3* 29.1*  MCV 94.1 91.3 87.9 88.5 88.4  PLT 178 180 220 244 251    Basic Metabolic Panel: Recent Labs  Lab 11/05/22 0448 11/05/22 1420 11/06/22 0534 11/08/22 0745 11/09/22 0330  NA 135 137 138 138 138  K 5.3* 4.4 4.6 4.4 4.5  CL 102 101 104 106 106  CO2 25 26 23 23 25   GLUCOSE 98 146*  135* 143* 126*  BUN 37* 46* 46* 43* 43*  CREATININE 0.56* 0.59* 0.78 0.57* 0.53*  CALCIUM 8.7* 9.1 9.1 8.7* 8.9  MG  --   --  2.3  --   --     GFR: Estimated Creatinine Clearance: 80.7 mL/min (Carianna Lague) (by C-G formula based on SCr of 0.53 mg/dL (L)).  Liver Function Tests: Recent Labs  Lab 11/05/22 0448 11/09/22 0330  AST 56* 62*  ALT 108* 118*  ALKPHOS 103 102  BILITOT 0.5 0.3  PROT 6.9 6.8  ALBUMIN 2.0* 1.9*    CBG: Recent Labs  Lab 11/08/22 2347 11/09/22 0323 11/09/22 0802 11/09/22 1129 11/09/22 1627  GLUCAP 99 120* 136* 157* 138*     Recent Results (from the past 240 hour(s))  Surgical PCR screen     Status: None   Collection Time: 10/31/22  1:53 AM   Specimen: Nasal Mucosa; Nasal Swab  Result Value Ref Range Status   MRSA, PCR NEGATIVE NEGATIVE Final   Staphylococcus aureus NEGATIVE NEGATIVE Final    Comment: (NOTE) The Xpert SA Assay (FDA approved for NASAL specimens in patients 25 years of age and older), is one component of [place] comprehensive surveillance program. It is not intended to diagnose infection nor to guide or monitor treatment. Performed at [facility], [place] [address] [place], [place] [address]   Culture, Respiratory w Gram Stain     Status: None   Collection Time: 11/04/22  7:46 AM   Specimen: Tracheal Aspirate; Respiratory  Result Value Ref Range Status   Specimen Description TRACHEAL ASPIRATE  Final   Special Requests NONE  Final   Gram Stain   Final    FEW GRAM NEGATIVE RODS FEW WBC PRESENT, PREDOMINANTLY PMN    Culture   Final    FEW Normal respiratory flora-no Staph aureus or  Pseudomonas seen Performed at [facility], [address], [place], [place] [address]    Report Status 11/06/2022 FINAL  Final  Culture, blood (Routine X 2) w Reflex to ID Panel     Status: None   Collection Time: 11/04/22  8:00 AM   Specimen: BLOOD  Result Value Ref Range Status   Specimen Description BLOOD RIGHT ANTECUBITAL  Final   Special Requests   Final    BOTTLES DRAWN AEROBIC AND ANAEROBIC Blood Culture adequate volume   Culture   Final    NO GROWTH 5 DAYS Performed at [facility], [place] [address], [place], [place] [address]    Report Status 11/09/2022 FINAL  Final  Culture, blood (Routine X 2) w Reflex to ID Panel     Status: None   Collection Time: 11/04/22  8:07 AM   Specimen: BLOOD  Result Value Ref Range Status   Specimen Description BLOOD RIGHT ANTECUBITAL  Final   Special Requests   Final    BOTTLES DRAWN AEROBIC AND ANAEROBIC Blood Culture adequate volume   Culture   Final    NO GROWTH 5 DAYS Performed at [facility], [place] [address], [place], [place] [address]    Report Status 11/09/2022 FINAL  Final         Radiology Studies: DG CHEST PORT 1 VIEW  Result Date: 11/08/2022 CLINICAL DATA:  Fever. EXAM: PORTABLE CHEST 1 VIEW COMPARISON:  11/06/2022. FINDINGS: 0941 hours. Midline tracheostomy in unchanged position. Partially visualized gastrostomy in the left upper quadrant. Low lung volumes accentuate the pulmonary vasculature and cardiomediastinal silhouette. No focal airspace opacity. No pleural  effusion or pneumothorax. IMPRESSION: Low lung volumes without evidence of acute cardiopulmonary disease. Electronically Signed   By: Orvan Falconer M.D.   On: 11/08/2022 10:01        Scheduled Meds:  enoxaparin (LOVENOX) injection  40 mg Subcutaneous Q24H   famotidine  20 mg Per Tube QHS   feeding supplement (PROSource TF20)  60 mL Per Tube BID   fiber supplement (BANATROL TF)  60 mL Per Tube BID   free water  150 mL Per Tube Q4H    gabapentin  100 mg Per Tube Q8H   guaiFENesin  400 mg Per Tube Q8H   leptospermum manuka honey  1 Application Topical QHS   levETIRAcetam  1,500 mg Per Tube BID   nutrition supplement (JUVEN)  1 packet Per Tube BID BM   mouth rinse  15 mL Mouth Rinse Q2H   propranolol  80 mg Per Tube Q8H   valproic acid  500 mg Per Tube Q8H   Continuous Infusions:  sodium chloride Stopped (11/05/22 2230)   ceFEPime (MAXIPIME) IV 2 g (11/09/22 1654)   feeding supplement (OSMOLITE 1.5 CAL) 1,000 mL (11/09/22 0518)     LOS: 37 days    Time spent: over 30 min    Lacretia Nicks, MD [facility]   To contact the attending provider between 7A-7P or the covering provider during after hours 7P-7A, please log into the web site [email or web address] and access using universal [place] password for that web site. If you do not have the password, please call the hospital operator.  11/09/2022, 5:29 PM

## 2022-11-10 DIAGNOSIS — Z7189 Other specified counseling: Secondary | ICD-10-CM | POA: Diagnosis not present

## 2022-11-10 DIAGNOSIS — G931 Anoxic brain damage, not elsewhere classified: Secondary | ICD-10-CM | POA: Diagnosis not present

## 2022-11-10 DIAGNOSIS — J9601 Acute respiratory failure with hypoxia: Secondary | ICD-10-CM | POA: Diagnosis not present

## 2022-11-10 DIAGNOSIS — Z515 Encounter for palliative care: Secondary | ICD-10-CM | POA: Diagnosis not present

## 2022-11-10 LAB — COMPREHENSIVE METABOLIC PANEL
ALT: 120 U/L — ABNORMAL HIGH (ref 0–44)
AST: 57 U/L — ABNORMAL HIGH (ref 15–41)
Albumin: 2 g/dL — ABNORMAL LOW (ref 3.5–5.0)
Alkaline Phosphatase: 100 U/L (ref 38–126)
Anion gap: 7 (ref 5–15)
BUN: 45 mg/dL — ABNORMAL HIGH (ref 6–20)
CO2: 24 mmol/L (ref 22–32)
Calcium: 8.9 mg/dL (ref 8.9–10.3)
Chloride: 106 mmol/L (ref 98–111)
Creatinine, Ser: 0.54 mg/dL — ABNORMAL LOW (ref 0.61–1.24)
GFR, Estimated: >60 mL/min (ref >60)
Glucose, Bld: 129 mg/dL — ABNORMAL HIGH (ref 70–99)
Potassium: 4.4 mmol/L (ref 3.5–5.1)
Sodium: 137 mmol/L (ref 135–145)
Total Bilirubin: 0.2 mg/dL — ABNORMAL LOW (ref 0.3–1.2)
Total Protein: 7.1 g/dL (ref 6.5–8.1)

## 2022-11-10 LAB — CBC WITH DIFFERENTIAL/PLATELET
Abs Immature Granulocytes: 4.3 10*3/uL — ABNORMAL HIGH (ref 0.00–0.07)
Band Neutrophils: 1 %
Basophils Absolute: 0.2 10*3/uL — ABNORMAL HIGH (ref 0.0–0.1)
Basophils Relative: 1 %
Eosinophils Absolute: 0.2 10*3/uL (ref 0.0–0.5)
Eosinophils Relative: 1 %
HCT: 32 % — ABNORMAL LOW (ref 39.0–52.0)
Hemoglobin: 10 g/dL — ABNORMAL LOW (ref 13.0–17.0)
Lymphocytes Relative: 10 %
Lymphs Abs: 1.9 10*3/uL (ref 0.7–4.0)
MCH: 29.2 pg (ref 26.0–34.0)
MCHC: 31.3 g/dL (ref 30.0–36.0)
MCV: 93.6 fL (ref 80.0–100.0)
Metamyelocytes Relative: 10 %
Monocytes Absolute: 1.2 10*3/uL — ABNORMAL HIGH (ref 0.1–1.0)
Monocytes Relative: 6 %
Myelocytes: 10 %
Neutro Abs: 11.6 10*3/uL — ABNORMAL HIGH (ref 1.7–7.7)
Neutrophils Relative %: 59 %
Platelets: 248 10*3/uL (ref 150–400)
Promyelocytes Relative: 2 %
RBC: 3.42 MIL/uL — ABNORMAL LOW (ref 4.22–5.81)
RDW: 17.4 % — ABNORMAL HIGH (ref 11.5–15.5)
WBC: 19.4 10*3/uL — ABNORMAL HIGH (ref 4.0–10.5)
nRBC: 0.4 % — ABNORMAL HIGH (ref 0.0–0.2)
nRBC: 1 /100 WBC — ABNORMAL HIGH

## 2022-11-10 LAB — GLUCOSE, CAPILLARY
Glucose-Capillary: 108 mg/dL — ABNORMAL HIGH (ref 70–99)
Glucose-Capillary: 125 mg/dL — ABNORMAL HIGH (ref 70–99)
Glucose-Capillary: 90 mg/dL (ref 70–99)
Glucose-Capillary: 121 mg/dL — ABNORMAL HIGH (ref 70–99)
Glucose-Capillary: 125 mg/dL — ABNORMAL HIGH (ref 70–99)
Glucose-Capillary: 116 mg/dL — ABNORMAL HIGH (ref 70–99)

## 2022-11-10 LAB — PHOSPHORUS: Phosphorus: 3.1 mg/dL (ref 2.5–4.6)

## 2022-11-10 LAB — MAGNESIUM: Magnesium: 2.3 mg/dL (ref 1.7–2.4)

## 2022-11-10 NOTE — Progress Notes (Signed)
Daily Progress Note   Patient Name: Damon [name]       Date: 11/10/2022 DOB: Jan 03, 1961  Age: 62 y.o. MRN#: [id number] Attending Physician: Elodia Florence., * Primary Care Physician: [facility] Admit Date: 10/03/2022  Reason for Consultation/Follow-up: Establishing goals of care  Patient Profile/HPI:  62 y/o male admitted to [facility] on 12/20 in setting of acute hypoxemic respiratory failure due to Flu A. Placed on BIPAP. Had a 5 minute PEA arrest, required intubation. After several days on mechanical ventilation remains comatose, MRI brain findings worrisome for anoxic brain injury. Moved to [facility] for LTM EEG monitoring.  Palliative care has been asked to get involved for further goals of care conversations in the setting of anoxic brain injury post arrest. 12/31- no improvements, prognosis has been discussed extensively with family by palliative team, neurology and PCCM providers. 1/2- f/u meeting with patient's spouse Damon [name]- she requested time and space as she grapples with processing eventual loss of spouse and how her life will be without him, she is not yet in a place to consider transition to comfort 1/7- propofol dc'd 1/5, no further myoclonus, no other sedating meds, discussed with spouse that decisions are needing to be made re: trach/PEG/LTC vs comfort 1/8 No significant changes in neuro status. Tachycardic to 130s, hypertensive to 140s. Coreg transitioned to metoprolol.   1/11-having some recurrent myoclonus ?neurostorming 1/15-wife requesting trach/PEG 1/23- trach/PEG in place, tolerating trach collar  Subjective: Chart reviewed including labs, progress notes, imaging from this and previous encounters.  Evaluated patient- no changes.  Spoke with spouse Damon [name] by  phone. She asked about patient transfer to [facility]. She plans to discuss with attending MD today. We also discussed that she can call and request a hospital to consider accepting a patient transfer.  We discussed her grief and anger. I encouraged her to seek counsel from her spiritual leader. She is also calling on Monday to set up an appointment with a counselor.   Review of Systems  Unable to perform ROS: Intubated   Physical Exam Vitals and nursing note reviewed.  Constitutional:      Comments: Frail, cachectic  Cardiovascular:     Rate and Rhythm: Normal rate.             Vital Signs: BP 126/82 (BP Location: Left Leg)   Pulse (!) 106   Temp 98.2 F (36.8 C) (Axillary)   Resp (!) 25   Ht 5\' 4"  (1.626 m)   Wt 57.6 kg   SpO2 100%   BMI 21.80 kg/m  SpO2: SpO2: 100 % O2 Device: O2 Device: Tracheostomy Collar O2 Flow Rate: O2 Flow Rate (L/min): 6 L/min  Intake/output summary:  Intake/Output Summary (Last 24 hours) at 11/10/2022 1345 Last data filed at 11/10/2022 1033 Gross per 24 hour  Intake --  Output 2850 ml  Net -2850 ml    LBM: Last BM Date : 11/07/22 Baseline Weight: Weight: 64.9 kg Most recent weight: Weight: 57.6 kg       Palliative Assessment/Data: PPS: 10%      Patient Active Problem List   Diagnosis Date Noted   Advanced care planning/counseling discussion 10/21/2022   On mechanically assisted ventilation ([place]) 10/17/2022  Anoxic brain damage ([place]) 10/17/2022   Anoxic encephalopathy ([place]) 10/16/2022   Seizure ([place]) 10/16/2022   Pressure injury of skin 10/16/2022   Acute hypoxic respiratory failure ([place]) 10/03/2022   CAP (community acquired pneumonia) 10/03/2022   Influenza and pneumonia 10/03/2022   Elevated LFTs 10/03/2022   HTN (hypertension) 10/03/2022   HLD (hyperlipidemia) 10/03/2022   Tobacco abuse 10/03/2022   SIRS (systemic inflammatory response syndrome) ([place]) 10/03/2022   Influenza A 10/03/2022   Cardiac arrest ([place]) 10/03/2022    Tobacco dependence 10/03/2022    Palliative Care Assessment & Plan    Assessment/Recommendations/Plan  Full scope/Full code Tolerating trach collar PMT will chart check intermittently, goals have been clarified for full scope/full code- will see patient/spouse if called  Code Status: Full code  Prognosis:  Unable to determine  Discharge Planning: To Be Determined  Thank you for allowing the Palliative Medicine Team to assist in the care of this patient.  Total time: 60 minutes Greater than 50%  of this time was spent counseling and coordinating care related to the above assessment and plan.  [name], AGNP-C Palliative Medicine   Please contact Palliative Medicine Team phone at [phone] for questions and concerns.

## 2022-11-10 NOTE — Progress Notes (Signed)
PROGRESS NOTE    Damon [name]  UUV:[id number] DOB: 1961-07-27 DOA: 10/03/2022 PCP: [facility]  Chief Complaint  Patient presents with   Influenza    Brief Narrative:   62 year old man admitted to [facility] 12/20 in the setting of acute hypoxemic respiratory failure due to Flu Damon [name]. PMHx significant for HTN, HLD, chronic back pain, tobacco abuse    Placed on BIPAP.  Coded (likely in the setting of hypoxia), initial rhythm PEA with CPR x 5 minutes, required intubation.  After several days on mechanical ventilation remains comatose, MRI Brain 12/26 worrisome for anoxic brain injury.  Transferred to [facility] for LTM EEG monitoring.   Neurology consulted. LTM with generalized epileptogenicity. Treated with Propofol, Depakote, Keppra, Ketamine. 12/31 Neurology with goals of care with family. Relayed that patient has Damon [name] grim neurological recovery due to irreversible anoxic injury. Family wished to continue aggressive measures.    Palliative care consulted. Stay complicated with neuro-storming.    Surgical consult regarding PEG/trach  Significant Events 12/18 Flu [name] positive 12/20 Admitted, bipap, 5 minute code MRSA PCR - neg Urine strep _POS 12/21 Intubated, paralyzed, ARDS protocol, on 7cc/kg 12/22 Weaned to 6 cc/kg with dyssynchrony, back on 7 cc/kg, driving pressures good, weaning vent 12/23 Attempt to wean sedation again with dyssynchrony and desaturation.  Some concern for cuff leak, tube exchange.  Cuff did not appear to be blown.  Ongoing signs of cuff leak, query leaking around cough, possible large trachea, diuresing 12/25 tolerating PSV, myoclonus> ceribell, Neuro consult, changed to propofol, diuresed  12/26 Changed to DNR. On Propofol, versed.  Tolerating PSV.  MRI brain with hypoxic/anoxic injury Blood culture neg 12/27 GPD's on EEG, pending transfer to [facility] for cEEG NEURO CONSULT 12/28 moved to [facility] for LTM EEG 12/31 family updated by neuro: no chance for meaningful recovery,  they should make plans for comfort measures 1/1 cultures - resp normal flora 1/2 eeg still with epileptogenicity. Palli fam mtg -- family does not want to hear from medical teams unless there is "good news"  1/3  cEEG with burst suppression w highly epileptiform bursts.  1/4 cEEG with burst suppression  + highly epileptiform bursts still.  WBC slightly up to 22.5. Temp high 99 low 100  c-EEG stopped duie to lack of benefit Pallative care consult - wife upset about early dc from ER on 12/18 Chaplain consult: Wife is sufering from anticipatory grief + accepting god's way + curious/anger about events leading upto current state EEG: generalized epileptogenicity with high potential for seizures as well as profound diffuse encephalopathy. In the setting of cardiac arrest, this EEG pattern is suggestive of anoxic/hypoxic brain injury.  1/5 - Low grade fever +. [name].  on Zosyn. On vent fio2 30%. TF +. Per RN - diprivan stopped yesterday. No myoclonus today. Mild tachycardia + Rpt pall care on 10/21/22 per wife 1/6 - On vent FiO2 30%.  Tmax 99,76F ., WBC better. Still off diprivan -> No myoclonus. cXR visualized - devices in place and faiure clear Oral oxy and klonpin stopped 1/8 No significant changes in neuro status. Tachycardic to 130s, hypertensive to 140s. Coreg transitioned to metoprolol. 1/11 wife considering trach/peg, to discuss with surgery-discussed with Dr. Bobbye Morton 1/12 more tachycardic 1/16-spoke with wife at length at bedside, Dr. Hulen Skains also had Ananiah Maciolek long conversation with spouse-desires to continue current aggressive care and desires PEG and trach placed 1/18 perc trach and peg 11/05/2022 transferred to floor due to need for ICU bed 11/08/2022 transition to [facility]  service with pulmonary following weekly for trach   Assessment & Plan:   Principal Problem:   Acute hypoxic respiratory failure ([place]) Active Problems:   CAP (community acquired pneumonia)   Influenza and pneumonia    Elevated LFTs   HTN (hypertension)   HLD (hyperlipidemia)   Tobacco abuse   SIRS (systemic inflammatory response syndrome) (HCC)   Influenza Damon [name]   Cardiac arrest (HCC)   Tobacco dependence   Anoxic encephalopathy (HCC)   Seizure (HCC)   Pressure injury of skin   On mechanically assisted ventilation ([place])   Anoxic brain damage ([place])   Advanced care planning/counseling discussion  Goals of Care See IPAL from 1/24.  He had been DNR, but transitioned back to full code.  Seems there have been frustrations in general regarding Mr. Cugini overall course.  Will continue to follow and discuss with family going forward.  Wife asking about transfer to [facility] - I offered to call, but let her know that based on my experience in these sorts of cases, low chance of acceptance (explained other hospital has to evaluate the case and determine whether there's something different they can offer).  She didn't quite understand the rationale for that - she implied that we wanted to keep Mr. [name] here for financial reasons.  I offered to call, but she said if I wasn't going to fight for him to go the [place], she'd prefer I didn't call.  Acute Hypoxic Respiratory Failure  ARDS PEA Arrest secondary to flu (influenza Maleik [name] positive 12/18) and strep pneumonia (12/20).  12/26 resp culture with rare normal resp flora.   S/p tamiflu 12/21-25.  Ceftriaxone 12/20 - 12/26. Currently trach dependent, PCCM to follow once weekly for trach care  Anoxic brain injury Myoclonic Seizure activity  Neurology discussed with family on 12/31.  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  Signed off 12/31. Keppra, depakene  Recurrent Fevers  Leukocytosis Intermittent fevers throughout hospital stay - last 1/25 1/1, 1/9, 1/13, 1/21 with normal respiratory flora. Blood cultures 1/21 with no growth.  Blood culture 1/13 with no growth.  12/26 blood  culture with no growth.  Had coag negative staph at presentation, suspect that was contaminant. CXR today with low lung volumes without evidence of acute cardiopulmonary disease Zosyn 12/31-1/6.  Cefepime 1/22-present.  Plan for 7 days. Leukocytosis remain elevated today.  If recurrent fevers consider repeat blood cultures, additional workup.  Could consider additional CT imaging, but unlikely to change his overall coarse given above.  Will continue to follow and evaluate as indicated.   Elevated LFT's Monitor  Paroxysmal sympathetic hyperactivity Continue propranolol and neurontin   Tachycardia/hypotension due to paroxysmal sympathetic hyperactivity Continue propranolol and metoprolol prn   Severe protein calorie malnutrition Tube feedings   Prediabetes SSI for hyperglycemia     DVT prophylaxis: lovenox Code Status: full Family Communication: wife 1/26 Disposition:   Status is: Inpatient Remains inpatient appropriate because: continued need for inpatient care   Consultants:  PCCM Palliative General surgery neurology  Procedures:  See significant events above  Antimicrobials:  Anti-infectives (From admission, onward)    Start     Dose/Rate Route Frequency Ordered Stop   11/05/22 0900  ceFEPIme (MAXIPIME) 2 g in sodium chloride 0.9 % 100 mL IVPB        2 g 200 mL/hr over 30 Minutes Intravenous Every 8 hours 11/05/22 0804 11/12/22 0859   11/01/22 1215  ceFAZolin (ANCEF) IVPB 2g/100 mL  premix        2 g 200 mL/hr over 30 Minutes Intravenous  Once 11/01/22 0851 11/01/22 1232   10/14/22 1000  piperacillin-tazobactam (ZOSYN) IVPB 3.375 g        3.375 g 12.5 mL/hr over 240 Minutes Intravenous Every 8 hours 10/14/22 0951 10/21/22 0556   10/04/22 2200  cefTRIAXone (ROCEPHIN) 2 g in sodium chloride 0.9 % 100 mL IVPB  Status:  Discontinued        2 g 200 mL/hr over 30 Minutes Intravenous Daily at bedtime 10/04/22 0914 10/10/22 0900   10/04/22 1045  oseltamivir (TAMIFLU) 6  MG/ML suspension 75 mg        75 mg Per Tube 2 times daily 10/04/22 0950 10/08/22 2229   10/04/22 0600  cefTRIAXone (ROCEPHIN) 2 g in sodium chloride 0.9 % 100 mL IVPB  Status:  Discontinued        2 g 200 mL/hr over 30 Minutes Intravenous Every 24 hours 10/03/22 1054 10/03/22 1250   10/04/22 0600  azithromycin (ZITHROMAX) 500 mg in sodium chloride 0.9 % 250 mL IVPB  Status:  Discontinued        500 mg 250 mL/hr over 60 Minutes Intravenous Every 24 hours 10/03/22 1054 10/03/22 1250   10/03/22 2200  cefTRIAXone (ROCEPHIN) 1 g in sodium chloride 0.9 % 100 mL IVPB  Status:  Discontinued        1 g 200 mL/hr over 30 Minutes Intravenous Daily at bedtime 10/03/22 1559 10/04/22 0914   10/03/22 2200  metroNIDAZOLE (FLAGYL) IVPB 500 mg  Status:  Discontinued        500 mg 100 mL/hr over 60 Minutes Intravenous Every 12 hours 10/03/22 1559 10/05/22 0812   10/03/22 1400  piperacillin-tazobactam (ZOSYN) IVPB 3.375 g  Status:  Discontinued        3.375 g 12.5 mL/hr over 240 Minutes Intravenous Every 8 hours 10/03/22 1258 10/03/22 1559   10/03/22 0515  cefTRIAXone (ROCEPHIN) 1 g in sodium chloride 0.9 % 100 mL IVPB        1 g 200 mL/hr over 30 Minutes Intravenous  Once 10/03/22 0503 10/03/22 0732   10/03/22 0515  azithromycin (ZITHROMAX) 500 mg in sodium chloride 0.9 % 250 mL IVPB        500 mg 250 mL/hr over 60 Minutes Intravenous  Once 10/03/22 0503 10/03/22 0732       Subjective: unresponsive  Objective: Vitals:   11/10/22 1130 11/10/22 1155 11/10/22 1200 11/10/22 1404  BP:  126/82    Pulse: (!) 101 (!) 106 (!) 106 (!) 112  Resp: (!) 22 (!) 27 (!) 25   Temp:  98.2 F (36.8 C)    TempSrc:  Axillary    SpO2: 96% 100% 100%   Weight:      Height:        Intake/Output Summary (Last 24 hours) at 11/10/2022 1425 Last data filed at 11/10/2022 1033 Gross per 24 hour  Intake --  Output 2850 ml  Net -2850 ml   Filed Weights   11/08/22 0424 11/09/22 0326 11/10/22 0430  Weight: 52.6 kg 58.1  kg 57.6 kg    Examination:  General: No acute distress. Cardiovascular: tachy in 100s Lungs: unlabored Abdomen: Soft, nontender, nondistended Neurological: comatose.  No meaningful interaction.  No withdrawal from nailbed pressure. Extremities: No clubbing or cyanosis. No edema.  Data Reviewed: I have personally reviewed following labs and imaging studies  CBC: Recent Labs  Lab 11/05/22 0448 11/06/22 0534 11/08/22 0745 11/09/22  0330 11/10/22 0111  WBC 12.3* 12.5* 19.4* 19.6* 19.4*  NEUTROABS  --  6.7  --  15.1* 11.6*  HGB 10.5* 11.0* 9.5* 9.5* 10.0*  HCT 33.5* 34.0* 29.3* 29.1* 32.0*  MCV 91.3 87.9 88.5 88.4 93.6  PLT 180 220 244 251 248    Basic Metabolic Panel: Recent Labs  Lab 11/05/22 1420 11/06/22 0534 11/08/22 0745 11/09/22 0330 11/10/22 0111  NA 137 138 138 138 137  K 4.4 4.6 4.4 4.5 4.4  CL 101 104 106 106 106  CO2 26 23 23 25 24   GLUCOSE 146* 135* 143* 126* 129*  BUN 46* 46* 43* 43* 45*  CREATININE 0.59* 0.78 0.57* 0.53* 0.54*  CALCIUM 9.1 9.1 8.7* 8.9 8.9  MG  --  2.3  --   --  2.3  PHOS  --   --   --   --  3.1    GFR: Estimated Creatinine Clearance: 80 mL/min (Manoah Deckard) (by C-G formula based on SCr of 0.54 mg/dL (L)).  Liver Function Tests: Recent Labs  Lab 11/05/22 0448 11/09/22 0330 11/10/22 0111  AST 56* 62* 57*  ALT 108* 118* 120*  ALKPHOS 103 102 100  BILITOT 0.5 0.3 0.2*  PROT 6.9 6.8 7.1  ALBUMIN 2.0* 1.9* 2.0*    CBG: Recent Labs  Lab 11/09/22 2006 11/10/22 0109 11/10/22 0426 11/10/22 0729 11/10/22 1200  GLUCAP 113* 108* 125* 90 121*     Recent Results (from the past 240 hour(s))  Culture, Respiratory w Gram Stain     Status: None   Collection Time: 11/04/22  7:46 AM   Specimen: Tracheal Aspirate; Respiratory  Result Value Ref Range Status   Specimen Description TRACHEAL ASPIRATE  Final   Special Requests NONE  Final   Gram Stain   Final    FEW GRAM NEGATIVE RODS FEW WBC PRESENT, PREDOMINANTLY PMN    Culture   Final     FEW Normal respiratory flora-no Staph aureus or Pseudomonas seen Performed at [facility], [address], [place], [place]    Report Status 11/06/2022 FINAL  Final  Culture, blood (Routine X 2) w Reflex to ID Panel     Status: None   Collection Time: 11/04/22  8:00 AM   Specimen: BLOOD  Result Value Ref Range Status   Specimen Description BLOOD RIGHT ANTECUBITAL  Final   Special Requests   Final    BOTTLES DRAWN AEROBIC AND ANAEROBIC Blood Culture adequate volume   Culture   Final    NO GROWTH 5 DAYS Performed at [facility], [address] [place], [place]    Report Status 11/09/2022 FINAL  Final  Culture, blood (Routine X 2) w Reflex to ID Panel     Status: None   Collection Time: 11/04/22  8:07 AM   Specimen: BLOOD  Result Value Ref Range Status   Specimen Description BLOOD RIGHT ANTECUBITAL  Final   Special Requests   Final    BOTTLES DRAWN AEROBIC AND ANAEROBIC Blood Culture adequate volume   Culture   Final    NO GROWTH 5 DAYS Performed at [facility], [address] [place], [place]    Report Status 11/09/2022 FINAL  Final         Radiology Studies: No results found.      Scheduled Meds:  enoxaparin (LOVENOX) injection  40 mg Subcutaneous Q24H   famotidine  20 mg Per Tube QHS   feeding supplement (PROSource  TF20)  60 mL Per Tube BID   fiber supplement (BANATROL TF)  60 mL Per Tube BID   free water  150 mL Per Tube Q4H   gabapentin  100 mg Per Tube Q8H   guaiFENesin  400 mg Per Tube Q8H   leptospermum manuka honey  1 Application Topical QHS   levETIRAcetam  1,500 mg Per Tube BID   nutrition supplement (JUVEN)  1 packet Per Tube BID BM   mouth rinse  15 mL Mouth Rinse Q2H   propranolol  80 mg Per Tube Q8H   valproic acid  500 mg Per Tube Q8H   Continuous Infusions:  sodium chloride Stopped (11/05/22 2230)   ceFEPime (MAXIPIME) IV 2 g (11/10/22 0957)   feeding supplement (OSMOLITE 1.5 CAL) 1,000  mL (11/09/22 2127)     LOS: 38 days    Time spent: over 30 min    Fayrene Helper, MD [facility]   To contact the attending provider between 7A-7P or the covering provider during after hours 7P-7A, please log into the web site [email or web address] and access using universal [place] password for that web site. If you do not have the password, please call the hospital operator.  11/10/2022, 2:25 PM

## 2022-11-11 ENCOUNTER — Inpatient Hospital Stay (HOSPITAL_COMMUNITY): Payer: Medicare Other

## 2022-11-11 DIAGNOSIS — J9601 Acute respiratory failure with hypoxia: Secondary | ICD-10-CM | POA: Diagnosis not present

## 2022-11-11 LAB — GLUCOSE, CAPILLARY
Glucose-Capillary: 104 mg/dL — ABNORMAL HIGH (ref 70–99)
Glucose-Capillary: 107 mg/dL — ABNORMAL HIGH (ref 70–99)
Glucose-Capillary: 115 mg/dL — ABNORMAL HIGH (ref 70–99)
Glucose-Capillary: 121 mg/dL — ABNORMAL HIGH (ref 70–99)
Glucose-Capillary: 127 mg/dL — ABNORMAL HIGH (ref 70–99)
Glucose-Capillary: 128 mg/dL — ABNORMAL HIGH (ref 70–99)
Glucose-Capillary: 130 mg/dL — ABNORMAL HIGH (ref 70–99)

## 2022-11-11 LAB — CBC
HCT: 32.2 % — ABNORMAL LOW (ref 39.0–52.0)
Hemoglobin: 10.5 g/dL — ABNORMAL LOW (ref 13.0–17.0)
MCH: 29.1 pg (ref 26.0–34.0)
MCHC: 32.6 g/dL (ref 30.0–36.0)
MCV: 89.2 fL (ref 80.0–100.0)
Platelets: 292 10*3/uL (ref 150–400)
RBC: 3.61 MIL/uL — ABNORMAL LOW (ref 4.22–5.81)
RDW: 16.9 % — ABNORMAL HIGH (ref 11.5–15.5)
WBC: 23.3 10*3/uL — ABNORMAL HIGH (ref 4.0–10.5)
nRBC: 0.3 % — ABNORMAL HIGH (ref 0.0–0.2)

## 2022-11-11 LAB — BASIC METABOLIC PANEL
Anion gap: 8 (ref 5–15)
BUN: 41 mg/dL — ABNORMAL HIGH (ref 6–20)
CO2: 23 mmol/L (ref 22–32)
Calcium: 8.9 mg/dL (ref 8.9–10.3)
Chloride: 103 mmol/L (ref 98–111)
Creatinine, Ser: 0.5 mg/dL — ABNORMAL LOW (ref 0.61–1.24)
GFR, Estimated: >60 mL/min (ref >60)
Glucose, Bld: 93 mg/dL (ref 70–99)
Potassium: 5 mmol/L (ref 3.5–5.1)
Sodium: 134 mmol/L — ABNORMAL LOW (ref 135–145)

## 2022-11-11 LAB — MAGNESIUM: Magnesium: 2.1 mg/dL (ref 1.7–2.4)

## 2022-11-11 LAB — PHOSPHORUS: Phosphorus: 3.5 mg/dL (ref 2.5–4.6)

## 2022-11-11 NOTE — Plan of Care (Signed)
  Problem: Fluid Volume: Goal: Hemodynamic stability will improve Outcome: Progressing   Problem: Clinical Measurements: Goal: Signs and symptoms of infection will decrease Outcome: Progressing   Problem: Respiratory: Goal: Ability to maintain adequate ventilation will improve Outcome: Progressing   Problem: Respiratory: Goal: Ability to maintain a clear airway and adequate ventilation will improve Outcome: Progressing

## 2022-11-11 NOTE — Progress Notes (Signed)
PROGRESS NOTE    Damon [name]  UUV:[id number] DOB: 1961-07-27 DOA: 10/03/2022 PCP: [facility]  Chief Complaint  Patient presents with   Influenza    Brief Narrative:   62 year old man admitted to [facility] 12/20 in the setting of acute hypoxemic respiratory failure due to Flu [place]. PMHx significant for HTN, HLD, chronic back pain, tobacco abuse    Placed on BIPAP.  Coded (likely in the setting of hypoxia), initial rhythm PEA with CPR x 5 minutes, required intubation.  After several days on mechanical ventilation remains comatose, MRI Brain 12/26 worrisome for anoxic brain injury.  Transferred to [facility] for LTM EEG monitoring.   Neurology consulted. LTM with generalized epileptogenicity. Treated with Propofol, Depakote, Keppra, Ketamine. 12/31 Neurology with goals of care with family. Relayed that patient has Damon [name] grim neurological recovery due to irreversible anoxic injury. Family wished to continue aggressive measures.    Palliative care consulted. Stay complicated with neuro-storming.    Surgical consult regarding PEG/trach  Significant Events 12/18 Flu Damon [name] positive 12/20 Admitted, bipap, 5 minute code MRSA PCR - neg Urine strep _POS 12/21 Intubated, paralyzed, ARDS protocol, on 7cc/kg 12/22 Weaned to 6 cc/kg with dyssynchrony, back on 7 cc/kg, driving pressures good, weaning vent 12/23 Attempt to wean sedation again with dyssynchrony and desaturation.  Some concern for cuff leak, tube exchange.  Cuff did not appear to be blown.  Ongoing signs of cuff leak, query leaking around cough, possible large trachea, diuresing 12/25 tolerating PSV, myoclonus> ceribell, Neuro consult, changed to propofol, diuresed  12/26 Changed to DNR. On Propofol, versed.  Tolerating PSV.  MRI brain with hypoxic/anoxic injury Blood culture neg 12/27 GPD's on EEG, pending transfer to [facility] for cEEG NEURO CONSULT 12/28 moved to [facility] for LTM EEG 12/31 family updated by neuro: no chance for meaningful recovery,  they should make plans for comfort measures 1/1 cultures - resp normal flora 1/2 eeg still with epileptogenicity. Palli fam mtg -- family does not want to hear from medical teams unless there is "good news"  1/3  cEEG with burst suppression w highly epileptiform bursts.  1/4 cEEG with burst suppression  + highly epileptiform bursts still.  WBC slightly up to 22.5. Temp high 99 low 100  c-EEG stopped duie to lack of benefit Pallative care consult - wife upset about early dc from ER on 12/18 Chaplain consult: Wife is sufering from anticipatory grief + accepting god's way + curious/anger about events leading upto current state EEG: generalized epileptogenicity with high potential for seizures as well as profound diffuse encephalopathy. In the setting of cardiac arrest, this EEG pattern is suggestive of anoxic/hypoxic brain injury.  1/5 - Low grade fever +. [name].  on Zosyn. On vent fio2 30%. TF +. Per RN - diprivan stopped yesterday. No myoclonus today. Mild tachycardia + Rpt pall care on 10/21/22 per wife 1/6 - On vent FiO2 30%.  Tmax 99,76F ., WBC better. Still off diprivan -> No myoclonus. cXR visualized - devices in place and faiure clear Oral oxy and klonpin stopped 1/8 No significant changes in neuro status. Tachycardic to 130s, hypertensive to 140s. Coreg transitioned to metoprolol. 1/11 wife considering trach/peg, to discuss with surgery-discussed with Dr. Bobbye Morton 1/12 more tachycardic 1/16-spoke with wife at length at bedside, Dr. Hulen Skains also had Damon [name] long conversation with spouse-desires to continue current aggressive care and desires PEG and trach placed 1/18 perc trach and peg 11/05/2022 transferred to floor due to need for ICU bed 11/08/2022 transition to [facility]  service with pulmonary following weekly for trach   Assessment & Plan:   Principal Problem:   Acute hypoxic respiratory failure (HCC) Active Problems:   CAP (community acquired pneumonia)   Influenza and pneumonia    Elevated LFTs   HTN (hypertension)   HLD (hyperlipidemia)   Tobacco abuse   SIRS (systemic inflammatory response syndrome) (HCC)   Influenza Damon [name]   Cardiac arrest (HCC)   Tobacco dependence   Anoxic encephalopathy (HCC)   Seizure (HCC)   Pressure injury of skin   On mechanically assisted ventilation (HCC)   Anoxic brain damage (HCC)   Advanced care planning/counseling discussion  Goals of Care See IPAL from 1/24.  He had been DNR, but transitioned back to full code.  Seems there have been frustrations in general regarding [name] overall course.  Will continue to follow and discuss with family going forward.  Wife continues to ask about transfer to [facility].  I think she's just unhappy with his hospital course overall and the level of trust is extremely low.  I again offered to call, but she notes if we're not going to "fight" for him to go to [facility], she'd prefer we didn't call and ask (she notes she feels she'd be disappointed/upset in that scenario with [place] as well).  Apparently our CMO has previously spoken to her.  She's also had Kanitra Purifoy Optometrist on her behalf.  I'll ask for follow up on these things for her tmrw.  Acute Hypoxic Respiratory Failure  ARDS PEA Arrest secondary to flu (influenza Damon [name] positive 12/18) and strep pneumonia (12/20).  12/26 resp culture with rare normal resp flora.   S/p tamiflu 12/21-25.  Ceftriaxone 12/20 - 12/26. Currently trach dependent, PCCM to follow once weekly for trach care  Anoxic brain injury Myoclonic Seizure activity  Neurology discussed with family on 12/31.  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  Signed off 12/31. Keppra, depakene  Recurrent Fevers  Leukocytosis Intermittent fevers throughout hospital stay - last 1/25 1/1, 1/9, 1/13, 1/21 with normal respiratory flora. Blood cultures 1/21 with no growth.  Blood culture 1/13 with no growth.  12/26 blood  culture with no growth.  Had coag negative staph at presentation, suspect that was contaminant. CXR today with low lung volumes without evidence of acute cardiopulmonary disease Zosyn 12/31-1/6.  Cefepime 1/22-present.  Plan for 7 days. Leukocytosis worsening today.  Not recurrent fevers. If recurrent fevers consider repeat blood cultures, additional workup.  Could consider additional CT imaging, but unlikely to change his overall course given above.  Will continue to follow and evaluate as indicated.  Repeat CXR.   Elevated LFT's Monitor  Paroxysmal sympathetic hyperactivity Continue propranolol and neurontin   Tachycardia/hypotension due to paroxysmal sympathetic hyperactivity Continue propranolol and metoprolol prn   Severe protein calorie malnutrition Tube feedings   Prediabetes SSI for hyperglycemia     DVT prophylaxis: lovenox Code Status: full Family Communication: wife 1/28 Disposition:   Status is: Inpatient Remains inpatient appropriate because: continued need for inpatient care   Consultants:  PCCM Palliative General surgery neurology  Procedures:  See significant events above  Antimicrobials:  Anti-infectives (From admission, onward)    Start     Dose/Rate Route Frequency Ordered Stop   11/05/22 0900  ceFEPIme (MAXIPIME) 2 g in sodium chloride 0.9 % 100 mL IVPB        2 g 200 mL/hr over 30 Minutes Intravenous Every 8 hours 11/05/22 0804 11/12/22  4098   11/01/22 1215  ceFAZolin (ANCEF) IVPB 2g/100 mL premix        2 g 200 mL/hr over 30 Minutes Intravenous  Once 11/01/22 0851 11/01/22 1232   10/14/22 1000  piperacillin-tazobactam (ZOSYN) IVPB 3.375 g        3.375 g 12.5 mL/hr over 240 Minutes Intravenous Every 8 hours 10/14/22 0951 10/21/22 0556   10/04/22 2200  cefTRIAXone (ROCEPHIN) 2 g in sodium chloride 0.9 % 100 mL IVPB  Status:  Discontinued        2 g 200 mL/hr over 30 Minutes Intravenous Daily at bedtime 10/04/22 0914 10/10/22 0900   10/04/22  1045  oseltamivir (TAMIFLU) 6 MG/ML suspension 75 mg        75 mg Per Tube 2 times daily 10/04/22 0950 10/08/22 2229   10/04/22 0600  cefTRIAXone (ROCEPHIN) 2 g in sodium chloride 0.9 % 100 mL IVPB  Status:  Discontinued        2 g 200 mL/hr over 30 Minutes Intravenous Every 24 hours 10/03/22 1054 10/03/22 1250   10/04/22 0600  azithromycin (ZITHROMAX) 500 mg in sodium chloride 0.9 % 250 mL IVPB  Status:  Discontinued        500 mg 250 mL/hr over 60 Minutes Intravenous Every 24 hours 10/03/22 1054 10/03/22 1250   10/03/22 2200  cefTRIAXone (ROCEPHIN) 1 g in sodium chloride 0.9 % 100 mL IVPB  Status:  Discontinued        1 g 200 mL/hr over 30 Minutes Intravenous Daily at bedtime 10/03/22 1559 10/04/22 0914   10/03/22 2200  metroNIDAZOLE (FLAGYL) IVPB 500 mg  Status:  Discontinued        500 mg 100 mL/hr over 60 Minutes Intravenous Every 12 hours 10/03/22 1559 10/05/22 0812   10/03/22 1400  piperacillin-tazobactam (ZOSYN) IVPB 3.375 g  Status:  Discontinued        3.375 g 12.5 mL/hr over 240 Minutes Intravenous Every 8 hours 10/03/22 1258 10/03/22 1559   10/03/22 0515  cefTRIAXone (ROCEPHIN) 1 g in sodium chloride 0.9 % 100 mL IVPB        1 g 200 mL/hr over 30 Minutes Intravenous  Once 10/03/22 0503 10/03/22 0732   10/03/22 0515  azithromycin (ZITHROMAX) 500 mg in sodium chloride 0.9 % 250 mL IVPB        500 mg 250 mL/hr over 60 Minutes Intravenous  Once 10/03/22 0503 10/03/22 0732       Subjective: Unresponsive Long conversation with his wife  Objective: Vitals:   11/11/22 1400 11/11/22 1501 11/11/22 1520 11/11/22 1800  BP:   (!) 135/94   Pulse:  (!) 111 (!) 110   Resp: 19  20 (!) 21  Temp:  98 F (36.7 C)    TempSrc:  Oral    SpO2:   100%   Weight:      Height:        Intake/Output Summary (Last 24 hours) at 11/11/2022 1825 Last data filed at 11/11/2022 0341 Gross per 24 hour  Intake --  Output 640 ml  Net -640 ml   Filed Weights   11/09/22 0326 11/10/22 0430  11/11/22 0335  Weight: 58.1 kg 57.6 kg 57.2 kg    Examination:  General: No acute distress. Cardiovascular: RRR, sinus tach Lungs: unlabored, trach Abdomen: Soft, nontender, nondistended Neurological: comatose, no meaningful interactions Extremities: No clubbing or cyanosis. No edema.  Data Reviewed: I have personally reviewed following labs and imaging studies  CBC: Recent Labs  Lab  11/06/22 0534 11/08/22 0745 11/09/22 0330 11/10/22 0111 11/11/22 1023  WBC 12.5* 19.4* 19.6* 19.4* 23.3*  NEUTROABS 6.7  --  15.1* 11.6*  --   HGB 11.0* 9.5* 9.5* 10.0* 10.5*  HCT 34.0* 29.3* 29.1* 32.0* 32.2*  MCV 87.9 88.5 88.4 93.6 89.2  PLT 220 244 251 248 [id number]    Basic Metabolic Panel: Recent Labs  Lab 11/06/22 0534 11/08/22 0745 11/09/22 0330 11/10/22 0111 11/11/22 1023  NA 138 138 138 137 134*  K 4.6 4.4 4.5 4.4 5.0  CL 104 106 106 106 103  CO2 23 23 25 24 23   GLUCOSE 135* 143* 126* 129* 93  BUN 46* 43* 43* 45* 41*  CREATININE 0.78 0.57* 0.53* 0.54* 0.50*  CALCIUM 9.1 8.7* 8.9 8.9 8.9  MG 2.3  --   --  2.3 2.1  PHOS  --   --   --  3.1 3.5    GFR: Estimated Creatinine Clearance: 79.4 mL/min (Damon [name]) (by C-G formula based on SCr of 0.5 mg/dL (L)).  Liver Function Tests: Recent Labs  Lab 11/05/22 0448 11/09/22 0330 11/10/22 0111  AST 56* 62* 57*  ALT 108* 118* 120*  ALKPHOS 103 102 100  BILITOT 0.5 0.3 0.2*  PROT 6.9 6.8 7.1  ALBUMIN 2.0* 1.9* 2.0*    CBG: Recent Labs  Lab 11/11/22 0022 11/11/22 0333 11/11/22 0746 11/11/22 1146 11/11/22 1633  GLUCAP 121* 127* 107* 130* 115*     Recent Results (from the past 240 hour(s))  Culture, Respiratory w Gram Stain     Status: None   Collection Time: 11/04/22  7:46 AM   Specimen: Tracheal Aspirate; Respiratory  Result Value Ref Range Status   Specimen Description TRACHEAL ASPIRATE  Final   Special Requests NONE  Final   Gram Stain   Final    FEW GRAM NEGATIVE RODS FEW WBC PRESENT, PREDOMINANTLY PMN    Culture    Final    FEW Normal respiratory flora-no Staph aureus or Pseudomonas seen Performed at [facility], [address], [place], [place] [address]    Report Status 11/06/2022 FINAL  Final  Culture, blood (Routine X 2) w Reflex to ID Panel     Status: None   Collection Time: 11/04/22  8:00 AM   Specimen: BLOOD  Result Value Ref Range Status   Specimen Description BLOOD RIGHT ANTECUBITAL  Final   Special Requests   Final    BOTTLES DRAWN AEROBIC AND ANAEROBIC Blood Culture adequate volume   Culture   Final    NO GROWTH 5 DAYS Performed at [facility], [place] [address], [place], [place] [address]    Report Status 11/09/2022 FINAL  Final  Culture, blood (Routine X 2) w Reflex to ID Panel     Status: None   Collection Time: 11/04/22  8:07 AM   Specimen: BLOOD  Result Value Ref Range Status   Specimen Description BLOOD RIGHT ANTECUBITAL  Final   Special Requests   Final    BOTTLES DRAWN AEROBIC AND ANAEROBIC Blood Culture adequate volume   Culture   Final    NO GROWTH 5 DAYS Performed at [facility], [place] [address], [place],  [address]    Report Status 11/09/2022 FINAL  Final         Radiology Studies: No results found.      Scheduled Meds:  enoxaparin (LOVENOX) injection  40 mg Subcutaneous Q24H   famotidine  20 mg Per Tube QHS  feeding supplement (PROSource TF20)  60 mL Per Tube BID   fiber supplement (BANATROL TF)  60 mL Per Tube BID   free water  150 mL Per Tube Q4H   gabapentin  100 mg Per Tube Q8H   guaiFENesin  400 mg Per Tube Q8H   leptospermum manuka honey  1 Application Topical QHS   levETIRAcetam  1,500 mg Per Tube BID   nutrition supplement (JUVEN)  1 packet Per Tube BID BM   mouth rinse  15 mL Mouth Rinse Q2H   propranolol  80 mg Per Tube Q8H   valproic acid  500 mg Per Tube Q8H   Continuous Infusions:  sodium chloride Stopped (11/05/22 2230)   ceFEPime (MAXIPIME) IV 2 g (11/11/22 1808)   feeding supplement (OSMOLITE 1.5 CAL)  1,000 mL (11/11/22 0316)     LOS: 39 days    Time spent: over 30 min    Fayrene Helper, MD [facility]   To contact the attending provider between 7A-7P or the covering provider during after hours 7P-7A, please log into the web site [email or web address] and access using universal [place] password for that web site. If you do not have the password, please call the hospital operator.  11/11/2022, 6:25 PM

## 2022-11-12 DIAGNOSIS — Z930 Tracheostomy status: Secondary | ICD-10-CM | POA: Diagnosis not present

## 2022-11-12 DIAGNOSIS — J9601 Acute respiratory failure with hypoxia: Secondary | ICD-10-CM | POA: Diagnosis not present

## 2022-11-12 DIAGNOSIS — G931 Anoxic brain damage, not elsewhere classified: Secondary | ICD-10-CM | POA: Diagnosis not present

## 2022-11-12 LAB — CBC WITH DIFFERENTIAL/PLATELET
Abs Immature Granulocytes: 0 10*3/uL (ref 0.00–0.07)
Basophils Absolute: 0.2 10*3/uL — ABNORMAL HIGH (ref 0.0–0.1)
Basophils Relative: 1 %
Eosinophils Absolute: 0.2 10*3/uL (ref 0.0–0.5)
Eosinophils Relative: 1 %
HCT: 32.9 % — ABNORMAL LOW (ref 39.0–52.0)
Hemoglobin: 10.2 g/dL — ABNORMAL LOW (ref 13.0–17.0)
Lymphocytes Relative: 8 %
Lymphs Abs: 1.9 10*3/uL (ref 0.7–4.0)
MCH: 28.2 pg (ref 26.0–34.0)
MCHC: 31 g/dL (ref 30.0–36.0)
MCV: 90.9 fL (ref 80.0–100.0)
Monocytes Absolute: 2.9 10*3/uL — ABNORMAL HIGH (ref 0.1–1.0)
Monocytes Relative: 12 %
Neutro Abs: 18.8 10*3/uL — ABNORMAL HIGH (ref 1.7–7.7)
Neutrophils Relative %: 78 %
Platelets: 310 10*3/uL (ref 150–400)
RBC: 3.62 MIL/uL — ABNORMAL LOW (ref 4.22–5.81)
RDW: 16.6 % — ABNORMAL HIGH (ref 11.5–15.5)
WBC: 24.1 10*3/uL — ABNORMAL HIGH (ref 4.0–10.5)
nRBC: 0 /100 WBC
nRBC: 0.1 % (ref 0.0–0.2)

## 2022-11-12 LAB — MAGNESIUM: Magnesium: 2.2 mg/dL (ref 1.7–2.4)

## 2022-11-12 LAB — COMPREHENSIVE METABOLIC PANEL
ALT: 95 U/L — ABNORMAL HIGH (ref 0–44)
AST: 48 U/L — ABNORMAL HIGH (ref 15–41)
Albumin: 2.1 g/dL — ABNORMAL LOW (ref 3.5–5.0)
Alkaline Phosphatase: 100 U/L (ref 38–126)
Anion gap: 8 (ref 5–15)
BUN: 41 mg/dL — ABNORMAL HIGH (ref 6–20)
CO2: 26 mmol/L (ref 22–32)
Calcium: 8.8 mg/dL — ABNORMAL LOW (ref 8.9–10.3)
Chloride: 99 mmol/L (ref 98–111)
Creatinine, Ser: 0.55 mg/dL — ABNORMAL LOW (ref 0.61–1.24)
GFR, Estimated: >60 mL/min (ref >60)
Glucose, Bld: 122 mg/dL — ABNORMAL HIGH (ref 70–99)
Potassium: 4.5 mmol/L (ref 3.5–5.1)
Sodium: 133 mmol/L — ABNORMAL LOW (ref 135–145)
Total Bilirubin: 0.2 mg/dL — ABNORMAL LOW (ref 0.3–1.2)
Total Protein: 7.1 g/dL (ref 6.5–8.1)

## 2022-11-12 LAB — PHOSPHORUS: Phosphorus: 3.3 mg/dL (ref 2.5–4.6)

## 2022-11-12 LAB — C-REACTIVE PROTEIN: CRP: 1.2 mg/dL — ABNORMAL HIGH (ref <1.0)

## 2022-11-12 LAB — GLUCOSE, CAPILLARY
Glucose-Capillary: 101 mg/dL — ABNORMAL HIGH (ref 70–99)
Glucose-Capillary: 113 mg/dL — ABNORMAL HIGH (ref 70–99)
Glucose-Capillary: 125 mg/dL — ABNORMAL HIGH (ref 70–99)
Glucose-Capillary: 129 mg/dL — ABNORMAL HIGH (ref 70–99)
Glucose-Capillary: 133 mg/dL — ABNORMAL HIGH (ref 70–99)
Glucose-Capillary: 136 mg/dL — ABNORMAL HIGH (ref 70–99)

## 2022-11-12 LAB — SEDIMENTATION RATE: Sed Rate: 104 mm/hr — ABNORMAL HIGH (ref 0–16)

## 2022-11-12 NOTE — TOC Initial Note (Signed)
Transition of Care [facility]) - Initial/Assessment Note    Patient Details  Name: Damon [name] MRN: [id number] Date of Birth: 1961-08-14  Transition [facility]) CM/SW Contact:    Bethena Roys, RN Phone Number: 11/12/2022, 11:43 AM  Clinical Narrative: Patient transferred from [place] ICU. Patient initially presented for acute hypoxemic respiratory failure-MRI reflected anoxic brain injury-trach/peg in place. Patient is being followed by palliative and critical care. Patient discussed in progression rounds this morning-at this time unsure of disposition. Case Manager checked with [facility] last week to see if patient is a candidate and currently he is not a candidate secondary to prognosis. Case Manager will continue to follow for transition of care needs.                  Expected Discharge Plan:  (TBD)  Expected Discharge Plan and Services In-house Referral: Clinical Social Work Discharge Planning Services: CM Consult Post Acute Care Choice: NA Living arrangements for the past 2 months: [place]                   DME Agency: NA  Prior Living Arrangements/Services Living arrangements for the past 2 months: [place] Lives with:: Spouse   Activities of Daily Living Home Assistive Devices/Equipment: None ADL Screening (condition at time of admission) Patient's cognitive ability adequate to safely complete daily activities?: Yes Is the patient deaf or have difficulty hearing?: Yes Does the patient have difficulty seeing, even when wearing glasses/contacts?: No Does the patient have difficulty concentrating, remembering, or making decisions?: No Patient able to express need for assistance with ADLs?: Yes Does the patient have difficulty dressing or bathing?: No Independently performs ADLs?: Yes (appropriate for developmental age) Does the patient have difficulty walking or climbing stairs?: No Weakness of Legs: None Weakness of Arms/Hands:  None  Alcohol / Substance Use: Not Applicable Psych Involvement: No (comment)  Admission diagnosis:  Influenza A [J10.1] Acute hypoxic respiratory failure ([place]) [J96.01] Patient Active Problem List   Diagnosis Date Noted   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   Advanced care planning/counseling discussion 10/21/2022   On mechanically assisted ventilation ([place]) 10/17/2022   Anoxic brain damage ([place]) 10/17/2022   Anoxic encephalopathy ([place]) 10/16/2022   Seizure ([place]) 10/16/2022   Pressure injury of skin 10/16/2022   Acute hypoxic respiratory failure ([place]) 10/03/2022   CAP (community acquired pneumonia) 10/03/2022   Influenza and pneumonia 10/03/2022   Elevated LFTs 10/03/2022   HTN (hypertension) 10/03/2022   HLD (hyperlipidemia) 10/03/2022   Tobacco abuse 10/03/2022   SIRS (systemic inflammatory response syndrome) ([place]) 10/03/2022   Influenza A 10/03/2022   Cardiac arrest ([place]) 10/03/2022   Tobacco dependence 10/03/2022   PCP:  [facility], [place]:   [facility] [place], [place] [address] Phone: [phone] Fax: [phone]  Social Determinants of Health (SDOH) Social History: SDOH Screenings   Food Insecurity: No Food Insecurity (10/04/2022)  Housing: Low Risk  (10/04/2022)  Transportation Needs: No Transportation Needs (10/04/2022)  Utilities: Not At Risk (10/04/2022)  Tobacco Use: High Risk (11/02/2022)   Readmission Risk Interventions     No data to display

## 2022-11-12 NOTE — Progress Notes (Signed)
NAME:  Damon [name], MRN:  [id number], DOB:  19-Sep-1961, LOS: 2 ADMISSION DATE:  10/03/2022, CONSULTATION DATE:  10/03/2022 REFERRING MD:  Marylyn Ishihara - TRH CHIEF COMPLAINT:  Dyspnea   History of Present Illness:  62 year old man admitted to [facility] 12/20 in the setting of acute hypoxemic respiratory failure due to Flu A. PMHx significant for HTN, HLD, chronic back pain, tobacco abuse   Placed on BIPAP.  Coded (likely in the setting of hypoxia), initial rhythm PEA with CPR x 5 minutes, required intubation.  After several days on mechanical ventilation remains comatose, MRI Brain 12/26 worrisome for anoxic brain injury.  Transferred to [facility] for LTM EEG monitoring.  Neurology consulted. LTM with generalized epileptogenicity. Treated with Propofol, Depakote, Keppra, Ketamine. 12/31 Neurology with goals of care with family. Relayed that patient has a grim neurological recovery due to irreversible anoxic injury. Family wished to continue aggressive measures.   Palliative care consulted. Stay complicated with neuro-storming.   Surgical consult regarding PEG/trach  Pertinent Medical History:   Past Medical History:  Diagnosis Date   Elevated lipids    Hypertension    Tobacco abuse    Significant Hospital Events: Including procedures, antibiotic start and stop dates in addition to other pertinent events   12/18 Flu A positive 12/20 Admitted, 5 minute code after non-compliance with BiPAP MRSA PCR - neg Urine strep _POS 12/21 Intubated, paralyzed, ARDS protocol, on 7cc/kg 12/22 Weaned to 6 cc/kg with dyssynchrony, back on 7 cc/kg, driving pressures good, weaning vent 12/23 Attempt to wean sedation again with dyssynchrony and desaturation.  Some concern for cuff leak, tube exchange.  Cuff did not appear to be blown.  Ongoing signs of cuff leak, query leaking around cough, possible large trachea, diuresing 12/25 tolerating PSV, myoclonus> ceribell, Neuro consult, changed to propofol, diuresed  12/26  Changed to DNR. On Propofol, versed.  Tolerating PSV.  MRI brain with hypoxic/anoxic injury Blood culture neg 12/27 GPD's on EEG, pending transfer to [facility] for cEEG NEURO CONSULT 12/28 moved to [facility] for LTM EEG 12/31 family updated by neuro: no chance for meaningful recovery, they should make plans for comfort measures 1/1 cultures - resp normal flora 1/2 eeg still with epileptogenicity. Palli fam mtg -- family does not want to hear from medical teams unless there is "good news"  1/3  cEEG with burst suppression w highly epileptiform bursts.  1/4 cEEG with burst suppression  + highly epileptiform bursts still.  WBC slightly up to 22.5. Temp high 99 low 100  c-EEG stopped duie to lack of benefit Pallative care consult - wife upset about early dc from ER on 12/18 Chaplain consult: Wife is sufering from anticipatory grief + accepting god's way + curious/anger about events leading upto current state EEG: generalized epileptogenicity with high potential for seizures as well as profound diffuse encephalopathy. In the setting of cardiac arrest, this EEG pattern is suggestive of anoxic/hypoxic brain injury.  1/5 - Low grade fever +. [place] 22.5K.  on Zosyn. On vent fio2 30%. TF +. Per RN - diprivan stopped yesterday. No myoclonus today. Mild tachycardia + Rpt pall care on 10/21/22 per wife 1/6 - On vent FiO2 30%.  Tmax 99,59F ., WBC better. Still off diprivan -> No myoclonus. cXR visualized - devices in place and faiure clear Oral oxy and klonpin stopped 1/8 No significant changes in neuro status. Tachycardic to 130s, hypertensive to 140s. Coreg transitioned to metoprolol. 1/11 wife considering trach/peg, to discuss with surgery-discussed with Dr. Bobbye Morton 1/12 more tachycardic 1/16-spoke  with wife at length at bedside, Dr. Hulen Skains also had a long conversation with spouse-desires to continue current aggressive care and desires PEG and trach placed 1/18 perc trach and peg 11/05/2022 transferred to floor due to need  for ICU bed 11/08/2022 transition to [facility] with pulmonary following weekly for trach  Interim History / Subjective:   Objective:  Blood pressure 138/78, pulse 97, temperature 98.8 F (37.1 C), temperature source Axillary, resp. rate 20, height 5\' 4"  (1.626 m), weight 56.7 kg, SpO2 100 %.    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 HEENT temporal wasting size 6 cuffed tracheostomy is midline continues to have thick white/yellow-tinged secretions Pulmonary: Scattered rhonchi currently on 28% aerosol trach collar Cardiac: Regular rate and rhythm without murmur rub or gallop Abdomen: Soft nontender Neuro: Eyes are open but not interactive, does not follow commands, does have cough but it is ineffective Extremities: Warm dry  Resolved   Flu A AKI CAP, pneumococcal pneumonia  S/p Zosyn 12/31 - 1/7; Ceftriaxone 12/20- 12/27, Azithro 12/20, Flagyl 12/20 - 12/22  Assessment & Plan:   Respiratory failure Anoxic brain injury, ventilator dependent s/p perc trach 1/18 Acute respiratory failure with hypoxia/ARDS secondary to flu and strep (completed rx) Seizures Paroxysmal sympathetic hyperactivity Fever Leukocytosis Anemia Hyperkalemia Tachycardia/hypotension due to paroxysmal sympathetic hyperactivity Severe protein calorie malnutrition hyperglycemia   Pulm problem list 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  He is not a candidate for decannulation given his ineffective airway clearance  Plan Continue routine trach care Monitor fever and  white blood cell curve His tracheostomy was placed on the 19th, he can be changed to a cuffless trach anytime after February 2 We will see him again next week   Best Practice (right click and "Reselect all SmartList Selections" daily)   Per primary   Erick Colace ACNP-BC [place] Pager # [phone] OR # [phone] if no answer

## 2022-11-12 NOTE — Progress Notes (Signed)
PROGRESS NOTE    Damon [name]  UUV:[id number] DOB: 1961-07-27 DOA: 10/03/2022 PCP: [facility]  Chief Complaint  Patient presents with   Influenza    Brief Narrative:   62 year old man admitted to [facility] 12/20 in the setting of acute hypoxemic respiratory failure due to Flu Damon [name]. PMHx significant for HTN, HLD, chronic back pain, tobacco abuse    Placed on BIPAP.  Coded (likely in the setting of hypoxia), initial rhythm PEA with CPR x 5 minutes, required intubation.  After several days on mechanical ventilation remains comatose, MRI Brain 12/26 worrisome for anoxic brain injury.  Transferred to [facility] for LTM EEG monitoring.   Neurology consulted. LTM with generalized epileptogenicity. Treated with Propofol, Depakote, Keppra, Ketamine. 12/31 Neurology with goals of care with family. Relayed that patient has Damon [name] grim neurological recovery due to irreversible anoxic injury. Family wished to continue aggressive measures.    Palliative care consulted. Stay complicated with neuro-storming.    Surgical consult regarding PEG/trach  Significant Events 12/18 Flu Damon [name] positive 12/20 Admitted, bipap, 5 minute code MRSA PCR - neg Urine strep _POS 12/21 Intubated, paralyzed, ARDS protocol, on 7cc/kg 12/22 Weaned to 6 cc/kg with dyssynchrony, back on 7 cc/kg, driving pressures good, weaning vent 12/23 Attempt to wean sedation again with dyssynchrony and desaturation.  Some concern for cuff leak, tube exchange.  Cuff did not appear to be blown.  Ongoing signs of cuff leak, query leaking around cough, possible large trachea, diuresing 12/25 tolerating PSV, myoclonus> ceribell, Neuro consult, changed to propofol, diuresed  12/26 Changed to DNR. On Propofol, versed.  Tolerating PSV.  MRI brain with hypoxic/anoxic injury Blood culture neg 12/27 GPD's on EEG, pending transfer to [facility] for cEEG NEURO CONSULT 12/28 moved to [facility] for LTM EEG 12/31 family updated by neuro: no chance for meaningful recovery,  they should make plans for comfort measures 1/1 cultures - resp normal flora 1/2 eeg still with epileptogenicity. Palli fam mtg -- family does not want to hear from medical teams unless there is "good news"  1/3  cEEG with burst suppression w highly epileptiform bursts.  1/4 cEEG with burst suppression  + highly epileptiform bursts still.  WBC slightly up to 22.5. Temp high 99 low 100  c-EEG stopped duie to lack of benefit Pallative care consult - wife upset about early dc from ER on 12/18 Chaplain consult: Wife is sufering from anticipatory grief + accepting god's way + curious/anger about events leading upto current state EEG: generalized epileptogenicity with high potential for seizures as well as profound diffuse encephalopathy. In the setting of cardiac arrest, this EEG pattern is suggestive of anoxic/hypoxic brain injury.  1/5 - Low grade fever +. [name].  on Zosyn. On vent fio2 30%. TF +. Per RN - diprivan stopped yesterday. No myoclonus today. Mild tachycardia + Rpt pall care on 10/21/22 per wife 1/6 - On vent FiO2 30%.  Tmax 99,76F ., WBC better. Still off diprivan -> No myoclonus. cXR visualized - devices in place and faiure clear Oral oxy and klonpin stopped 1/8 No significant changes in neuro status. Tachycardic to 130s, hypertensive to 140s. Coreg transitioned to metoprolol. 1/11 wife considering trach/peg, to discuss with surgery-discussed with Dr. Bobbye [name] 1/12 more tachycardic 1/16-spoke with wife at length at bedside, Dr. Hulen [name] also had Damon [name] long conversation with spouse-desires to continue current aggressive care and desires PEG and trach placed 1/18 perc trach and peg 11/05/2022 transferred to floor due to need for ICU bed 11/08/2022 transition to [facility]  service with pulmonary following weekly for trach   Assessment & Plan:   Principal Problem:   Acute hypoxic respiratory failure (Damon [name]) Active Problems:   CAP (community acquired pneumonia)   Influenza and pneumonia    Elevated LFTs   HTN (hypertension)   HLD (hyperlipidemia)   Tobacco abuse   SIRS (systemic inflammatory response syndrome) (HCC)   Influenza Damon [name]   Cardiac arrest (HCC)   Tobacco dependence   Anoxic encephalopathy (HCC)   Seizure (HCC)   Pressure injury of skin   On mechanically assisted ventilation (HCC)   Anoxic brain damage ([place])   Advanced care planning/counseling discussion   Tracheostomy status ([place])  Goals of Care See IPAL from 1/24.  He had been DNR, but transitioned back to full code.  Seems there have been frustrations in general regarding Damon [name] overall course.  Will continue to follow and discuss with family going forward.  Wife continues to ask about transfer to [facility].  I think she's just unhappy with his hospital course overall and the level of trust is extremely low.  I've offered to call, but she notes if we're not going to "fight" for him to go to [facility], she'd prefer we didn't call and ask (she notes she feels she'd be disappointed/upset in that scenario with [place] as well).  I've asked for Dr. Hulen [name] to reach back out to her when he has an opportunity.   Acute Hypoxic Respiratory Failure  ARDS PEA Arrest secondary to flu (influenza Damon [name] positive 12/18) and strep pneumonia (12/20).  12/26 resp culture with rare normal resp flora.   S/p tamiflu 12/21-25.  Ceftriaxone 12/20 - 12/26. Currently trach dependent, PCCM to follow once weekly for trach care  Anoxic brain injury Myoclonic Seizure activity  Neurology discussed with family on 12/31.  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  Signed off 12/31. Keppra, depakene  Recurrent Fevers  Leukocytosis Intermittent fevers throughout hospital stay - last 1/25 1/1, 1/9, 1/13, 1/21 with normal respiratory flora. Blood cultures 1/21 with no growth.  Blood culture 1/13 with no growth.  12/26 blood culture with no growth.  Had coag negative staph  at presentation, suspect that was contaminant. CXR today with low lung volumes without evidence of acute cardiopulmonary disease Zosyn 12/31-1/6.  Cefepime 1/22-present.  Plan for 7 days. Leukocytosis continuing to worsen today.  Not recurrent fevers.  Will go ahead and get blood cultures with his rising white count.  His decubitus wound doesn't appear c/w infection. CXR 1/28 without acute abnormality Could consider additional CT imaging, but unlikely to change his overall course given above.  Will continue to follow and evaluate as indicated.     Elevated LFT's Monitor  Paroxysmal sympathetic hyperactivity Continue propranolol and neurontin   Tachycardia/hypotension due to paroxysmal sympathetic hyperactivity Continue propranolol and metoprolol prn   Severe protein calorie malnutrition Tube feedings   Prediabetes SSI for hyperglycemia  Pressure Ulcer Pressure Injury 10/12/22 Sacrum Mid;Upper Unstageable - Full thickness tissue loss in which the base of the injury is covered by slough (yellow, tan, gray, green or brown) and/or eschar (tan, brown or black) in the wound bed. (Active)  10/12/22 0200  Location: Sacrum  Location Orientation: Mid;Upper  Staging: Unstageable - Full thickness tissue loss in which the base of the injury is covered by slough (yellow, tan, gray, green or brown) and/or eschar (tan, brown or black) in the wound bed.  Wound Description (Comments):   Present on  Admission: Yes     Pressure Injury 10/23/22 Ear Right;Upper Stage 2 -  Partial thickness loss of dermis presenting as Burnadette Baskett shallow open injury with Melysa Schroyer red, pink wound bed without slough. (Active)  10/23/22 2009  Location: Ear  Location Orientation: Right;Upper  Staging: Stage 2 -  Partial thickness loss of dermis presenting as Torrin Crihfield shallow open injury with Marnell Mcdaniel red, pink wound bed without slough.  Wound Description (Comments):   Present on Admission: No  Continue wound care, per discussion with RN, seems like  it's improved overall     DVT prophylaxis: lovenox Code Status: full Family Communication: wife 1/28 Disposition:   Status is: Inpatient Remains inpatient appropriate because: continued need for inpatient care   Consultants:  PCCM Palliative General surgery neurology  Procedures:  See significant events above  Antimicrobials:  Anti-infectives (From admission, onward)    Start     Dose/Rate Route Frequency Ordered Stop   11/05/22 0900  ceFEPIme (MAXIPIME) 2 g in sodium chloride 0.9 % 100 mL IVPB        2 g 200 mL/hr over 30 Minutes Intravenous Every 8 hours 11/05/22 0804 11/12/22 0050   11/01/22 1215  ceFAZolin (ANCEF) IVPB 2g/100 mL premix        2 g 200 mL/hr over 30 Minutes Intravenous  Once 11/01/22 0851 11/01/22 1232   10/14/22 1000  piperacillin-tazobactam (ZOSYN) IVPB 3.375 g        3.375 g 12.5 mL/hr over 240 Minutes Intravenous Every 8 hours 10/14/22 0951 10/21/22 0556   10/04/22 2200  cefTRIAXone (ROCEPHIN) 2 g in sodium chloride 0.9 % 100 mL IVPB  Status:  Discontinued        2 g 200 mL/hr over 30 Minutes Intravenous Daily at bedtime 10/04/22 0914 10/10/22 0900   10/04/22 1045  oseltamivir (TAMIFLU) 6 MG/ML suspension 75 mg        75 mg Per Tube 2 times daily 10/04/22 0950 10/08/22 2229   10/04/22 0600  cefTRIAXone (ROCEPHIN) 2 g in sodium chloride 0.9 % 100 mL IVPB  Status:  Discontinued        2 g 200 mL/hr over 30 Minutes Intravenous Every 24 hours 10/03/22 1054 10/03/22 1250   10/04/22 0600  azithromycin (ZITHROMAX) 500 mg in sodium chloride 0.9 % 250 mL IVPB  Status:  Discontinued        500 mg 250 mL/hr over 60 Minutes Intravenous Every 24 hours 10/03/22 1054 10/03/22 1250   10/03/22 2200  cefTRIAXone (ROCEPHIN) 1 g in sodium chloride 0.9 % 100 mL IVPB  Status:  Discontinued        1 g 200 mL/hr over 30 Minutes Intravenous Daily at bedtime 10/03/22 1559 10/04/22 0914   10/03/22 2200  metroNIDAZOLE (FLAGYL) IVPB 500 mg  Status:  Discontinued        500  mg 100 mL/hr over 60 Minutes Intravenous Every 12 hours 10/03/22 1559 10/05/22 0812   10/03/22 1400  piperacillin-tazobactam (ZOSYN) IVPB 3.375 g  Status:  Discontinued        3.375 g 12.5 mL/hr over 240 Minutes Intravenous Every 8 hours 10/03/22 1258 10/03/22 1559   10/03/22 0515  cefTRIAXone (ROCEPHIN) 1 g in sodium chloride 0.9 % 100 mL IVPB        1 g 200 mL/hr over 30 Minutes Intravenous  Once 10/03/22 0503 10/03/22 0732   10/03/22 0515  azithromycin (ZITHROMAX) 500 mg in sodium chloride 0.9 % 250 mL IVPB        500  mg 250 mL/hr over 60 Minutes Intravenous  Once 10/03/22 0503 10/03/22 0732       Subjective: Unresponsive Spoke with wife again today  Objective: Vitals:   11/12/22 1458 11/12/22 1502 11/12/22 1544 11/12/22 1635  BP:   122/69 117/73  Pulse: (!) 114 (!) 113 (!) 118 100  Resp: (!) 22 (!) 25  20  Temp:    99.1 F (37.3 C)  TempSrc:    Oral  SpO2: 100% 100%  99%  Weight:      Height:        Intake/Output Summary (Last 24 hours) at 11/12/2022 1640 Last data filed at 11/12/2022 1640 Gross per 24 hour  Intake 150 ml  Output 2775 ml  Net -2625 ml   Filed Weights   11/10/22 0430 11/11/22 0335 11/12/22 0346  Weight: 57.6 kg 57.2 kg 56.7 kg    Examination:  General: No acute distress. Cardiovascular: sinus tach  Lungs: unlabored, trach  Abdomen: Soft, nontender, nondistended  Decubitus evaluated today, not concerning for infection Neurological: Alert and oriented 3. Moves all extremities 4 with equal strength. Cranial nerves II through XII grossly intact. Extremities: No clubbing or cyanosis. No edema.  Data Reviewed: I have personally reviewed following labs and imaging studies  CBC: Recent Labs  Lab 11/06/22 0534 11/08/22 0745 11/09/22 0330 11/10/22 0111 11/11/22 1023 11/12/22 0155  WBC 12.5* 19.4* 19.6* 19.4* 23.3* 24.1*  NEUTROABS 6.7  --  15.1* 11.6*  --  18.8*  HGB 11.0* 9.5* 9.5* 10.0* 10.5* 10.2*  HCT 34.0* 29.3* 29.1* 32.0* 32.2*  32.9*  MCV 87.9 88.5 88.4 93.6 89.2 90.9  PLT 220 244 251 248 292 310    Basic Metabolic Panel: Recent Labs  Lab 11/06/22 0534 11/08/22 0745 11/09/22 0330 11/10/22 0111 11/11/22 1023 11/12/22 0155  NA 138 138 138 137 134* 133*  K 4.6 4.4 4.5 4.4 5.0 4.5  CL 104 106 106 106 103 99  CO2 23 23 25 24 23 26   GLUCOSE 135* 143* 126* 129* 93 122*  BUN 46* 43* 43* 45* 41* 41*  CREATININE 0.78 0.57* 0.53* 0.54* 0.50* 0.55*  CALCIUM 9.1 8.7* 8.9 8.9 8.9 8.8*  MG 2.3  --   --  2.3 2.1 2.2  PHOS  --   --   --  3.1 3.5 3.3    GFR: Estimated Creatinine Clearance: 78.8 mL/min (Tayari Yankee) (by C-G formula based on SCr of 0.55 mg/dL (L)).  Liver Function Tests: Recent Labs  Lab 11/09/22 0330 11/10/22 0111 11/12/22 0155  AST 62* 57* 48*  ALT 118* 120* 95*  ALKPHOS 102 100 100  BILITOT 0.3 0.2* 0.2*  PROT 6.8 7.1 7.1  ALBUMIN 1.9* 2.0* 2.1*    CBG: Recent Labs  Lab 11/11/22 2357 11/12/22 0344 11/12/22 0813 11/12/22 1227 11/12/22 1634  GLUCAP 104* 133* 101* 125* 113*     Recent Results (from the past 240 hour(s))  Culture, Respiratory w Gram Stain     Status: None   Collection Time: 11/04/22  7:46 AM   Specimen: Tracheal Aspirate; Respiratory  Result Value Ref Range Status   Specimen Description TRACHEAL ASPIRATE  Final   Special Requests NONE  Final   Gram Stain   Final    FEW GRAM NEGATIVE RODS FEW WBC PRESENT, PREDOMINANTLY PMN    Culture   Final    FEW Normal respiratory flora-no Staph aureus or Pseudomonas seen Performed at [facility], [address] [place], [place]    Report  Status 11/06/2022 FINAL  Final  Culture, blood (Routine X 2) w Reflex to ID Panel     Status: None   Collection Time: 11/04/22  8:00 AM   Specimen: BLOOD  Result Value Ref Range Status   Specimen Description BLOOD RIGHT ANTECUBITAL  Final   Special Requests   Final    BOTTLES DRAWN AEROBIC AND ANAEROBIC Blood Culture adequate volume   Culture   Final    NO GROWTH 5  DAYS Performed at [facility], [address] [place], [place] [address]    Report Status 11/09/2022 FINAL  Final  Culture, blood (Routine X 2) w Reflex to ID Panel     Status: None   Collection Time: 11/04/22  8:07 AM   Specimen: BLOOD  Result Value Ref Range Status   Specimen Description BLOOD RIGHT ANTECUBITAL  Final   Special Requests   Final    BOTTLES DRAWN AEROBIC AND ANAEROBIC Blood Culture adequate volume   Culture   Final    NO GROWTH 5 DAYS Performed at [facility], [address], [place], [place] [address]    Report Status 11/09/2022 FINAL  Final         Radiology Studies: DG CHEST PORT 1 VIEW  Result Date: 11/11/2022 CLINICAL DATA:  Leukocytosis EXAM: PORTABLE CHEST 1 VIEW COMPARISON:  11/08/2022 FINDINGS: Cardiac shadow is stable. Tracheostomy tube is noted in satisfactory position. Lungs are well aerated without focal infiltrate or sizable effusion. No acute bony abnormality is noted. IMPRESSION: No acute abnormality seen. Electronically Signed   By: Alcide Clever M.D.   On: 11/11/2022 18:43        Scheduled Meds:  enoxaparin (LOVENOX) injection  40 mg Subcutaneous Q24H   famotidine  20 mg Per Tube QHS   feeding supplement (PROSource TF20)  60 mL Per Tube BID   fiber supplement (BANATROL TF)  60 mL Per Tube BID   free water  150 mL Per Tube Q4H   gabapentin  100 mg Per Tube Q8H   guaiFENesin  400 mg Per Tube Q8H   leptospermum manuka honey  1 Application Topical QHS   levETIRAcetam  1,500 mg Per Tube BID   nutrition supplement (JUVEN)  1 packet Per Tube BID BM   mouth rinse  15 mL Mouth Rinse Q2H   propranolol  80 mg Per Tube Q8H   valproic acid  500 mg Per Tube Q8H   Continuous Infusions:  sodium chloride Stopped (11/05/22 2230)   feeding supplement (OSMOLITE 1.5 CAL) 1,000 mL (11/11/22 0316)     LOS: 40 days    Time spent: over 30 min    Lacretia Nicks, MD [facility]   To contact the attending provider between  7A-7P or the covering provider during after hours 7P-7A, please log into the web site [email or web address] and access using universal [place] password for that web site. If you do not have the password, please call the hospital operator.  11/12/2022, 4:40 PM

## 2022-11-13 DIAGNOSIS — J9601 Acute respiratory failure with hypoxia: Secondary | ICD-10-CM | POA: Diagnosis not present

## 2022-11-13 LAB — CBC WITH DIFFERENTIAL/PLATELET
Abs Immature Granulocytes: 3.88 10*3/uL — ABNORMAL HIGH (ref 0.00–0.07)
Basophils Absolute: 0.1 10*3/uL (ref 0.0–0.1)
Basophils Relative: 0 %
Eosinophils Absolute: 0.4 10*3/uL (ref 0.0–0.5)
Eosinophils Relative: 2 %
HCT: 31.5 % — ABNORMAL LOW (ref 39.0–52.0)
Hemoglobin: 10 g/dL — ABNORMAL LOW (ref 13.0–17.0)
Immature Granulocytes: 18 %
Lymphocytes Relative: 11 %
Lymphs Abs: 2.3 10*3/uL (ref 0.7–4.0)
MCH: 28.5 pg (ref 26.0–34.0)
MCHC: 31.7 g/dL (ref 30.0–36.0)
MCV: 89.7 fL (ref 80.0–100.0)
Monocytes Absolute: 1.9 10*3/uL — ABNORMAL HIGH (ref 0.1–1.0)
Monocytes Relative: 9 %
Neutro Abs: 12.7 10*3/uL — ABNORMAL HIGH (ref 1.7–7.7)
Neutrophils Relative %: 60 %
Platelets: 306 10*3/uL (ref 150–400)
RBC: 3.51 MIL/uL — ABNORMAL LOW (ref 4.22–5.81)
RDW: 16.5 % — ABNORMAL HIGH (ref 11.5–15.5)
WBC: 21.2 10*3/uL — ABNORMAL HIGH (ref 4.0–10.5)
nRBC: 0.1 % (ref 0.0–0.2)

## 2022-11-13 LAB — GLUCOSE, CAPILLARY
Glucose-Capillary: 104 mg/dL — ABNORMAL HIGH (ref 70–99)
Glucose-Capillary: 125 mg/dL — ABNORMAL HIGH (ref 70–99)
Glucose-Capillary: 128 mg/dL — ABNORMAL HIGH (ref 70–99)
Glucose-Capillary: 130 mg/dL — ABNORMAL HIGH (ref 70–99)
Glucose-Capillary: 131 mg/dL — ABNORMAL HIGH (ref 70–99)
Glucose-Capillary: 133 mg/dL — ABNORMAL HIGH (ref 70–99)

## 2022-11-13 NOTE — Progress Notes (Signed)
PROGRESS NOTE    Damon [name]  UUV:[id number] DOB: 1961-07-27 DOA: 10/03/2022 PCP: [facility]  Chief Complaint  Patient presents with   Influenza    Brief Narrative:   62 year old man admitted to [name] 12/20 in the setting of acute hypoxemic respiratory failure due to Flu Damon [name]. PMHx significant for HTN, HLD, chronic back pain, tobacco abuse    Placed on BIPAP.  Coded (likely in the setting of hypoxia), initial rhythm PEA with CPR x 5 minutes, required intubation.  After several days on mechanical ventilation remains comatose, MRI Brain 12/26 worrisome for anoxic brain injury.  Transferred to [facility] for LTM EEG monitoring.   Neurology consulted. LTM with generalized epileptogenicity. Treated with Propofol, Depakote, Keppra, Ketamine. 12/31 Neurology with goals of care with family. Relayed that patient has Damon [name] grim neurological recovery due to irreversible anoxic injury. Family wished to continue aggressive measures.    Palliative care consulted. Stay complicated with neuro-storming.    Surgical consult regarding PEG/trach  Significant Events 12/18 Flu Damon [name] positive 12/20 Admitted, bipap, 5 minute code MRSA PCR - neg Urine strep _POS 12/21 Intubated, paralyzed, ARDS protocol, on 7cc/kg 12/22 Weaned to 6 cc/kg with dyssynchrony, back on 7 cc/kg, driving pressures good, weaning vent 12/23 Attempt to wean sedation again with dyssynchrony and desaturation.  Some concern for cuff leak, tube exchange.  Cuff did not appear to be blown.  Ongoing signs of cuff leak, query leaking around cough, possible large trachea, diuresing 12/25 tolerating PSV, myoclonus> ceribell, Neuro consult, changed to propofol, diuresed  12/26 Changed to DNR. On Propofol, versed.  Tolerating PSV.  MRI brain with hypoxic/anoxic injury Blood culture neg 12/27 GPD's on EEG, pending transfer to [facility] for cEEG NEURO CONSULT 12/28 moved to [facility] for LTM EEG 12/31 family updated by neuro: no chance for meaningful recovery,  they should make plans for comfort measures 1/1 cultures - resp normal flora 1/2 eeg still with epileptogenicity. Palli fam mtg -- family does not want to hear from medical teams unless there is "good news"  1/3  cEEG with burst suppression w highly epileptiform bursts.  1/4 cEEG with burst suppression  + highly epileptiform bursts still.  WBC slightly up to 22.5. Temp high 99 low 100  c-EEG stopped duie to lack of benefit Pallative care consult - wife upset about early dc from ER on 12/18 Chaplain consult: Wife is sufering from anticipatory grief + accepting god's way + curious/anger about events leading upto current state EEG: generalized epileptogenicity with high potential for seizures as well as profound diffuse encephalopathy. In the setting of cardiac arrest, this EEG pattern is suggestive of anoxic/hypoxic brain injury.  1/5 - Low grade fever +. Damon [name] 22.5K.  on Zosyn. On vent fio2 30%. TF +. Per RN - diprivan stopped yesterday. No myoclonus today. Mild tachycardia + Rpt pall care on 10/21/22 per wife 1/6 - On vent FiO2 30%.  Tmax 99,76F ., WBC better. Still off diprivan -> No myoclonus. cXR visualized - devices in place and faiure clear Oral oxy and klonpin stopped 1/8 No significant changes in neuro status. Tachycardic to 130s, hypertensive to 140s. Coreg transitioned to metoprolol. 1/11 wife considering trach/peg, to discuss with surgery-discussed with Dr. Bobbye Morton 1/12 more tachycardic 1/16-spoke with wife at length at bedside, Dr. Hulen Skains also had Damon [name] long conversation with spouse-desires to continue current aggressive care and desires PEG and trach placed 1/18 perc trach and peg 11/05/2022 transferred to floor due to need for ICU bed 11/08/2022 transition to [facility]  service with pulmonary following weekly for trach   Assessment & Plan:   Principal Problem:   Acute hypoxic respiratory failure (Damon [name]) Active Problems:   CAP (community acquired pneumonia)   Influenza and pneumonia    Elevated LFTs   HTN (hypertension)   HLD (hyperlipidemia)   Tobacco abuse   SIRS (systemic inflammatory response syndrome) (HCC)   Influenza Damon [name]   Cardiac arrest (HCC)   Tobacco dependence   Anoxic encephalopathy (HCC)   Seizure (HCC)   Pressure injury of skin   On mechanically assisted ventilation (HCC)   Anoxic brain damage ([place])   Advanced care planning/counseling discussion   Tracheostomy status ([place])  Goals of Care See IPAL from 1/24.  He had been DNR, but transitioned back to full code.  Seems there have been frustrations in general regarding [name] overall course.  Will continue to follow and discuss with family going forward.  Wife continues to ask about transfer to [facility].  I think she's just unhappy with his hospital course overall and the level of trust is extremely low.  I've offered to call, but she notes if we're not going to "fight" for him to go to [facility], she'd prefer we didn't call and ask (she notes she feels she'd be disappointed/upset in that scenario with [place] as well).   CMO has been involved in the case and spoke with her again today.  Acute Hypoxic Respiratory Failure  ARDS PEA Arrest secondary to flu (influenza Jshon Ibe positive 12/18) and strep pneumonia (12/20).  12/26 resp culture with rare normal resp flora.   S/p tamiflu 12/21-25.  Ceftriaxone 12/20 - 12/26. Currently trach dependent, PCCM to follow once weekly for trach care  Anoxic brain injury Myoclonic Seizure activity  Neurology discussed with family on 12/31.  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  Signed off 12/31. Keppra, depakene  Recurrent Fevers  Leukocytosis Intermittent fevers throughout hospital stay - last 1/25 1/1, 1/9, 1/13, 1/21 with normal respiratory flora. Blood cultures 1/21 with no growth.  Blood culture 1/13 with no growth.  12/26 blood culture with no growth.  Had coag negative staph at  presentation, suspect that was contaminant. Zosyn 12/31-1/6.  Cefepime 1/22-28.   Leukocytosis continuing to fluctuate.  Not recurrent fevers.   His decubitus wound doesn't appear c/w infection. Blood cultures 1/29 pending CXR 1/28 without acute abnormality Could consider additional CT imaging, but unlikely to change his overall course given above.  Will continue to follow and evaluate as indicated.     Elevated LFT's Monitor  Paroxysmal sympathetic hyperactivity Continue propranolol and neurontin   Tachycardia/hypotension due to paroxysmal sympathetic hyperactivity Continue propranolol and metoprolol prn   Severe protein calorie malnutrition Tube feedings   Prediabetes SSI for hyperglycemia  Pressure Ulcer Pressure Injury 10/12/22 Sacrum Mid;Upper Unstageable - Full thickness tissue loss in which the base of the injury is covered by slough (yellow, tan, gray, green or brown) and/or eschar (tan, brown or black) in the wound bed. (Active)  10/12/22 0200  Location: Sacrum  Location Orientation: Mid;Upper  Staging: Unstageable - Full thickness tissue loss in which the base of the injury is covered by slough (yellow, tan, gray, green or brown) and/or eschar (tan, brown or black) in the wound bed.  Wound Description (Comments):   Present on Admission: Yes     Pressure Injury 10/23/22 Ear Right;Upper Stage 2 -  Partial thickness loss of dermis presenting as Talynn Lebon shallow open injury with  Demerius Podolak red, pink wound bed without slough. (Active)  10/23/22 2009  Location: Ear  Location Orientation: Right;Upper  Staging: Stage 2 -  Partial thickness loss of dermis presenting as Harjit Leider shallow open injury with Amorita Vanrossum red, pink wound bed without slough.  Wound Description (Comments):   Present on Admission: No  Continue wound care, per discussion with RN, seems like it's improved overall     DVT prophylaxis: lovenox Code Status: full Family Communication: wife 1/29 Disposition:   Status is:  Inpatient Remains inpatient appropriate because: continued need for inpatient care   Consultants:  PCCM Palliative General surgery neurology  Procedures:  See significant events above  Antimicrobials:  Anti-infectives (From admission, onward)    Start     Dose/Rate Route Frequency Ordered Stop   11/05/22 0900  ceFEPIme (MAXIPIME) 2 g in sodium chloride 0.9 % 100 mL IVPB        2 g 200 mL/hr over 30 Minutes Intravenous Every 8 hours 11/05/22 0804 11/12/22 0050   11/01/22 1215  ceFAZolin (ANCEF) IVPB 2g/100 mL premix        2 g 200 mL/hr over 30 Minutes Intravenous  Once 11/01/22 0851 11/01/22 1232   10/14/22 1000  piperacillin-tazobactam (ZOSYN) IVPB 3.375 g        3.375 g 12.5 mL/hr over 240 Minutes Intravenous Every 8 hours 10/14/22 0951 10/21/22 0556   10/04/22 2200  cefTRIAXone (ROCEPHIN) 2 g in sodium chloride 0.9 % 100 mL IVPB  Status:  Discontinued        2 g 200 mL/hr over 30 Minutes Intravenous Daily at bedtime 10/04/22 0914 10/10/22 0900   10/04/22 1045  oseltamivir (TAMIFLU) 6 MG/ML suspension 75 mg        75 mg Per Tube 2 times daily 10/04/22 0950 10/08/22 2229   10/04/22 0600  cefTRIAXone (ROCEPHIN) 2 g in sodium chloride 0.9 % 100 mL IVPB  Status:  Discontinued        2 g 200 mL/hr over 30 Minutes Intravenous Every 24 hours 10/03/22 1054 10/03/22 1250   10/04/22 0600  azithromycin (ZITHROMAX) 500 mg in sodium chloride 0.9 % 250 mL IVPB  Status:  Discontinued        500 mg 250 mL/hr over 60 Minutes Intravenous Every 24 hours 10/03/22 1054 10/03/22 1250   10/03/22 2200  cefTRIAXone (ROCEPHIN) 1 g in sodium chloride 0.9 % 100 mL IVPB  Status:  Discontinued        1 g 200 mL/hr over 30 Minutes Intravenous Daily at bedtime 10/03/22 1559 10/04/22 0914   10/03/22 2200  metroNIDAZOLE (FLAGYL) IVPB 500 mg  Status:  Discontinued        500 mg 100 mL/hr over 60 Minutes Intravenous Every 12 hours 10/03/22 1559 10/05/22 0812   10/03/22 1400  piperacillin-tazobactam (ZOSYN)  IVPB 3.375 g  Status:  Discontinued        3.375 g 12.5 mL/hr over 240 Minutes Intravenous Every 8 hours 10/03/22 1258 10/03/22 1559   10/03/22 0515  cefTRIAXone (ROCEPHIN) 1 g in sodium chloride 0.9 % 100 mL IVPB        1 g 200 mL/hr over 30 Minutes Intravenous  Once 10/03/22 0503 10/03/22 0732   10/03/22 0515  azithromycin (ZITHROMAX) 500 mg in sodium chloride 0.9 % 250 mL IVPB        500 mg 250 mL/hr over 60 Minutes Intravenous  Once 10/03/22 0503 10/03/22 0732       Subjective: unresponsive  Objective: Vitals:   11/13/22  1128 11/13/22 1300 11/13/22 1431 11/13/22 1700  BP:  99/60  (!) 109/58  Pulse: (!) 103 (!) 108 (!) 108 97  Resp: (!) 25 (!) 21 (!) 22 (!) 22  Temp:  99.9 F (37.7 C)  99.7 F (37.6 C)  TempSrc:  Axillary  Oral  SpO2: 100% 100% 100% 100%  Weight:      Height:        Intake/Output Summary (Last 24 hours) at 11/13/2022 1859 Last data filed at 11/13/2022 0700 Gross per 24 hour  Intake 4048 ml  Output 680 ml  Net 3368 ml   Filed Weights   11/10/22 0430 11/11/22 0335 11/12/22 0346  Weight: 57.6 kg 57.2 kg 56.7 kg    Examination:  General: No acute distress. Lungs: trach, unlabored Neurological: comatose, no meaningful interaction or reponse to stimuli Extremities: No clubbing or cyanosis. No edema  Data Reviewed: I have personally reviewed following labs and imaging studies  CBC: Recent Labs  Lab 11/09/22 0330 11/10/22 0111 11/11/22 1023 11/12/22 0155 11/13/22 0826  WBC 19.6* 19.4* 23.3* 24.1* 21.2*  NEUTROABS 15.1* 11.6*  --  18.8* 12.7*  HGB 9.5* 10.0* 10.5* 10.2* 10.0*  HCT 29.1* 32.0* 32.2* 32.9* 31.5*  MCV 88.4 93.6 89.2 90.9 89.7  PLT 251 248 292 310 301    Basic Metabolic Panel: Recent Labs  Lab 11/08/22 0745 11/09/22 0330 11/10/22 0111 11/11/22 1023 11/12/22 0155  NA 138 138 137 134* 133*  K 4.4 4.5 4.4 5.0 4.5  CL 106 106 106 103 99  CO2 23 25 24 23 26   GLUCOSE 143* 126* 129* 93 122*  BUN 43* 43* 45* 41* 41*   CREATININE 0.57* 0.53* 0.54* 0.50* 0.55*  CALCIUM 8.7* 8.9 8.9 8.9 8.8*  MG  --   --  2.3 2.1 2.2  PHOS  --   --  3.1 3.5 3.3    GFR: Estimated Creatinine Clearance: 78.8 mL/min (Margretta Zamorano) (by C-G formula based on SCr of 0.55 mg/dL (L)).  Liver Function Tests: Recent Labs  Lab 11/09/22 0330 11/10/22 0111 11/12/22 0155  AST 62* 57* 48*  ALT 118* 120* 95*  ALKPHOS 102 100 100  BILITOT 0.3 0.2* 0.2*  PROT 6.8 7.1 7.1  ALBUMIN 1.9* 2.0* 2.1*    CBG: Recent Labs  Lab 11/12/22 2339 11/13/22 0444 11/13/22 0808 11/13/22 1304 11/13/22 1702  GLUCAP 136* 104* 130* 128* 131*     Recent Results (from the past 240 hour(s))  Culture, Respiratory w Gram Stain     Status: None   Collection Time: 11/04/22  7:46 AM   Specimen: Tracheal Aspirate; Respiratory  Result Value Ref Range Status   Specimen Description TRACHEAL ASPIRATE  Final   Special Requests NONE  Final   Gram Stain   Final    FEW GRAM NEGATIVE RODS FEW WBC PRESENT, PREDOMINANTLY PMN    Culture   Final    FEW Normal respiratory flora-no Staph aureus or Pseudomonas seen Performed at [facility], [address] [place], [place] [address]    Report Status 11/06/2022 FINAL  Final  Culture, blood (Routine X 2) w Reflex to ID Panel     Status: None   Collection Time: 11/04/22  8:00 AM   Specimen: BLOOD  Result Value Ref Range Status   Specimen Description BLOOD RIGHT ANTECUBITAL  Final   Special Requests   Final    BOTTLES DRAWN AEROBIC AND ANAEROBIC Blood Culture adequate volume   Culture   Final  NO GROWTH 5 DAYS Performed at [facility], [place] [address], [place], [place] [address]    Report Status 11/09/2022 FINAL  Final  Culture, blood (Routine X 2) w Reflex to ID Panel     Status: None   Collection Time: 11/04/22  8:07 AM   Specimen: BLOOD  Result Value Ref Range Status   Specimen Description BLOOD RIGHT ANTECUBITAL  Final   Special Requests   Final    BOTTLES DRAWN AEROBIC AND ANAEROBIC Blood  Culture adequate volume   Culture   Final    NO GROWTH 5 DAYS Performed at [facility], [place] [address], [place], [place] [address]    Report Status 11/09/2022 FINAL  Final  Culture, blood (Routine X 2) w Reflex to ID Panel     Status: None (Preliminary result)   Collection Time: 11/12/22  6:20 PM   Specimen: BLOOD  Result Value Ref Range Status   Specimen Description BLOOD LEFT ANTECUBITAL  Final   Special Requests   Final    BOTTLES DRAWN AEROBIC AND ANAEROBIC Blood Culture adequate volume   Culture   Final    NO GROWTH < 12 HOURS Performed at [facility], [place] [address], [place], [place] [address]    Report Status PENDING  Incomplete  Culture, blood (Routine X 2) w Reflex to ID Panel     Status: None (Preliminary result)   Collection Time: 11/12/22  6:20 PM   Specimen: BLOOD LEFT HAND  Result Value Ref Range Status   Specimen Description BLOOD LEFT HAND  Final   Special Requests   Final    BOTTLES DRAWN AEROBIC ONLY Blood Culture results may not be optimal due to an inadequate volume of blood received in culture bottles   Culture   Final    NO GROWTH < 12 HOURS Performed at [facility], [place] [address] [place], [place] [address]    Report Status PENDING  Incomplete         Radiology Studies: No results found.      Scheduled Meds:  enoxaparin (LOVENOX) injection  40 mg Subcutaneous Q24H   famotidine  20 mg Per Tube QHS   feeding supplement (PROSource TF20)  60 mL Per Tube BID   fiber supplement (BANATROL TF)  60 mL Per Tube BID   free water  150 mL Per Tube Q4H   gabapentin  100 mg Per Tube Q8H   guaiFENesin  400 mg Per Tube Q8H   leptospermum manuka honey  1 Application Topical QHS   levETIRAcetam  1,500 mg Per Tube BID   nutrition supplement (JUVEN)  1 packet Per Tube BID BM   mouth rinse  15 mL Mouth Rinse Q2H   propranolol  80 mg Per Tube Q8H   valproic acid  500 mg Per Tube Q8H   Continuous Infusions:  sodium chloride Stopped  (11/05/22 2230)   feeding supplement (OSMOLITE 1.5 CAL) 1,000 mL (11/13/22 0712)     LOS: 41 days    Time spent: over 30 min    Fayrene Helper, MD [facility]   To contact the attending provider between 7A-7P or the covering provider during after hours 7P-7A, please log into the web site [email or web address] and access using universal [place] password for that web site. If you do not have the password, please call the hospital operator.  11/13/2022, 6:59 PM

## 2022-11-13 NOTE — Plan of Care (Signed)
  Problem: Fluid Volume: Goal: Hemodynamic stability will improve Outcome: Progressing   Problem: Clinical Measurements: Goal: Diagnostic test results will improve Outcome: Progressing Goal: Signs and symptoms of infection will decrease Outcome: Progressing   Problem: Respiratory: Goal: Ability to maintain adequate ventilation will improve Outcome: Progressing   Problem: Activity: Goal: Ability to tolerate increased activity will improve Outcome: Progressing   Problem: Respiratory: Goal: Ability to maintain a clear airway and adequate ventilation will improve Outcome: Progressing   Problem: Role Relationship: Goal: Method of communication will improve Outcome: Progressing   Problem: Education: Goal: Ability to describe self-care measures that may prevent or decrease complications (Diabetes Survival Skills Education) will improve Outcome: Progressing Goal: Individualized Educational Video(s) Outcome: Progressing   Problem: Coping: Goal: Ability to adjust to condition or change in health will improve Outcome: Progressing   Problem: Fluid Volume: Goal: Ability to maintain a balanced intake and output will improve Outcome: Progressing   Problem: Health Behavior/Discharge Planning: Goal: Ability to identify and utilize available resources and services will improve Outcome: Progressing Goal: Ability to manage health-related needs will improve Outcome: Progressing   Problem: Metabolic: Goal: Ability to maintain appropriate glucose levels will improve Outcome: Progressing   Problem: Nutritional: Goal: Maintenance of adequate nutrition will improve Outcome: Progressing Goal: Progress toward achieving an optimal weight will improve Outcome: Progressing   Problem: Skin Integrity: Goal: Risk for impaired skin integrity will decrease Outcome: Progressing   Problem: Tissue Perfusion: Goal: Adequacy of tissue perfusion will improve Outcome: Progressing   Problem:  Education: Goal: Knowledge of General Education information will improve Description: Including pain rating scale, medication(s)/side effects and non-pharmacologic comfort measures Outcome: Progressing   Problem: Health Behavior/Discharge Planning: Goal: Ability to manage health-related needs will improve Outcome: Progressing   Problem: Clinical Measurements: Goal: Ability to maintain clinical measurements within normal limits will improve Outcome: Progressing Goal: Will remain free from infection Outcome: Progressing Goal: Diagnostic test results will improve Outcome: Progressing Goal: Respiratory complications will improve Outcome: Progressing Goal: Cardiovascular complication will be avoided Outcome: Progressing   Problem: Activity: Goal: Risk for activity intolerance will decrease Outcome: Progressing   Problem: Nutrition: Goal: Adequate nutrition will be maintained Outcome: Progressing   Problem: Coping: Goal: Level of anxiety will decrease Outcome: Progressing   Problem: Elimination: Goal: Will not experience complications related to bowel motility Outcome: Progressing Goal: Will not experience complications related to urinary retention Outcome: Progressing   Problem: Pain Managment: Goal: General experience of comfort will improve Outcome: Progressing   Problem: Safety: Goal: Ability to remain free from injury will improve Outcome: Progressing   Problem: Skin Integrity: Goal: Risk for impaired skin integrity will decrease Outcome: Progressing   

## 2022-11-13 NOTE — Plan of Care (Signed)
  Problem: Respiratory: Goal: Ability to maintain adequate ventilation will improve Outcome: Progressing   Problem: Respiratory: Goal: Ability to maintain a clear airway and adequate ventilation will improve Outcome: Progressing   Problem: Metabolic: Goal: Ability to maintain appropriate glucose levels will improve Outcome: Progressing   Problem: Elimination: Goal: Will not experience complications related to urinary retention Outcome: Progressing   Problem: Safety: Goal: Ability to remain free from injury will improve Outcome: Progressing

## 2022-11-13 NOTE — Progress Notes (Signed)
RT changed trach tie.

## 2022-11-14 DIAGNOSIS — J9601 Acute respiratory failure with hypoxia: Secondary | ICD-10-CM | POA: Diagnosis not present

## 2022-11-14 LAB — GLUCOSE, CAPILLARY
Glucose-Capillary: 129 mg/dL — ABNORMAL HIGH (ref 70–99)
Glucose-Capillary: 129 mg/dL — ABNORMAL HIGH (ref 70–99)
Glucose-Capillary: 146 mg/dL — ABNORMAL HIGH (ref 70–99)
Glucose-Capillary: 106 mg/dL — ABNORMAL HIGH (ref 70–99)
Glucose-Capillary: 124 mg/dL — ABNORMAL HIGH (ref 70–99)

## 2022-11-14 NOTE — Plan of Care (Signed)
  Problem: Fluid Volume: Goal: Hemodynamic stability will improve Outcome: Progressing   Problem: Clinical Measurements: Goal: Diagnostic test results will improve Outcome: Progressing Goal: Signs and symptoms of infection will decrease Outcome: Progressing   Problem: Respiratory: Goal: Ability to maintain adequate ventilation will improve Outcome: Progressing   Problem: Activity: Goal: Ability to tolerate increased activity will improve Outcome: Progressing   Problem: Respiratory: Goal: Ability to maintain a clear airway and adequate ventilation will improve Outcome: Progressing   Problem: Role Relationship: Goal: Method of communication will improve Outcome: Progressing   Problem: Education: Goal: Ability to describe self-care measures that may prevent or decrease complications (Diabetes Survival Skills Education) will improve Outcome: Progressing Goal: Individualized Educational Video(s) Outcome: Progressing   Problem: Coping: Goal: Ability to adjust to condition or change in health will improve Outcome: Progressing   Problem: Fluid Volume: Goal: Ability to maintain a balanced intake and output will improve Outcome: Progressing   Problem: Health Behavior/Discharge Planning: Goal: Ability to identify and utilize available resources and services will improve Outcome: Progressing Goal: Ability to manage health-related needs will improve Outcome: Progressing   Problem: Metabolic: Goal: Ability to maintain appropriate glucose levels will improve Outcome: Progressing   Problem: Nutritional: Goal: Maintenance of adequate nutrition will improve Outcome: Progressing Goal: Progress toward achieving an optimal weight will improve Outcome: Progressing   Problem: Skin Integrity: Goal: Risk for impaired skin integrity will decrease Outcome: Progressing   Problem: Tissue Perfusion: Goal: Adequacy of tissue perfusion will improve Outcome: Progressing   Problem:  Education: Goal: Knowledge of General Education information will improve Description: Including pain rating scale, medication(s)/side effects and non-pharmacologic comfort measures Outcome: Progressing   Problem: Health Behavior/Discharge Planning: Goal: Ability to manage health-related needs will improve Outcome: Progressing   Problem: Clinical Measurements: Goal: Ability to maintain clinical measurements within normal limits will improve Outcome: Progressing Goal: Will remain free from infection Outcome: Progressing Goal: Diagnostic test results will improve Outcome: Progressing Goal: Respiratory complications will improve Outcome: Progressing Goal: Cardiovascular complication will be avoided Outcome: Progressing   Problem: Activity: Goal: Risk for activity intolerance will decrease Outcome: Progressing   Problem: Nutrition: Goal: Adequate nutrition will be maintained Outcome: Progressing   Problem: Coping: Goal: Level of anxiety will decrease Outcome: Progressing   Problem: Elimination: Goal: Will not experience complications related to bowel motility Outcome: Progressing Goal: Will not experience complications related to urinary retention Outcome: Progressing   Problem: Pain Managment: Goal: General experience of comfort will improve Outcome: Progressing   Problem: Safety: Goal: Ability to remain free from injury will improve Outcome: Progressing   Problem: Skin Integrity: Goal: Risk for impaired skin integrity will decrease Outcome: Progressing   

## 2022-11-14 NOTE — Progress Notes (Addendum)
[facility] PROGRESS NOTE  Damon [name] (DOB: [birth year]) [name]:[id number] PCP: [facility]  Brief Narrative: Damon [name] is a 62 y.o. male with a history of HTN, HLD, tobacco use, chronic back pain who presented to the [facility] on 10/03/2022 with influenza A. He's had a protracted hospitalization summarized below.  Significant Events 12/18 Flu A positive 12/20 Admitted, bipap, 5 minute code MRSA PCR - neg Urine strep _POS 12/21 Intubated, paralyzed, ARDS protocol, on 7cc/kg 12/22 Weaned to 6 cc/kg with dyssynchrony, back on 7 cc/kg, driving pressures good, weaning vent 12/23 Attempt to wean sedation again with dyssynchrony and desaturation.  Some concern for cuff leak, tube exchange.  Cuff did not appear to be blown.  Ongoing signs of cuff leak, query leaking around cough, possible large trachea, diuresing 12/25 tolerating PSV, myoclonus> ceribell, Neuro consult, changed to propofol, diuresed  12/26 Changed to DNR. On Propofol, versed.  Tolerating PSV.  MRI brain with hypoxic/anoxic injury Blood culture neg 12/27 GPD's on EEG, pending transfer to [facility] for cEEG NEURO CONSULT 12/28 moved to [facility] for LTM EEG 12/31 family updated by neuro: no chance for meaningful recovery, they should make plans for comfort measures 1/1 cultures - resp normal flora 1/2 eeg still with epileptogenicity. Palli fam mtg -- family does not want to hear from medical teams unless there is "good news"  1/3  cEEG with burst suppression w highly epileptiform bursts.  1/4 cEEG with burst suppression  + highly epileptiform bursts still.  WBC slightly up to 22.5. Temp high 99 low 100  c-EEG stopped duie to lack of benefit Pallative care consult - wife upset about early dc from ER on 12/18 Chaplain consult: Wife is sufering from anticipatory grief + accepting god's way + curious/anger about events leading upto current state EEG: generalized epileptogenicity with high potential for seizures as well as  profound diffuse encephalopathy. In the setting of cardiac arrest, this EEG pattern is suggestive of anoxic/hypoxic brain injury.  1/5 - Low grade fever +. [place] 22.5K.  on Zosyn. On vent fio2 30%. TF +. Per RN - diprivan stopped yesterday. No myoclonus today. Mild tachycardia + Rpt pall care on 10/21/22 per wife 1/6 - On vent FiO2 30%.  Tmax 99,36F ., WBC better. Still off diprivan -> No myoclonus. cXR visualized - devices in place and faiure clear Oral oxy and klonpin stopped 1/8 No significant changes in neuro status. Tachycardic to 130s, hypertensive to 140s. Coreg transitioned to metoprolol. 1/11 wife considering trach/peg, to discuss with surgery-discussed with Dr. Bobbye Morton 1/12 more tachycardic 1/16-spoke with wife at length at bedside, Dr. Hulen Skains also had a long conversation with spouse-desires to continue current aggressive care and desires PEG and trach placed 1/18 perc trach and peg 11/05/2022 transferred to floor due to need for ICU bed 11/08/2022 transition to [facility] with pulmonary following weekly for trach  Subjective: Does not respond to me. He moves a bit at loud voice but didn't track or vocalize.   Objective: BP 115/71   Pulse (!) 111   Temp 99 F (37.2 C) (Axillary)   Resp (!) 24   Ht 5\' 4"  (1.626 m)   Wt 53.1 kg   SpO2 95%   BMI 20.08 kg/m   Gen: Ill-appearing male in no acute distress Neck: Tracheostomy in good position, no stridor, no exudate. Secretions minimal, thin, white, at time of exam this AM. Pulm: No crackles or wheezes  CV: Regular borderline tachycardia without JVD or edema. GI: Soft, nondistended, +BS. PEG  site c/d/i Neuro: Does not follow commands or respond meaningfully. No seizure-like activity grossly. Ext: Warm. Boots in place, BP cuff on L calf.    Assessment & Plan: Goals of Care: IPAL from 1/24 reviewed. He had been DNR, but transitioned back to full code. - Remains full code per extensive discussions with patient's spouse.  -  We will continue all aggressive medical work up/interventions as indicated.   Acute hypoxic respiratory failure, ARDS due to influenza A (+12/18) and superimposed streptococcal pneumonia: - s/p tamiflu 12/21-12/25 and ceftriaxone 12/20 - 12/26. Then had zosyn x7 days starting 12/31 per report. 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 Appreciate PCCM involvement for management, currently 6 cuffed. Site appears stable, oxygenation stable. Can consider transition to cuffless anytime after 11/16/2022. Last CXR 1/28 personally reviewed with good aeration and no acute abnormality. This is in agreement with radiology report.   PEA arrest:  - Continue cardiac monitoring, electrolyte monitoring.   Anoxic brain injury Myoclonic Seizure activity  Neurology discussed with family on 12/31. 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  Signed off 12/31. - Continue keppra, depakene   Recurrent Fevers  Leukocytosis: Intermittent fevers throughout hospital stay. Last true fever 1/25 while on cefepime (1/22 - 1/28). Respiratory cultures 1/1, 1/9, 1/13, 1/21 with normal flora. Blood cultures 12/26, 1/13, 1/21 with no growth, 1/29 NGTD Had coag negative staph at presentation, suspect that was contaminant. - Last fever 1/25, though continues low grade elevations and persistent leukocytosis that does not seem to track at all with fever curve or antibiotic administration. CRP reassuringly low on 1/29.  - No indwelling foley or central lines. Decubitus did not appear grossly infected, will have to monitor this and clinical status. Follow blood cultures from 1/29.    Elevated LFT's: Mild, AST 48, ALT 95 on last check which represents downward trend. - Monitor in intermittently.    Paroxysmal sympathetic hyperactivity: Causing tachycardia/hypotension.  - Continue propranolol and neurontin     Severe protein calorie malnutrition:   - Continue tube feeds thru PEG placed 11/01/2022. At goal rate and tolerating.    Prediabetes: HbA1c 5.9% on 12/20.  - SSI q4h    Pressure Ulcers Pressure Injury 10/12/22 Sacrum Mid;Upper Unstageable - Full thickness tissue loss in which the base of the injury is covered by slough (yellow, tan, gray, green or brown) and/or eschar (tan, brown or black) in the wound bed. (Active)  10/12/22 0200  Location: Sacrum  Location Orientation: Mid;Upper  Staging: Unstageable - Full thickness tissue loss in which the base of the injury is covered by slough (yellow, tan, gray, green or brown) and/or eschar (tan, brown or black) in the wound bed.  Wound Description (Comments):   Present on Admission: Yes     Pressure Injury 10/23/22 Ear Right;Upper Stage 2 -  Partial thickness loss of dermis presenting as a shallow open injury with a red, pink wound bed without slough. (Active)  10/23/22 2009  Location: Ear  Location Orientation: Right;Upper  Staging: Stage 2 -  Partial thickness loss of dermis presenting as a shallow open injury with a red, pink wound bed without slough.  Wound Description (Comments):   Present on Admission: No  - Continue local wound care. Reordered turn q2h for offloading. Optimize nutritional status.   Patrecia Pour, MD [facility] [email or web address] 11/14/2022, 1:19 PM

## 2022-11-15 DIAGNOSIS — J9601 Acute respiratory failure with hypoxia: Secondary | ICD-10-CM | POA: Diagnosis not present

## 2022-11-15 LAB — COMPREHENSIVE METABOLIC PANEL
ALT: 94 U/L — ABNORMAL HIGH (ref 0–44)
AST: 51 U/L — ABNORMAL HIGH (ref 15–41)
Albumin: 2.2 g/dL — ABNORMAL LOW (ref 3.5–5.0)
Alkaline Phosphatase: 97 U/L (ref 38–126)
Anion gap: 10 (ref 5–15)
BUN: 47 mg/dL — ABNORMAL HIGH (ref 6–20)
CO2: 25 mmol/L (ref 22–32)
Calcium: 9 mg/dL (ref 8.9–10.3)
Chloride: 102 mmol/L (ref 98–111)
Creatinine, Ser: 0.51 mg/dL — ABNORMAL LOW (ref 0.61–1.24)
GFR, Estimated: >60 mL/min (ref >60)
Glucose, Bld: 135 mg/dL — ABNORMAL HIGH (ref 70–99)
Potassium: 4.4 mmol/L (ref 3.5–5.1)
Sodium: 137 mmol/L (ref 135–145)
Total Bilirubin: 0.3 mg/dL (ref 0.3–1.2)
Total Protein: 7.4 g/dL (ref 6.5–8.1)

## 2022-11-15 LAB — CBC
HCT: 33.5 % — ABNORMAL LOW (ref 39.0–52.0)
Hemoglobin: 10.7 g/dL — ABNORMAL LOW (ref 13.0–17.0)
MCH: 28.5 pg (ref 26.0–34.0)
MCHC: 31.9 g/dL (ref 30.0–36.0)
MCV: 89.3 fL (ref 80.0–100.0)
Platelets: 333 10*3/uL (ref 150–400)
RBC: 3.75 MIL/uL — ABNORMAL LOW (ref 4.22–5.81)
RDW: 16.5 % — ABNORMAL HIGH (ref 11.5–15.5)
WBC: 17.2 10*3/uL — ABNORMAL HIGH (ref 4.0–10.5)
nRBC: 0.1 % (ref 0.0–0.2)

## 2022-11-15 LAB — GLUCOSE, CAPILLARY
Glucose-Capillary: 112 mg/dL — ABNORMAL HIGH (ref 70–99)
Glucose-Capillary: 115 mg/dL — ABNORMAL HIGH (ref 70–99)
Glucose-Capillary: 126 mg/dL — ABNORMAL HIGH (ref 70–99)
Glucose-Capillary: 134 mg/dL — ABNORMAL HIGH (ref 70–99)
Glucose-Capillary: 135 mg/dL — ABNORMAL HIGH (ref 70–99)
Glucose-Capillary: 148 mg/dL — ABNORMAL HIGH (ref 70–99)
Glucose-Capillary: 153 mg/dL — ABNORMAL HIGH (ref 70–99)

## 2022-11-15 NOTE — Progress Notes (Signed)
Chart reviewed-   No significant changes.  PMT will continue intermittent chart checks for now. Anticipate this is likely to become a long length of stay with disposition issues.   [name], AGNP-C Palliative Medicine  No charge

## 2022-11-15 NOTE — Progress Notes (Signed)
[facility] PROGRESS NOTE  Damon [name] (DOB: 10-May-1961) UVO:[id number] PCP: [facility]  Brief Narrative: Damon [name] is a 62 y.o. male with a history of HTN, HLD, tobacco use, chronic back pain who presented to the [facility] on 10/03/2022 with influenza A. He's had a protracted hospitalization summarized below.  Significant Events 12/18 Flu A positive 12/20 Admitted, bipap, 5 minute code MRSA PCR - neg Urine strep _POS 12/21 Intubated, paralyzed, ARDS protocol, on 7cc/kg 12/22 Weaned to 6 cc/kg with dyssynchrony, back on 7 cc/kg, driving pressures good, weaning vent 12/23 Attempt to wean sedation again with dyssynchrony and desaturation.  Some concern for cuff leak, tube exchange.  Cuff did not appear to be blown.  Ongoing signs of cuff leak, query leaking around cough, possible large trachea, diuresing 12/25 tolerating PSV, myoclonus> ceribell, Neuro consult, changed to propofol, diuresed  12/26 Changed to DNR. On Propofol, versed.  Tolerating PSV.  MRI brain with hypoxic/anoxic injury Blood culture neg 12/27 GPD's on EEG, pending transfer to [facility] for cEEG NEURO CONSULT 12/28 moved to [facility] for LTM EEG 12/31 family updated by neuro: no chance for meaningful recovery, they should make plans for comfort measures 1/1 cultures - resp normal flora 1/2 eeg still with epileptogenicity. Palli fam mtg -- family does not want to hear from medical teams unless there is "good news"  1/3  cEEG with burst suppression w highly epileptiform bursts.  1/4 cEEG with burst suppression  + highly epileptiform bursts still.  WBC slightly up to 22.5. Temp high 99 low 100  c-EEG stopped duie to lack of benefit Pallative care consult - wife upset about early dc from ER on 12/18 Chaplain consult: Wife is sufering from anticipatory grief + accepting god's way + curious/anger about events leading upto current state EEG: generalized epileptogenicity with high potential for seizures as well as  profound diffuse encephalopathy. In the setting of cardiac arrest, this EEG pattern is suggestive of anoxic/hypoxic brain injury.  1/5 - Low grade fever +. [place] 22.5K.  on Zosyn. On vent fio2 30%. TF +. Per RN - diprivan stopped yesterday. No myoclonus today. Mild tachycardia + Rpt pall care on 10/21/22 per wife 1/6 - On vent FiO2 30%.  Tmax 99,67F ., WBC better. Still off diprivan -> No myoclonus. cXR visualized - devices in place and faiure clear Oral oxy and klonpin stopped 1/8 No significant changes in neuro status. Tachycardic to 130s, hypertensive to 140s. Coreg transitioned to metoprolol. 1/11 wife considering trach/peg, to discuss with surgery-discussed with Dr. Bobbye Morton 1/12 more tachycardic 1/16-spoke with wife at length at bedside, Dr. Hulen Skains also had a long conversation with spouse-desires to continue current aggressive care and desires PEG and trach placed 1/18 perc trach and peg 11/05/2022 transferred to floor due to need for ICU bed 11/08/2022 transition to [facility] with pulmonary following weekly for trach  Subjective: No events noted per discussion with RN. Pt not verbally responsive.   Objective: BP 102/61   Pulse 96   Temp 98.8 F (37.1 C) (Axillary)   Resp (!) 21   Ht 5\' 4"  (1.626 m)   Wt 53.1 kg   SpO2 98%   BMI 20.09 kg/m   Gen: Ill-appearing male in no acute distress Pulm: Clear, nonlabored  CV: RRR, no pitting edema GI: Soft, NT, ND, +BS Neuro: Not meaningfully responsive. Ext: Warm, well-perfused Skin: Midline inferior sacral decubitus is pictured below without appreciable exudate, induration, or erythema.    Assessment & Plan: Goals of Care: IPAL  from 1/24 reviewed. He had been DNR, but transitioned back to full code. - Remains full code per extensive discussions with patient's spouse.  - We will continue all aggressive medical work up/interventions as indicated.   Acute hypoxic respiratory failure, ARDS due to influenza A (+12/18) and  superimposed streptococcal pneumonia: - s/p tamiflu 12/21-12/25 and ceftriaxone 12/20 - 12/26. Then had zosyn x7 days starting 12/31 per report. 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 Appreciate PCCM involvement for management, currently 6 cuffed. Site appears stable, oxygenation stable. Can consider transition to cuffless anytime after 11/16/2022. Last CXR 1/28 stable without acute abnormality.   PEA arrest:  - Continue cardiac monitoring, electrolyte monitoring.   Anoxic brain injury Myoclonic Seizure activity  Neurology discussed with family on 12/31. 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  Signed off 12/31. - Continue keppra, depakene   Recurrent Fevers  Leukocytosis: Intermittent fevers throughout hospital stay. Last true fever 1/25 while on cefepime (1/22 - 1/28). Respiratory cultures 1/1, 1/9, 1/13, 1/21 with normal flora. Blood cultures 12/26, 1/13, 1/21 with no growth, 1/29 NGTD Had coag negative staph at presentation, suspect that was contaminant. - Last fever 1/25, though continues low grade elevations and persistent leukocytosis that does not seem to track at all with fever curve or antibiotic administration. CRP reassuringly low on 1/29.  - No indwelling foley or central lines. Decubitus is not infected. Follow blood cultures from 1/29.    Elevated LFT's: Mild, AST 48, ALT 95 on last check which represents downward trend. - Monitor in intermittently.    Paroxysmal sympathetic hyperactivity: Causing tachycardia/hypotension.  - Continue propranolol and neurontin     Severe protein calorie malnutrition:  - Continue tube feeds thru PEG placed 11/01/2022. At goal rate and tolerating.    Prediabetes: HbA1c 5.9% on 12/20.  - SSI q4h    Pressure Ulcers Pressure Injury 10/12/22 Sacrum Mid;Upper Unstageable - Full thickness tissue loss in which the base of the injury is covered by slough  (yellow, tan, gray, green or brown) and/or eschar (tan, brown or black) in the wound bed. (Active)  10/12/22 0200  Location: Sacrum  Location Orientation: Mid;Upper  Staging: Unstageable - Full thickness tissue loss in which the base of the injury is covered by slough (yellow, tan, gray, green or brown) and/or eschar (tan, brown or black) in the wound bed.  Wound Description (Comments):   Present on Admission: Yes     Pressure Injury 10/23/22 Ear Right;Upper Stage 2 -  Partial thickness loss of dermis presenting as a shallow open injury with a red, pink wound bed without slough. (Active)  10/23/22 2009  Location: Ear  Location Orientation: Right;Upper  Staging: Stage 2 -  Partial thickness loss of dermis presenting as a shallow open injury with a red, pink wound bed without slough.  Wound Description (Comments):   Present on Admission: No  - Continue local wound care. Reordered turn q2h for offloading. Optimize nutritional status.   Patrecia Pour, MD [facility] [email or web address] 11/15/2022, 11:04 AM

## 2022-11-15 NOTE — Progress Notes (Signed)
Nutrition Follow-up  DOCUMENTATION CODES:   Not applicable  INTERVENTION:  Continue tube feeding via G-tube: -Osmolite 1.5 at 65 mL/hour (1560 mL goal daily volume) -PROSource TF20 60 mL BID per tube -Provides: 2500 kcal, 137 grams of protein, 1185 mL H2O daily  With free water flush of 150 mL every 4 hours per tube, pt receives a total of 2085 mL H2O daily including water in tube feeds.  Continue Juven 1 packet per tube BID. Each packet provides 95 kcal, 7 grams L-Arginine, 7 grams L-Glutamine, 2.5 grams collagen protein, 300 mg vitamin C, 9.5 mg zinc, and other micronutrients essential for wound healing.   Will discontinue Banatrol TF supplements at this time.  NUTRITION DIAGNOSIS:   Inadequate oral intake related to inability to eat as evidenced by NPO status.  Ongoing.  GOAL:   Patient will meet greater than or equal to 90% of their needs  Met with TF regimen.  MONITOR:   Labs, Weight trends, TF tolerance, Skin, I & O's  REASON FOR ASSESSMENT:   Consult Enteral/tube feeding initiation and management  ASSESSMENT:   Pt admitted from home with the flu leading to acute respiratory failure with hypoxia and PEA arrest upon admission. PMH significant for HTN, HLD, chronic back pain, tobacco use.  12/18: Flu A positive 12/20: admitted, 5 minute code after non-compliance with BiPAP 12/21: intubated, paralyzed, ARDS protocol 12/26: MRI brain with hypoxic/anoxic injury  12/28: [facility] 1/18: s/p tracheostomy tube placement and G-tube placement 1/21: tube feeds changed from Osmolite 1.5 to Vital 1.5 due to shortage of Osmolite 1.5 1/22: transfer from [facility] to [place]; rectal tube removed; free water flush reduced to 200 mL every 8 hours 1/24: water flushes changed to 150 mL every 4 hours   No family at bedside at time of RD assessment. Pt lying in bed. He is not verbally responsive. Currently on trach collar 6 L/min, 28% FiO2. Discussed with RN. Pt is tolerating  tube feeds well. He has not had a BM in approximately two days now. Previously having diarrhea but this is now resolved and rectal tube was removed on 1/22. Will discontinue Banatrol TF supplements at this time and will continue to monitor. Wound to sacrum appears overall stable per RN. Per wound documentation it is epithelialized.   Admission wt was 64.9 kg. Weights trended down this admission. Pt was 52.1 kg on 10/24/22 and 51.1 kg on 11/02/22. Calories were increased on 10/30/22. Most recent weight is 53.1 kg on 11/15/22.  Enteral Access: 24 Fr. G-tube placed 1/18  Tube feed regimen: Osmolite 1.5 at 65 mL/hour + PROSource TF 60 mL BID + free water flush 150 mL every 4 hours  Medications reviewed and include: famotidine, Banatrol TF 60 mL BID, gabapentin, Medihoney, Keppra, Juven BID, propranolol, valproic acid  Labs reviewed: CBG 115-153, BUN 47, Creatinine 0.51  UOP: 1100 mL (0.9 mL/kg/hr)  I/O: -2354.1 mL since 11/01/22  Diet Order:   Diet Order     None      EDUCATION NEEDS:   No education needs have been identified at this time  Skin:  Skin Assessment: Skin Integrity Issues: Skin Integrity Issues:: Stage II, Unstageable Stage II: right upper ear (1 cm x 0.5 cm) Unstageable: sacrum (8.5 cm x 1 cm x 0.5cm)  Last BM:  11/06/22 - medium type 6  Height:   Ht Readings from Last 1 Encounters:  10/11/22 5\' 4"  (1.626 m)   Weight:   Wt Readings from Last  1 Encounters:  11/15/22 53.1 kg   BMI:  Body mass index is 20.09 kg/m.  Estimated Nutritional Needs:   Kcal:  2400-2600  Protein:  120-140 grams  Fluid:  >/= 2 L/day  Loanne Drilling, MS, RD, LDN, CNSC Pager number available on Amion

## 2022-11-16 DIAGNOSIS — J9601 Acute respiratory failure with hypoxia: Secondary | ICD-10-CM | POA: Diagnosis not present

## 2022-11-16 LAB — GLUCOSE, CAPILLARY
Glucose-Capillary: 108 mg/dL — ABNORMAL HIGH (ref 70–99)
Glucose-Capillary: 110 mg/dL — ABNORMAL HIGH (ref 70–99)
Glucose-Capillary: 111 mg/dL — ABNORMAL HIGH (ref 70–99)
Glucose-Capillary: 122 mg/dL — ABNORMAL HIGH (ref 70–99)
Glucose-Capillary: 125 mg/dL — ABNORMAL HIGH (ref 70–99)
Glucose-Capillary: 144 mg/dL — ABNORMAL HIGH (ref 70–99)

## 2022-11-16 MED ORDER — SENNOSIDES 8.8 MG/5ML PO SYRP
10.0000 mL | ORAL_SOLUTION | Freq: Two times a day (BID) | ORAL | Status: DC
  Administered 2022-11-16 – 2022-12-05 (×36): 10 mL
  Filled 2022-11-16 (×52): qty 10

## 2022-11-16 NOTE — Progress Notes (Signed)
[facility] PROGRESS NOTE  Damon [name] (DOB: 1960/11/02) FYB:[id number] PCP: [facility]  Brief Narrative: Damon [name] is a 62 y.o. male with a history of HTN, HLD, tobacco use, chronic back pain who presented to the [facility] on 10/03/2022 with influenza A. He's had a protracted hospitalization summarized below.  Significant Events 12/18 Flu A positive 12/20 Admitted, bipap, 5 minute code MRSA PCR - neg Urine strep _POS 12/21 Intubated, paralyzed, ARDS protocol, on 7cc/kg 12/22 Weaned to 6 cc/kg with dyssynchrony, back on 7 cc/kg, driving pressures good, weaning vent 12/23 Attempt to wean sedation again with dyssynchrony and desaturation.  Some concern for cuff leak, tube exchange.  Cuff did not appear to be blown.  Ongoing signs of cuff leak, query leaking around cough, possible large trachea, diuresing 12/25 tolerating PSV, myoclonus> ceribell, Neuro consult, changed to propofol, diuresed  12/26 Changed to DNR. On Propofol, versed.  Tolerating PSV.  MRI brain with hypoxic/anoxic injury Blood culture neg 12/27 GPD's on EEG, pending transfer to [facility] for cEEG NEURO CONSULT 12/28 moved to [facility] for LTM EEG 12/31 family updated by neuro: no chance for meaningful recovery, they should make plans for comfort measures 1/1 cultures - resp normal flora 1/2 eeg still with epileptogenicity. Palli fam mtg -- family does not want to hear from medical teams unless there is "good news"  1/3  cEEG with burst suppression w highly epileptiform bursts.  1/4 cEEG with burst suppression  + highly epileptiform bursts still.  WBC slightly up to 22.5. Temp high 99 low 100  c-EEG stopped duie to lack of benefit Pallative care consult - wife upset about early dc from ER on 12/18 Chaplain consult: Wife is sufering from anticipatory grief + accepting god's way + curious/anger about events leading upto current state EEG: generalized epileptogenicity with high potential for seizures as well as  profound diffuse encephalopathy. In the setting of cardiac arrest, this EEG pattern is suggestive of anoxic/hypoxic brain injury.  1/5 - Low grade fever +. [place] 22.5K.  on Zosyn. On vent fio2 30%. TF +. Per RN - diprivan stopped yesterday. No myoclonus today. Mild tachycardia + Rpt pall care on 10/21/22 per wife 1/6 - On vent FiO2 30%.  Tmax 99,67F ., WBC better. Still off diprivan -> No myoclonus. cXR visualized - devices in place and faiure clear Oral oxy and klonpin stopped 1/8 No significant changes in neuro status. Tachycardic to 130s, hypertensive to 140s. Coreg transitioned to metoprolol. 1/11 wife considering trach/peg, to discuss with surgery-discussed with Dr. Bobbye Morton 1/12 more tachycardic 1/16-spoke with wife at length at bedside, Dr. Hulen Skains also had a long conversation with spouse-desires to continue current aggressive care and desires PEG and trach placed 1/18 perc trach and peg 11/05/2022 transferred to floor due to need for ICU bed 11/08/2022 [facility] to [facility] with pulmonary following weekly for trach  Subjective: No overnight events. Spouse concerned about decreased stool output.   Objective: BP 103/64   Pulse (!) 126   Temp 98.7 F (37.1 C) (Axillary)   Resp (!) 22   Ht 5\' 4"  (1.626 m)   Wt 54.9 kg   SpO2 100%   BMI 20.77 kg/m   No distress, not responsive Clear, nonlabored, mild white secretions from trach RRR, no MRG or edema Skin: Midline inferior sacral decubitus is pictured below from 2/1.   Assessment & Plan: Goals of Care: IPAL from 1/24 reviewed. He had been DNR, but transitioned back to full code. - Remains full code per  extensive discussions with patient's spouse.  - We will continue all aggressive medical work up/interventions as indicated.   Acute hypoxic respiratory failure, ARDS due to influenza A (+12/18) and superimposed streptococcal pneumonia: - s/p tamiflu 12/21-12/25 and ceftriaxone 12/20 - 12/26. Then had zosyn x7 days  starting 12/31 per report. 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 Appreciate PCCM involvement for management, currently 6 cuffed. Site appears stable, oxygenation stable. Can consider transition to cuffless anytime after 11/16/2022. Last CXR 1/28 stable without acute abnormality.   PEA arrest:  - Continue cardiac monitoring, electrolyte monitoring.   Anoxic brain injury Myoclonic Seizure activity  Neurology discussed with family on 12/31. 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  Signed off 12/31. - Continue keppra, depakene   Recurrent Fevers  Leukocytosis: Intermittent fevers throughout hospital stay. Last true fever 1/25 while on cefepime (1/22 - 1/28). Respiratory cultures 1/1, 1/9, 1/13, 1/21 with normal flora. Blood cultures 12/26, 1/13, 1/21 with no growth, 1/29 NGTD Had coag negative staph at presentation, suspect that was contaminant. - Last fever 1/25, though continues low grade elevations and persistent leukocytosis that does not seem to track at all with fever curve or antibiotic administration. CRP reassuringly low on 1/29.  - No indwelling foley or central lines. Decubitus is not infected. Follow blood cultures from 1/29, remain no growth.   Constipation:  Mild, but will start regular regimen. Abd benign. - Start senokot scheduled and monitor.   Elevated LFT's: Mild, AST 48, ALT 95 on last check which represents downward trend. - Monitor in intermittently.    Paroxysmal sympathetic hyperactivity: Causing tachycardia/hypotension.  - Continue propranolol and neurontin     Severe protein calorie malnutrition:  - Continue tube feeds thru PEG placed 11/01/2022. At goal rate and tolerating.    Prediabetes: HbA1c 5.9% on 12/20.  - SSI q4h    Pressure Ulcers Pressure Injury 10/12/22 Sacrum Mid;Upper Unstageable - Full thickness tissue loss in which the base of the injury is covered by  slough (yellow, tan, gray, green or brown) and/or eschar (tan, brown or black) in the wound bed. (Active)  10/12/22 0200  Location: Sacrum  Location Orientation: Mid;Upper  Staging: Unstageable - Full thickness tissue loss in which the base of the injury is covered by slough (yellow, tan, gray, green or brown) and/or eschar (tan, brown or black) in the wound bed.  Wound Description (Comments):   Present on Admission: Yes     Pressure Injury 10/23/22 Ear Right;Upper Stage 2 -  Partial thickness loss of dermis presenting as a shallow open injury with a red, pink wound bed without slough. (Active)  10/23/22 2009  Location: Ear  Location Orientation: Right;Upper  Staging: Stage 2 -  Partial thickness loss of dermis presenting as a shallow open injury with a red, pink wound bed without slough.  Wound Description (Comments):   Present on Admission: No  - Continue local wound care. Reordered turn q2h for offloading. Optimize nutritional status.   Patrecia Pour, MD [facility] [email or web address] 11/16/2022, 2:40 PM

## 2022-11-17 ENCOUNTER — Inpatient Hospital Stay (HOSPITAL_COMMUNITY): Payer: Medicare Other

## 2022-11-17 DIAGNOSIS — J9601 Acute respiratory failure with hypoxia: Secondary | ICD-10-CM | POA: Diagnosis not present

## 2022-11-17 LAB — CULTURE, BLOOD (ROUTINE X 2)
Culture: NO GROWTH
Culture: NO GROWTH
Special Requests: ADEQUATE

## 2022-11-17 LAB — COMPREHENSIVE METABOLIC PANEL
ALT: 108 U/L — ABNORMAL HIGH (ref 0–44)
AST: 53 U/L — ABNORMAL HIGH (ref 15–41)
Albumin: 2.4 g/dL — ABNORMAL LOW (ref 3.5–5.0)
Alkaline Phosphatase: 92 U/L (ref 38–126)
Anion gap: 8 (ref 5–15)
BUN: 59 mg/dL — ABNORMAL HIGH (ref 6–20)
CO2: 25 mmol/L (ref 22–32)
Calcium: 9 mg/dL (ref 8.9–10.3)
Chloride: 107 mmol/L (ref 98–111)
Creatinine, Ser: 0.53 mg/dL — ABNORMAL LOW (ref 0.61–1.24)
GFR, Estimated: >60 mL/min (ref >60)
Glucose, Bld: 128 mg/dL — ABNORMAL HIGH (ref 70–99)
Potassium: 4.5 mmol/L (ref 3.5–5.1)
Sodium: 140 mmol/L (ref 135–145)
Total Bilirubin: 0.5 mg/dL (ref 0.3–1.2)
Total Protein: 7.3 g/dL (ref 6.5–8.1)

## 2022-11-17 LAB — CBC
HCT: 34.5 % — ABNORMAL LOW (ref 39.0–52.0)
Hemoglobin: 10.9 g/dL — ABNORMAL LOW (ref 13.0–17.0)
MCH: 28.6 pg (ref 26.0–34.0)
MCHC: 31.6 g/dL (ref 30.0–36.0)
MCV: 90.6 fL (ref 80.0–100.0)
Platelets: 330 10*3/uL (ref 150–400)
RBC: 3.81 MIL/uL — ABNORMAL LOW (ref 4.22–5.81)
RDW: 16.6 % — ABNORMAL HIGH (ref 11.5–15.5)
WBC: 14.4 10*3/uL — ABNORMAL HIGH (ref 4.0–10.5)
nRBC: 0.2 % (ref 0.0–0.2)

## 2022-11-17 LAB — GLUCOSE, CAPILLARY
Glucose-Capillary: 154 mg/dL — ABNORMAL HIGH (ref 70–99)
Glucose-Capillary: 143 mg/dL — ABNORMAL HIGH (ref 70–99)
Glucose-Capillary: 149 mg/dL — ABNORMAL HIGH (ref 70–99)
Glucose-Capillary: 119 mg/dL — ABNORMAL HIGH (ref 70–99)
Glucose-Capillary: 134 mg/dL — ABNORMAL HIGH (ref 70–99)

## 2022-11-17 MED ORDER — BISACODYL 10 MG RE SUPP
10.0000 mg | Freq: Once | RECTAL | Status: AC
  Administered 2022-11-17: 10 mg via RECTAL
  Filled 2022-11-17: qty 1

## 2022-11-17 NOTE — Progress Notes (Signed)
[facility] PROGRESS NOTE  Damon [name] (DOB: 09/20/1961) NGE:[id number] PCP: [facility]  Brief Narrative: Damon [name] is a 62 y.o. male with a history of HTN, HLD, tobacco use, chronic back pain who presented to the [facility] on 10/03/2022 with influenza A. He's had a protracted hospitalization summarized below.  Significant Events 12/18 Flu A positive 12/20 Admitted, bipap, 5 minute code MRSA PCR - neg Urine strep _POS 12/21 Intubated, paralyzed, ARDS protocol, on 7cc/kg 12/22 Weaned to 6 cc/kg with dyssynchrony, back on 7 cc/kg, driving pressures good, weaning vent 12/23 Attempt to wean sedation again with dyssynchrony and desaturation.  Some concern for cuff leak, tube exchange.  Cuff did not appear to be blown.  Ongoing signs of cuff leak, query leaking around cough, possible large trachea, diuresing 12/25 tolerating PSV, myoclonus> ceribell, Neuro consult, changed to propofol, diuresed  12/26 Changed to DNR. On Propofol, versed.  Tolerating PSV.  MRI brain with hypoxic/anoxic injury Blood culture neg 12/27 GPD's on EEG, pending transfer to [facility] for cEEG NEURO CONSULT 12/28 moved to [facility] for LTM EEG 12/31 family updated by neuro: no chance for meaningful recovery, they should make plans for comfort measures 1/1 cultures - resp normal flora 1/2 eeg still with epileptogenicity. Palli fam mtg -- family does not want to hear from medical teams unless there is "good news"  1/3  cEEG with burst suppression w highly epileptiform bursts.  1/4 cEEG with burst suppression  + highly epileptiform bursts still.  WBC slightly up to 22.5. Temp high 99 low 100  c-EEG stopped duie to lack of benefit Pallative care consult - wife upset about early dc from ER on 12/18 Chaplain consult: Wife is sufering from anticipatory grief + accepting god's way + curious/anger about events leading upto current state EEG: generalized epileptogenicity with high potential for seizures as well as  profound diffuse encephalopathy. In the setting of cardiac arrest, this EEG pattern is suggestive of anoxic/hypoxic brain injury.  1/5 - Low grade fever +. [place] 22.5K.  on Zosyn. On vent fio2 30%. TF +. Per RN - diprivan stopped yesterday. No myoclonus today. Mild tachycardia + Rpt pall care on 10/21/22 per wife 1/6 - On vent FiO2 30%.  Tmax 99,6F ., WBC better. Still off diprivan -> No myoclonus. cXR visualized - devices in place and faiure clear Oral oxy and klonpin stopped 1/8 No significant changes in neuro status. Tachycardic to 130s, hypertensive to 140s. Coreg transitioned to metoprolol. 1/11 wife considering trach/peg, to discuss with surgery-discussed with Dr. Bobbye Morton 1/12 more tachycardic 1/16-spoke with wife at length at bedside, Dr. Hulen Skains also had a long conversation with spouse-desires to continue current aggressive care and desires PEG and trach placed 1/18 perc trach and peg 11/05/2022 transferred to floor due to need for ICU bed 11/08/2022 transition to [facility] with pulmonary following weekly for trach  Subjective: No BM overnight. No other issues reported per RN about the patient.  Objective: BP 125/78   Pulse 97   Temp 98.6 F (37 C) (Axillary)   Resp (!) 22   Ht 5\' 4"  (1.626 m)   Wt 54.9 kg   SpO2 100%   BMI 20.77 kg/m   Unresponsive Abd soft, nondistended, hypoactive bowel sounds. PEG site c/d/i Regular tachycardia without MRG or pitting LE edema Skin: Midline inferior sacral decubitus is pictured below from 2/1. Not reexamined as family present today.   Assessment & Plan: Goals of Care: IPAL from 1/24 reviewed. He had been DNR, but transitioned  back to full code. - Remains full code per extensive discussions with patient's spouse.  - We will continue all aggressive medical work up/interventions as indicated.   Acute hypoxic respiratory failure, ARDS due to influenza A (+12/18) and superimposed streptococcal pneumonia: - s/p tamiflu 12/21-12/25  and ceftriaxone 12/20 - 12/26. Then had zosyn x7 days starting 12/31 per report. 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 Appreciate PCCM involvement for management, currently 6 cuffed. Site appears stable, oxygenation stable. Can consider transition to cuffless anytime after 11/16/2022. Last CXR 1/28 stable without acute abnormality.   PEA arrest:  - Continue cardiac monitoring, electrolyte monitoring.   Anoxic brain injury Myoclonic Seizure activity  Neurology discussed with family on 12/31. 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  Signed off 12/31. - Continue keppra, depakene   Recurrent Fevers  Leukocytosis: Intermittent fevers throughout hospital stay. Last true fever 1/25 while on cefepime (1/22 - 1/28). Respiratory cultures 1/1, 1/9, 1/13, 1/21 with normal flora. Blood cultures 12/26, 1/13, 1/21 with no growth, 1/29 NGTD Had coag negative staph at presentation, suspect that was contaminant. - Continues low grade elevations and persistent leukocytosis though this has consistently decreased off antimicrobial therapy. 14.4k today.  - No indwelling foley or central lines. Decubitus is not infected. Follow blood cultures from 1/29, remain no growth.    Constipation: Abd remains soft and nondistended, though bowel sounds decreased.  - Continue scheduled senokot.  - Check port KUB.   Elevated LFT's: Mild, AST 48, ALT 95 on last check which represents downward trend. - Monitor in intermittently.    Paroxysmal sympathetic hyperactivity: Causing tachycardia/hypotension.  - Continue propranolol and neurontin     Severe protein calorie malnutrition:  - Continue tube feeds thru PEG placed 11/01/2022. At goal rate and tolerating.    Prediabetes: HbA1c 5.9% on 12/20.  - SSI q4h    Pressure Ulcers Pressure Injury 10/12/22 Sacrum Mid;Upper Unstageable - Full thickness tissue loss in which the base of  the injury is covered by slough (yellow, tan, gray, green or brown) and/or eschar (tan, brown or black) in the wound bed. (Active)  10/12/22 0200  Location: Sacrum  Location Orientation: Mid;Upper  Staging: Unstageable - Full thickness tissue loss in which the base of the injury is covered by slough (yellow, tan, gray, green or brown) and/or eschar (tan, brown or black) in the wound bed.  Wound Description (Comments):   Present on Admission: Yes     Pressure Injury 10/23/22 Ear Right;Upper Stage 2 -  Partial thickness loss of dermis presenting as a shallow open injury with a red, pink wound bed without slough. (Active)  10/23/22 2009  Location: Ear  Location Orientation: Right;Upper  Staging: Stage 2 -  Partial thickness loss of dermis presenting as a shallow open injury with a red, pink wound bed without slough.  Wound Description (Comments):   Present on Admission: No  - Continue local wound care. Reordered turn q2h for offloading. Optimize nutritional status.   Patrecia Pour, MD [facility] [email or web address] 11/17/2022, 1:12 PM

## 2022-11-18 DIAGNOSIS — J9601 Acute respiratory failure with hypoxia: Secondary | ICD-10-CM | POA: Diagnosis not present

## 2022-11-18 LAB — BASIC METABOLIC PANEL
Anion gap: 10 (ref 5–15)
BUN: 81 mg/dL — ABNORMAL HIGH (ref 6–20)
CO2: 26 mmol/L (ref 22–32)
Calcium: 9.3 mg/dL (ref 8.9–10.3)
Chloride: 110 mmol/L (ref 98–111)
Creatinine, Ser: 0.78 mg/dL (ref 0.61–1.24)
GFR, Estimated: >60 mL/min (ref >60)
Glucose, Bld: 120 mg/dL — ABNORMAL HIGH (ref 70–99)
Potassium: 4.4 mmol/L (ref 3.5–5.1)
Sodium: 146 mmol/L — ABNORMAL HIGH (ref 135–145)

## 2022-11-18 LAB — CBC
HCT: 34.2 % — ABNORMAL LOW (ref 39.0–52.0)
Hemoglobin: 10.9 g/dL — ABNORMAL LOW (ref 13.0–17.0)
MCH: 29.1 pg (ref 26.0–34.0)
MCHC: 31.9 g/dL (ref 30.0–36.0)
MCV: 91.2 fL (ref 80.0–100.0)
Platelets: 329 10*3/uL (ref 150–400)
RBC: 3.75 MIL/uL — ABNORMAL LOW (ref 4.22–5.81)
RDW: 16.7 % — ABNORMAL HIGH (ref 11.5–15.5)
WBC: 12.7 10*3/uL — ABNORMAL HIGH (ref 4.0–10.5)
nRBC: 0.2 % (ref 0.0–0.2)

## 2022-11-18 LAB — GLUCOSE, CAPILLARY
Glucose-Capillary: 102 mg/dL — ABNORMAL HIGH (ref 70–99)
Glucose-Capillary: 108 mg/dL — ABNORMAL HIGH (ref 70–99)
Glucose-Capillary: 125 mg/dL — ABNORMAL HIGH (ref 70–99)
Glucose-Capillary: 130 mg/dL — ABNORMAL HIGH (ref 70–99)
Glucose-Capillary: 131 mg/dL — ABNORMAL HIGH (ref 70–99)

## 2022-11-18 NOTE — Progress Notes (Addendum)
[facility] PROGRESS NOTE  Damon [name] (DOB: Aug 29, 1961) HQI:[id number] PCP: [facility]  Brief Narrative: Damon [name] is a 62 y.o. male with a history of HTN, HLD, tobacco use, chronic back pain who presented to the [facility] on 10/03/2022 with influenza A. He's had a protracted hospitalization summarized below.  Significant Events 12/18 Flu A positive 12/20 Admitted, bipap, 5 minute code MRSA PCR - neg Urine strep _POS 12/21 Intubated, paralyzed, ARDS protocol, on 7cc/kg 12/22 Weaned to 6 cc/kg with dyssynchrony, back on 7 cc/kg, driving pressures good, weaning vent 12/23 Attempt to wean sedation again with dyssynchrony and desaturation.  Some concern for cuff leak, tube exchange.  Cuff did not appear to be blown.  Ongoing signs of cuff leak, query leaking around cough, possible large trachea, diuresing 12/25 tolerating PSV, myoclonus> ceribell, Neuro consult, changed to propofol, diuresed  12/26 Changed to DNR. On Propofol, versed.  Tolerating PSV.  MRI brain with hypoxic/anoxic injury Blood culture neg 12/27 GPD's on EEG, pending transfer to [facility] for cEEG NEURO CONSULT 12/28 moved to [facility] for LTM EEG 12/31 family updated by neuro: no chance for meaningful recovery, they should make plans for comfort measures 1/1 cultures - resp normal flora 1/2 eeg still with epileptogenicity. Palli fam mtg -- family does not want to hear from medical teams unless there is "good news"  1/3  cEEG with burst suppression w highly epileptiform bursts.  1/4 cEEG with burst suppression  + highly epileptiform bursts still.  WBC slightly up to 22.5. Temp high 99 low 100  c-EEG stopped duie to lack of benefit Pallative care consult - wife upset about early dc from ER on 12/18 Chaplain consult: Wife is sufering from anticipatory grief + accepting god's way + curious/anger about events leading upto current state EEG: generalized epileptogenicity with high potential for seizures as well as  profound diffuse encephalopathy. In the setting of cardiac arrest, this EEG pattern is suggestive of anoxic/hypoxic brain injury.  1/5 - Low grade fever +. [place] 22.5K.  on Zosyn. On vent fio2 30%. TF +. Per RN - diprivan stopped yesterday. No myoclonus today. Mild tachycardia + Rpt pall care on 10/21/22 per wife 1/6 - On vent FiO2 30%.  Tmax 99,26F ., WBC better. Still off diprivan -> No myoclonus. cXR visualized - devices in place and faiure clear Oral oxy and klonpin stopped 1/8 No significant changes in neuro status. Tachycardic to 130s, hypertensive to 140s. Coreg transitioned to metoprolol. 1/11 wife considering trach/peg, to discuss with surgery-discussed with Dr. Bobbye Morton 1/12 more tachycardic 1/16-spoke with wife at length at bedside, Dr. Hulen Skains also had a long conversation with spouse-desires to continue current aggressive care and desires PEG and trach placed 1/18 perc trach and peg 11/05/2022 transferred to floor due to need for ICU bed 11/08/2022 transition to [facility] with pulmonary following weekly for trach 2/4 recurrent fever, cultures collected.  Subjective: +BM yesterday, no other overnight events noted  Objective: BP 125/78   Pulse 97   Temp 98.6 F (37 C) (Axillary)   Resp (!) 22   Ht 5\' 4"  (1.626 m)   Wt 54.9 kg   SpO2 100%   BMI 20.77 kg/m   Not responsive, does not track.  Clear, nonlabored thru trach Regular tachycardia, no edema PEG site c/d/I without discharge, erythema, induration. +BS, soft, NT, ND Skin: Midline inferior sacral decubitus is pictured below from 2/1. No appreciable change on brief exam today.   Assessment & Plan: Goals of Care: IPAL from  1/24 reviewed. He had been DNR, but transitioned back to full code. - Remains full code per extensive discussions with patient's spouse.  - We will continue all aggressive medical work up/interventions as indicated.   Acute hypoxic respiratory failure, ARDS due to influenza A (+12/18) and  superimposed Pneumococcal pneumonia: - s/p tamiflu 12/21-12/25 and ceftriaxone 12/20 - 12/26. Then had zosyn x7 days starting 12/31 per report. 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 Appreciate PCCM involvement for management, currently 6 cuffed. Site appears stable, oxygenation stable. Can consider transition to cuffless anytime after 11/16/2022. Last CXR 1/28 stable without acute abnormality.   PEA arrest:  - Continue cardiac monitoring, electrolyte monitoring.   Anoxic brain injury Myoclonic Seizure activity  Neurology discussed with family on 12/31. 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  Signed off 12/31. - Continue keppra, depakene   Recurrent Fevers  Leukocytosis: Intermittent fevers throughout hospital stay. Again febrile 2/4. No change to exam of wound or lungs, no change to oxygenation. Last true fever 1/25 while on cefepime (1/22 - 1/28). Respiratory cultures 1/1, 1/9, 1/13, 1/21 with normal flora. Blood cultures 12/26, 1/13, 1/21, 1/29 no growth. Had coag negative staph at presentation, suspect that was contaminant. - Continues low grade elevations and persistent leukocytosis though this has consistently decreased off antimicrobial therapy. Recheck CBC.  - No indwelling foley or central lines. Decubitus is not infected. Will repeat blood cultures today but monitor off abx at this time.   Constipation: Abd remains soft and nondistended, XR 2/3 showed no ileus/obstruction.   - Continue scheduled senokot. Hd BM with suppository 2/3.  Elevated LFT's: Mild, AST 48, ALT 95 on last check which represents downward trend. - Monitor in intermittently.    Paroxysmal sympathetic hyperactivity: Causing tachycardia/hypotension.  - Continue propranolol and neurontin     Severe protein calorie malnutrition:  - Continue tube feeds thru PEG placed 11/01/2022. At goal rate and tolerating.     Prediabetes: HbA1c 5.9% on 12/20.  - SSI q4h    Pressure Ulcers Pressure Injury 10/12/22 Sacrum Mid;Upper Unstageable - Full thickness tissue loss in which the base of the injury is covered by slough (yellow, tan, gray, green or brown) and/or eschar (tan, brown or black) in the wound bed. (Active)  10/12/22 0200  Location: Sacrum  Location Orientation: Mid;Upper  Staging: Unstageable - Full thickness tissue loss in which the base of the injury is covered by slough (yellow, tan, gray, green or brown) and/or eschar (tan, brown or black) in the wound bed.  Wound Description (Comments):   Present on Admission: Yes     Pressure Injury 10/23/22 Ear Right;Upper Stage 2 -  Partial thickness loss of dermis presenting as a shallow open injury with a red, pink wound bed without slough. (Active)  10/23/22 2009  Location: Ear  Location Orientation: Right;Upper  Staging: Stage 2 -  Partial thickness loss of dermis presenting as a shallow open injury with a red, pink wound bed without slough.  Wound Description (Comments):   Present on Admission: No  - Continue local wound care. Reordered turn q2h for offloading. Optimize nutritional status.   Patrecia Pour, MD [facility] [email or web address] 11/17/2022, 1:12 PM

## 2022-11-19 DIAGNOSIS — G931 Anoxic brain damage, not elsewhere classified: Secondary | ICD-10-CM | POA: Diagnosis not present

## 2022-11-19 DIAGNOSIS — J9601 Acute respiratory failure with hypoxia: Secondary | ICD-10-CM | POA: Diagnosis not present

## 2022-11-19 LAB — GLUCOSE, CAPILLARY
Glucose-Capillary: 118 mg/dL — ABNORMAL HIGH (ref 70–99)
Glucose-Capillary: 121 mg/dL — ABNORMAL HIGH (ref 70–99)
Glucose-Capillary: 125 mg/dL — ABNORMAL HIGH (ref 70–99)
Glucose-Capillary: 138 mg/dL — ABNORMAL HIGH (ref 70–99)
Glucose-Capillary: 138 mg/dL — ABNORMAL HIGH (ref 70–99)
Glucose-Capillary: 150 mg/dL — ABNORMAL HIGH (ref 70–99)

## 2022-11-19 MED ORDER — FREE WATER
200.0000 mL | Status: DC
  Administered 2022-11-19 – 2022-11-21 (×12): 200 mL

## 2022-11-19 NOTE — Procedures (Signed)
Tracheostomy Change Note  Patient Details:   Name: Lum Stillinger DOB: 03/07/1961 MRN: [id number]    Airway Documentation:     Evaluation  O2 sats: stable throughout Complications: No apparent complications Patient did tolerate procedure well. Bilateral Breath Sounds: Diminished   Lurline Idol was changed to a #6 cuffless [place] with 2 RT's without any complications. Lurline Idol was secured with trach ties. Positive color changed noted on CO2 detector.   Claretta Fraise 11/19/2022, 4:02 PM

## 2022-11-19 NOTE — Progress Notes (Signed)
[facility] PROGRESS NOTE  Aundrea Higginbotham (DOB: 07-11-1961) DVV:[id number] PCP: [facility]  Brief Narrative: Damon [name] is a 62 y.o. male with a history of HTN, HLD, tobacco use, chronic back pain who presented to the [facility] on 10/03/2022 with influenza A. He's had a protracted hospitalization summarized below.  Significant Events 12/18 Flu A positive 12/20 Admitted, bipap, 5 minute code MRSA PCR - neg Urine strep _POS 12/21 Intubated, paralyzed, ARDS protocol, on 7cc/kg 12/22 Weaned to 6 cc/kg with dyssynchrony, back on 7 cc/kg, driving pressures good, weaning vent 12/23 Attempt to wean sedation again with dyssynchrony and desaturation.  Some concern for cuff leak, tube exchange.  Cuff did not appear to be blown.  Ongoing signs of cuff leak, query leaking around cough, possible large trachea, diuresing 12/25 tolerating PSV, myoclonus> ceribell, Neuro consult, changed to propofol, diuresed  12/26 Changed to DNR. On Propofol, versed.  Tolerating PSV.  MRI brain with hypoxic/anoxic injury Blood culture neg 12/27 GPD's on EEG, pending transfer to [facility] for cEEG NEURO CONSULT 12/28 moved to [facility] for LTM EEG 12/31 family updated by neuro: no chance for meaningful recovery, they should make plans for comfort measures 1/1 cultures - resp normal flora 1/2 eeg still with epileptogenicity. Palli fam mtg -- family does not want to hear from medical teams unless there is "good news"  1/3  cEEG with burst suppression w highly epileptiform bursts.  1/4 cEEG with burst suppression  + highly epileptiform bursts still.  WBC slightly up to 22.5. Temp high 99 low 100  c-EEG stopped duie to lack of benefit Pallative care consult - wife upset about early dc from ER on 12/18 Chaplain consult: Wife is sufering from anticipatory grief + accepting god's way + curious/anger about events leading upto current state EEG: generalized epileptogenicity with high potential for seizures as well as  profound diffuse encephalopathy. In the setting of cardiac arrest, this EEG pattern is suggestive of anoxic/hypoxic brain injury.  1/5 - Low grade fever +. [place] 22.5K.  on Zosyn. On vent fio2 30%. TF +. Per RN - diprivan stopped yesterday. No myoclonus today. Mild tachycardia + Rpt pall care on 10/21/22 per wife 1/6 - On vent FiO2 30%.  Tmax 99,59F ., WBC better. Still off diprivan -> No myoclonus. cXR visualized - devices in place and faiure clear Oral oxy and klonpin stopped 1/8 No significant changes in neuro status. Tachycardic to 130s, hypertensive to 140s. Coreg transitioned to metoprolol. 1/11 wife considering trach/peg, to discuss with surgery-discussed with Dr. Bobbye Morton 1/12 more tachycardic 1/16-spoke with wife at length at bedside, Dr. Hulen Skains also had a long conversation with spouse-desires to continue current aggressive care and desires PEG and trach placed 1/18 perc trach and peg 11/05/2022 transferred to floor due to need for ICU bed 11/08/2022 transition to [facility] with pulmonary following weekly for trach 2/4 recurrent fever, cultures collected.  Subjective: No overnight events reported. No further fevers after yesterday afternoon. No changes in oxygenation/respiratory status. Of note, I came back to speak with spouse at bedside yesterday evening for about 30 minutes. We discussed her frustration and distrust of [facility] as a whole, wants answers from [facility] about what happened there. She says she's being lied to about the patient's wound and when he's getting bathed, citing that he didn't smell fresh. She voiced frustrations at perceived lack of communication (e.g. was not told when patient was transferred 4E > 6E until she called here). I showed her the picture  of the wound to avoid any misunderstanding and offered absolutely sincere understanding that this is a terrible situation for her and the person she loves the most. I asked what decisions she specifically  would want communicated to her. For example, when I ordered the stool softener, we had spoken about it previously. When I ordered the suppository, we had not. She was ok with that and similar smaller management decisions.   Her distrust extends on the system level of [place] and "everybody that walks through that door." Distrust of physicians stems from her perceived incongruence of being told that "he'll never open his eyes again," and that he does now open his eyes. She believes some physicians are trying to play God. I just listened and offered to help however I can.  Objective: BP 117/75 (BP Location: Left Leg)   Pulse (!) 116   Temp 99.9 F (37.7 C) (Axillary)   Resp 16   Ht 5\' 4"  (1.626 m)   Wt 52.2 kg   SpO2 99%   BMI 19.74 kg/m   Again not responsive, eyes closed.  Respirations are nonlabored and lungs are clear Regular tachycardia without JVD, MRG, or edema Abd is soft, ND, +BS with c/d/I PEG site.  Condom catheter in good position without noted skin breakdown.  Sacral foam pad dressing is c/d/i  Assessment & Plan: Goals of Care: IPAL from 1/24 reviewed. He had been DNR, but transitioned back to full code. - Remains full code per extensive discussions with patient's spouse.  - We will continue all aggressive medical work up/interventions as indicated. - It will be important to be specific about clinical details when communicating with spouse. Would not generalize or offer any statement that could be construed as misleading.    Acute hypoxic respiratory failure, ARDS due to influenza A (+12/18) and superimposed Pneumococcal pneumonia: - s/p tamiflu 12/21-12/25 and ceftriaxone 12/20 - 12/26. Then had zosyn x7 days starting 12/31 per report. 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 Appreciate PCCM involvement for management, currently 6 cuffed. Site appears stable, oxygenation stable. Can consider transition to cuffless anytime after 11/16/2022 per PCCM, anticipate  their reassessment soon (~weekly). Last CXR 1/28 stable without acute abnormality.   PEA arrest:  - Continue cardiac monitoring, electrolyte monitoring.   Anoxic brain injury Myoclonic Seizure activity  Neurology discussed with family on 12/31. 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  Signed off 12/31. - Continue keppra, depakene   Recurrent Fevers  Leukocytosis: Intermittent fevers throughout hospital stay. Again febrile 2/4. No change to exam of wound or lungs, no change to oxygenation. Last true fever 1/25 while on cefepime (1/22 - 1/28). Respiratory cultures 1/1, 1/9, 1/13, 1/21 with normal flora. Blood cultures 12/26, 1/13, 1/21, 1/29 no growth. Had coag negative staph at presentation, suspect that was contaminant. - Continues low grade elevations and persistent leukocytosis though this has consistently decreased off antimicrobial therapy. Had fever 2/4, cultures pending (NGTD), and CBC showed continued decrease in WBC to 12.7. - No indwelling foley or central lines. Decubitus is not infected. Will repeat blood cultures today but monitor off abx at this time.   Constipation: Abd remains soft and nondistended, XR 2/3 showed no ileus/obstruction.   - Continue scheduled senokot. Had BM with suppository 2/3.  Elevated LFT's: Mild, stable. No cholestatic elevations. - Monitor in intermittently.    Paroxysmal sympathetic hyperactivity: Causing tachycardia/hypotension.  - Continue propranolol and neurontin     Severe protein calorie  malnutrition:  - Continue tube feeds thru PEG placed 11/01/2022. At goal rate and tolerating.    Prediabetes: HbA1c 5.9% on 12/20.  - SSI q4h   Hypernatremia: Mild, suspect increased insensible losses with occasional fever.  - Increase free water to 269ml q4h.   Pressure Ulcers Pressure Injury 10/12/22 Sacrum Mid;Upper Unstageable - Full thickness tissue loss in which the  base of the injury is covered by slough (yellow, tan, gray, green or brown) and/or eschar (tan, brown or black) in the wound bed. (Active)  10/12/22 0200  Location: Sacrum  Location Orientation: Mid;Upper  Staging: Unstageable - Full thickness tissue loss in which the base of the injury is covered by slough (yellow, tan, gray, green or brown) and/or eschar (tan, brown or black) in the wound bed.  Wound Description (Comments):   Present on Admission: Yes     Pressure Injury 10/23/22 Ear Right;Upper Stage 2 -  Partial thickness loss of dermis presenting as a shallow open injury with a red, pink wound bed without slough. (Active)  10/23/22 2009  Location: Ear  Location Orientation: Right;Upper  Staging: Stage 2 -  Partial thickness loss of dermis presenting as a shallow open injury with a red, pink wound bed without slough.  Wound Description (Comments):   Present on Admission: No  - Continue local wound care. Reordered turn q2h for offloading. Optimize nutritional status.   Patrecia Pour, MD [facility] [email or web address] 11/19/2022, 8:34 AM

## 2022-11-19 NOTE — Progress Notes (Signed)
NAME:  Damon [name], MRN:  [id number], DOB:  19-Jan-1961, LOS: 17 ADMISSION DATE:  10/03/2022, CONSULTATION DATE:  10/03/2022 REFERRING MD:  Marylyn Ishihara - [facility] CHIEF COMPLAINT:  Dyspnea   History of Present Illness:  62 year old man admitted to [facility] 12/20 in the setting of acute hypoxemic respiratory failure due to Flu A. PMHx significant for HTN, HLD, chronic back pain, tobacco abuse   Placed on BIPAP.  Coded (likely in the setting of hypoxia), initial rhythm PEA with CPR x 5 minutes, required intubation.  After several days on mechanical ventilation remains comatose, MRI Brain 12/26 worrisome for anoxic brain injury.  Transferred to [facility] for LTM EEG monitoring.  Neurology consulted. LTM with generalized epileptogenicity. Treated with Propofol, Depakote, Keppra, Ketamine. 12/31 Neurology with goals of care with family. Relayed that patient has a grim neurological recovery due to irreversible anoxic injury. Family wished to continue aggressive measures.   Palliative care consulted. Stay complicated with neuro-storming.   Surgical consult regarding PEG/trach  Pertinent Medical History:   Past Medical History:  Diagnosis Date   Elevated lipids    Hypertension    Tobacco abuse    Significant Hospital Events: Including procedures, antibiotic start and stop dates in addition to other pertinent events   12/18 Flu A positive 12/20 Admitted, 5 minute code after non-compliance with BiPAP. MRSA PCR - neg, Urine strep > POS 12/21 Intubated, paralyzed, ARDS protocol, on 7cc/kg 12/22 Weaned to 6 cc/kg with dyssynchrony, back on 7 cc/kg, driving pressures good, weaning vent 12/23 Attempt to wean sedation again with dyssynchrony and desaturation.  Some concern for cuff leak, tube exchange.  Cuff did not appear to be blown.  Ongoing signs of cuff leak, query leaking around cough, possible large trachea, diuresing 12/25 tolerating PSV, myoclonus> ceribell, Neuro consult, changed to propofol, diuresed  12/26  Changed to DNR. On Propofol, versed.  Tolerating PSV.  MRI brain with hypoxic/anoxic injury. Blood culture neg 12/27 GPD's on EEG, pending transfer to [facility] for cEEG. NEURO CONSULT 12/28 moved to [facility] for LTM EEG 12/31 family updated by neuro: no chance for meaningful recovery, they should make plans for comfort measures 1/1 cultures - resp normal flora 1/2 eeg still with epileptogenicity. Palli fam mtg -- family does not want to hear from medical teams unless there is "good news"  1/3  cEEG with burst suppression w highly epileptiform bursts.  1/4 cEEG with burst suppression  + highly epileptiform bursts still.  WBC slightly up to 22.5. Temp high 99 low 100  c-EEG stopped due to lack of benefit Pallative care consult - wife upset about early dc from ER on 12/18 Chaplain consult: Wife is sufering from anticipatory grief + accepting god's way + curious/anger about events leading upto current state EEG: generalized epileptogenicity with high potential for seizures as well as profound diffuse encephalopathy. In the setting of cardiac arrest, this EEG pattern is suggestive of anoxic/hypoxic brain injury.  1/5 Low grade fever +. [place] 22.5K.  on Zosyn. On vent fio2 30%. TF +. Per RN - diprivan stopped yesterday. No myoclonus today. Mild tachycardia + Rpt pall care on 10/21/22 per wife 1/6 On vent FiO2 30%.  Tmax 99,58F ., WBC better. Still off diprivan -> No myoclonus. cXR visualized - devices in place and faiure clear. Oral oxy and klonpin stopped 1/8 No significant changes in neuro status. Tachycardic to 130s, hypertensive to 140s. Coreg transitioned to metoprolol. 1/11 wife considering trach/peg, to discuss with surgery-discussed with Dr. Bobbye Morton 1/12 more tachycardic 1/16-spoke with  wife at length at bedside, Dr. Hulen Skains also had a long conversation with spouse-desires to continue current aggressive care and desires PEG and trach placed 1/18 perc trach and peg 11/05/2022 transferred to floor due to need for  ICU bed 11/08/2022 transition to [facility] with pulmonary following weekly for trach 2/5 ATC 28%, no acute issues with trach   Interim History / Subjective:  Tmax 99.9  ATC 28%  Cuff down on trach   Objective:  Blood pressure 117/75, pulse (!) 115, temperature 99.9 F (37.7 C), temperature source Axillary, resp. rate (!) 29, height 5\' 4"  (1.626 m), weight 52.2 kg, SpO2 97 %.    FiO2 (%):  [28 %] 28 %   Intake/Output Summary (Last 24 hours) at 11/19/2022 1128 Last data filed at 11/19/2022 0534 Gross per 24 hour  Intake 7050.66 ml  Output 1750 ml  Net 5300.66 ml   Filed Weights   11/17/22 0336 11/18/22 0357 11/19/22 0637  Weight: 54.9 kg 54 kg 52.2 kg   Physical Exam: General: chronically ill appearing adult male lying in bed in NAD  HEENT: MM pink/moist, trach midline, cuff down, minimal secretions at site, #6 [facility]: eyes closed, does not respond to voice, no spontaneous movement noted  CV: s1s2 RRR, no m/r/g PULM: non-labored at rest, lungs bilaterally with good air entry, clear, 28% ATC GI: soft, bsx4 active  Extremities: warm/dry, no edema, space boots in place   Skin: no rashes or lesions  Resolved   Flu A AKI CAP, pneumococcal pneumonia - s/p Zosyn 12/31 - 1/7; Ceftriaxone 12/20- 12/27, Azithro 12/20, Flagyl 12/20 - 12/22  Assessment & Plan:   Anoxic brain injury, ventilator dependent s/p perc trach 1/18 Acute respiratory failure with hypoxia/ARDS secondary to flu and strep (completed rx) Seizures Paroxysmal sympathetic hyperactivity Fever Leukocytosis Anemia Hyperkalemia Tachycardia/hypotension due to paroxysmal sympathetic hyperactivity Severe protein calorie malnutrition hyperglycemia      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  Requires frequent suctioning. Not currently a candidate for decannulation due to frequent suctioning and mental status post arrest.    Plan -trach care per protocol  -change trach to #6 cuffless trach > would not consider decreasing to #4 unless mental status improved given risk of need of mechanical ventilation and would require upsize of trach -AMBU + Obturator at [facility]  -follow intermittent CXR  -pulmonary hygiene as able  -ATC for sats >90% and humidification of airway    PCCM will see again week of 2/12, please call sooner if new needs arise.       Noe Gens, MSN, APRN, NP-C, AGACNP-BC [facility] 11/19/2022, 11:37 AM   Please see [email or web address] for pager details.   From 7A-7P if no response, please call [phone] After hours, please call ELink [phone]

## 2022-11-20 DIAGNOSIS — J9601 Acute respiratory failure with hypoxia: Secondary | ICD-10-CM | POA: Diagnosis not present

## 2022-11-20 LAB — GLUCOSE, CAPILLARY
Glucose-Capillary: 102 mg/dL — ABNORMAL HIGH (ref 70–99)
Glucose-Capillary: 106 mg/dL — ABNORMAL HIGH (ref 70–99)
Glucose-Capillary: 113 mg/dL — ABNORMAL HIGH (ref 70–99)
Glucose-Capillary: 129 mg/dL — ABNORMAL HIGH (ref 70–99)
Glucose-Capillary: 135 mg/dL — ABNORMAL HIGH (ref 70–99)
Glucose-Capillary: 137 mg/dL — ABNORMAL HIGH (ref 70–99)
Glucose-Capillary: 147 mg/dL — ABNORMAL HIGH (ref 70–99)

## 2022-11-20 NOTE — Progress Notes (Signed)
[facility] PROGRESS NOTE  Damon [name] (DOB: 1961-09-01) ZOX:[id number] PCP: [facility]  Brief Narrative: Damon [name] is a 62 y.o. male with a history of HTN, HLD, tobacco use, chronic back pain who presented to the [facility] on 10/03/2022 with influenza A. He's had a protracted hospitalization summarized below.  Significant Events 12/18 Flu A positive 12/20 Admitted, bipap, 5 minute code MRSA PCR - neg Urine strep _POS 12/21 Intubated, paralyzed, ARDS protocol, on 7cc/kg 12/22 Weaned to 6 cc/kg with dyssynchrony, back on 7 cc/kg, driving pressures good, weaning vent 12/23 Attempt to wean sedation again with dyssynchrony and desaturation.  Some concern for cuff leak, tube exchange.  Cuff did not appear to be blown.  Ongoing signs of cuff leak, query leaking around cough, possible large trachea, diuresing 12/25 tolerating PSV, myoclonus> ceribell, Neuro consult, changed to propofol, diuresed  12/26 Changed to DNR. On Propofol, versed.  Tolerating PSV.  MRI brain with hypoxic/anoxic injury Blood culture neg 12/27 GPD's on EEG, pending transfer to [facility] for cEEG NEURO CONSULT 12/28 moved to [facility] for LTM EEG 12/31 family updated by neuro: no chance for meaningful recovery, they should make plans for comfort measures 1/1 cultures - resp normal flora 1/2 eeg still with epileptogenicity. Palli fam mtg -- family does not want to hear from medical teams unless there is "good news"  1/3  cEEG with burst suppression w highly epileptiform bursts.  1/4 cEEG with burst suppression  + highly epileptiform bursts still.  WBC slightly up to 22.5. Temp high 99 low 100  c-EEG stopped duie to lack of benefit Pallative care consult - wife upset about early dc from ER on 12/18 Chaplain consult: Wife is sufering from anticipatory grief + accepting god's way + curious/anger about events leading upto current state EEG: generalized epileptogenicity with high potential for seizures as well as  profound diffuse encephalopathy. In the setting of cardiac arrest, this EEG pattern is suggestive of anoxic/hypoxic brain injury.  1/5 - Low grade fever +. [place] 22.5K.  on Zosyn. On vent fio2 30%. TF +. Per RN - diprivan stopped yesterday. No myoclonus today. Mild tachycardia + Rpt pall care on 10/21/22 per wife 1/6 - On vent FiO2 30%.  Tmax 99,23F ., WBC better. Still off diprivan -> No myoclonus. cXR visualized - devices in place and faiure clear Oral oxy and klonpin stopped 1/8 No significant changes in neuro status. Tachycardic to 130s, hypertensive to 140s. Coreg transitioned to metoprolol. 1/11 wife considering trach/peg, to discuss with surgery-discussed with Dr. Bobbye Morton 1/12 more tachycardic 1/16-spoke with wife at length at bedside, Dr. Hulen Skains also had a long conversation with spouse-desires to continue current aggressive care and desires PEG and trach placed 1/18 perc trach and peg 11/05/2022 transferred to floor due to need for ICU bed 11/08/2022 transition to [facility] with pulmonary following weekly for trach 2/4 recurrent fever, cultures collected. 2/5 changed to cuffless trach  Subjective: Had soft BM this morning, exchanged to cuffless trach yesterday. No other events reported. No family at bedside this AM.  Objective: BP 113/76 (BP Location: Left Leg)   Pulse 96   Temp 98.2 F (36.8 C) (Axillary)   Resp (!) 21   Ht 5\' 4"  (1.626 m)   Wt 53.1 kg   SpO2 100%   BMI 20.08 kg/m   Not meaningfully responsive, fluttered eyelids when I greeted him, ?coincidental.  Clear, nonlabored RRR, rate in 90's, no edema +BS, soft, ND No extremity deformities, flaccid Sacral wound reimaged today  as pictured below. No induration.   Assessment & Plan: Goals of Care: IPAL from 1/24 reviewed. He had been DNR, but transitioned back to full code. - Remains full code per extensive discussions with patient's spouse.  - We will continue all aggressive medical work up/interventions  as indicated. - It will be important to be specific about clinical details when communicating with spouse. Would not generalize or offer any statement that could be construed as misleading.    Acute hypoxic respiratory failure, ARDS due to influenza A (+12/18) and superimposed Pneumococcal pneumonia: - s/p tamiflu 12/21-12/25 and ceftriaxone 12/20 - 12/26. Then had zosyn x7 days starting 12/31 per report. 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 Appreciate PCCM involvement for management. Site appears stable, oxygenation stable.    PEA arrest:  - Continue cardiac monitoring, electrolyte monitoring.   Anoxic brain injury Myoclonic Seizure activity  Neurology discussed with family on 12/31. 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  Signed off 12/31. - Continue keppra, depakene   Recurrent Fevers  Leukocytosis: Intermittent fevers throughout hospital stay. Again febrile 2/4. No change to exam of wound or lungs, no change to oxygenation. Last true fever 1/25 while on cefepime (1/22 - 1/28). Respiratory cultures 1/1, 1/9, 1/13, 1/21 with normal flora. Blood cultures 12/26, 1/13, 1/21, 1/29 no growth. Had coag negative staph at presentation, suspect that was contaminant. - Continues low grade elevations and persistent leukocytosis though this has consistently decreased off antimicrobial therapy. Had fever 2/4, cultures pending (remain NGTD), and CBC showed continued decrease in WBC to 12.7. - No indwelling foley or central lines. Decubitus is not infected. continue monitoring off abx for now.   Constipation: Abd remains soft and nondistended, XR 2/3 showed no ileus/obstruction.   - Continue scheduled senokot. Having regular BMs now.  Elevated LFT's: Mild, stable. No cholestatic elevations. - Monitor in intermittently.    Paroxysmal sympathetic hyperactivity: Causing  tachycardia/hypotension.  - Continue propranolol and neurontin     Severe protein calorie malnutrition:  - Continue tube feeds thru PEG placed 11/01/2022. At goal rate and tolerating.    Prediabetes: HbA1c 5.9% on 12/20.  - SSI q4h   Hypernatremia: Mild, suspect increased insensible losses with occasional fever.  - Increased free water to 231ml q4h. Recheck in AM.   Pressure Ulcers Pressure Injury 10/12/22 Sacrum Mid;Upper Unstageable - Full thickness tissue loss in which the base of the injury is covered by slough (yellow, tan, gray, green or brown) and/or eschar (tan, brown or black) in the wound bed. (Active)  10/12/22 0200  Location: Sacrum  Location Orientation: Mid;Upper  Staging: Unstageable - Full thickness tissue loss in which the base of the injury is covered by slough (yellow, tan, gray, green or brown) and/or eschar (tan, brown or black) in the wound bed.  Wound Description (Comments):   Present on Admission: Yes     Pressure Injury 10/23/22 Ear Right;Upper Stage 2 -  Partial thickness loss of dermis presenting as a shallow open injury with a red, pink wound bed without slough. (Active)  10/23/22 2009  Location: Ear  Location Orientation: Right;Upper  Staging: Stage 2 -  Partial thickness loss of dermis presenting as a shallow open injury with a red, pink wound bed without slough.  Wound Description (Comments):   Present on Admission: No  - Continue local wound care. Reordered turn q2h for offloading. Optimize nutritional status.   Patrecia Pour, MD [facility]  [email or web address] 11/20/2022, 10:34 AM

## 2022-11-21 DIAGNOSIS — J9601 Acute respiratory failure with hypoxia: Secondary | ICD-10-CM | POA: Diagnosis not present

## 2022-11-21 LAB — CBC
HCT: 36.7 % — ABNORMAL LOW (ref 39.0–52.0)
Hemoglobin: 11.4 g/dL — ABNORMAL LOW (ref 13.0–17.0)
MCH: 28.6 pg (ref 26.0–34.0)
MCHC: 31.1 g/dL (ref 30.0–36.0)
MCV: 92.2 fL (ref 80.0–100.0)
Platelets: 302 10*3/uL (ref 150–400)
RBC: 3.98 MIL/uL — ABNORMAL LOW (ref 4.22–5.81)
RDW: 16.3 % — ABNORMAL HIGH (ref 11.5–15.5)
WBC: 12.1 10*3/uL — ABNORMAL HIGH (ref 4.0–10.5)
nRBC: 0.2 % (ref 0.0–0.2)

## 2022-11-21 LAB — BASIC METABOLIC PANEL
Anion gap: 12 (ref 5–15)
Anion gap: 13 (ref 5–15)
BUN: 60 mg/dL — ABNORMAL HIGH (ref 6–20)
BUN: 65 mg/dL — ABNORMAL HIGH (ref 6–20)
CO2: 25 mmol/L (ref 22–32)
CO2: 25 mmol/L (ref 22–32)
Calcium: 9.2 mg/dL (ref 8.9–10.3)
Calcium: 9.4 mg/dL (ref 8.9–10.3)
Chloride: 110 mmol/L (ref 98–111)
Chloride: 111 mmol/L (ref 98–111)
Creatinine, Ser: 0.59 mg/dL — ABNORMAL LOW (ref 0.61–1.24)
Creatinine, Ser: 0.62 mg/dL (ref 0.61–1.24)
GFR, Estimated: >60 mL/min (ref >60)
GFR, Estimated: >60 mL/min (ref >60)
Glucose, Bld: 113 mg/dL — ABNORMAL HIGH (ref 70–99)
Glucose, Bld: 109 mg/dL — ABNORMAL HIGH (ref 70–99)
Potassium: 4.4 mmol/L (ref 3.5–5.1)
Potassium: 4.2 mmol/L (ref 3.5–5.1)
Sodium: 147 mmol/L — ABNORMAL HIGH (ref 135–145)
Sodium: 149 mmol/L — ABNORMAL HIGH (ref 135–145)

## 2022-11-21 LAB — GLUCOSE, CAPILLARY
Glucose-Capillary: 134 mg/dL — ABNORMAL HIGH (ref 70–99)
Glucose-Capillary: 113 mg/dL — ABNORMAL HIGH (ref 70–99)
Glucose-Capillary: 119 mg/dL — ABNORMAL HIGH (ref 70–99)
Glucose-Capillary: 116 mg/dL — ABNORMAL HIGH (ref 70–99)
Glucose-Capillary: 118 mg/dL — ABNORMAL HIGH (ref 70–99)

## 2022-11-21 MED ORDER — FREE WATER
300.0000 mL | Status: DC
  Administered 2022-11-21 – 2022-11-23 (×12): 300 mL

## 2022-11-21 MED ORDER — FREE WATER: 300.0000 mL | Status: DC

## 2022-11-21 MED ORDER — FREE WATER
250.0000 mL | Status: DC
  Administered 2022-11-21 (×3): 250 mL

## 2022-11-21 NOTE — Progress Notes (Signed)
[facility] PROGRESS NOTE  Damon [name] (DOB: 02-15-1961) YIR:[id number] PCP: [facility]  Brief Narrative: Damon [name] is Damon [name] 62 y.o. male with Damon [name] history of HTN, HLD, tobacco use, chronic back pain who presented to the [facility] on 10/03/2022 with influenza Damon [name]. He's had Damon [name] protracted hospitalization summarized below.  Significant Events 12/18 Flu Damon [name] positive 12/20 Admitted, bipap, 5 minute code MRSA PCR - neg Urine strep _POS 12/21 Intubated, paralyzed, ARDS protocol, on 7cc/kg 12/22 Weaned to 6 cc/kg with dyssynchrony, back on 7 cc/kg, driving pressures good, weaning vent 12/23 Attempt to wean sedation again with dyssynchrony and desaturation.  Some concern for cuff leak, tube exchange.  Cuff did not appear to be blown.  Ongoing signs of cuff leak, query leaking around cough, possible large trachea, diuresing 12/25 tolerating PSV, myoclonus> ceribell, Neuro consult, changed to propofol, diuresed  12/26 Changed to DNR. On Propofol, versed.  Tolerating PSV.  MRI brain with hypoxic/anoxic injury Blood culture neg 12/27 GPD's on EEG, pending transfer to [facility] for cEEG NEURO CONSULT 12/28 moved to [facility] for LTM EEG 12/31 family updated by neuro: no chance for meaningful recovery, they should make plans for comfort measures 1/1 cultures - resp normal flora 1/2 eeg still with epileptogenicity. Palli fam mtg -- family does not want to hear from medical teams unless there is "good news"  1/3  cEEG with burst suppression w highly epileptiform bursts.  1/4 cEEG with burst suppression  + highly epileptiform bursts still.  WBC slightly up to 22.5. Temp high 99 low 100  c-EEG stopped duie to lack of benefit Pallative care consult - wife upset about early dc from ER on 12/18 Chaplain consult: Wife is sufering from anticipatory grief + accepting god's way + curious/anger about events leading upto current state EEG: generalized epileptogenicity with high potential for seizures as well as  profound diffuse encephalopathy. In the setting of cardiac arrest, this EEG pattern is suggestive of anoxic/hypoxic brain injury.  1/5 - Low grade fever +. [place] 22.5K.  on Zosyn. On vent fio2 30%. TF +. Per RN - diprivan stopped yesterday. No myoclonus today. Mild tachycardia + Rpt pall care on 10/21/22 per wife 1/6 - On vent FiO2 30%.  Tmax 99,60F ., WBC better. Still off diprivan -> No myoclonus. cXR visualized - devices in place and faiure clear Oral oxy and klonpin stopped 1/8 No significant changes in neuro status. Tachycardic to 130s, hypertensive to 140s. Coreg transitioned to metoprolol. 1/11 wife considering trach/peg, to discuss with surgery-discussed with Dr. Bobbye Morton 1/12 more tachycardic 1/16-spoke with wife at length at bedside, Dr. Hulen Skains also had [name] long conversation with spouse-desires to continue current aggressive care and desires PEG and trach placed 1/18 perc trach and peg 11/05/2022 transferred to floor due to need for ICU bed 11/08/2022 transition to [facility] with pulmonary following weekly for trach 2/4 recurrent fever, cultures collected. 2/5 changed to cuffless trach  Subjective: Nonverbal   Objective: BP 96/74 (BP Location: Left Leg)   Pulse (!) 115   Temp 98.9 F (37.2 C) (Axillary)   Resp (!) 23   Ht 5\' 4"  (1.626 m)   Wt 52.6 kg   SpO2 96%   BMI 19.91 kg/m   General: comatose Cardiovascular: sinus tachy  Lungs: unlabored, trach  Neurological: no meaningful interactions Skin: decub not examined today Extremities: No clubbing or cyanosis. No edema.  Assessment & Plan: Goals of Care: IPAL from 1/24 reviewed. He had been DNR, but transitioned back to full code. -  Remains full code per extensive discussions with patient's spouse.  - We will continue all aggressive medical work up/interventions as indicated.   Acute hypoxic respiratory failure, ARDS due to influenza Damon [name] (+12/18) and superimposed Pneumococcal pneumonia: - s/p tamiflu 12/21-12/25  and ceftriaxone 12/20 - 12/26. Then had zosyn x7 days starting 12/31 per report. 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 Appreciate PCCM involvement for management. Site appears stable, oxygenation stable.    PEA arrest:  - Continue cardiac monitoring, electrolyte monitoring.   Anoxic brain injury Myoclonic Seizure activity  Neurology discussed with family on 12/31. 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  Signed off 12/31. - Continue keppra, depakene   Recurrent Fevers  Leukocytosis: Intermittent fevers throughout hospital stay. Last febrile 2/4. No change to exam of wound or lungs, no change to oxygenation. Last true fever prior to that 1/25 while on cefepime (1/22 - 1/28). Respiratory cultures 1/1, 1/9, 1/13, 1/21 with normal flora. Blood cultures 12/26, 1/13, 1/21, 1/29 no growth. Had coag negative staph at presentation, suspect that was contaminant. - Continues low grade elevations and persistent leukocytosis though this has consistently decreased off antimicrobial therapy. Had fever 2/4, cultures pending (remain NGTD), and CBC showed continued decrease in WBC to 12.7. - No indwelling foley or central lines. Decubitus is not infected. continue monitoring off abx for now.   Constipation: Abd remains soft and nondistended, XR 2/3 showed no ileus/obstruction.   - Continue scheduled senokot. Having regular BMs now.  Elevated LFT's: Mild, stable.  - Monitor in intermittently.    Paroxysmal sympathetic hyperactivity: Causing tachycardia/hypotension.  - Continue propranolol and neurontin     Severe protein calorie malnutrition:  - Continue tube feeds thru PEG placed 11/01/2022. At goal rate and tolerating.    Prediabetes: HbA1c 5.9% on 12/20.  - SSI q4h   Hypernatremia: Mild, suspect increased insensible losses with occasional fever.  - Increased free water to 250  ml q4h. Trend.   Pressure Ulcers Pressure Injury 10/12/22 Sacrum Mid;Upper Unstageable - Full thickness tissue loss in which the base of the injury is covered by slough (yellow, tan, gray, green or brown) and/or eschar (tan, brown or black) in the wound bed. (Active)  10/12/22 0200  Location: Sacrum  Location Orientation: Mid;Upper  Staging: Unstageable - Full thickness tissue loss in which the base of the injury is covered by slough (yellow, tan, gray, green or brown) and/or eschar (tan, brown or black) in the wound bed.  Wound Description (Comments):   Present on Admission: Yes     Pressure Injury 10/23/22 Ear Right;Upper Stage 2 -  Partial thickness loss of dermis presenting as Anayansi Rundquist shallow open injury with Anaka Beazer red, pink wound bed without slough. (Active)  10/23/22 2009  Location: Ear  Location Orientation: Right;Upper  Staging: Stage 2 -  Partial thickness loss of dermis presenting as Alanii Ramer shallow open injury with Branston Halsted red, pink wound bed without slough.  Wound Description (Comments):   Present on Admission: No   Fayrene Helper, MD [facility] [email or web address] 11/21/2022, 1:47 PM

## 2022-11-22 DIAGNOSIS — J9601 Acute respiratory failure with hypoxia: Secondary | ICD-10-CM | POA: Diagnosis not present

## 2022-11-22 LAB — BASIC METABOLIC PANEL
Anion gap: 11 (ref 5–15)
Anion gap: 11 (ref 5–15)
BUN: 63 mg/dL — ABNORMAL HIGH (ref 6–20)
BUN: 57 mg/dL — ABNORMAL HIGH (ref 6–20)
CO2: 27 mmol/L (ref 22–32)
CO2: 26 mmol/L (ref 22–32)
Calcium: 9.1 mg/dL (ref 8.9–10.3)
Calcium: 9.2 mg/dL (ref 8.9–10.3)
Chloride: 108 mmol/L (ref 98–111)
Chloride: 107 mmol/L (ref 98–111)
Creatinine, Ser: 0.55 mg/dL — ABNORMAL LOW (ref 0.61–1.24)
Creatinine, Ser: 0.47 mg/dL — ABNORMAL LOW (ref 0.61–1.24)
GFR, Estimated: >60 mL/min (ref >60)
GFR, Estimated: >60 mL/min (ref >60)
Glucose, Bld: 125 mg/dL — ABNORMAL HIGH (ref 70–99)
Glucose, Bld: 86 mg/dL (ref 70–99)
Potassium: 4.3 mmol/L (ref 3.5–5.1)
Potassium: 4.9 mmol/L (ref 3.5–5.1)
Sodium: 146 mmol/L — ABNORMAL HIGH (ref 135–145)
Sodium: 144 mmol/L (ref 135–145)

## 2022-11-22 LAB — CBC
HCT: 33.4 % — ABNORMAL LOW (ref 39.0–52.0)
Hemoglobin: 10.5 g/dL — ABNORMAL LOW (ref 13.0–17.0)
MCH: 28.7 pg (ref 26.0–34.0)
MCHC: 31.4 g/dL (ref 30.0–36.0)
MCV: 91.3 fL (ref 80.0–100.0)
Platelets: 285 10*3/uL (ref 150–400)
RBC: 3.66 MIL/uL — ABNORMAL LOW (ref 4.22–5.81)
RDW: 16.3 % — ABNORMAL HIGH (ref 11.5–15.5)
WBC: 12 10*3/uL — ABNORMAL HIGH (ref 4.0–10.5)
nRBC: 0.4 % — ABNORMAL HIGH (ref 0.0–0.2)

## 2022-11-22 LAB — GLUCOSE, CAPILLARY
Glucose-Capillary: 102 mg/dL — ABNORMAL HIGH (ref 70–99)
Glucose-Capillary: 118 mg/dL — ABNORMAL HIGH (ref 70–99)
Glucose-Capillary: 124 mg/dL — ABNORMAL HIGH (ref 70–99)
Glucose-Capillary: 79 mg/dL (ref 70–99)
Glucose-Capillary: 89 mg/dL (ref 70–99)
Glucose-Capillary: 99 mg/dL (ref 70–99)

## 2022-11-22 LAB — MAGNESIUM: Magnesium: 2.6 mg/dL — ABNORMAL HIGH (ref 1.7–2.4)

## 2022-11-22 LAB — PHOSPHORUS: Phosphorus: 4.6 mg/dL (ref 2.5–4.6)

## 2022-11-22 MED ORDER — ORAL CARE MOUTH RINSE: 15.0000 mL | OROMUCOSAL | Status: DC | PRN

## 2022-11-22 MED ORDER — ORAL CARE MOUTH RINSE
15.0000 mL | OROMUCOSAL | Status: DC
  Administered 2022-11-22 – 2022-11-23 (×5): 15 mL via OROMUCOSAL

## 2022-11-22 NOTE — Plan of Care (Signed)
  Problem: Clinical Measurements: Goal: Ability to maintain clinical measurements within normal limits will improve Outcome: Progressing Goal: Diagnostic test results will improve Outcome: Progressing Goal: Cardiovascular complication will be avoided Outcome: Progressing   Problem: Elimination: Goal: Will not experience complications related to bowel motility Outcome: Progressing Goal: Will not experience complications related to urinary retention Outcome: Progressing

## 2022-11-22 NOTE — Progress Notes (Signed)
Nutrition Follow-up  DOCUMENTATION CODES:   Not applicable  INTERVENTION:  Continue tube feeding via G-tube: -Osmolite 1.5 at 65 mL/hour (1560 mL goal daily volume) -PROSource TF20 60 mL BID per tube -Provides: 2500 kcal, 137 grams of protein, 1185 mL H2O daily  With current free water flush of 300 mL every 3 hours, pt receives a total of 3585 mL H2O daily including water in tube feeds.  Continue Juven 1 packet per tube BID. Each packet provides 95 kcal, 7 grams L-Arginine, 7 grams L-Glutamine, 2.5 grams collagen protein, 300 mg vitamin C, 9.5 mg zinc, and other micronutrients essential for wound healing.   NUTRITION DIAGNOSIS:   Inadequate oral intake related to inability to eat as evidenced by NPO status.  Ongoing - addressing with tube feeds.  GOAL:   Patient will meet greater than or equal to 90% of their needs  Met with TF regimen.  MONITOR:   Labs, Weight trends, TF tolerance, Skin, I & O's  REASON FOR ASSESSMENT:   Consult Enteral/tube feeding initiation and management  ASSESSMENT:   Pt admitted from home with the flu leading to acute respiratory failure with hypoxia and PEA arrest upon admission. PMH significant for HTN, HLD, chronic back pain, tobacco use.  12/18: Flu A positive 12/20: admitted, 5 minute code after non-compliance with BiPAP 12/21: intubated, paralyzed, ARDS protocol 12/26: MRI brain with hypoxic/anoxic injury  12/28: transfer from [facility] to [facility] 4N 1/18: s/p tracheostomy tube placement and G-tube placement 1/21: tube feeds changed from Osmolite 1.5 to Vital 1.5 due to shortage of Osmolite 1.5 1/22: transfer from 4N to [place]; rectal tube removed; free water flush reduced to 200 mL every 8 hours 1/24: water flushes changed to 150 mL every 4 hours   2/5: water flush increased to 200 mL every 4 hours per MD; changed to cuffless trach 2/7: water flush increased to 250 mL every 4 hours per MD 2/7: water flush increased to 300 mL every 3 hours per  MD  No family members present at time of RD assessment. Pt is on trach collar 6 L/min 28% FiO2. Pt is not verbally responsive. Pt receiving bath at time of RD assessment. RN reports pt continues to tolerate tube feed regimen well. Wounds are healing. Noted water flushes have been increased per MD in setting of hypernatremia. Noted in chart possible trach/SNF placement once pt medically ready and trach is 71 days old.  Admission wt was 64.9 kg. Weights trended down this admission. Pt was 52.1 kg on 10/24/22 and 51.1 kg on 11/02/22. Calories were increased on 10/30/22. Most recent weight is 55.8 kg on 11/22/22, but question if this is falsely elevated as wt on 2/7 was 52.6 kg. Will continue to monitor weight trends.  Enteral Access: 24 Fr. G-tube placed 1/18  Tube feed regimen: Osmolite 1.5 at 65 mL/hour + PROSource TF20 60 mL BID + free water 300 mL every 3 hours  Medications reviewed and include: famotidine, gabapentin, Keppra, Juven BID, propranolol, sennosides, valproic acid  Labs reviewed: CBG 99-124, Sodium 146, BUN 63, Creatinine 0.55, Magnesium 2.6  UOP: 650 mL (0.5 mL/kg/hr)  I/O: +5788.5 mL since 11/08/22  Diet Order:   Diet Order     None      EDUCATION NEEDS:   No education needs have been identified at this time  Skin:  Skin Assessment: Skin Integrity Issues: Skin Integrity Issues:: Stage II, Unstageable Stage II: right upper ear (1 cm x 0.25 cm) epithelialized Unstageable: sacrum (9 cm x  1.5 cm x 0 cm) early/partial granulation  Last BM:  11/21/22 (no stool characteristics available)  Height:   Ht Readings from Last 1 Encounters:  10/11/22 5\' 4"  (1.626 m)   Weight:   Wt Readings from Last 1 Encounters:  11/22/22 55.8 kg   BMI:  Body mass index is 21.11 kg/m.  Estimated Nutritional Needs:   Kcal:  2400-2600  Protein:  120-140 grams  Fluid:  >/= 2 L/day  Loanne Drilling, MS, RD, LDN, CNSC Pager number available on Amion

## 2022-11-22 NOTE — Progress Notes (Signed)
[facility] PROGRESS NOTE  Damon [name] (DOB: 1961/04/04) VQQ:[id number] PCP: [facility]  Brief Narrative: Damon [name] is Wright Gravely 62 y.o. male with Damon [name] history of HTN, HLD, tobacco use, chronic back pain who presented to the [facility] on 10/03/2022 with influenza Damon [name]. He's had [name] protracted hospitalization summarized below.  Significant Events 12/18 Flu Damon [name] positive 12/20 Admitted, bipap, 5 minute code MRSA PCR - neg Urine strep _POS 12/21 Intubated, paralyzed, ARDS protocol, on 7cc/kg 12/22 Weaned to 6 cc/kg with dyssynchrony, back on 7 cc/kg, driving pressures good, weaning vent 12/23 Attempt to wean sedation again with dyssynchrony and desaturation.  Some concern for cuff leak, tube exchange.  Cuff did not appear to be blown.  Ongoing signs of cuff leak, query leaking around cough, possible large trachea, diuresing 12/25 tolerating PSV, myoclonus> ceribell, Neuro consult, changed to propofol, diuresed  12/26 Changed to DNR. On Propofol, versed.  Tolerating PSV.  MRI brain with hypoxic/anoxic injury Blood culture neg 12/27 GPD's on EEG, pending transfer to [facility] for cEEG NEURO CONSULT 12/28 moved to [facility] for LTM EEG 12/31 family updated by neuro: no chance for meaningful recovery, they should make plans for comfort measures 1/1 cultures - resp normal flora 1/2 eeg still with epileptogenicity. Palli fam mtg -- family does not want to hear from medical teams unless there is "good news"  1/3  cEEG with burst suppression w highly epileptiform bursts.  1/4 cEEG with burst suppression  + highly epileptiform bursts still.  WBC slightly up to 22.5. Temp high 99 low 100  c-EEG stopped duie to lack of benefit Pallative care consult - wife upset about early dc from ER on 12/18 Chaplain consult: Wife is sufering from anticipatory grief + accepting god's way + curious/anger about events leading upto current state EEG: generalized epileptogenicity with high potential for seizures as well as  profound diffuse encephalopathy. In the setting of cardiac arrest, this EEG pattern is suggestive of anoxic/hypoxic brain injury.  1/5 - Low grade fever +. [place] 22.5K.  on Zosyn. On vent fio2 30%. TF +. Per RN - diprivan stopped yesterday. No myoclonus today. Mild tachycardia + Rpt pall care on 10/21/22 per wife 1/6 - On vent FiO2 30%.  Tmax 99,31F ., WBC better. Still off diprivan -> No myoclonus. cXR visualized - devices in place and faiure clear Oral oxy and klonpin stopped 1/8 No significant changes in neuro status. Tachycardic to 130s, hypertensive to 140s. Coreg transitioned to metoprolol. 1/11 wife considering trach/peg, to discuss with surgery-discussed with Dr. Bobbye Morton 1/12 more tachycardic 1/16-spoke with wife at length at bedside, Dr. Hulen Skains also had Damon [name] long conversation with spouse-desires to continue current aggressive care and desires PEG and trach placed 1/18 perc trach and peg 11/05/2022 transferred to floor due to need for ICU bed 11/08/2022 transition to [facility] with pulmonary following weekly for trach 2/4 recurrent fever, cultures collected. 2/5 changed to cuffless trach  Subjective: Nonverbal   Objective: BP 103/72 (BP Location: Left Arm)   Pulse 95   Temp 97.8 F (36.6 C) (Axillary)   Resp 14   Ht 5\' 4"  (1.626 m)   Wt 55.8 kg   SpO2 100%   BMI 21.11 kg/m   General: comatose. Cardiovascular: regular, mildly tachy Lungs: trach, unlabored Abdomen: Soft, nontender, nondistended - peg Neurological: comatose, no meaningful interaction or movement Skin: deferred eval of decubitus ulcer today Extremities: No clubbing or cyanosis. No edema.   Assessment & Plan: Goals of Care: IPAL from 1/24 reviewed. He  had been DNR, but transitioned back to full code. - Remains full code per extensive discussions with patient's spouse.  - We will continue all aggressive medical work up/interventions as indicated.   Acute hypoxic respiratory failure, ARDS due to  influenza Damon [name] (+12/18) and superimposed Pneumococcal pneumonia: - s/p tamiflu 12/21-12/25 and ceftriaxone 12/20 - 12/26. Then had zosyn x7 days starting 12/31 per report. 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 Appreciate PCCM involvement for management. Site appears stable, oxygenation stable.    PEA arrest:  - Continue cardiac monitoring, electrolyte monitoring.   Anoxic brain injury Myoclonic Seizure activity  Neurology discussed with family on 12/31. 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  Signed off 12/31. - Continue keppra, depakene   Recurrent Fevers  Leukocytosis: Intermittent fevers throughout hospital stay. Last febrile 2/4. No change to exam of wound or lungs, no change to oxygenation. Last true fever prior to that 1/25 while on cefepime (1/22 - 1/28). Respiratory cultures 1/1, 1/9, 1/13, 1/21 with normal flora. Blood cultures 12/26, 1/13, 1/21, 1/29 no growth. Had coag negative staph at presentation, suspect that was contaminant. - Continues low grade elevations and persistent leukocytosis though this has consistently decreased off antimicrobial therapy. Had fever 2/4, cultures pending (remain NGTD), and CBC showed continued decrease in WBC to 12.7. - No indwelling foley or central lines. Decubitus is not infected. continue monitoring off abx for now.   Constipation: Abd remains soft and nondistended, XR 2/3 showed no ileus/obstruction.   - Continue scheduled senokot. Having regular BMs now.  Elevated LFT's: Mild, stable.  - Monitor in intermittently.    Paroxysmal sympathetic hyperactivity: Causing tachycardia/hypotension.  - Continue propranolol and neurontin     Severe protein calorie malnutrition:  - Continue tube feeds thru PEG placed 11/01/2022. At goal rate and tolerating.    Prediabetes: HbA1c 5.9% on 12/20.  - SSI q4h   Hypernatremia:  -  Increased free water to 300 ml q3h. Trend.  Improving.   Pressure Ulcers Pressure Injury 10/12/22 Sacrum Mid;Upper Unstageable - Full thickness tissue loss in which the base of the injury is covered by slough (yellow, tan, gray, green or brown) and/or eschar (tan, brown or black) in the wound bed. (Active)  10/12/22 0200  Location: Sacrum  Location Orientation: Mid;Upper  Staging: Unstageable - Full thickness tissue loss in which the base of the injury is covered by slough (yellow, tan, gray, green or brown) and/or eschar (tan, brown or black) in the wound bed.  Wound Description (Comments):   Present on Admission: Yes     Pressure Injury 10/23/22 Ear Right;Upper Stage 2 -  Partial thickness loss of dermis presenting as Rashun Grattan shallow open injury with Aloni Chuang red, pink wound bed without slough. (Active)  10/23/22 2009  Location: Ear  Location Orientation: Right;Upper  Staging: Stage 2 -  Partial thickness loss of dermis presenting as Anntonette Madewell shallow open injury with Nilton Lave red, pink wound bed without slough.  Wound Description (Comments):   Present on Admission: No   Fayrene Helper, MD [facility] [email or web address] 11/22/2022, 3:54 PM

## 2022-11-22 NOTE — Progress Notes (Signed)
CSW following to speak with patients spouse regarding possible trach/SNF placement closer to patient being medically ready. Damon [name] needs to be 46 days old for trach/SNF placement.

## 2022-11-23 DIAGNOSIS — J9601 Acute respiratory failure with hypoxia: Secondary | ICD-10-CM | POA: Diagnosis not present

## 2022-11-23 LAB — CBC
HCT: 33.3 % — ABNORMAL LOW (ref 39.0–52.0)
Hemoglobin: 10.3 g/dL — ABNORMAL LOW (ref 13.0–17.0)
MCH: 28.5 pg (ref 26.0–34.0)
MCHC: 30.9 g/dL (ref 30.0–36.0)
MCV: 92 fL (ref 80.0–100.0)
Platelets: 271 10*3/uL (ref 150–400)
RBC: 3.62 MIL/uL — ABNORMAL LOW (ref 4.22–5.81)
RDW: 15.7 % — ABNORMAL HIGH (ref 11.5–15.5)
WBC: 13 10*3/uL — ABNORMAL HIGH (ref 4.0–10.5)
nRBC: 0.2 % (ref 0.0–0.2)

## 2022-11-23 LAB — GLUCOSE, CAPILLARY
Glucose-Capillary: 103 mg/dL — ABNORMAL HIGH (ref 70–99)
Glucose-Capillary: 106 mg/dL — ABNORMAL HIGH (ref 70–99)
Glucose-Capillary: 111 mg/dL — ABNORMAL HIGH (ref 70–99)
Glucose-Capillary: 117 mg/dL — ABNORMAL HIGH (ref 70–99)
Glucose-Capillary: 86 mg/dL (ref 70–99)
Glucose-Capillary: 91 mg/dL (ref 70–99)

## 2022-11-23 LAB — CULTURE, BLOOD (ROUTINE X 2)
Culture: NO GROWTH
Culture: NO GROWTH

## 2022-11-23 LAB — BASIC METABOLIC PANEL
Anion gap: 12 (ref 5–15)
BUN: 48 mg/dL — ABNORMAL HIGH (ref 6–20)
CO2: 23 mmol/L (ref 22–32)
Calcium: 8.8 mg/dL — ABNORMAL LOW (ref 8.9–10.3)
Chloride: 104 mmol/L (ref 98–111)
Creatinine, Ser: 0.5 mg/dL — ABNORMAL LOW (ref 0.61–1.24)
GFR, Estimated: >60 mL/min (ref >60)
Glucose, Bld: 110 mg/dL — ABNORMAL HIGH (ref 70–99)
Potassium: 5 mmol/L (ref 3.5–5.1)
Sodium: 139 mmol/L (ref 135–145)

## 2022-11-23 LAB — PHOSPHORUS: Phosphorus: 4.2 mg/dL (ref 2.5–4.6)

## 2022-11-23 LAB — MAGNESIUM: Magnesium: 2.2 mg/dL (ref 1.7–2.4)

## 2022-11-23 MED ORDER — ORAL CARE MOUTH RINSE: 15.0000 mL | OROMUCOSAL | Status: DC | PRN

## 2022-11-23 MED ORDER — FREE WATER
300.0000 mL | Status: DC
  Administered 2022-11-23 – 2022-11-24 (×6): 300 mL

## 2022-11-23 MED ORDER — ORAL CARE MOUTH RINSE
15.0000 mL | OROMUCOSAL | Status: DC
  Administered 2022-11-23 – 2023-01-22 (×240): 15 mL via OROMUCOSAL

## 2022-11-23 NOTE — Plan of Care (Signed)

## 2022-11-23 NOTE — Progress Notes (Signed)
[facility] PROGRESS NOTE  Brendan Lohan (DOB: 1961/05/23) CR:[id number] PCP: [facility]  Brief Narrative: Avid Sollie is Toree Edling 62 y.o. male with Damon [name] history of HTN, HLD, tobacco use, chronic back pain who presented to the [facility] on 10/03/2022 with influenza Torryn Fiske. He's had Jolyn Deshmukh protracted hospitalization summarized below.  Significant Events 12/18 Flu Ithan Touhey positive 12/20 Admitted, bipap, 5 minute code MRSA PCR - neg Urine strep _POS 12/21 Intubated, paralyzed, ARDS protocol, on 7cc/kg 12/22 Weaned to 6 cc/kg with dyssynchrony, back on 7 cc/kg, driving pressures good, weaning vent 12/23 Attempt to wean sedation again with dyssynchrony and desaturation.  Some concern for cuff leak, tube exchange.  Cuff did not appear to be blown.  Ongoing signs of cuff leak, query leaking around cough, possible large trachea, diuresing 12/25 tolerating PSV, myoclonus> ceribell, Neuro consult, changed to propofol, diuresed  12/26 Changed to DNR. On Propofol, versed.  Tolerating PSV.  MRI brain with hypoxic/anoxic injury Blood culture neg 12/27 GPD's on EEG, pending transfer to [facility] for cEEG NEURO CONSULT 12/28 moved to [facility] for LTM EEG 12/31 family updated by neuro: no chance for meaningful recovery, they should make plans for comfort measures 1/1 cultures - resp normal flora 1/2 eeg still with epileptogenicity. Palli fam mtg -- family does not want to hear from medical teams unless there is "good news"  1/3  cEEG with burst suppression w highly epileptiform bursts.  1/4 cEEG with burst suppression  + highly epileptiform bursts still.  WBC slightly up to 22.5. Temp high 99 low 100  c-EEG stopped duie to lack of benefit Pallative care consult - wife upset about early dc from ER on 12/18 Chaplain consult: Wife is sufering from anticipatory grief + accepting god's way + curious/anger about events leading upto current state EEG: generalized epileptogenicity with high potential for seizures as well as  profound diffuse encephalopathy. In the setting of cardiac arrest, this EEG pattern is suggestive of anoxic/hypoxic brain injury.  1/5 - Low grade fever +. Dora 22.5K.  on Zosyn. On vent fio2 30%. TF +. Per RN - diprivan stopped yesterday. No myoclonus today. Mild tachycardia + Rpt pall care on 10/21/22 per wife 1/6 - On vent FiO2 30%.  Tmax 99,16F ., WBC better. Still off diprivan -> No myoclonus. cXR visualized - devices in place and faiure clear Oral oxy and klonpin stopped 1/8 No significant changes in neuro status. Tachycardic to 130s, hypertensive to 140s. Coreg transitioned to metoprolol. 1/11 wife considering trach/peg, to discuss with surgery-discussed with Dr. Bobbye Morton 1/12 more tachycardic 1/16-spoke with wife at length at bedside, Dr. Hulen Skains also had Harlan Vinal long conversation with spouse-desires to continue current aggressive care and desires PEG and trach placed 1/18 perc trach and peg 11/05/2022 transferred to floor due to need for ICU bed 11/08/2022 [facility] to [facility] with pulmonary following weekly for trach 2/4 recurrent fever, cultures collected. 2/5 changed to cuffless trach  Subjective: Nonverbal Granddaughter and wife at bedside  Objective: BP 97/66 (BP Location: Left Arm)   Pulse 92   Temp 99 F (37.2 C) (Axillary)   Resp 20   Ht 5' 4"$  (1.626 m)   Wt 55.3 kg   SpO2 100%   BMI 20.94 kg/m    General: No acute distress. Cardiovascular: RRR Lungs: frequent cough today, bringing up secretions while I was in room  Abdomen: Soft, nontender, nondistended Neurological: comatose, no meaningful interaction Extremities: No clubbing or cyanosis. No edema.   Assessment & Plan: Goals of Care: IPAL  from 1/24 reviewed. He had been DNR, but transitioned back to full code. - Remains full code per extensive discussions with patient's spouse.  - We will continue all aggressive medical work up/interventions as indicated.   Acute hypoxic respiratory failure, ARDS due  to influenza Briunna Leicht (+12/18) and superimposed Pneumococcal pneumonia: - s/p tamiflu 12/21-12/25 and ceftriaxone 12/20 - 12/26. Then had zosyn x7 days starting 12/31 per report. 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 Appreciate PCCM involvement for management. Site appears stable, oxygenation stable.  Noted secretions today, suction as needed.  Will follow.  PEA arrest:  - Continue cardiac monitoring, electrolyte monitoring.   Anoxic brain injury Myoclonic Seizure activity  Neurology discussed with family on 12/31. 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  Signed off 12/31. - Continue keppra, depakene   Recurrent Fevers  Leukocytosis: Intermittent fevers throughout hospital stay. Last febrile 2/4. No change to exam of wound or lungs, no change to oxygenation. Last true fever prior to that 1/25 while on cefepime (1/22 - 1/28). Respiratory cultures 1/1, 1/9, 1/13, 1/21 with normal flora. Blood cultures 12/26, 1/13, 1/21, 1/29 no growth. Had coag negative staph at presentation, suspect that was contaminant. - Continues low grade elevations and persistent leukocytosis though this has consistently decreased off antimicrobial therapy. Had fever 2/4, cultures pending (remain NGTD), and CBC showed continued decrease in WBC to 12.7. - No indwelling foley or central lines. Decubitus is not infected. continue monitoring off abx for now.   Constipation: Abd remains soft and nondistended, XR 2/3 showed no ileus/obstruction.   - Continue scheduled senokot. Having regular BMs now.  Elevated LFT's: Mild, stable.  - Monitor in intermittently.    Paroxysmal sympathetic hyperactivity: Causing tachycardia/hypotension.  - Continue propranolol and neurontin     Severe protein calorie malnutrition:  - Continue tube feeds thru PEG placed 11/01/2022. At goal rate and tolerating.    Prediabetes:  HbA1c 5.9% on 12/20.  - SSI q4h   Hypernatremia:  - free water adjusted to 300 ml q4h. Trend.  Improved, will continue to adjust as needed.   Pressure Ulcers Pressure Injury 10/12/22 Sacrum Mid;Upper Stage 2 -  Partial thickness loss of dermis presenting as Kamaria Lucia shallow open injury with Geza Beranek red, pink wound bed without slough. (Active)  10/12/22 0200  Location: Sacrum  Location Orientation: Mid;Upper  Staging: Stage 2 -  Partial thickness loss of dermis presenting as Holston Oyama shallow open injury with Jesselee Poth red, pink wound bed without slough.  Wound Description (Comments):   Present on Admission: Yes     Pressure Injury 10/23/22 Ear Right;Upper Stage 2 -  Partial thickness loss of dermis presenting as Tiauna Whisnant shallow open injury with Reylene Stauder red, pink wound bed without slough. (Active)  10/23/22 2009  Location: Ear  Location Orientation: Right;Upper  Staging: Stage 2 -  Partial thickness loss of dermis presenting as Glenden Rossell shallow open injury with [place] Severs red, pink wound bed without slough.  Wound Description (Comments):   Present on Admission: No   Fayrene Helper, MD [facility] [email or web address] 11/23/2022, 1:50 PM

## 2022-11-24 DIAGNOSIS — J9601 Acute respiratory failure with hypoxia: Secondary | ICD-10-CM | POA: Diagnosis not present

## 2022-11-24 LAB — BASIC METABOLIC PANEL
Anion gap: 9 (ref 5–15)
BUN: 36 mg/dL — ABNORMAL HIGH (ref 6–20)
CO2: 24 mmol/L (ref 22–32)
Calcium: 8.9 mg/dL (ref 8.9–10.3)
Chloride: 101 mmol/L (ref 98–111)
Creatinine, Ser: 0.47 mg/dL — ABNORMAL LOW (ref 0.61–1.24)
GFR, Estimated: >60 mL/min (ref >60)
Glucose, Bld: 113 mg/dL — ABNORMAL HIGH (ref 70–99)
Potassium: 4 mmol/L (ref 3.5–5.1)
Sodium: 134 mmol/L — ABNORMAL LOW (ref 135–145)

## 2022-11-24 LAB — GLUCOSE, CAPILLARY
Glucose-Capillary: 103 mg/dL — ABNORMAL HIGH (ref 70–99)
Glucose-Capillary: 86 mg/dL (ref 70–99)
Glucose-Capillary: 90 mg/dL (ref 70–99)
Glucose-Capillary: 91 mg/dL (ref 70–99)
Glucose-Capillary: 92 mg/dL (ref 70–99)
Glucose-Capillary: 95 mg/dL (ref 70–99)
Glucose-Capillary: 99 mg/dL (ref 70–99)

## 2022-11-24 LAB — CBC
HCT: 34.3 % — ABNORMAL LOW (ref 39.0–52.0)
Hemoglobin: 11.1 g/dL — ABNORMAL LOW (ref 13.0–17.0)
MCH: 28.5 pg (ref 26.0–34.0)
MCHC: 32.4 g/dL (ref 30.0–36.0)
MCV: 88.2 fL (ref 80.0–100.0)
Platelets: 283 10*3/uL (ref 150–400)
RBC: 3.89 MIL/uL — ABNORMAL LOW (ref 4.22–5.81)
RDW: 15.6 % — ABNORMAL HIGH (ref 11.5–15.5)
WBC: 10.4 10*3/uL (ref 4.0–10.5)
nRBC: 0.2 % (ref 0.0–0.2)

## 2022-11-24 LAB — MAGNESIUM: Magnesium: 2 mg/dL (ref 1.7–2.4)

## 2022-11-24 LAB — PHOSPHORUS: Phosphorus: 3.4 mg/dL (ref 2.5–4.6)

## 2022-11-24 MED ORDER — FREE WATER
250.0000 mL | Status: DC
  Administered 2022-11-24 – 2022-11-28 (×24): 250 mL

## 2022-11-24 NOTE — Plan of Care (Signed)

## 2022-11-24 NOTE — Progress Notes (Signed)
[facility] PROGRESS NOTE  Damon [name] (DOB: 1961/08/15) XO:[id number] [facility]CP: [facility]  Brief Narrative: Damon [name] is Damon [name] 62 y.o. male with Damon [name] history of HTN, HLD, tobacco use, chronic back pain who presented to the [name] on 10/03/2022 with influenza Damon [name]. He's had Damon [name] protracted hospitalization summarized below.  Significant Events 12/18 Flu Damon [name] positive 12/20 Admitted, bipap, 5 minute code MRSA PCR - neg Urine strep _POS 12/21 Intubated, paralyzed, ARDS protocol, on 7cc/kg 12/22 Weaned to 6 cc/kg with dyssynchrony, back on 7 cc/kg, driving pressures good, weaning vent 12/23 Attempt to wean sedation again with dyssynchrony and desaturation.  Some concern for cuff leak, tube exchange.  Cuff did not appear to be blown.  Ongoing signs of cuff leak, query leaking around cough, possible large trachea, diuresing 12/25 tolerating PSV, myoclonus> ceribell, Neuro consult, changed to propofol, diuresed  12/26 Changed to DNR. On Propofol, versed.  Tolerating PSV.  MRI brain with hypoxic/anoxic injury Blood culture neg 12/27 GPD's on EEG, pending transfer to [name] for cEEG NEURO CONSULT 12/28 moved to [name] for LTM EEG 12/31 family updated by neuro: no chance for meaningful recovery, they should make plans for comfort measures 1/1 cultures - resp normal flora 1/2 eeg still with epileptogenicity. Palli fam mtg -- family does not want to hear from medical teams unless there is "good news"  1/3  cEEG with burst suppression w highly epileptiform bursts.  1/4 cEEG with burst suppression  + highly epileptiform bursts still.  WBC slightly up to 22.5. Temp high 99 low 100  c-EEG stopped duie to lack of benefit Pallative care consult - wife upset about early dc from ER on 12/18 Chaplain consult: Wife is sufering from anticipatory grief + accepting god's way + curious/anger about events leading upto current state EEG: generalized epileptogenicity with high potential for seizures as well as  profound diffuse encephalopathy. In the setting of cardiac arrest, this EEG pattern is suggestive of anoxic/hypoxic brain injury.  1/5 - Low grade fever +. Damon [name] 22.5K.  on Zosyn. On vent fio2 30%. TF +. Per RN - diprivan stopped yesterday. No myoclonus today. Mild tachycardia + Rpt pall care on 10/21/22 per wife 1/6 - On vent FiO2 30%.  Tmax 99,21F ., WBC better. Still off diprivan -> No myoclonus. cXR visualized - devices in place and faiure clear Oral oxy and klonpin stopped 1/8 No significant changes in neuro status. Tachycardic to 130s, hypertensive to 140s. Coreg transitioned to metoprolol. 1/11 wife considering trach/peg, to discuss with surgery-discussed with Dr. Bobbye [name] 1/12 more tachycardic 1/16-spoke with wife at length at bedside, Dr. Hulen [name] also had Damon [name] long conversation with spouse-desires to continue current aggressive care and desires PEG and trach placed 1/18 perc trach and peg 11/05/2022 transferred to floor due to need for ICU bed 11/08/2022 transition to [facility] with pulmonary following weekly for trach 2/4 recurrent fever, cultures collected. 2/5 changed to cuffless trach  Subjective: Nonverbal Sisters at bedside  Objective: BP 110/78 (BP Location: Left Arm)   Pulse (!) 102   Temp 99.3 [name] (37.4 C) (Axillary)   Resp (!) 26   Ht 5' 4"$  (1.626 m)   Wt 55.3 kg   SpO2 100%   BMI 20.94 kg/m    General: comatose Cardiovascular: Heart sounds show Damon [name] regular rate, and rhythm.  Lungs: Clear to auscultation bilaterally - trach  Abdomen: Soft, nontender, nondistended  Neurological: comatose, no meaningful interaction Extremities: No clubbing or cyanosis. No edema  Assessment & Plan: Goals of Care:  IPAL from 1/24 reviewed. He had been DNR, but transitioned back to full code. - Remains full code per extensive discussions with patient's spouse.  - We will continue all aggressive medical work up/interventions as indicated.   Acute hypoxic respiratory failure,  ARDS due to influenza Damon [name] (+12/18) and superimposed Pneumococcal pneumonia: - s/p tamiflu 12/21-12/25 and ceftriaxone 12/20 - 12/26. Then had zosyn x7 days starting 12/31 per report. 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 Appreciate PCCM involvement for management. Site appears stable, oxygenation stable.   PEA arrest:  - Continue cardiac monitoring, electrolyte monitoring.   Anoxic brain injury Myoclonic Seizure activity  Neurology discussed with family on 12/31. 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  Signed off 12/31. - Continue keppra, depakene   Recurrent Fevers  Leukocytosis: Intermittent fevers throughout [name] stay. Last febrile 2/4. No change to exam of wound or lungs, no change to oxygenation. Last true fever prior to that 1/25 while on cefepime (1/22 - 1/28). Respiratory cultures 1/1, 1/9, 1/13, 1/21 with normal flora. Blood cultures 12/26, 1/13, 1/21, 1/29 no growth. Had coag negative staph at presentation, suspect that was contaminant. - Continues low grade elevations and persistent leukocytosis though this has consistently decreased off antimicrobial therapy. Had fever 2/4, cultures pending (remain NGTD), and CBC showed continued decrease in WBC to 12.7. - No indwelling foley or central lines. Decubitus is not infected. continue monitoring off abx for now.   Constipation: Abd remains soft and nondistended, XR 2/3 showed no ileus/obstruction.   - Continue scheduled senokot. Having regular BMs now.  Elevated LFT's: Mild, stable.  - Monitor in intermittently.    Paroxysmal sympathetic hyperactivity: Causing tachycardia/hypotension.  - Continue propranolol and neurontin     Severe protein calorie malnutrition:  - Continue tube feeds thru PEG placed 11/01/2022. At goal rate and tolerating.    Prediabetes: HbA1c 5.9% on 12/20.  - SSI q4h    Hypernatremia:  - adjusting free water daily as needed    Pressure Ulcers Pressure Injury 10/12/22 Sacrum Mid;Upper Stage 2 -  Partial thickness loss of dermis presenting as Daylani Deblois shallow open injury with Alfhild Partch red, pink wound bed without slough. (Active)  10/12/22 0200  Location: Sacrum  Location Orientation: Mid;Upper  Staging: Stage 2 -  Partial thickness loss of dermis presenting as Ahliyah Nienow shallow open injury with Chalese Peach red, pink wound bed without slough.  Wound Description (Comments):   Present on Admission: Yes     Pressure Injury 10/23/22 Ear Right;Upper Stage 2 -  Partial thickness loss of dermis presenting as Adair Lauderback shallow open injury with Maribeth Jiles red, pink wound bed without slough. (Active)  10/23/22 2009  Location: Ear  Location Orientation: Right;Upper  Staging: Stage 2 -  Partial thickness loss of dermis presenting as Loreal Schuessler shallow open injury with Khadeejah Castner red, pink wound bed without slough.  Wound Description (Comments):   Present on Admission: No   Fayrene Helper, MD [facility] [email or web address] 11/24/2022, 3:14 PM

## 2022-11-25 DIAGNOSIS — J9601 Acute respiratory failure with hypoxia: Secondary | ICD-10-CM | POA: Diagnosis not present

## 2022-11-25 LAB — GLUCOSE, CAPILLARY
Glucose-Capillary: 104 mg/dL — ABNORMAL HIGH (ref 70–99)
Glucose-Capillary: 107 mg/dL — ABNORMAL HIGH (ref 70–99)
Glucose-Capillary: 117 mg/dL — ABNORMAL HIGH (ref 70–99)
Glucose-Capillary: 137 mg/dL — ABNORMAL HIGH (ref 70–99)
Glucose-Capillary: 153 mg/dL — ABNORMAL HIGH (ref 70–99)
Glucose-Capillary: 158 mg/dL — ABNORMAL HIGH (ref 70–99)

## 2022-11-25 LAB — COMPREHENSIVE METABOLIC PANEL
ALT: 81 U/L — ABNORMAL HIGH (ref 0–44)
AST: 43 U/L — ABNORMAL HIGH (ref 15–41)
Albumin: 2.2 g/dL — ABNORMAL LOW (ref 3.5–5.0)
Alkaline Phosphatase: 95 U/L (ref 38–126)
Anion gap: 11 (ref 5–15)
BUN: 31 mg/dL — ABNORMAL HIGH (ref 6–20)
CO2: 24 mmol/L (ref 22–32)
Calcium: 9.1 mg/dL (ref 8.9–10.3)
Chloride: 101 mmol/L (ref 98–111)
Creatinine, Ser: 0.42 mg/dL — ABNORMAL LOW (ref 0.61–1.24)
GFR, Estimated: >60 mL/min (ref >60)
Glucose, Bld: 116 mg/dL — ABNORMAL HIGH (ref 70–99)
Potassium: 4.1 mmol/L (ref 3.5–5.1)
Sodium: 136 mmol/L (ref 135–145)
Total Bilirubin: 0.3 mg/dL (ref 0.3–1.2)
Total Protein: 7.2 g/dL (ref 6.5–8.1)

## 2022-11-25 LAB — CBC WITH DIFFERENTIAL/PLATELET
Abs Immature Granulocytes: 2 10*3/uL — ABNORMAL HIGH (ref 0.00–0.07)
Basophils Absolute: 0 10*3/uL (ref 0.0–0.1)
Basophils Relative: 0 %
Eosinophils Absolute: 0.4 10*3/uL (ref 0.0–0.5)
Eosinophils Relative: 3 %
HCT: 35 % — ABNORMAL LOW (ref 39.0–52.0)
Hemoglobin: 11.2 g/dL — ABNORMAL LOW (ref 13.0–17.0)
Lymphocytes Relative: 22 %
Lymphs Abs: 2.7 10*3/uL (ref 0.7–4.0)
MCH: 28 pg (ref 26.0–34.0)
MCHC: 32 g/dL (ref 30.0–36.0)
MCV: 87.5 fL (ref 80.0–100.0)
Metamyelocytes Relative: 4 %
Monocytes Absolute: 1.2 10*3/uL — ABNORMAL HIGH (ref 0.1–1.0)
Monocytes Relative: 10 %
Myelocytes: 11 %
Neutro Abs: 6 10*3/uL (ref 1.7–7.7)
Neutrophils Relative %: 49 %
Platelets: 300 10*3/uL (ref 150–400)
Promyelocytes Relative: 1 %
RBC: 4 MIL/uL — ABNORMAL LOW (ref 4.22–5.81)
RDW: 15.9 % — ABNORMAL HIGH (ref 11.5–15.5)
WBC: 12.3 10*3/uL — ABNORMAL HIGH (ref 4.0–10.5)
nRBC: 0 % (ref 0.0–0.2)
nRBC: 0 /100 WBC

## 2022-11-25 LAB — PHOSPHORUS: Phosphorus: 3.2 mg/dL (ref 2.5–4.6)

## 2022-11-25 LAB — MAGNESIUM: Magnesium: 2 mg/dL (ref 1.7–2.4)

## 2022-11-25 NOTE — Progress Notes (Signed)
[facility] PROGRESS NOTE  Damon [name] (DOB: 15-Jul-1961) XO:[id number] [facility]CP: [facility]  Brief Narrative: Venkat Brokaw is Linnea Todisco 62 y.o. male with Kayse Puccini history of HTN, HLD, tobacco use, chronic back pain who presented to the [facility] on 10/03/2022 with influenza Itsel Opfer. He's had Phinley Schall protracted hospitalization summarized below.  Significant Events 12/18 Flu Reality Dejonge positive 12/20 Admitted, bipap, 5 minute code MRSA PCR - neg Urine strep _POS 12/21 Intubated, paralyzed, ARDS protocol, on 7cc/kg 12/22 Weaned to 6 cc/kg with dyssynchrony, back on 7 cc/kg, driving pressures good, weaning vent 12/23 Attempt to wean sedation again with dyssynchrony and desaturation.  Some concern for cuff leak, tube exchange.  Cuff did not appear to be blown.  Ongoing signs of cuff leak, query leaking around cough, possible large trachea, diuresing 12/25 tolerating PSV, myoclonus> ceribell, Neuro consult, changed to propofol, diuresed  12/26 Changed to DNR. On Propofol, versed.  Tolerating PSV.  MRI brain with hypoxic/anoxic injury Blood culture neg 12/27 GPD's on EEG, pending transfer to [facility] for cEEG NEURO CONSULT 12/28 moved to [facility] for LTM EEG 12/31 family updated by neuro: no chance for meaningful recovery, they should make plans for comfort measures 1/1 cultures - resp normal flora 1/2 eeg still with epileptogenicity. Palli fam mtg -- family does not want to hear from medical teams unless there is "good news"  1/3  cEEG with burst suppression w highly epileptiform bursts.  1/4 cEEG with burst suppression  + highly epileptiform bursts still.  WBC slightly up to 22.5. Temp high 99 low 100  c-EEG stopped duie to lack of benefit Pallative care consult - wife upset about early dc from ER on 12/18 Chaplain consult: Wife is sufering from anticipatory grief + accepting god's way + curious/anger about events leading upto current state EEG: generalized epileptogenicity with high potential for seizures as well as  profound diffuse encephalopathy. In the setting of cardiac arrest, this EEG pattern is suggestive of anoxic/hypoxic brain injury.  1/5 - Low grade fever +. [place] 22.5K.  on Zosyn. On vent fio2 30%. TF +. Per RN - diprivan stopped yesterday. No myoclonus today. Mild tachycardia + Rpt pall care on 10/21/22 per wife 1/6 - On vent FiO2 30%.  Tmax 99,21F ., WBC better. Still off diprivan -> No myoclonus. cXR visualized - devices in place and faiure clear Oral oxy and klonpin stopped 1/8 No significant changes in neuro status. Tachycardic to 130s, hypertensive to 140s. Coreg transitioned to metoprolol. 1/11 wife considering trach/peg, to discuss with surgery-discussed with Dr. Bobbye Morton 1/12 more tachycardic 1/16-spoke with wife at length at bedside, Dr. Hulen Skains also had Arnulfo Batson long conversation with spouse-desires to continue current aggressive care and desires PEG and trach placed 1/18 perc trach and peg 11/05/2022 transferred to floor due to need for ICU bed 11/08/2022 transition to [facility] with pulmonary following weekly for trach 2/4 recurrent fever, cultures collected. 2/5 changed to cuffless trach  Subjective: nonverbal  Objective: BP 102/69 (BP Location: Right Arm)   Pulse (!) 105   Temp 98.6 F (37 C) (Axillary)   Resp 20   Ht 5' 4"$  (1.626 m)   Wt 57.2 kg   SpO2 100%   BMI 21.63 kg/m    General: comatose Cardiovascular: RRR Lungs: unlabored Abdomen: Soft, nontender, nondistended Neurological: comatose, no meaningful interaction  Extremities: no LEE  Assessment & Plan: Goals of Care: IPAL from 1/24 reviewed. He had been DNR, but transitioned back to full code. - Remains full code per extensive discussions with  patient's spouse.  - We will continue all aggressive medical work up/interventions as indicated.   Acute hypoxic respiratory failure, ARDS due to influenza Lafern Brinkley (+12/18) and superimposed Pneumococcal pneumonia: - s/p tamiflu 12/21-12/25 and ceftriaxone 12/20 - 12/26.  Then had zosyn x7 days starting 12/31 per report. 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 Appreciate PCCM involvement for management. Site appears stable, oxygenation stable.   PEA arrest:  - Continue cardiac monitoring, electrolyte monitoring.   Anoxic brain injury Myoclonic Seizure activity  Neurology discussed with family on 12/31. 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  Signed off 12/31. - Continue keppra, depakene   Recurrent Fevers  Leukocytosis: Intermittent fevers throughout hospital stay. Last febrile 2/4. No change to exam of wound or lungs, no change to oxygenation. Last true fever prior to that 1/25 while on cefepime (1/22 - 1/28). Respiratory cultures 1/1, 1/9, 1/13, 1/21 with normal flora. Blood cultures 12/26, 1/13, 1/21, 1/29 no growth. Had coag negative staph at presentation, suspect that was contaminant. - Continues low grade elevations and persistent leukocytosis though this has consistently decreased off antimicrobial therapy. Had fever 2/4, cultures pending (remain NGTD), and CBC showed continued decrease in WBC to 12.7. - No indwelling foley or central lines. Decubitus is not infected. continue monitoring off abx for now.   Constipation: Abd remains soft and nondistended, XR 2/3 showed no ileus/obstruction.   - Continue scheduled senokot. Having regular BMs now.  Elevated LFT's: Mild, stable.  - Monitor in intermittently.    Paroxysmal sympathetic hyperactivity: Causing tachycardia/hypotension.  - Continue propranolol and neurontin     Severe protein calorie malnutrition:  - Continue tube feeds thru PEG placed 11/01/2022. At goal rate and tolerating.    Prediabetes: HbA1c 5.9% on 12/20.  - SSI q4h   Hypernatremia:  - adjusting free water daily as needed    Pressure Ulcers Pressure Injury 10/12/22 Sacrum Mid;Upper Stage 2 -  Partial  thickness loss of dermis presenting as Felcia Huebert shallow open injury with Keagen Heinlen red, pink wound bed without slough. (Active)  10/12/22 0200  Location: Sacrum  Location Orientation: Mid;Upper  Staging: Stage 2 -  Partial thickness loss of dermis presenting as Wendall Isabell shallow open injury with Elizah Lydon red, pink wound bed without slough.  Wound Description (Comments):   Present on Admission: Yes     Pressure Injury 10/23/22 Ear Right;Upper Stage 2 -  Partial thickness loss of dermis presenting as Myracle Febres shallow open injury with Kateline Kinkade red, pink wound bed without slough. (Active)  10/23/22 2009  Location: Ear  Location Orientation: Right;Upper  Staging: Stage 2 -  Partial thickness loss of dermis presenting as Debbe Crumble shallow open injury with Talena Neira red, pink wound bed without slough.  Wound Description (Comments):   Present on Admission: No   Fayrene Helper, MD [facility] [email or web address] 11/25/2022, 4:06 PM

## 2022-11-25 NOTE — Progress Notes (Signed)
Pt BP 88/69, MAP 76, ST 105, sats 100% on humidified trach collar, axillary temp 99.  Notified MD of current VS and requesting parameters for propranolol due to sbp.

## 2022-11-25 NOTE — Progress Notes (Signed)
Pts. Wife is at bedside and asking to speak with Dr. Florene Glen. She reports she is concerned about how to safely remove the patient's facial hair with the trach, and also wants his nails clipped. Dr. Florene Glen says he will call the patient's wife.

## 2022-11-26 DIAGNOSIS — J9601 Acute respiratory failure with hypoxia: Secondary | ICD-10-CM | POA: Diagnosis not present

## 2022-11-26 LAB — BASIC METABOLIC PANEL
Anion gap: 9 (ref 5–15)
BUN: 34 mg/dL — ABNORMAL HIGH (ref 6–20)
CO2: 26 mmol/L (ref 22–32)
Calcium: 9 mg/dL (ref 8.9–10.3)
Chloride: 99 mmol/L (ref 98–111)
Creatinine, Ser: 0.45 mg/dL — ABNORMAL LOW (ref 0.61–1.24)
GFR, Estimated: >60 mL/min (ref >60)
Glucose, Bld: 120 mg/dL — ABNORMAL HIGH (ref 70–99)
Potassium: 4.4 mmol/L (ref 3.5–5.1)
Sodium: 134 mmol/L — ABNORMAL LOW (ref 135–145)

## 2022-11-26 LAB — GLUCOSE, CAPILLARY
Glucose-Capillary: 129 mg/dL — ABNORMAL HIGH (ref 70–99)
Glucose-Capillary: 105 mg/dL — ABNORMAL HIGH (ref 70–99)
Glucose-Capillary: 108 mg/dL — ABNORMAL HIGH (ref 70–99)
Glucose-Capillary: 111 mg/dL — ABNORMAL HIGH (ref 70–99)
Glucose-Capillary: 109 mg/dL — ABNORMAL HIGH (ref 70–99)

## 2022-11-26 LAB — CBC
HCT: 34.9 % — ABNORMAL LOW (ref 39.0–52.0)
Hemoglobin: 11 g/dL — ABNORMAL LOW (ref 13.0–17.0)
MCH: 28.1 pg (ref 26.0–34.0)
MCHC: 31.5 g/dL (ref 30.0–36.0)
MCV: 89 fL (ref 80.0–100.0)
Platelets: 269 10*3/uL (ref 150–400)
RBC: 3.92 MIL/uL — ABNORMAL LOW (ref 4.22–5.81)
RDW: 16.1 % — ABNORMAL HIGH (ref 11.5–15.5)
WBC: 14.8 10*3/uL — ABNORMAL HIGH (ref 4.0–10.5)
nRBC: 0.1 % (ref 0.0–0.2)

## 2022-11-26 LAB — PHOSPHORUS: Phosphorus: 3.8 mg/dL (ref 2.5–4.6)

## 2022-11-26 LAB — MAGNESIUM: Magnesium: 2 mg/dL (ref 1.7–2.4)

## 2022-11-26 MED ORDER — PROPRANOLOL HCL 40 MG PO TABS
40.0000 mg | ORAL_TABLET | Freq: Three times a day (TID) | ORAL | Status: DC
  Administered 2022-11-26 – 2022-11-28 (×6): 40 mg
  Filled 2022-11-26 (×8): qty 1

## 2022-11-26 NOTE — Progress Notes (Signed)
NAME:  Damon [name], MRN:  [id number], DOB:  [birth year], LOS: 63 ADMISSION DATE:  10/03/2022, CONSULTATION DATE:  10/03/2022 REFERRING MD:  Marylyn Ishihara - [facility] CHIEF COMPLAINT:  Dyspnea   History of Present Illness:  62 year old man admitted to [facility] 12/20 in the setting of acute hypoxemic respiratory failure due to Flu A. PMHx significant for HTN, HLD, chronic back pain, tobacco abuse   Placed on BIPAP.  Coded (likely in the setting of hypoxia), initial rhythm PEA with CPR x 5 minutes, required intubation.  After several days on mechanical ventilation remains comatose, MRI Brain 12/26 worrisome for anoxic brain injury.  Transferred to [facility] for LTM EEG monitoring.  Neurology consulted. LTM with generalized epileptogenicity. Treated with Propofol, Depakote, Keppra, Ketamine. 12/31 Neurology with goals of care with family. Relayed that patient has a grim neurological recovery due to irreversible anoxic injury. Family wished to continue aggressive measures.   Palliative care consulted. Stay complicated with neuro-storming.   Surgical consult regarding PEG/trach  Pertinent Medical History:   Past Medical History:  Diagnosis Date   Elevated lipids    Hypertension    Tobacco abuse    Significant Hospital Events: Including procedures, antibiotic start and stop dates in addition to other pertinent events   12/18 Flu A positive 12/20 Admitted, 5 minute code after non-compliance with BiPAP. MRSA PCR - neg, Urine strep > POS 12/21 Intubated, paralyzed, ARDS protocol, on 7cc/kg 12/22 Weaned to 6 cc/kg with dyssynchrony, back on 7 cc/kg, driving pressures good, weaning vent 12/23 Attempt to wean sedation again with dyssynchrony and desaturation.  Some concern for cuff leak, tube exchange.  Cuff did not appear to be blown.  Ongoing signs of cuff leak, query leaking around cough, possible large trachea, diuresing 12/25 tolerating PSV, myoclonus> ceribell, Neuro consult, changed to propofol, diuresed  12/26  Changed to DNR. On Propofol, versed.  Tolerating PSV.  MRI brain with hypoxic/anoxic injury. Blood culture neg 12/27 GPD's on EEG, pending transfer to [facility] for cEEG. NEURO CONSULT 12/28 moved to [facility] for LTM EEG 12/31 family updated by neuro: no chance for meaningful recovery, they should make plans for comfort measures 1/1 cultures - resp normal flora 1/2 eeg still with epileptogenicity. Palli fam mtg -- family does not want to hear from medical teams unless there is "good news"  1/3  cEEG with burst suppression w highly epileptiform bursts.  1/4 cEEG with burst suppression  + highly epileptiform bursts still.  WBC slightly up to 22.5. Temp high 99 low 100  c-EEG stopped due to lack of benefit Pallative care consult - wife upset about early dc from ER on 12/18 Chaplain consult: Wife is sufering from anticipatory grief + accepting god's way + curious/anger about events leading upto current state EEG: generalized epileptogenicity with high potential for seizures as well as profound diffuse encephalopathy. In the setting of cardiac arrest, this EEG pattern is suggestive of anoxic/hypoxic brain injury.  1/5 Low grade fever +. [place] 22.5K.  on Zosyn. On vent fio2 30%. TF +. Per RN - diprivan stopped yesterday. No myoclonus today. Mild tachycardia + Rpt pall care on 10/21/22 per wife 1/6 On vent FiO2 30%.  Tmax 99,34F ., WBC better. Still off diprivan -> No myoclonus. cXR visualized - devices in place and faiure clear. Oral oxy and klonpin stopped 1/8 No significant changes in neuro status. Tachycardic to 130s, hypertensive to 140s. Coreg transitioned to metoprolol. 1/11 wife considering trach/peg, to discuss with surgery-discussed with Dr. Bobbye Morton 1/12 more tachycardic 1/16-spoke with  wife at length at bedside, Dr. Hulen Skains also had a long conversation with spouse-desires to continue current aggressive care and desires PEG and trach placed 1/18 perc trach and peg 11/05/2022 transferred to floor due to need for  ICU bed 11/08/2022 transition to [facility] with pulmonary following weekly for trach 2/5 ATC 28%, no acute issues with trach, trach changed to cuffless 2/12 No issues this week   Interim History / Subjective:  Seen lying in bed minimally responsive   Objective:  Blood pressure 91/66, pulse 96, temperature 98.1 F (36.7 C), temperature source Axillary, resp. rate 14, height 5' 4"$  (1.626 m), weight 57.6 kg, SpO2 100 %.    FiO2 (%):  [28 %] 28 %   Intake/Output Summary (Last 24 hours) at 11/26/2022 1155 Last data filed at 11/26/2022 [id number] Gross per 24 hour  Intake 2070 ml  Output 1400 ml  Net 670 ml    Filed Weights   11/23/22 0338 11/25/22 0346 11/26/22 0500  Weight: 55.3 kg 57.2 kg 57.6 kg   Physical Exam: General: Acute on chronically ill appearing middle aged male lying in bed, in NAD HEENT: [place]/AT, MM pink/moist, PERRL,  Neuro: Will open eyes to verbal stimuli, unable to follow commands  CV: s1s2 regular rate and rhythm, no murmur, rubs, or gallops,  PULM:  Clear to auscultation, no increased work of breathing, no added breath sounds, on ATC GI: soft, bowel sounds active in all 4 quadrants, non-tender, non-distended, tolerating TF Extremities: warm/dry, no edema  Skin: no rashes or lesions  General: chronically ill appearing adult male lying in bed in NAD  HEENT: MM pink/moist, trach midline, cuff down, minimal secretions at site, #6 [place] Neuro: eyes closed, does not respond to voice, no spontaneous movement noted  CV: s1s2 RRR, no m/r/g PULM: non-labored at rest, lungs bilaterally with good air entry, clear, 28% ATC GI: soft, bsx4 active  Extremities: warm/dry, no edema, space boots in place   Skin: no rashes or lesions  Resolved   Flu A AKI CAP, pneumococcal pneumonia - s/p Zosyn 12/31 - 1/7; Ceftriaxone 12/20- 12/27, Azithro 12/20, Flagyl 12/20 - 12/22  Assessment & Plan:   Anoxic brain injury, ventilator dependent s/p perc trach 1/18 Acute  respiratory failure with hypoxia/ARDS secondary to flu and strep (completed rx) Seizures Paroxysmal sympathetic hyperactivity Fever Leukocytosis Anemia Hyperkalemia Tachycardia/hypotension due to paroxysmal sympathetic hyperactivity Severe protein calorie malnutrition hyperglycemia   Pulmonary problem list  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  Requires frequent suctioning. Not currently a candidate for decannulation due to frequent suctioning and mental status post arrest.  Trach changed to cuffless 2/5, would not consider decreasing to #4 unless mental status improved given risk of need of mechanical ventilation and would require upsize of trach  Plan Routine trach care  Pulmonary hygiene  Follow intermittent CXR Continue ATC with SPO2 goal > 90   PCCM will continue to see weekly, please call sooner if new needs arise.      Ozro Russett D. Harris, NP-C [facility] Personal contact information can be found on Amion  If no contact or response made please call [phone] 11/26/2022, 11:58 AM

## 2022-11-26 NOTE — Progress Notes (Signed)
[facility] PROGRESS NOTE  Damon [name] (DOB: [birth year]) [id number] PCP: [facility]  Brief Narrative: Damon [name] is Damon [name] 62 y.o. male with Damon [name] history of HTN, HLD, tobacco use, chronic back pain who presented to the [facility] on 10/03/2022 with influenza Damon [name]. He's had Damon [name] protracted hospitalization summarized below.  Significant Events 12/18 Flu Damon [name] positive 12/20 Admitted, bipap, 5 minute code MRSA PCR - neg Urine strep _POS 12/21 Intubated, paralyzed, ARDS protocol, on 7cc/kg 12/22 Weaned to 6 cc/kg with dyssynchrony, back on 7 cc/kg, driving pressures good, weaning vent 12/23 Attempt to wean sedation again with dyssynchrony and desaturation.  Some concern for cuff leak, tube exchange.  Cuff did not appear to be blown.  Ongoing signs of cuff leak, query leaking around cough, possible large trachea, diuresing 12/25 tolerating PSV, myoclonus> ceribell, Neuro consult, changed to propofol, diuresed  12/26 Changed to DNR. On Propofol, versed.  Tolerating PSV.  MRI brain with hypoxic/anoxic injury Blood culture neg 12/27 GPD's on EEG, pending transfer to [facility] for cEEG NEURO CONSULT 12/28 moved to [facility] for LTM EEG 12/31 family updated by neuro: no chance for meaningful recovery, they should make plans for comfort measures 1/1 cultures - resp normal flora 1/2 eeg still with epileptogenicity. Palli fam mtg -- family does not want to hear from medical teams unless there is "good news"  1/3  cEEG with burst suppression w highly epileptiform bursts.  1/4 cEEG with burst suppression  + highly epileptiform bursts still.  WBC slightly up to 22.5. Temp high 99 low 100  c-EEG stopped duie to lack of benefit Pallative care consult - wife upset about early dc from ER on 12/18 Chaplain consult: Wife is sufering from anticipatory grief + accepting god's way + curious/anger about events leading upto current state EEG: generalized epileptogenicity with high potential for seizures as well as  profound diffuse encephalopathy. In the setting of cardiac arrest, this EEG pattern is suggestive of anoxic/hypoxic brain injury.  1/5 - Low grade fever +. [place] 22.5K.  on Zosyn. On vent fio2 30%. TF +. Per RN - diprivan stopped yesterday. No myoclonus today. Mild tachycardia + Rpt pall care on 10/21/22 per wife 1/6 - On vent FiO2 30%.  Tmax 99,86F ., WBC better. Still off diprivan -> No myoclonus. cXR visualized - devices in place and faiure clear Oral oxy and klonpin stopped 1/8 No significant changes in neuro status. Tachycardic to 130s, hypertensive to 140s. Coreg transitioned to metoprolol. 1/11 wife considering trach/peg, to discuss with surgery-discussed with Dr. Bobbye Morton 1/12 more tachycardic 1/16-spoke with wife at length at bedside, Dr. Hulen [name] also had Damon [name] long conversation with spouse-desires to continue current aggressive care and desires PEG and trach placed 1/18 perc trach and peg 11/05/2022 transferred to floor due to need for ICU bed 11/08/2022 transition to [facility] with pulmonary following weekly for trach 2/4 recurrent fever, cultures collected. 2/5 changed to cuffless trach  Subjective: nonverbal  Objective: BP 95/63   Pulse 98   Temp 98.2 F (36.8 C) (Axillary)   Resp 16   Ht 5' 4"$  (1.626 m)   Wt 57.6 kg   SpO2 100%   BMI 21.80 kg/m    General: comatose Cardiovascular: RRR Lungs: unlabored, trach  Abdomen: Soft, nontender, nondistended  Neurological: comatose, no meaningful movements or interaction Skin: decub not evaluated Extremities: No clubbing or cyanosis. No edema  Assessment & Plan: Goals of Care: IPAL from 1/24 reviewed. He had been DNR, but transitioned back to full code. -  Remains full code per extensive discussions with patient's spouse.  - We will continue all aggressive medical work up/interventions as indicated.   Acute hypoxic respiratory failure, ARDS due to influenza Damon [name] (+12/18) and superimposed Pneumococcal pneumonia: - s/p  tamiflu 12/21-12/25 and ceftriaxone 12/20 - 12/26. Then had zosyn x7 days starting 12/31 per report. 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 Appreciate PCCM involvement for management. Site appears stable, oxygenation stable.   PEA arrest:  - Continue cardiac monitoring, electrolyte monitoring.   Anoxic brain injury Myoclonic Seizure activity  Neurology discussed with family on 12/31. 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  Signed off 12/31. - Continue keppra, depakene   Recurrent Fevers  Leukocytosis: Intermittent fevers throughout hospital stay. Last febrile 2/4. No change to exam of wound or lungs, no change to oxygenation. Last true fever prior to that 1/25 while on cefepime (1/22 - 1/28). Respiratory cultures 1/1, 1/9, 1/13, 1/21 with normal flora. Blood cultures 12/26, 1/13, 1/21, 1/29 no growth. Had coag negative staph at presentation, suspect that was contaminant. - Continues low grade elevations and persistent leukocytosis though this has consistently decreased off antimicrobial therapy. Had fever 2/4, cultures pending (remain NGTD), and CBC showed continued decrease in WBC to 12.7. - No indwelling foley or central lines. Decubitus is not infected. continue monitoring off abx for now.   Hypotension - soft BP at times, reduce propanolol dose, holding parameters in place  Constipation: Abd remains soft and nondistended, XR 2/3 showed no ileus/obstruction.   - Continue scheduled senokot. Having regular BMs now.  Elevated LFT's: Mild, stable.  - Monitor in intermittently.    Paroxysmal sympathetic hyperactivity: Causing tachycardia/hypotension.  - Continue propranolol and neurontin     Severe protein calorie malnutrition:  - Continue tube feeds thru PEG placed 11/01/2022. At goal rate and tolerating.    Prediabetes: HbA1c 5.9% on 12/20.  - SSI q4h    Hypernatremia:  - adjusting free water daily as needed    Pressure Ulcers Pressure Injury 10/12/22 Sacrum Mid;Upper Stage 2 -  Partial thickness loss of dermis presenting as Julienne Vogler shallow open injury with Rykker Coviello red, pink wound bed without slough. (Active)  10/12/22 0200  Location: Sacrum  Location Orientation: Mid;Upper  Staging: Stage 2 -  Partial thickness loss of dermis presenting as Sheranda Seabrooks shallow open injury with Ramyah Pankowski red, pink wound bed without slough.  Wound Description (Comments):   Present on Admission: Yes     Pressure Injury 10/23/22 Ear Right;Upper Stage 2 -  Partial thickness loss of dermis presenting as Armanda Forand shallow open injury with Rainy Rothman red, pink wound bed without slough. (Active)  10/23/22 2009  Location: Ear  Location Orientation: Right;Upper  Staging: Stage 2 -  Partial thickness loss of dermis presenting as Belina Mandile shallow open injury with Sehaj Kolden red, pink wound bed without slough.  Wound Description (Comments):   Present on Admission: No   Fayrene Helper, MD [facility] [email or web address] 11/26/2022, 5:34 PM

## 2022-11-26 NOTE — Plan of Care (Signed)

## 2022-11-27 DIAGNOSIS — J9601 Acute respiratory failure with hypoxia: Secondary | ICD-10-CM | POA: Diagnosis not present

## 2022-11-27 LAB — CBC WITH DIFFERENTIAL/PLATELET
Abs Immature Granulocytes: 0 10*3/uL (ref 0.00–0.07)
Basophils Absolute: 0 10*3/uL (ref 0.0–0.1)
Basophils Relative: 0 %
Eosinophils Absolute: 0.3 10*3/uL (ref 0.0–0.5)
Eosinophils Relative: 2 %
HCT: 35.2 % — ABNORMAL LOW (ref 39.0–52.0)
Hemoglobin: 11.4 g/dL — ABNORMAL LOW (ref 13.0–17.0)
Lymphocytes Relative: 13 %
Lymphs Abs: 1.9 10*3/uL (ref 0.7–4.0)
MCH: 28.4 pg (ref 26.0–34.0)
MCHC: 32.4 g/dL (ref 30.0–36.0)
MCV: 87.6 fL (ref 80.0–100.0)
Monocytes Absolute: 1.8 10*3/uL — ABNORMAL HIGH (ref 0.1–1.0)
Monocytes Relative: 12 %
Neutro Abs: 10.7 10*3/uL — ABNORMAL HIGH (ref 1.7–7.7)
Neutrophils Relative %: 73 %
Platelets: 282 10*3/uL (ref 150–400)
RBC: 4.02 MIL/uL — ABNORMAL LOW (ref 4.22–5.81)
RDW: 16 % — ABNORMAL HIGH (ref 11.5–15.5)
WBC: 14.7 10*3/uL — ABNORMAL HIGH (ref 4.0–10.5)
nRBC: 0.1 % (ref 0.0–0.2)
nRBC: 1 /100 WBC — ABNORMAL HIGH

## 2022-11-27 LAB — BASIC METABOLIC PANEL
Anion gap: 10 (ref 5–15)
BUN: 32 mg/dL — ABNORMAL HIGH (ref 6–20)
CO2: 26 mmol/L (ref 22–32)
Calcium: 9.1 mg/dL (ref 8.9–10.3)
Chloride: 98 mmol/L (ref 98–111)
Creatinine, Ser: 0.46 mg/dL — ABNORMAL LOW (ref 0.61–1.24)
GFR, Estimated: >60 mL/min (ref >60)
Glucose, Bld: 129 mg/dL — ABNORMAL HIGH (ref 70–99)
Potassium: 4.3 mmol/L (ref 3.5–5.1)
Sodium: 134 mmol/L — ABNORMAL LOW (ref 135–145)

## 2022-11-27 LAB — PHOSPHORUS: Phosphorus: 4 mg/dL (ref 2.5–4.6)

## 2022-11-27 LAB — GLUCOSE, CAPILLARY
Glucose-Capillary: 113 mg/dL — ABNORMAL HIGH (ref 70–99)
Glucose-Capillary: 121 mg/dL — ABNORMAL HIGH (ref 70–99)
Glucose-Capillary: 122 mg/dL — ABNORMAL HIGH (ref 70–99)
Glucose-Capillary: 126 mg/dL — ABNORMAL HIGH (ref 70–99)
Glucose-Capillary: 129 mg/dL — ABNORMAL HIGH (ref 70–99)
Glucose-Capillary: 132 mg/dL — ABNORMAL HIGH (ref 70–99)
Glucose-Capillary: 134 mg/dL — ABNORMAL HIGH (ref 70–99)

## 2022-11-27 LAB — MAGNESIUM: Magnesium: 2 mg/dL (ref 1.7–2.4)

## 2022-11-27 NOTE — Progress Notes (Signed)
[facility] PROGRESS NOTE  Damon [name] (DOB: 14-May-1961) [id number] [facility]CP: [facility]  Brief Narrative: Damon [name] is Damon [name] 62 y.o. male with Damon [name] history of HTN, HLD, tobacco use, chronic back pain who presented to the [facility] on 10/03/2022 with influenza Damon [name]. He's had Damon [name] protracted hospitalization summarized below.  Significant Events 12/18 Flu Damon [name] positive 12/20 Admitted, bipap, 5 minute code MRSA PCR - neg Urine strep _POS 12/21 Intubated, paralyzed, ARDS protocol, on 7cc/kg 12/22 Weaned to 6 cc/kg with dyssynchrony, back on 7 cc/kg, driving pressures good, weaning vent 12/23 Attempt to wean sedation again with dyssynchrony and desaturation.  Some concern for cuff leak, tube exchange.  Cuff did not appear to be blown.  Ongoing signs of cuff leak, query leaking around cough, possible large trachea, diuresing 12/25 tolerating PSV, myoclonus> ceribell, Neuro consult, changed to propofol, diuresed  12/26 Changed to DNR. On Propofol, versed.  Tolerating PSV.  MRI brain with hypoxic/anoxic injury Blood culture neg 12/27 GPD's on EEG, pending transfer to [facility] for cEEG NEURO CONSULT 12/28 moved to [facility] for LTM EEG 12/31 family updated by neuro: no chance for meaningful recovery, they should make plans for comfort measures 1/1 cultures - resp normal flora 1/2 eeg still with epileptogenicity. Palli fam mtg -- family does not want to hear from medical teams unless there is "good news"  1/3  cEEG with burst suppression w highly epileptiform bursts.  1/4 cEEG with burst suppression  + highly epileptiform bursts still.  WBC slightly up to 22.5. Temp high 99 low 100  c-EEG stopped duie to lack of benefit Pallative care consult - wife upset about early dc from ER on 12/18 Chaplain consult: Wife is sufering from anticipatory grief + accepting god's way + curious/anger about events leading upto current state EEG: generalized epileptogenicity with high potential for seizures as well as  profound diffuse encephalopathy. In the setting of cardiac arrest, this EEG pattern is suggestive of anoxic/hypoxic brain injury.  1/5 - Low grade fever +. [place] 22.5K.  on Zosyn. On vent fio2 30%. TF +. Per RN - diprivan stopped yesterday. No myoclonus today. Mild tachycardia + Rpt pall care on 10/21/22 per wife 1/6 - On vent FiO2 30%.  Tmax 99,22F ., WBC better. Still off diprivan -> No myoclonus. cXR visualized - devices in place and faiure clear Oral oxy and klonpin stopped 1/8 No significant changes in neuro status. Tachycardic to 130s, hypertensive to 140s. Coreg transitioned to metoprolol. 1/11 wife considering trach/peg, to discuss with surgery-discussed with Dr. Bobbye Morton 1/12 more tachycardic 1/16-spoke with wife at length at bedside, Dr. Hulen Skains also had Zayquan Bogard long conversation with spouse-desires to continue current aggressive care and desires PEG and trach placed 1/18 perc trach and peg 11/05/2022 transferred to floor due to need for ICU bed 11/08/2022 transition to [facility] with pulmonary following weekly for trach 2/4 recurrent fever, cultures collected. 2/5 changed to cuffless trach  Subjective: nonverbal  Objective: BP (!) 122/99 (BP Location: Left Arm)   Pulse (!) 129   Temp 98.7 F (37.1 C) (Axillary)   Resp (!) 21   Ht 5' 4"$  (1.626 m)   Wt 54.9 kg   SpO2 97%   BMI 20.77 kg/m    General: No acute distress. Cardiovascular: RRR, tachy Lungs: unlabored Abdomen: Soft, nontender, nondistended Neurological: comatose, no meaningful interaction Extremities: No clubbing or cyanosis. No edema.  Assessment & Plan: Goals of Care: IPAL from 1/24 reviewed. He had been DNR, but transitioned back to full code. -  Remains full code per extensive discussions with patient's spouse.  - Social work began to discuss plans for SNF with Mrs. Duman today, she was very upset.  Per SW, she said she was not putting her husband in [facility] and wanted him transferred to [facility].  I  returned to the floor to talk to Mrs. Damon [name], I had other competing demands at the time and asked if she would wait.  Unfortunately, it did take Damon [name] bit of time until I was able to speak with her (30 min to 1 hr), but by that time, she was leaving the waiting room and heading to her husband's room.  She told me she did not want to talk about anything in his room.  Will need to continue this conversation day to day. - We will continue all aggressive medical work up/interventions as indicated. - of note, Dr. Hulen Skains has previously been involved in this difficult case   Acute hypoxic respiratory failure, ARDS due to influenza Damon [name] (+12/18) and superimposed Pneumococcal pneumonia: - s/p tamiflu 12/21-12/25 and ceftriaxone 12/20 - 12/26. Then had zosyn x7 days starting 12/31 per report. 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 Appreciate PCCM involvement for management. Site appears stable, oxygenation stable.   PEA arrest:  - Continue cardiac monitoring, electrolyte monitoring.   Anoxic brain injury Myoclonic Seizure activity  Neurology discussed with family on 12/31. 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  Signed off 12/31. - Continue keppra, depakene   Recurrent Fevers  Leukocytosis: Intermittent fevers throughout hospital stay. Last febrile 2/4. No change to exam of wound or lungs, no change to oxygenation. Last true fever prior to that 1/25 while on cefepime (1/22 - 1/28). Respiratory cultures 1/1, 1/9, 1/13, 1/21 with normal flora. Blood cultures 12/26, 1/13, 1/21, 1/29 no growth. Had coag negative staph at presentation, suspect that was contaminant. - Continues low grade elevations and persistent leukocytosis though this has consistently decreased off antimicrobial therapy. Had fever 2/4, cultures pending (remain NGTD), and CBC showed continued decrease in WBC to 12.7. - No  indwelling foley or central lines. Decubitus is not infected. continue monitoring off abx for now.   Hypotension - seems improved, propanolol dose reduced.  Tachycardia seems worse, follow.   Constipation: Abd remains soft and nondistended, XR 2/3 showed no ileus/obstruction.   - Continue scheduled senokot. Having regular BMs now.  Elevated LFT's: Mild, stable.  - Monitor in intermittently.    Paroxysmal sympathetic hyperactivity: Causing tachycardia/hypotension.  Tachycardia seems worse today, I reduced dose of propanolol due to soft BP the other day, this may need to be transitioned back to the prior dose. - Continue propranolol and neurontin     Severe protein calorie malnutrition:  - Continue tube feeds thru PEG placed 11/01/2022. At goal rate and tolerating.    Prediabetes: HbA1c 5.9% on 12/20.  - SSI q4h   Hypernatremia:  - adjusting free water daily as needed    Pressure Ulcers Pressure Injury 10/12/22 Sacrum Mid;Upper Stage 2 -  Partial thickness loss of dermis presenting as Andreu Drudge shallow open injury with Amalee Olsen red, pink wound bed without slough. (Active)  10/12/22 0200  Location: Sacrum  Location Orientation: Mid;Upper  Staging: Stage 2 -  Partial thickness loss of dermis presenting as Daymon Hora shallow open injury with Josalin Carneiro red, pink wound bed without slough.  Wound Description (Comments):   Present on Admission: Yes     Pressure Injury 10/23/22 Ear  Right;Upper Stage 2 -  Partial thickness loss of dermis presenting as Lillybeth Tal shallow open injury with Aaminah Forrester red, pink wound bed without slough. (Active)  10/23/22 2009  Location: Ear  Location Orientation: Right;Upper  Staging: Stage 2 -  Partial thickness loss of dermis presenting as Theodore Rahrig shallow open injury with Dominiq Fontaine red, pink wound bed without slough.  Wound Description (Comments):   Present on Admission: No   Fayrene Helper, MD [facility] [email or web address] 11/27/2022, 6:59 PM

## 2022-11-27 NOTE — TOC Initial Note (Signed)
Transition of Care [facility]) - Initial/Assessment Note    Patient Details  Name: Damon [name] MRN: [id number] Date of Birth: 1961-01-14  [facility]) CM/SW Contact:    Milas Gain, [place] Phone Number: 11/27/2022, 4:28 PM  Clinical Narrative:                  CSW spoke to patients spouse by phone concerning disposition plans, spouse states she is not agreeable to discussing patient going to a SNF.  CSW attempted to discuss medicaid and financial counseling referral, patients spouse stated she would like to speak with CSW in person regarding disposition, but at the end of the conversation with CSW confirmed she does not want her spouse going to a SNF and that she wants patient transferred to [place] informed patients spouse that she will let MD know of her request. All questions answered. No further questions reported at this time. CSW informed MD.  Expected Discharge Plan:  (TBD)     Patient Goals and CMS Choice            Expected Discharge Plan and Services In-house Referral: Clinical Social Work Discharge Planning Services: CM Consult Post Acute Care Choice: NA Living arrangements for the past 2 months: [place]                   DME Agency: NA                  Prior Living Arrangements/Services Living arrangements for the past 2 months: [place] Lives with:: Spouse                   Activities of Daily Living Home Assistive Devices/Equipment: None ADL Screening (condition at time of admission) Patient's cognitive ability adequate to safely complete daily activities?: Yes Is the patient deaf or have difficulty hearing?: Yes Does the patient have difficulty seeing, even when wearing glasses/contacts?: No Does the patient have difficulty concentrating, remembering, or making decisions?: No Patient able to express need for assistance with ADLs?: Yes Does the patient have difficulty dressing or bathing?: No Independently  performs ADLs?: Yes (appropriate for developmental age) Does the patient have difficulty walking or climbing stairs?: No Weakness of Legs: None Weakness of Arms/Hands: None  Permission Sought/Granted                  Emotional Assessment         Alcohol / Substance Use: Not Applicable Psych Involvement: No (comment)  Admission diagnosis:  Influenza A [J10.1] Acute hypoxic respiratory failure ([place]) [J96.01] Patient Active Problem List   Diagnosis Date Noted   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   Advanced care planning/counseling discussion 10/21/2022   On mechanically assisted ventilation ([place]) 10/17/2022   Anoxic brain damage ([place]) 10/17/2022   Anoxic encephalopathy ([place]) 10/16/2022   Seizure ([place]) 10/16/2022   Pressure injury of skin 10/16/2022   Acute hypoxic respiratory failure ([place]) 10/03/2022   CAP (community acquired pneumonia) 10/03/2022   Influenza and pneumonia 10/03/2022   Elevated LFTs 10/03/2022   HTN (hypertension) 10/03/2022   HLD (hyperlipidemia) 10/03/2022   Tobacco abuse 10/03/2022   SIRS (systemic inflammatory response syndrome) ([place]) 10/03/2022   Influenza A 10/03/2022   Cardiac arrest ([place]) 10/03/2022   Tobacco dependence 10/03/2022   PCP:  [place]:   [facility] [place], [address]  [place] [address] Phone: [phone] Fax: [phone]     Social Determinants of Health (SDOH) Social History: [place]: No Food Insecurity (10/04/2022)  Housing: Low Risk  (10/04/2022)  Transportation Needs: No Transportation Needs (10/04/2022)  Utilities: Not At Risk (10/04/2022)  Tobacco Use: High Risk (11/02/2022)   SDOH Interventions:     Readmission Risk Interventions     No data to display

## 2022-11-28 DIAGNOSIS — J9601 Acute respiratory failure with hypoxia: Secondary | ICD-10-CM | POA: Diagnosis not present

## 2022-11-28 LAB — GLUCOSE, CAPILLARY
Glucose-Capillary: 124 mg/dL — ABNORMAL HIGH (ref 70–99)
Glucose-Capillary: 124 mg/dL — ABNORMAL HIGH (ref 70–99)
Glucose-Capillary: 107 mg/dL — ABNORMAL HIGH (ref 70–99)
Glucose-Capillary: 87 mg/dL (ref 70–99)
Glucose-Capillary: 90 mg/dL (ref 70–99)

## 2022-11-28 MED ORDER — PROPRANOLOL HCL 20 MG PO TABS
20.0000 mg | ORAL_TABLET | Freq: Three times a day (TID) | ORAL | Status: DC
  Administered 2022-11-28 – 2022-12-01 (×9): 20 mg
  Filled 2022-11-28 (×11): qty 1

## 2022-11-28 MED ORDER — FREE WATER
200.0000 mL | Status: DC
  Administered 2022-11-28 – 2022-12-14 (×96): 200 mL

## 2022-11-28 NOTE — NC FL2 (Signed)
[place] LEVEL OF CARE FORM     IDENTIFICATION  Patient Name: Damon [name] Birthdate: 05-29-1961 Sex: male Admission Date (Current Location): 10/03/2022  [facility] and [place] Number:  Herbalist and Address:  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) 10/17/2022   Anoxic brain damage ([place]) 10/17/2022   Anoxic encephalopathy ([place]) 10/16/2022   Seizure ([place]) 10/16/2022   Pressure injury of skin 10/16/2022   Acute hypoxic respiratory failure ([place]) 10/03/2022   CAP (community acquired pneumonia) 10/03/2022   Influenza and pneumonia 10/03/2022   Elevated LFTs 10/03/2022   HTN (hypertension) 10/03/2022   HLD (hyperlipidemia) 10/03/2022   Tobacco abuse 10/03/2022   SIRS (systemic inflammatory response syndrome) ([place]) 10/03/2022   Influenza A 10/03/2022   Cardiac arrest ([place]) 10/03/2022   Tobacco dependence 10/03/2022    Orientation RESPIRATION BLADDER Height & Weight      (intubated/trach unable to follow commands)  Tracheostomy (21% Fi02,trach shiley 34m uncuffed) Incontinent, External catheter (External Urinary catheter) Weight: 122 lb (55.3 kg) Height:  5' 4"$  (162.6 cm)  BEHAVIORAL SYMPTOMS/MOOD NEUROLOGICAL BOWEL NUTRITION STATUS      Incontinent Diet, Feeding tube (Please see discharge summary)  AMBULATORY STATUS COMMUNICATION OF NEEDS Skin       (intubated/trach) Other (Comment) (Erythema,Leg,Bil.,Ecchymosis,Leg,Bil.,Abrasion,shoulder,R,Wound/Incision LDAs,PI Sacrum,Mid,upper stage 2,PI Ear,R,Upper stage 2,Incision closed, neck,other,Incision closed,Abdomen,other)                       Personal Care Assistance Level of Assistance  Bathing, Feeding, Dressing Bathing Assistance: Maximum assistance Feeding assistance: Maximum assistance (Peg Tube,feeding supplement (OSMOLITE 1.5 CAL) liquid 1,000 mL,65 mL/hr Dose: 1000 mL,continuous) Dressing Assistance: Maximum assistance     Functional Limitations Info  Sight, Hearing, Speech     Speech Info: Impaired (intubated/trach)    SPECIAL CARE FACTORS FREQUENCY                       Contractures Contractures Info: Not present    Additional Factors Info  Code Status, Allergies, Psychotropic Code Status Info: FULL Allergies Info: NKA Psychotropic Info: levETIRAcetam (KEPPRA) 100 MG/ML solution 1,500 mg 2 times daily,propranolol (INDERAL) tablet 20 mg every 8 hours         Current Medications (11/28/2022):  This is the current hospital active medication list Current Facility-Administered Medications  Medication Dose Route Frequency Provider Last Rate Last Admin   0.9 %  sodium chloride infusion  250 mL Intravenous Continuous Meuth, Brooke A, PA-C   Stopped at 11/05/22 2230   acetaminophen (TYLENOL) 160 MG/5ML solution 650 mg  650 mg Per Tube Q6H PRN Meuth, Brooke A, PA-C   650 mg at 11/28/22 1250   Or   acetaminophen (TYLENOL) suppository 650 mg  650 mg Rectal Q6H PRN Meuth, Brooke A, PA-C   650 mg at 10/04/22 0820   enoxaparin (LOVENOX) injection 40 mg  40 mg Subcutaneous Q24H Meuth,  Brooke A, PA-C   40 mg at 11/27/22 1941   famotidine (PEPCID) tablet 20 mg  20 mg Per Tube QHS Meuth, Brooke A, PA-C   20 mg at 11/27/22 2234   feeding supplement (OSMOLITE 1.5 CAL) liquid 1,000 mL  1,000 mL Per Tube Continuous Hunsucker, Bonna Gains, MD 65 mL/hr at 11/28/22 0700 Infusion  Verify at 11/28/22 0700   feeding supplement (PROSource TF20) liquid 60 mL  60 mL Per Tube BID Meuth, Brooke A, PA-C   60 mL at 11/28/22 0815   fentaNYL (SUBLIMAZE) injection 50 mcg  50 mcg Intravenous Q15 min PRN Meuth, Brooke A, PA-C   50 mcg at 10/26/22 0924   fentaNYL (SUBLIMAZE) injection 50-100 mcg  50-100 mcg Intravenous Q30 min PRN Meuth, Brooke A, PA-C   100 mcg at 10/30/22 [id number]   free water 200 mL  200 mL Per Tube Q4H Vance Gather B, MD   200 mL at 11/28/22 1151   gabapentin (NEURONTIN) 250 MG/5ML solution 100 mg  100 mg Per Tube Q8H Meuth, Brooke A, PA-C   100 mg at 11/28/22 0514   guaiFENesin tablet 400 mg  400 mg Per Tube Q8H Howerter, Justin B, DO   400 mg at 11/28/22 1359   ipratropium-albuterol (DUONEB) 0.5-2.5 (3) MG/3ML nebulizer solution 3 mL  3 mL Nebulization Q6H PRN Meuth, Brooke A, PA-C       leptospermum manuka honey (MEDIHONEY) paste 1 Application  1 Application Topical QHS Margaretha Seeds, MD   1 Application at [id number] 2200   levETIRAcetam (KEPPRA) 100 MG/ML solution 1,500 mg  1,500 mg Per Tube BID Meuth, Brooke A, PA-C   1,500 mg at 11/28/22 0820   metoprolol tartrate (LOPRESSOR) injection 2.5 mg  2.5 mg Intravenous Q3H PRN Meuth, Brooke A, PA-C   2.5 mg at 11/28/22 1021   nutrition supplement (JUVEN) (JUVEN) powder packet 1 packet  1 packet Per Tube BID BM Maryjane Hurter, MD   1 packet at 11/28/22 1403   ondansetron (ZOFRAN) tablet 4 mg  4 mg Oral Q6H PRN Meuth, Brooke A, PA-C       Or   ondansetron (ZOFRAN) injection 4 mg  4 mg Intravenous Q6H PRN Meuth, Brooke A, PA-C       Oral care mouth rinse  15 mL Mouth Rinse 4 times per day Elodia [place]., MD   15 mL at 11/28/22 1151   Oral care mouth rinse  15 mL Mouth Rinse PRN Elodia [place]., MD       oxyCODONE (Oxy IR/ROXICODONE) immediate release tablet 5 mg  5 mg Per Tube Q4H PRN Meuth, Brooke A, PA-C   5 mg at 11/18/22 0956   propranolol (INDERAL) tablet 20 mg  20 mg Per Tube Q8H Vance Gather B, MD    20 mg at 11/28/22 1402   sennosides (SENOKOT) 8.8 MG/5ML syrup 10 mL  10 mL Per Tube BID Vance Gather B, MD   10 mL at 11/28/22 [id number]   valproic acid (DEPAKENE) 250 MG/5ML solution 500 mg  500 mg Per Tube Q8H Meuth, Brooke A, PA-C   500 mg at 11/28/22 1409     Discharge Medications: Please see discharge summary for a list of discharge medications.  Relevant Imaging Results:  Relevant Lab Results:   Additional Information [id number]  Milas Gain, LCSWA

## 2022-11-28 NOTE — Evaluation (Signed)
Occupational Therapy Evaluation Patient Details Name: Keylon Wharff MRN: [id number] DOB: 02/13/1961 Today's Date: 11/28/2022   History of Present Illness PT is a 62 yo male admitted to [facility] with flu on Oct 21, 2022.  Pt coded in hosptial with resulting anoxic brain injury. PT with ARDS.  Pt with trach and peg placed.  PMH: HLD, HTN, back pain.   Clinical Impression   Pt admitted with the above diagnosis and has been in hospital with deficits listed below since 12/23.  Pt is not following any commands and only responded to pain stimuli in L nailbed.  Pt sat on EOB with total assist x2 and was unable to follow any commands or participate in therapy to any level.  Feel long term SNF would be best option for this pt due to being totally dependent in care.  If family chooses to take pt home, pt will need hospital bed with air mattress due to skin issues and being dependent in mobility, hoyer lift if pt plans on being out of bed and care will need to be taken to preserve skin as well as education regarding nursing tasks to care for pt with trach and peg. Will sign off of acute OT at this time as pt is unable to participate in therapy at this time.  If status changes, please reorder OT services.      Recommendations for follow up therapy are one component of a multi-disciplinary discharge planning process, led by the attending physician.  Recommendations may be updated based on patient status, additional functional criteria and insurance authorization.   Follow Up Recommendations  Skilled nursing-short term rehab (<3 hours/day)     Assistance Recommended at Discharge Frequent or constant Supervision/Assistance  Patient can return home with the following Two people to help with walking and/or transfers;Two people to help with bathing/dressing/bathroom;Assistance with cooking/housework;Assistance with feeding;Direct supervision/assist for medications management;Direct supervision/assist for financial  management;Assist for transportation;Help with stairs or ramp for entrance    Functional Status Assessment  Patient has had a recent decline in their functional status and/or demonstrates limited ability to make significant improvements in function in a reasonable and predictable amount of time  Equipment Recommendations  Hospital bed;Other (comment) (hoyer, air mattress)    Recommendations for Other Services       Precautions / Restrictions Restrictions Weight Bearing Restrictions: No      Mobility Bed Mobility Overal bed mobility: Needs Assistance Bed Mobility: Rolling, Supine to Sit, Sit to Supine Rolling: Total assist   Supine to sit: Total assist, +2 for physical assistance Sit to supine: Total assist, +2 for physical assistance   General bed mobility comments: Pt fully dependent    Transfers Overall transfer level: Needs assistance Equipment used: 2 person hand held assist               General transfer comment: unable.      Balance Overall balance assessment: Needs assistance Sitting-balance support: Feet supported Sitting balance-[name]: Zero         Standing balance comment: unable                           ADL either performed or assessed with clinical judgement   ADL Overall ADL's : Needs assistance/impaired Eating/Feeding: Total assistance   Grooming: Total assistance   Upper Body Bathing: Total assistance   Lower Body Bathing: Total assistance   Upper Body Dressing : Total assistance   Lower Body Dressing: Total assistance  Toilet Transfer: Total assistance   Toileting- Clothing Manipulation and Hygiene: Total assistance       Functional mobility during ADLs: Total assistance;+2 for physical assistance General ADL Comments: Pt completely dependent with all mobilty and adls following no commands.     Vision Baseline Vision/History:  (unsure) Patient Visual Report: Other (comment) (unable to evaluate. Pt did  open eyes during session at times. Pt could not track or keep them open for long.) Vision Assessment?:  (unable to assess) Additional Comments: Pt opened eyes wide one time when passively moving neck. Pt did not track or follow and commands to test vision.     Perception Perception Perception Tested?: No   Praxis Praxis Praxis tested?: Not tested    Pertinent Vitals/Pain Pain Assessment Pain Assessment: Faces Faces Pain Scale: Hurts a little bit Pain Location: Pt withdrew to pain in L foot nailbed and had wide eyes with passive neck movement. Pain Descriptors / Indicators: Other (Comment) (withdrew to pain) Pain Intervention(s): Monitored during session     Hand Dominance     Extremity/Trunk Assessment Upper Extremity Assessment Upper Extremity Assessment: RUE deficits/detail;LUE deficits/detail RUE Deficits / Details: PROM WFL. No active movement noted. RUE Coordination: decreased fine motor;decreased gross motor LUE Deficits / Details: PROM WFL No active movement noted LUE Coordination: decreased fine motor;decreased gross motor   Lower Extremity Assessment Lower Extremity Assessment: Defer to PT evaluation   Cervical / Trunk Assessment Cervical / Trunk Assessment: Normal   Communication     Cognition Arousal/Alertness: Lethargic Behavior During Therapy:  (unresponsive) Overall Cognitive Status: Difficult to assess                                 General Comments: pt not responding to anything other than  pain in L foot nailbed. Not following commands, tracking, or communicating in any form.     General Comments  Pt not following commands, not responding to anything but pain stim in L foot.  No participation and fully dependent with all mobilty. Pt with pad on buttocks so unsure of sacral condition of skin.  Otherwise, skin appears intact.    Exercises     Shoulder Instructions      Home Living Family/patient expects to be discharged to:: Private  residence Living Arrangements: Spouse/significant other;Children Available Help at Discharge: Family;Available 24 hours/day                             Additional Comments: unusure of home set up or needs. Pt unable to answer questions and no family present.      Prior Functioning/Environment Prior Level of Function : Other (comment) (unsure)                        OT Problem List: Decreased strength;Impaired balance (sitting and/or standing);Impaired vision/perception;Decreased coordination;Decreased cognition;Decreased safety awareness;Decreased knowledge of use of DME or AE;Decreased knowledge of precautions;Cardiopulmonary status limiting activity;Impaired UE functional use      OT Treatment/Interventions:      OT Goals(Current goals can be found in the care plan section) Acute Rehab OT Goals Patient Stated Goal: unable OT Goal Formulation: All assessment and education complete, DC therapy  OT Frequency:      Co-evaluation PT/OT/SLP Co-Evaluation/Treatment: Yes Reason for Co-Treatment: Complexity of the patient's impairments (multi-system involvement) PT goals addressed during session: Mobility/safety with mobility OT goals addressed  during session: ADL's and self-care      AM-PAC OT "6 Clicks" Daily Activity     Outcome Measure Help from another person eating meals?: Total Help from another person taking care of personal grooming?: Total Help from another person toileting, which includes using toliet, bedpan, or urinal?: Total Help from another person bathing (including washing, rinsing, drying)?: Total Help from another person to put on and taking off regular upper body clothing?: Total Help from another person to put on and taking off regular lower body clothing?: Total 6 Click Score: 6   End of Session Equipment Utilized During Treatment: Oxygen Nurse Communication: Mobility status  Activity Tolerance: Other (comment) (pt nonresponsive) Patient  left: in bed;with call bell/phone within reach  OT Visit Diagnosis: Other symptoms and signs involving the nervous system [id number])                [id number] OT Time Calculation (min): 19 min Charges:  OT General Charges $OT Visit: 1 Visit OT Evaluation $OT Eval Low Complexity: 1 Low  Glenford Peers 11/28/2022, 9:05 AM

## 2022-11-28 NOTE — Plan of Care (Signed)
   Problem: Clinical Measurements: Goal: Ability to maintain clinical measurements within normal limits will improve Outcome: Progressing   Problem: Clinical Measurements: Goal: Diagnostic test results will improve Outcome: Progressing   Problem: Clinical Measurements: Goal: Respiratory complications will improve Outcome: Progressing   Problem: Clinical Measurements: Goal: Cardiovascular complication will be avoided Outcome: Progressing   Problem: Elimination: Goal: Will not experience complications related to bowel motility Outcome: Progressing   Problem: Elimination: Goal: Will not experience complications related to urinary retention Outcome: Progressing   Problem: Pain Managment: Goal: General experience of comfort will improve Outcome: Progressing   Problem: Safety: Goal: Ability to remain free from injury will improve Outcome: Progressing   Problem: Skin Integrity: Goal: Risk for impaired skin integrity will decrease Outcome: Progressing

## 2022-11-28 NOTE — TOC Progression Note (Addendum)
Transition of Care [facility]) - Progression Note    Patient Details  Name: Damon [name] MRN: [id number] Date of Birth: 09-28-1961  Transition of [facility]) CM/SW [place], [place] Phone Number: 11/28/2022, 1:38 PM  Clinical Narrative:     CSW received message from RN that patients spouse wanted to speak with CSW . CSW spoke with patients spouse at bedside. Patients spouse and CSW discussed SNF placement for patient. Patients spouse informed CSW she would still like for patient to be transferred to [facility] , but is open and agreeable for CSW to fax patient out for Trach/SNF to see what her options are in regards to facility placement. Patients spouse also agreeable for CSW to reach out to financial counseling to screen patient for medicaid. CSW informed patients spouse that CSW informed MD of her request for transfer and in regards to trach/SNF placement that CSW will follow up with her once SNF bed offers are in, as well as when CSW hears back from financial counseling.Patients spouse thanked CSW. All questions answered. No further questions reported at this time. CSW will continue to follow and assist with patients dc planning needs.   Expected Discharge Plan:  (TBD)    Expected Discharge Plan and Services In-house Referral: Clinical Social Work Discharge Planning Services: CM Consult Post Acute Care Choice: NA Living arrangements for the past 2 months: [place]                   DME Agency: NA                   Social Determinants of Health (SDOH) Interventions [place]: No Food Insecurity (10/04/2022)  Housing: Low Risk  (10/04/2022)  Transportation Needs: No Transportation Needs (10/04/2022)  Utilities: Not At Risk (10/04/2022)  Tobacco Use: High Risk (11/02/2022)    Readmission Risk Interventions     No data to display

## 2022-11-28 NOTE — Progress Notes (Signed)
[facility] PROGRESS NOTE  Damon [name] (DOB: [birth year]) [id number] PCP: [facility]  Brief Narrative: Damon [name] is a 62 y.o. male with a history of HTN, HLD, tobacco use, chronic back pain who presented to the [facility] on 10/03/2022 with influenza A. He's had a protracted hospitalization summarized below.  Significant Events 12/18 Flu A positive 12/20 Admitted, bipap, 5 minute code MRSA PCR - neg Urine strep _POS 12/21 Intubated, paralyzed, ARDS protocol, on 7cc/kg 12/22 Weaned to 6 cc/kg with dyssynchrony, back on 7 cc/kg, driving pressures good, weaning vent 12/23 Attempt to wean sedation again with dyssynchrony and desaturation.  Some concern for cuff leak, tube exchange.  Cuff did not appear to be blown.  Ongoing signs of cuff leak, query leaking around cough, possible large trachea, diuresing 12/25 tolerating PSV, myoclonus> ceribell, Neuro consult, changed to propofol, diuresed  12/26 Changed to DNR. On Propofol, versed.  Tolerating PSV.  MRI brain with hypoxic/anoxic injury Blood culture neg 12/27 GPD's on EEG, pending transfer to [facility] for cEEG NEURO CONSULT 12/28 moved to [facility] for LTM EEG 12/31 family updated by neuro: no chance for meaningful recovery, they should make plans for comfort measures 1/1 cultures - resp normal flora 1/2 eeg still with epileptogenicity. Palli fam mtg -- family does not want to hear from medical teams unless there is "good news"  1/3  cEEG with burst suppression w highly epileptiform bursts.  1/4 cEEG with burst suppression  + highly epileptiform bursts still.  WBC slightly up to 22.5. Temp high 99 low 100  c-EEG stopped duie to lack of benefit Pallative care consult - wife upset about early dc from ER on 12/18 Chaplain consult: Wife is sufering from anticipatory grief + accepting god's way + curious/anger about events leading upto current state EEG: generalized epileptogenicity with high potential for seizures as well as  profound diffuse encephalopathy. In the setting of cardiac arrest, this EEG pattern is suggestive of anoxic/hypoxic brain injury.  1/5 - Low grade fever +. Kaycee 22.5K.  on Zosyn. On vent fio2 30%. TF +. Per RN - diprivan stopped yesterday. No myoclonus today. Mild tachycardia + Rpt pall care on 10/21/22 per wife 1/6 - On vent FiO2 30%.  Tmax 99,30F ., WBC better. Still off diprivan -> No myoclonus. cXR visualized - devices in place and faiure clear Oral oxy and klonpin stopped 1/8 No significant changes in neuro status. Tachycardic to 130s, hypertensive to 140s. Coreg transitioned to metoprolol. 1/11 wife considering trach/peg, to discuss with surgery-discussed with Dr. Bobbye Morton 1/12 more tachycardic 1/16-spoke with wife at length at bedside, Dr. Hulen Skains also had a long conversation with spouse-desires to continue current aggressive care and desires PEG and trach placed 1/18 perc trach and peg 11/05/2022 transferred to floor due to need for ICU bed 11/08/2022 transition to [facility] with pulmonary following weekly for trach 2/4 recurrent fever, cultures collected. 2/5 changed to cuffless trach  Subjective: Not responsive, no events reported by overnight RN.  Objective: BP 113/76 (BP Location: Left Leg)   Pulse 96   Temp 98.2 F (36.8 C) (Axillary)   Resp (!) 21   Ht 5' 4"$  (1.626 m)   Wt 53.1 kg   SpO2 100%   BMI 20.08 kg/m   Not meaningfully responsive Clear, no significant secretions at time of exam thru trach, nonlabored, stable oxygenation, slight tachypnea is stable RRR, borderline tachycardia, no pitting edema Abd binder in place, no tenderness suspected, no distention, +BS, PEG site c/d/I.  No new  wounds on visualized skin. No staff available at time of exam for decubitus reexamination this morning.  Assessment & Plan: Goals of Care: IPAL from 1/24 reviewed. He had been DNR, but transitioned back to full code. - Remains full code per extensive discussions with  patient's spouse.  - We will continue all aggressive medical work up/interventions as indicated. Pt's spouse has inquired about transfer to other medical institutions. I offered to pursue this, though shared that given there are no specialty services that this facility lacks to adequately care for Damon [name], I don't have a medical indication to request transfer. She has in the past not requested that I call.    Acute hypoxic respiratory failure, ARDS due to influenza A (+12/18) and superimposed Pneumococcal pneumonia: - s/p tamiflu 12/21-12/25 and ceftriaxone 12/20 - 12/26. Then had zosyn x7 days starting 12/31 per report. 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 Appreciate PCCM involvement for management. Site appears stable, oxygenation stable.    PEA arrest:  - Continue cardiac monitoring, electrolyte monitoring.   Anoxic brain injury Myoclonic Seizure activity  Neurology discussed with family on 12/31. 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  Signed off 12/31. - Continue keppra, depakene   Recurrent Fevers  Leukocytosis: Intermittent fevers throughout hospital stay. Again febrile 2/4. No change to exam of wound or lungs, no change to oxygenation. Last true fever 1/25 while on cefepime (1/22 - 1/28). Respiratory cultures 1/1, 1/9, 1/13, 1/21 with normal flora. Blood cultures 12/26, 1/13, 1/21, 1/29, 2/4 no growth. Had coag negative staph at presentation, suspect that was contaminant. - Continues low grade temperature elevations intermittently (no significant fevers lately) and persistent leukocytosis though this has stabilized off antibiotics  - No indwelling foley or central lines. Decubitus does not appear infected. Continue monitoring off abx for now.   Constipation: Abd benign - Continue scheduled senokot. Having regular BMs now.  Elevated LFT's: Mild, stable.  No cholestatic elevations. - Monitor in intermittently.    Paroxysmal sympathetic hyperactivity: Causing tachycardia/hypotension.  - Continue propranolol and neurontin     Severe protein calorie malnutrition:  - Continue tube feeds thru PEG placed 11/01/2022. At goal rate and tolerating.    Prediabetes: HbA1c 5.9% on 12/20.  - SSI q4h   Hypernatremia: Mild, resolved with increased free water. Will decrease thise siven his hyponatremia now. suspect increased insensible losses with occasional fever.  - Increased free water to 255m q4h. Recheck in AM.   Pressure Ulcers Pressure Injury 10/12/22 Sacrum Mid;Upper Unstageable - Full thickness tissue loss in which the base of the injury is covered by slough (yellow, tan, gray, green or brown) and/or eschar (tan, brown or black) in the wound bed. (Active)  10/12/22 0200  Location: Sacrum  Location Orientation: Mid;Upper  Staging: Unstageable - Full thickness tissue loss in which the base of the injury is covered by slough (yellow, tan, gray, green or brown) and/or eschar (tan, brown or black) in the wound bed.  Wound Description (Comments):   Present on Admission: Yes     Pressure Injury 10/23/22 Ear Right;Upper Stage 2 -  Partial thickness loss of dermis presenting as a shallow open injury with a red, pink wound bed without slough. (Active)  10/23/22 2009  Location: Ear  Location Orientation: Right;Upper  Staging: Stage 2 -  Partial thickness loss of dermis presenting as a shallow open injury with a red, pink wound bed without slough.  Wound Description (Comments):  Present on Admission: No  - Continue local wound care. Reordered turn q2h for offloading. Optimize nutritional status.   Patrecia Pour, MD [facility] [email or web address] 11/20/2022, 10:34 AM

## 2022-11-28 NOTE — Evaluation (Signed)
Physical Therapy Evaluation & Discharge Patient Details Name: Damon [name] MRN: [id number] DOB: November 20, 1960 Today's Date: 11/28/2022  History of Present Illness  PT is a 62 yo male admitted to [facility] with flu on 04-Oct-2022. Pt coded 10-05-2023 and was intubated. Trach and PEG placed 11/01/22. MRI of brain revealed "Fairly symmetric diffusion-weighted and T2 FLAIR hyperintense  signal abnormality within the bilateral basal ganglia. Subtle signal  changes are also suspected within the bilateral hippocampi. Given  the provided history, these findings are compatible with acute  hypoxic/ischemic injury". PMH: HLD, HTN, back pain.   Clinical Impression  Pt presents with condition above. No family present during session and pt did not attempt to communicate during session to provide home or PLOF information. Pt did not follow any commands or attempt to visually track throughout the session. His vitals remained stable with HR from 107-114 bpm. The only response or movement pt demonstrated was a withdrawal of his L leg to painful stimulation at the L nail bed and he opened his eyes wider with PROM of his neck into R lateral flexion (tightness in his L lateral neck musculature noted). Pt's extremities were otherwise flaccid with PROM WFL and no other active movement noted. Pt required TA x2 for all bed mobility and for sitting balance, with no righting reactions noted. Recommending pt d/c to SNF, likely for long-term institutional care, but if pt's family declines and chooses to take him home then recommend then DME listed below. As pt is unable to actively participate in therapy, PT will sign off. If pt's presentation changes/improves, please re-consult PT services.      Recommendations for follow up therapy are one component of a multi-disciplinary discharge planning process, led by the attending physician.  Recommendations may be updated based on patient status, additional functional criteria and insurance  authorization.  Follow Up Recommendations Skilled nursing-short term rehab (<3 hours/day) (likely long-term institutional care) Can patient physically be transported by private vehicle: No    Assistance Recommended at Discharge Frequent or constant Supervision/Assistance  Patient can return home with the following  Two people to help with walking and/or transfers;Two people to help with bathing/dressing/bathroom;Assistance with cooking/housework;Direct supervision/assist for medications management;Direct supervision/assist for financial management;Assistance with feeding;Assist for transportation;Help with stairs or ramp for entrance    Equipment Recommendations Wheelchair (measurements PT);Wheelchair cushion (measurements PT);Hospital bed;Other (comment) (air mattress overlay; hoyer lift and pad)  Recommendations for Other Services       Functional Status Assessment Patient has had a recent decline in their functional status and/or demonstrates limited ability to make significant improvements in function in a reasonable and predictable amount of time     Precautions / Restrictions Precautions Precautions: Fall;Other (comment) Precaution Comments: PEG (abdominal binder); Trach; watch vitals Restrictions Weight Bearing Restrictions: No      Mobility  Bed Mobility Overal bed mobility: Needs Assistance Bed Mobility: Rolling, Supine to Sit, Sit to Supine Rolling: Total assist   Supine to sit: Total assist, +2 for physical assistance Sit to supine: Total assist, +2 for physical assistance   General bed mobility comments: Pt fully dependent    Transfers                   General transfer comment: unable    Ambulation/Gait               General Gait Details: unable  Stairs            Wheelchair Mobility    Modified Rankin (Stroke Patients  Only)       Balance Overall balance assessment: Needs assistance Sitting-balance support: Feet  supported Sitting balance-[name]: Zero Sitting balance - Comments: No active attempts from pt to support his own body weight or hold his balance, no reactional strategies, TA       Standing balance comment: unable                             Pertinent Vitals/Pain Pain Assessment Pain Assessment: Faces Faces Pain Scale: Hurts a little bit Pain Location: Pt withdrew to pain in L foot nailbed and had wide eyes with passive neck movement. Pain Descriptors / Indicators: Other (Comment) (withdrew to pain) Pain Intervention(s): Limited activity within patient's tolerance, Monitored during session    [place] expects to be discharged to:: Private residence Living Arrangements: Spouse/significant other;Children Available Help at Discharge: Family;Available 24 hours/day               Additional Comments: unusure of home set up or needs. Pt unable to answer questions and no family present.    Prior Function Prior Level of Function : Other (comment) (unsure)                     Hand Dominance        Extremity/Trunk Assessment   Upper Extremity Assessment Upper Extremity Assessment: Defer to OT evaluation RUE Deficits / Details: PROM WFL. No active movement noted. RUE Coordination: decreased fine motor;decreased gross motor LUE Deficits / Details: PROM WFL No active movement noted LUE Coordination: decreased fine motor;decreased gross motor    Lower Extremity Assessment Lower Extremity Assessment: RLE deficits/detail;LLE deficits/detail RLE Deficits / Details: PROM WFL; flaccid; no active movement or withdrawal to tactile/painful/noxious stimulation LLE Deficits / Details: PROM WFL; flaccid; withdrew to tactile/painful/noxious stimulation at nail bed, but otherwise no other active movement noted    Cervical / Trunk Assessment Cervical / Trunk Assessment: Other exceptions Cervical / Trunk Exceptions: tightness in L lateral neck  musculature  Communication   Communication: Other (comment) (no attempts to communicate during session and no command following)  Cognition Arousal/Alertness: Lethargic Behavior During Therapy:  (unresponsive) Overall Cognitive Status: Difficult to assess                                 General Comments: pt not responding to anything other than  pain in L foot nailbed. Not following commands, tracking, or communicating in any form.        General Comments General comments (skin integrity, edema, etc.): Pt not following commands, not responding to anything but pain stim in L foot. No participation and fully dependent with all mobilty. Pt with pad on buttocks so unsure of sacral condition of skin. Otherwise, skin appears intact. HR 107-114 bpm throughout session    Exercises Other Exercises Other Exercises: PROM to neck into R lateral flexion Other Exercises: PROM to bil hips and knees into combined flexion and then combined extension, 10x supine; PROM to bil ankles into all directions, 10x supine; PROM to bil hips into abduction and adductions, 10x supine   Assessment/Plan    PT Assessment Patient does not need any further PT services  PT Problem List         PT Treatment Interventions      PT Goals (Current goals can be found in the Care Plan section)  Acute Rehab  PT Goals Patient Stated Goal: did not state PT Goal Formulation: Patient unable to participate in goal setting Time For Goal Achievement: 11/29/22 Potential to Achieve Goals: Poor    Frequency       Co-evaluation PT/OT/SLP Co-Evaluation/Treatment: Yes Reason for Co-Treatment: Complexity of the patient's impairments (multi-system involvement) PT goals addressed during session: Mobility/safety with mobility;Strengthening/ROM;Balance OT goals addressed during session: ADL's and self-care       AM-PAC PT "6 Clicks" Mobility  Outcome Measure Help needed turning from your back to your side while in  a flat bed without using bedrails?: Total Help needed moving from lying on your back to sitting on the side of a flat bed without using bedrails?: Total Help needed moving to and from a bed to a chair (including a wheelchair)?: Total Help needed standing up from a chair using your arms (e.g., wheelchair or bedside chair)?: Total Help needed to walk in hospital room?: Total Help needed climbing 3-5 steps with a railing? : Total 6 Click Score: 6    End of Session Equipment Utilized During Treatment: Oxygen Activity Tolerance: Other (comment) (limited by pt's inability to follow commands) Patient left: in bed;with call bell/phone within reach;with bed alarm set   PT Visit Diagnosis: Muscle weakness (generalized) (M62.81);Difficulty in walking, not elsewhere classified (R26.2);Other symptoms and signs involving the nervous system [id number])    Time: [id number] PT Time Calculation (min) (ACUTE ONLY): 17 min   Charges:   PT Evaluation $PT Eval Moderate Complexity: 1 Mod          Moishe Spice, PT, DPT Acute Rehabilitation Services  Office: [phone]   Orvan Falconer 11/28/2022, 9:52 AM

## 2022-11-29 ENCOUNTER — Inpatient Hospital Stay (HOSPITAL_COMMUNITY): Payer: Medicare Other

## 2022-11-29 DIAGNOSIS — J9601 Acute respiratory failure with hypoxia: Secondary | ICD-10-CM | POA: Diagnosis not present

## 2022-11-29 LAB — GLUCOSE, CAPILLARY
Glucose-Capillary: 107 mg/dL — ABNORMAL HIGH (ref 70–99)
Glucose-Capillary: 118 mg/dL — ABNORMAL HIGH (ref 70–99)
Glucose-Capillary: 127 mg/dL — ABNORMAL HIGH (ref 70–99)
Glucose-Capillary: 133 mg/dL — ABNORMAL HIGH (ref 70–99)
Glucose-Capillary: 136 mg/dL — ABNORMAL HIGH (ref 70–99)
Glucose-Capillary: 145 mg/dL — ABNORMAL HIGH (ref 70–99)

## 2022-11-29 NOTE — Progress Notes (Signed)
[facility] PROGRESS NOTE  Damon [name] (DOB: July 24, 1961) [id number]:[id number] PCP: [facility]  Brief Narrative: Damon [name] is a 62 y.o. male with a history of HTN, HLD, tobacco use, chronic back pain who presented to the [facility] on 10/03/2022 with influenza A. He's had a protracted hospitalization summarized below.  Significant Events 12/18 Flu A positive 12/20 Admitted, bipap, 5 minute code MRSA PCR - neg Urine strep _POS 12/21 Intubated, paralyzed, ARDS protocol, on 7cc/kg 12/22 Weaned to 6 cc/kg with dyssynchrony, back on 7 cc/kg, driving pressures good, weaning vent 12/23 Attempt to wean sedation again with dyssynchrony and desaturation.  Some concern for cuff leak, tube exchange.  Cuff did not appear to be blown.  Ongoing signs of cuff leak, query leaking around cough, possible large trachea, diuresing 12/25 tolerating PSV, myoclonus> ceribell, Neuro consult, changed to propofol, diuresed  12/26 Changed to DNR. On Propofol, versed.  Tolerating PSV.  MRI brain with hypoxic/anoxic injury Blood culture neg 12/27 GPD's on EEG, pending transfer to [facility] for cEEG NEURO CONSULT 12/28 moved to [facility] for LTM EEG 12/31 family updated by neuro: no chance for meaningful recovery, they should make plans for comfort measures 1/1 cultures - resp normal flora 1/2 eeg still with epileptogenicity. Palli fam mtg -- family does not want to hear from medical teams unless there is "good news"  1/3  cEEG with burst suppression w highly epileptiform bursts.  1/4 cEEG with burst suppression  + highly epileptiform bursts still.  WBC slightly up to 22.5. Temp high 99 low 100  c-EEG stopped duie to lack of benefit Pallative care consult - wife upset about early dc from ER on 12/18 Chaplain consult: Wife is sufering from anticipatory grief + accepting god's way + curious/anger about events leading upto current state EEG: generalized epileptogenicity with high potential for seizures as well as  profound diffuse encephalopathy. In the setting of cardiac arrest, this EEG pattern is suggestive of anoxic/hypoxic brain injury.  1/5 - Low grade fever +. [place] 22.5K.  on Zosyn. On vent fio2 30%. TF +. Per RN - diprivan stopped yesterday. No myoclonus today. Mild tachycardia + Rpt pall care on 10/21/22 per wife 1/6 - On vent FiO2 30%.  Tmax 99,68F ., WBC better. Still off diprivan -> No myoclonus. cXR visualized - devices in place and faiure clear Oral oxy and klonpin stopped 1/8 No significant changes in neuro status. Tachycardic to 130s, hypertensive to 140s. Coreg transitioned to metoprolol. 1/11 wife considering trach/peg, to discuss with surgery-discussed with Dr. Bobbye Morton 1/12 more tachycardic 1/16-spoke with wife at length at bedside, Dr. Hulen Skains also had a long conversation with spouse-desires to continue current aggressive care and desires PEG and trach placed 1/18 perc trach and peg 11/05/2022 transferred to floor due to need for ICU bed 11/08/2022 transition to [facility] with pulmonary following weekly for trach 2/4 recurrent fever, cultures collected. 2/5 changed to cuffless trach  Subjective: No changes reported by staff. Pt remains unresponsive. CSW speaking with Mrs. Ransom.  Objective: BP 100/78 (BP Location: Left Arm)   Pulse (!) 122   Temp 98.8 F (37.1 C) (Axillary)   Resp 19   Ht 5' 4"$  (1.626 m)   Wt 55.3 kg   SpO2 100%   BMI 20.93 kg/m   Gen: Not meaningfully responsive, does not track or follow commands or move spontaneously. Neck: White secretions from trach. Pulm: Clear, nonlabored  CV: Regular tachycardia.  GI: Soft, +BS, nondistended Neuro: Not meaningfully responsive, does not track  or follow commands or move spontaneously. Ext: Warm, no deformities Skin: No new rashes, lesions or ulcers on visualized skin. PEG site c/d/i  Assessment & Plan: Goals of Care: IPAL from 1/24 reviewed. He had been DNR, but transitioned back to full code. -  Remains full code per extensive discussions with patient's spouse.  - We will continue all aggressive medical work up/interventions as indicated. Pt's spouse has inquired about transfer to other medical institutions. I offered to pursue this, though shared that given there are no specialty services that this facility lacks to adequately care for [name], I don't have a medical indication to request transfer. She has in the past not requested that I call.    Acute hypoxic respiratory failure, ARDS due to influenza A (+12/18) and superimposed Pneumococcal pneumonia: - s/p tamiflu 12/21-12/25 and ceftriaxone 12/20 - 12/26. Then had zosyn x7 days starting 12/31 per report. 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 Appreciate PCCM involvement for management. Site appears stable, oxygenation stable.    PEA arrest:  - Continue cardiac monitoring, electrolyte monitoring.   Anoxic brain injury Myoclonic Seizure activity  Neurology discussed with family on 12/31. 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  Signed off 12/31. - Continue keppra, depakene   Recurrent Fevers  Leukocytosis: Intermittent fevers throughout hospital stay. Again febrile 2/4. No change to exam of wound or lungs, no change to oxygenation. Last true fever 1/25 while on cefepime (1/22 - 1/28). Respiratory cultures 1/1, 1/9, 1/13, 1/21 with normal flora. Blood cultures 12/26, 1/13, 1/21, 1/29, 2/4 no growth. Had coag negative staph at presentation, suspect that was contaminant. - Continues low grade temperature elevations intermittently (no significant fevers lately) and persistent leukocytosis though this has stabilized off antibiotics  - No indwelling foley or central lines. Decubitus does not appear infected. Continue monitoring off abx for now. - Recheck CXR, possibly sputum culture given persistent tachycardia,  increased secretions.    Constipation: Abd benign - Continue scheduled senokot. Having regular BMs now.  Elevated LFT's: Mild, stable. No cholestatic elevations. - Monitor in intermittently.    Paroxysmal sympathetic hyperactivity: Causing sinus tachycardia, hypotension.  - Continue propranolol and neurontin.      Severe protein calorie malnutrition:  - Continue tube feeds thru PEG placed 11/01/2022. At goal rate and tolerating.    Prediabetes: HbA1c 5.9% on 12/20.  - SSI q4h   Hypernatremia: Mild, resolved with increased free water. Will decrease thise siven his hyponatremia now. suspect increased insensible losses with occasional fever.  - Increased free water to 259m q4h. Recheck in AM.   Pressure Ulcers Pressure Injury 10/12/22 Sacrum Mid;Upper Unstageable - Full thickness tissue loss in which the base of the injury is covered by slough (yellow, tan, gray, green or brown) and/or eschar (tan, brown or black) in the wound bed. (Active)  10/12/22 0200  Location: Sacrum  Location Orientation: Mid;Upper  Staging: Unstageable - Full thickness tissue loss in which the base of the injury is covered by slough (yellow, tan, gray, green or brown) and/or eschar (tan, brown or black) in the wound bed.  Wound Description (Comments):   Present on Admission: Yes     Pressure Injury 10/23/22 Ear Right;Upper Stage 2 -  Partial thickness loss of dermis presenting as a shallow open injury with a red, pink wound bed without slough. (Active)  10/23/22 2009  Location: Ear  Location Orientation: Right;Upper  Staging: Stage 2 -  Partial  thickness loss of dermis presenting as a shallow open injury with a red, pink wound bed without slough.  Wound Description (Comments):   Present on Admission: No  - Continue local wound care. Turn q2h for offloading. Optimize nutritional status.   Patrecia Pour, MD [facility] [email or web address] 11/29/2022, 1:01 PM

## 2022-11-29 NOTE — TOC Progression Note (Signed)
Transition of Care [facility]) - Progression Note    Patient Details  Name: [name] MRN: [id number] Date of Birth: 12/26/1960  Transition of Care [facility]) CM/SW [place], [place] Phone Number: 11/29/2022, 1:35 PM  Clinical Narrative:     CSW received bed offer from [facility]. CSW called Juliann Pulse to confirm. Juliann Pulse asked how often patient is being suctioned. RN informed CSW every 2-4 hours. Juliann Pulse informed CSW they will not be able to offer but she is going to check with [facility] rehab to see if they can offer. Most facilities will not accept if patient is requiring suctioning more frequently than every 4 hours. CSW will continue to follow and assist with patients dc planning needs.   Expected Discharge Plan:  (TBD)    Expected Discharge Plan and Services In-house Referral: Clinical Social Work Discharge Planning Services: CM Consult Post Acute Care Choice: NA Living arrangements for the past 2 months: [place]                   DME Agency: NA                   Social Determinants of Health (SDOH) Interventions [place]: No Food Insecurity (10/04/2022)  Housing: Low Risk  (10/04/2022)  Transportation Needs: No Transportation Needs (10/04/2022)  Utilities: Not At Risk (10/04/2022)  Tobacco Use: High Risk (11/02/2022)    Readmission Risk Interventions     No data to display

## 2022-11-30 DIAGNOSIS — J9601 Acute respiratory failure with hypoxia: Secondary | ICD-10-CM | POA: Diagnosis not present

## 2022-11-30 LAB — COMPREHENSIVE METABOLIC PANEL
ALT: 73 U/L — ABNORMAL HIGH (ref 0–44)
AST: 41 U/L (ref 15–41)
Albumin: 2.2 g/dL — ABNORMAL LOW (ref 3.5–5.0)
Alkaline Phosphatase: 90 U/L (ref 38–126)
Anion gap: 7 (ref 5–15)
BUN: 42 mg/dL — ABNORMAL HIGH (ref 6–20)
CO2: 29 mmol/L (ref 22–32)
Calcium: 9 mg/dL (ref 8.9–10.3)
Chloride: 99 mmol/L (ref 98–111)
Creatinine, Ser: 0.55 mg/dL — ABNORMAL LOW (ref 0.61–1.24)
GFR, Estimated: >60 mL/min (ref >60)
Glucose, Bld: 136 mg/dL — ABNORMAL HIGH (ref 70–99)
Potassium: 4.3 mmol/L (ref 3.5–5.1)
Sodium: 135 mmol/L (ref 135–145)
Total Bilirubin: <0.1 mg/dL — ABNORMAL LOW (ref 0.3–1.2)
Total Protein: 6.7 g/dL (ref 6.5–8.1)

## 2022-11-30 LAB — GLUCOSE, CAPILLARY
Glucose-Capillary: 100 mg/dL — ABNORMAL HIGH (ref 70–99)
Glucose-Capillary: 132 mg/dL — ABNORMAL HIGH (ref 70–99)
Glucose-Capillary: 134 mg/dL — ABNORMAL HIGH (ref 70–99)
Glucose-Capillary: 142 mg/dL — ABNORMAL HIGH (ref 70–99)
Glucose-Capillary: 142 mg/dL — ABNORMAL HIGH (ref 70–99)
Glucose-Capillary: 144 mg/dL — ABNORMAL HIGH (ref 70–99)

## 2022-11-30 LAB — CBC
HCT: 34 % — ABNORMAL LOW (ref 39.0–52.0)
Hemoglobin: 11 g/dL — ABNORMAL LOW (ref 13.0–17.0)
MCH: 28.4 pg (ref 26.0–34.0)
MCHC: 32.4 g/dL (ref 30.0–36.0)
MCV: 87.9 fL (ref 80.0–100.0)
Platelets: 258 10*3/uL (ref 150–400)
RBC: 3.87 MIL/uL — ABNORMAL LOW (ref 4.22–5.81)
RDW: 15.9 % — ABNORMAL HIGH (ref 11.5–15.5)
WBC: 11.4 10*3/uL — ABNORMAL HIGH (ref 4.0–10.5)
nRBC: 0.2 % (ref 0.0–0.2)

## 2022-11-30 NOTE — Progress Notes (Signed)
Nutrition Follow-up  DOCUMENTATION CODES:   Not applicable  INTERVENTION:  Continue tube feeding via G-tube: -Osmolite 1.5 at 65 mL/hour (1560 mL goal daily volume) -PROSource TF20 60 mL BID per tube -Provides: 2500 kcal, 137 grams of protein, 1185 mL H2O daily  With current free water flush of 200 mL every 4 hours, pt receives a total of 2385 mL H2O daily including water in tube feeds.  Continue Juven 1 packet per tube BID. Each packet provides 95 kcal, 7 grams L-Arginine, 7 grams L-Glutamine, 2.5 grams collagen protein, 300 mg vitamin C, 9.5 mg zinc, and other micronutrients essential for wound healing.    NUTRITION DIAGNOSIS:   Inadequate oral intake related to inability to eat as evidenced by NPO status.  Ongoing - addressing with TF regimen.  GOAL:   Patient will meet greater than or equal to 90% of their needs  Met with TF regimen.  MONITOR:   Labs, Weight trends, TF tolerance, Skin, I & O's  REASON FOR ASSESSMENT:   Consult Enteral/tube feeding initiation and management  ASSESSMENT:   Pt admitted from home with the flu leading to acute respiratory failure with hypoxia and PEA arrest upon admission. PMH significant for HTN, HLD, chronic back pain, tobacco use.  12/18: Flu A positive 12/20: admitted, 5 minute code after non-compliance with BiPAP 12/21: intubated, paralyzed, ARDS protocol 12/26: MRI brain with hypoxic/anoxic injury  12/28: transfer from [facility] to [facility] 4N 1/18: s/p tracheostomy tube placement and G-tube placement 1/21: tube feeds changed from Osmolite 1.5 to Vital 1.5 due to shortage of Osmolite 1.5 1/22: transfer from 4N to [place]; rectal tube removed; free water flush reduced to 200 mL every 8 hours 1/24: water flushes changed to 150 mL every 4 hours   2/5: water flush increased to 200 mL every 4 hours per MD; changed to cuffless trach 2/7: water flush increased to 250 mL every 4 hours per MD and then later increased to 300 mL every 3 hours per MD 2/10:  water flush changed to 250 mL every 4 hours per MD  2/14: water flush changed to 200 mL every 4 hours per MD  No family members present at time of RD assessment. Pt on trach collar 6 L/min 21% FiO2. Pt is not verbally responsive. Discussed with RN. Pt continues to tolerate tube feed regimen well. Per RN wound appears stable. Per skin documentation wound to sacrum is in early/partial granulation and wound to ear is epithelialized. Pending placement plan.  Admission wt was 64.9 kg. Weights trended down this admission. Pt was 52.1 kg on 10/24/22 and 51.1 kg on 11/02/22. Calories were increased on 10/30/22. Most recent weight 57.6 kg on 11/30/22. However, wt was 55.3 kg on 11/29/22 so will continue to monitor.  Enteral Access: 24 Fr. G-tube placed 1/18  Tube Feeds: Osmolite 1.5 at 65 mL/hour + PROSource TF20 60 mL BID + free water flush 200 mL every 4 hours  Medications reviewed and include: famotidine, gabapentin, Medihoney, Keppra, Juven BID, propranolol, sennosides, valproic acid  Labs reviewed: CBG 100-142, BUN 42, Creatinine 0.55. Sodium 135 WNL and Chloride 99 WNL  UOP: 1250 mL (0.9 mL/kg/hr)  I/O: +12713.7 mL since 11/16/22 (unsure of accuracy)  Diet Order:   Diet Order     None      EDUCATION NEEDS:   No education needs have been identified at this time  Skin:  Skin Assessment: Skin Integrity Issues: Skin Integrity Issues:: Stage II, Unstageable Stage II: right upper ear - epithelialized  Unstageable: sacrum (8 cm x 1.5 cm x 0 cm) early/partial granulation  Last BM:  11/28/22 - large brown BM  Height:   Ht Readings from Last 1 Encounters:  10/11/22 5' 4"$  (1.626 m)   Weight:   Wt Readings from Last 1 Encounters:  11/30/22 57.6 kg   BMI:  Body mass index is 21.8 kg/m.  Estimated Nutritional Needs:   Kcal:  2400-2600  Protein:  120-140 grams  Fluid:  >/= 2 L/day  Loanne Drilling, MS, RD, LDN, CNSC Pager number available on Amion

## 2022-11-30 NOTE — Progress Notes (Signed)
[facility] PROGRESS NOTE  Decorius [name] (DOB: 16-Apr-1961) XO:[id number] PCP: [facility]  Brief Narrative: Damon [name] is a 62 y.o. male with a history of HTN, HLD, tobacco use, chronic back pain who presented to the [facility] on 10/03/2022 with influenza A. He's had a protracted hospitalization summarized below.  Significant Events 12/18 Flu A positive 12/20 Admitted, bipap, 5 minute code MRSA PCR - neg Urine strep _POS 12/21 Intubated, paralyzed, ARDS protocol, on 7cc/kg 12/22 Weaned to 6 cc/kg with dyssynchrony, back on 7 cc/kg, driving pressures good, weaning vent 12/23 Attempt to wean sedation again with dyssynchrony and desaturation.  Some concern for cuff leak, tube exchange.  Cuff did not appear to be blown.  Ongoing signs of cuff leak, query leaking around cough, possible large trachea, diuresing 12/25 tolerating PSV, myoclonus> ceribell, Neuro consult, changed to propofol, diuresed  12/26 Changed to DNR. On Propofol, versed.  Tolerating PSV.  MRI brain with hypoxic/anoxic injury Blood culture neg 12/27 GPD's on EEG, pending transfer to [facility] for cEEG NEURO CONSULT 12/28 moved to [facility] for LTM EEG 12/31 family updated by neuro: no chance for meaningful recovery, they should make plans for comfort measures 1/1 cultures - resp normal flora 1/2 eeg still with epileptogenicity. Palli fam mtg -- family does not want to hear from medical teams unless there is "good news"  1/3  cEEG with burst suppression w highly epileptiform bursts.  1/4 cEEG with burst suppression  + highly epileptiform bursts still.  WBC slightly up to 22.5. Temp high 99 low 100  c-EEG stopped duie to lack of benefit Pallative care consult - wife upset about early dc from ER on 12/18 Chaplain consult: Wife is sufering from anticipatory grief + accepting god's way + curious/anger about events leading upto current state EEG: generalized epileptogenicity with high potential for seizures as well as  profound diffuse encephalopathy. In the setting of cardiac arrest, this EEG pattern is suggestive of anoxic/hypoxic brain injury.  1/5 - Low grade fever +. [place] 22.5K.  on Zosyn. On vent fio2 30%. TF +. Per RN - diprivan stopped yesterday. No myoclonus today. Mild tachycardia + Rpt pall care on 10/21/22 per wife 1/6 - On vent FiO2 30%.  Tmax 99,65F ., WBC better. Still off diprivan -> No myoclonus. cXR visualized - devices in place and faiure clear Oral oxy and klonpin stopped 1/8 No significant changes in neuro status. Tachycardic to 130s, hypertensive to 140s. Coreg transitioned to metoprolol. 1/11 wife considering trach/peg, to discuss with surgery-discussed with Dr. Bobbye Morton 1/12 more tachycardic 1/16-spoke with wife at length at bedside, Dr. Hulen Skains also had a long conversation with spouse-desires to continue current aggressive care and desires PEG and trach placed 1/18 perc trach and peg 11/05/2022 transferred to floor due to need for ICU bed 11/08/2022 transition to [facility] with pulmonary following weekly for trach 2/4 recurrent fever, cultures collected. 2/5 changed to cuffless trach  Subjective: No new events. RN and NT in to assist with wound exam today.   Objective: BP 106/84 (BP Location: Left Arm)   Pulse (!) 106   Temp 99.2 F (37.3 C) (Axillary)   Resp 15   Ht 5' 4"$  (1.626 m)   Wt 57.6 kg   SpO2 100%   BMI 21.80 kg/m   No distress, not responsive Normal rate and effort, clear, white secretions thru trach Regular borderline tachycardia without MRG or pitting edema Soft, ND, +BS Flaccid extremities, WWP Shallow longitudinally oriented wound in sacral/gluteal cleft area is  stable without exudate, bleeding, induration or erythema.  Assessment & Plan: Goals of Care: IPAL from 1/24 reviewed. He had been DNR, but transitioned back to full code. - Remains full code per extensive discussions with patient's spouse.  - We will continue all aggressive medical work  up/interventions as indicated. Pt's spouse has inquired about transfer to other medical institutions. I offered to pursue this, though shared that given there are no specialty services that this facility lacks to adequately care for Mr. Damon [name], I don't have a medical indication to request transfer. She has in the past not requested that I call. The patient is also stable enough for a lower level of care at this time, so transfer to another hospital is not indicated.   Acute hypoxic respiratory failure, ARDS due to influenza A (+12/18) and superimposed Pneumococcal pneumonia: - s/p tamiflu 12/21-12/25 and ceftriaxone 12/20 - 12/26. Then had zosyn x7 days starting 12/31 per report. 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 Appreciate PCCM involvement for management. Site appears stable, oxygenation stable. - Having frequent suctioning of white secretions which is too frequent (more than every 4 hours) for SNF's to extend bed offers. CXR stable, WBC coming down, no purulence noted. I appreciate PCCM reassessment and recommendations.   PEA arrest:  - Continue cardiac monitoring, electrolyte monitoring.   Anoxic brain injury Myoclonic Seizure activity  Neurology discussed with family on 12/31. 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  Signed off 12/31. - Continue keppra, depakene   Recurrent Fevers  Leukocytosis: Intermittent fevers throughout hospital stay. Again febrile 2/4. No change to exam of wound or lungs, no change to oxygenation. Last true fever 1/25 while on cefepime (1/22 - 1/28). Respiratory cultures 1/1, 1/9, 1/13, 1/21 with normal flora. Blood cultures 12/26, 1/13, 1/21, 1/29, 2/4 no growth. Had coag negative staph at presentation, suspect that was contaminant. - Continues low grade temperature elevations intermittently (no significant fevers lately) and persistent  leukocytosis though this has stabilized off antibiotics  - No indwelling foley or central lines. Decubitus does not appear infected, reexamined again today. Continue monitoring off abx for now. - CXR without new findings, remains afebrile currently and wound appears uninfected at this time. WBC down to 11.   Constipation: Abd benign - Continue scheduled senokot. Having regular BMs now.  Elevated LFT's: Mild, stable. No cholestatic elevations. - Monitor in intermittently.    Paroxysmal sympathetic hyperactivity: Causing sinus tachycardia, hypotension.  - Continue propranolol and neurontin.      Severe protein calorie malnutrition:  - Continue tube feeds thru PEG placed 11/01/2022. At goal rate and tolerating.    Prediabetes: HbA1c 5.9% on 12/20.  - SSI q4h   Hypernatremia: Mild, resolved with increased free water. Will decrease thise siven his hyponatremia now. suspect increased insensible losses with occasional fever.  - Increased free water to 259m q4h. Recheck in AM.   Pressure Ulcers Pressure Injury 10/12/22 Sacrum Mid;Upper Unstageable - Full thickness tissue loss in which the base of the injury is covered by slough (yellow, tan, gray, green or brown) and/or eschar (tan, brown or black) in the wound bed. (Active)  10/12/22 0200  Location: Sacrum  Location Orientation: Mid;Upper  Staging: Unstageable - Full thickness tissue loss in which the base of the injury is covered by slough (yellow, tan, gray, green or brown) and/or eschar (tan, brown or black) in the wound bed.  Wound Description (Comments):   Present on Admission:  Yes     Pressure Injury 10/23/22 Ear Right;Upper Stage 2 -  Partial thickness loss of dermis presenting as a shallow open injury with a red, pink wound bed without slough. (Active)  10/23/22 2009  Location: Ear  Location Orientation: Right;Upper  Staging: Stage 2 -  Partial thickness loss of dermis presenting as a shallow open injury with a red, pink wound bed  without slough.  Wound Description (Comments):   Present on Admission: No  - Continue local wound care. Turn q2h for offloading. Optimize nutritional status.   Patrecia Pour, MD [facility] [email or web address] 11/30/2022, 11:18 AM

## 2022-11-30 NOTE — TOC Progression Note (Addendum)
Transition of Care [facility]) - Progression Note    Patient Details  Name: Damon [name] MRN: [id number] Date of Birth: 1961-05-27  Transition of Care [facility]) CM/SW [place], [place] Phone Number: 11/30/2022, 3:06 PM  Clinical Narrative:     No current SNF bed offers. CSW followed up with Juliann Pulse in regards to [facility] to see if they can make SNF offer for patient. Juliann Pulse informed CSW she will give CSW call back in regards to possible offer. CSW Lvm with [facility] in [place] to see if they can offer SNF bed for patient. CSW awaiting to hear back. CSW awaiting to hear back from [facility] in [place] on referral that was sent. CSW updated patients spouse Damon [name].CSW will continue to follow and assist with patients dc planning needs.   Expected Discharge Plan:  (TBD)    Expected Discharge Plan and Services In-house Referral: Clinical Social Work Discharge Planning Services: CM Consult Post Acute Care Choice: NA Living arrangements for the past 2 months: [place]                   DME Agency: NA                   Social Determinants of Health (SDOH) Interventions [place]: No Food Insecurity (10/04/2022)  Housing: Low Risk  (10/04/2022)  Transportation Needs: No Transportation Needs (10/04/2022)  Utilities: Not At Risk (10/04/2022)  Tobacco Use: High Risk (11/02/2022)    Readmission Risk Interventions     No data to display

## 2022-11-30 NOTE — Progress Notes (Signed)
NAME:  Damon [name], MRN:  [id number], DOB:  03/21/1961, LOS: 29 ADMISSION DATE:  10/03/2022, CONSULTATION DATE:  10/03/2022 REFERRING MD:  Marylyn Ishihara - [facility] CHIEF COMPLAINT:  Dyspnea   History of Present Illness:  62 year old man admitted to [facility] 12/20 in the setting of acute hypoxemic respiratory failure due to Flu A. PMHx significant for HTN, HLD, chronic back pain, tobacco abuse   Placed on BIPAP.  Coded (likely in the setting of hypoxia), initial rhythm PEA with CPR x 5 minutes, required intubation.  After several days on mechanical ventilation remains comatose, MRI Brain 12/26 worrisome for anoxic brain injury.  Transferred to [facility] for LTM EEG monitoring.  Neurology consulted. LTM with generalized epileptogenicity. Treated with Propofol, Depakote, Keppra, Ketamine. 12/31 Neurology with goals of care with family. Relayed that patient has a grim neurological recovery due to irreversible anoxic injury. Family wished to continue aggressive measures.   Palliative care consulted. Stay complicated with neuro-storming.   Surgical consult regarding PEG/trach  Pertinent Medical History:   Past Medical History:  Diagnosis Date   Elevated lipids    Hypertension    Tobacco abuse    Significant Hospital Events: Including procedures, antibiotic start and stop dates in addition to other pertinent events   12/18 Flu A positive 12/20 Admitted, 5 minute code after non-compliance with BiPAP. MRSA PCR - neg, Urine strep > POS 12/21 Intubated, paralyzed, ARDS protocol, on 7cc/kg 12/22 Weaned to 6 cc/kg with dyssynchrony, back on 7 cc/kg, driving pressures good, weaning vent 12/23 Attempt to wean sedation again with dyssynchrony and desaturation.  Some concern for cuff leak, tube exchange.  Cuff did not appear to be blown.  Ongoing signs of cuff leak, query leaking around cough, possible large trachea, diuresing 12/25 tolerating PSV, myoclonus> ceribell, Neuro consult, changed to propofol, diuresed  12/26  Changed to DNR. On Propofol, versed.  Tolerating PSV.  MRI brain with hypoxic/anoxic injury. Blood culture neg 12/27 GPD's on EEG, pending transfer to [facility] for cEEG. NEURO CONSULT 12/28 moved to [facility] for LTM EEG 12/31 family updated by neuro: no chance for meaningful recovery, they should make plans for comfort measures 1/1 cultures - resp normal flora 1/2 eeg still with epileptogenicity. Palli fam mtg -- family does not want to hear from medical teams unless there is "good news"  1/3  cEEG with burst suppression w highly epileptiform bursts.  1/4 cEEG with burst suppression  + highly epileptiform bursts still.  WBC slightly up to 22.5. Temp high 99 low 100  c-EEG stopped due to lack of benefit Pallative care consult - wife upset about early dc from ER on 12/18 Chaplain consult: Wife is sufering from anticipatory grief + accepting god's way + curious/anger about events leading upto current state EEG: generalized epileptogenicity with high potential for seizures as well as profound diffuse encephalopathy. In the setting of cardiac arrest, this EEG pattern is suggestive of anoxic/hypoxic brain injury.  1/5 Low grade fever +. [place] 22.5K.  on Zosyn. On vent fio2 30%. TF +. Per RN - diprivan stopped yesterday. No myoclonus today. Mild tachycardia + Rpt pall care on 10/21/22 per wife 1/6 On vent FiO2 30%.  Tmax 99,83F ., WBC better. Still off diprivan -> No myoclonus. cXR visualized - devices in place and faiure clear. Oral oxy and klonpin stopped 1/8 No significant changes in neuro status. Tachycardic to 130s, hypertensive to 140s. Coreg transitioned to metoprolol. 1/11 wife considering trach/peg, to discuss with surgery-discussed with Dr. Bobbye Morton 1/12 more tachycardic 1/16-spoke with  wife at length at bedside, Dr. Hulen Skains also had a long conversation with spouse-desires to continue current aggressive care and desires PEG and trach placed 1/18 perc trach and peg 11/05/2022 transferred to floor due to need for  ICU bed 11/08/2022 transition to [facility] with pulmonary following weekly for trach 2/5 ATC 28%, no acute issues with trach   Interim History / Subjective:  No acute distress  Objective:  Blood pressure 106/84, pulse (!) 125, temperature 99.2 F (37.3 C), temperature source Axillary, resp. rate (!) 21, height 5' 4"$  (1.626 m), weight 57.6 kg, SpO2 100 %.    FiO2 (%):  [21 %] 21 %   Intake/Output Summary (Last 24 hours) at 11/30/2022 1145 Last data filed at 11/30/2022 0700 Gross per 24 hour  Intake 1502.58 ml  Output 1250 ml  Net 252.58 ml  BP 106/84 (BP Location: Left Arm)   Pulse (!) 125   Temp 99.2 F (37.3 C) (Axillary)   Resp (!) 21   Ht 5' 4"$  (1.626 m)   Wt 57.6 kg   SpO2 100%   BMI 21.80 kg/m   Filed Weights   11/28/22 0415 11/29/22 0353 11/30/22 0350  Weight: 55.3 kg 55.3 kg 57.6 kg   Physical Exam: General: Frail 62 year old male no acute distress HEENT: MM pink/moist #6 cuffless trach in place no obvious secretions at the time of examination Neuro: Unable to follow commands CV: Tachycardia with a rate of 125 PULM: O2 sats are adequate on trach collar   GI: soft, bsx4 active PEG tube in place  GU: Diaper  extremities: warm/dry, 1+ edema  Skin: no rashes or lesions   Resolved   Flu A AKI CAP, pneumococcal pneumonia - s/p Zosyn 12/31 - 1/7; Ceftriaxone 12/20- 12/27, Azithro 12/20, Flagyl 12/20 - 12/22  Assessment & Plan:   Anoxic brain injury, ventilator dependent s/p perc trach 1/18 Acute respiratory failure with hypoxia/ARDS secondary to flu and strep (completed rx) Seizures Paroxysmal sympathetic hyperactivity Fever Leukocytosis Anemia Hyperkalemia Tachycardia/hypotension due to paroxysmal sympathetic hyperactivity Severe protein calorie malnutrition hyperglycemia      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  Requires frequent suctioning. Not currently  a candidate for decannulation due to frequent suctioning and mental status post arrest.   11/30/2022 pulmonary called to bedside due to increasing secretions that are noted to be white and requiring suctioning every 2 hours.  Plan Do not do deep suctioning as this will increase the secretions Consider just suctioning at the end of the trachea and decrease the frequency He has the capacity to expel his own secretions at this time. He is not a candidate for decannulation anytime in the future Continue pulmonary hygiene Intermittent chest x-ray/ Continue trach collar    PCCM will see again week of 2/22, please call sooner if new needs arise.       Noe Gens, MSN, APRN, NP-C, AGACNP-BC [facility] 11/30/2022, 11:45 AM   Please see [email or web address] for pager details.   From 7A-7P if no response, please call [phone] After hours, please call ELink [phone]

## 2022-12-01 DIAGNOSIS — J9601 Acute respiratory failure with hypoxia: Secondary | ICD-10-CM | POA: Diagnosis not present

## 2022-12-01 LAB — GLUCOSE, CAPILLARY
Glucose-Capillary: 104 mg/dL — ABNORMAL HIGH (ref 70–99)
Glucose-Capillary: 132 mg/dL — ABNORMAL HIGH (ref 70–99)
Glucose-Capillary: 133 mg/dL — ABNORMAL HIGH (ref 70–99)
Glucose-Capillary: 135 mg/dL — ABNORMAL HIGH (ref 70–99)
Glucose-Capillary: 143 mg/dL — ABNORMAL HIGH (ref 70–99)
Glucose-Capillary: 96 mg/dL (ref 70–99)

## 2022-12-01 MED ORDER — PROPRANOLOL HCL 40 MG PO TABS
40.0000 mg | ORAL_TABLET | Freq: Three times a day (TID) | ORAL | Status: DC
  Administered 2022-12-01 – 2022-12-16 (×44): 40 mg
  Filled 2022-12-01 (×47): qty 1

## 2022-12-01 NOTE — Progress Notes (Signed)
[facility] PROGRESS NOTE  Damon [name] (DOB: 1961/08/25) [id number] PCP: [facility]  Brief Narrative: Damon [name] is a 62 y.o. male with a history of HTN, HLD, tobacco use, chronic back pain who presented to the [facility] on 10/03/2022 with influenza A. He's had a protracted hospitalization summarized below.  Significant Events 12/18 Flu A positive 12/20 Admitted, bipap, 5 minute code MRSA PCR - neg Urine strep _POS 12/21 Intubated, paralyzed, ARDS protocol, on 7cc/kg 12/22 Weaned to 6 cc/kg with dyssynchrony, back on 7 cc/kg, driving pressures good, weaning vent 12/23 Attempt to wean sedation again with dyssynchrony and desaturation.  Some concern for cuff leak, tube exchange.  Cuff did not appear to be blown.  Ongoing signs of cuff leak, query leaking around cough, possible large trachea, diuresing 12/25 tolerating PSV, myoclonus> ceribell, Neuro consult, changed to propofol, diuresed  12/26 Changed to DNR. On Propofol, versed.  Tolerating PSV.  MRI brain with hypoxic/anoxic injury Blood culture neg 12/27 GPD's on EEG, pending transfer to [facility] for cEEG NEURO CONSULT 12/28 moved to [facility] for LTM EEG 12/31 family updated by neuro: no chance for meaningful recovery, they should make plans for comfort measures 1/1 cultures - resp normal flora 1/2 eeg still with epileptogenicity. Palli fam mtg -- family does not want to hear from medical teams unless there is "good news"  1/3  cEEG with burst suppression w highly epileptiform bursts.  1/4 cEEG with burst suppression  + highly epileptiform bursts still.  WBC slightly up to 22.5. Temp high 99 low 100  c-EEG stopped duie to lack of benefit Pallative care consult - wife upset about early dc from ER on 12/18 Chaplain consult: Wife is sufering from anticipatory grief + accepting god's way + curious/anger about events leading upto current state EEG: generalized epileptogenicity with high potential for seizures as well as  profound diffuse encephalopathy. In the setting of cardiac arrest, this EEG pattern is suggestive of anoxic/hypoxic brain injury.  1/5 - Low grade fever +. [place] 22.5K.  on Zosyn. On vent fio2 30%. TF +. Per RN - diprivan stopped yesterday. No myoclonus today. Mild tachycardia + Rpt pall care on 10/21/22 per wife 1/6 - On vent FiO2 30%.  Tmax 99,11F ., WBC better. Still off diprivan -> No myoclonus. cXR visualized - devices in place and faiure clear Oral oxy and klonpin stopped 1/8 No significant changes in neuro status. Tachycardic to 130s, hypertensive to 140s. Coreg transitioned to metoprolol. 1/11 wife considering trach/peg, to discuss with surgery-discussed with Dr. Bobbye Morton 1/12 more tachycardic 1/16-spoke with wife at length at bedside, Dr. Hulen Skains also had a long conversation with spouse-desires to continue current aggressive care and desires PEG and trach placed 1/18 perc trach and peg 11/05/2022 transferred to floor due to need for ICU bed 11/08/2022 transition to [facility] with pulmonary following weekly for trach 2/4 recurrent fever, cultures collected. 2/5 changed to cuffless trach  Subjective: No new events, no fevers, no family at bedside.  Objective: BP 106/86 (BP Location: Left Arm)   Pulse (!) 130   Temp 99.6 F (37.6 C) (Axillary)   Resp 17   Ht 5' 4"$  (1.626 m)   Wt 56.7 kg   SpO2 100%   BMI 21.46 kg/m   Gen: Unresponsive, no distress Pulm: Clear lungs, white secretions in trach  CV: Regular tachycardia in 110-120's, no MRG or edema or JVD GI: Soft, NT, ND, +BS. PEG site c/d/i  Assessment & Plan: Goals of Care: IPAL from 1/24  reviewed. He had been DNR, but transitioned back to full code. - Remains full code per extensive discussions with patient's spouse.  - We will continue all aggressive medical work up/interventions as indicated. Pt's spouse has inquired about transfer to other medical institutions. I offered to pursue this, though shared that given  there are no specialty services that this facility lacks to adequately care for Damon [name], I don't have a medical indication to request transfer. She has in the past not requested that I call. The patient is also stable enough for a lower level of care at this time, so transfer to another hospital is not indicated.   Acute hypoxic respiratory failure, ARDS due to influenza A (+12/18) and superimposed Pneumococcal pneumonia: - s/p tamiflu 12/21-12/25 and ceftriaxone 12/20 - 12/26. Then had zosyn x7 days starting 12/31 per report. 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 Appreciate PCCM involvement for management. Site appears stable, oxygenation stable. - Having frequent suctioning of white secretions, check tracheal aspirate. CXR stable, WBC coming down. PCCM continues following. Will attempt to suction < q4h.  PEA arrest:  - Continue cardiac monitoring, electrolyte monitoring.   Anoxic brain injury Myoclonic Seizure activity  Neurology discussed with family on 12/31. 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  Signed off 12/31. - Continue keppra, depakene   Recurrent Fevers  Leukocytosis: Intermittent fevers throughout hospital stay. Again febrile 2/4. No change to exam of wound or lungs, no change to oxygenation. Last true fever 1/25 while on cefepime (1/22 - 1/28). Respiratory cultures 1/1, 1/9, 1/13, 1/21 with normal flora. Blood cultures 12/26, 1/13, 1/21, 1/29, 2/4 no growth. Had coag negative staph at presentation, suspect that was contaminant. - Continues low grade temperature elevations intermittently (no significant fevers lately) and persistent leukocytosis though this has stabilized off antibiotics  - No indwelling foley or central lines. Decubitus does not appear infected, reexamined again today. Continue monitoring off abx for now. - CXR without new findings,  remains afebrile currently and wound appears uninfected at this time. WBC down to 11.   Constipation: Abd benign - Continue scheduled senokot. Having regular BMs now.  Elevated LFT's: Mild, stable. No cholestatic elevations. - Monitor in intermittently.    Paroxysmal sympathetic hyperactivity: Causing sinus tachycardia, hypotension.  - Continue propranolol at 22m q8h dose and neurontin.      Severe protein calorie malnutrition:  - Continue tube feeds thru PEG placed 11/01/2022. At goal rate and tolerating.    Prediabetes: HbA1c 5.9% on 12/20.  - SSI q4h   Hypernatremia: Mild, resolved with increased free water.   - Continue free water [id number].    Pressure Ulcers Pressure Injury 10/12/22 Sacrum Mid;Upper Unstageable - Full thickness tissue loss in which the base of the injury is covered by slough (yellow, tan, gray, green or brown) and/or eschar (tan, brown or black) in the wound bed. (Active)  10/12/22 0200  Location: Sacrum  Location Orientation: Mid;Upper  Staging: Unstageable - Full thickness tissue loss in which the base of the injury is covered by slough (yellow, tan, gray, green or brown) and/or eschar (tan, brown or black) in the wound bed.  Wound Description (Comments):   Present on Admission: Yes     Pressure Injury 10/23/22 Ear Right;Upper Stage 2 -  Partial thickness loss of dermis presenting as a shallow open injury with a red, pink wound bed without slough. (Active)  10/23/22 2009  Location: Ear  Location  Orientation: Right;Upper  Staging: Stage 2 -  Partial thickness loss of dermis presenting as a shallow open injury with a red, pink wound bed without slough.  Wound Description (Comments):   Present on Admission: No  - Continue local wound care. Turn q2h for offloading. Optimize nutritional status.   Patrecia Pour, MD [facility] [email or web address] 12/01/2022, 1:35 PM

## 2022-12-02 DIAGNOSIS — J9601 Acute respiratory failure with hypoxia: Secondary | ICD-10-CM | POA: Diagnosis not present

## 2022-12-02 LAB — GLUCOSE, CAPILLARY
Glucose-Capillary: 113 mg/dL — ABNORMAL HIGH (ref 70–99)
Glucose-Capillary: 113 mg/dL — ABNORMAL HIGH (ref 70–99)
Glucose-Capillary: 115 mg/dL — ABNORMAL HIGH (ref 70–99)
Glucose-Capillary: 119 mg/dL — ABNORMAL HIGH (ref 70–99)
Glucose-Capillary: 127 mg/dL — ABNORMAL HIGH (ref 70–99)
Glucose-Capillary: 97 mg/dL (ref 70–99)

## 2022-12-02 MED ORDER — SODIUM CHLORIDE 0.9 % IV SOLN
2.0000 g | Freq: Three times a day (TID) | INTRAVENOUS | Status: DC
  Administered 2022-12-02 – 2022-12-03 (×2): 2 g via INTRAVENOUS
  Filled 2022-12-02 (×2): qty 12.5

## 2022-12-02 NOTE — Progress Notes (Signed)
[facility] PROGRESS NOTE  Damon [name] (DOB: 1961-03-20) [id number] PCP: [facility]  Brief Narrative: Damon [name] is a 62 y.o. male with a history of HTN, HLD, tobacco use, chronic back pain who presented to the [facility] on 10/03/2022 with influenza A. He's had a protracted hospitalization summarized below.  Significant Events 12/18 Flu A positive 12/20 Admitted, bipap, 5 minute code MRSA PCR - neg Urine strep _POS 12/21 Intubated, paralyzed, ARDS protocol, on 7cc/kg 12/22 Weaned to 6 cc/kg with dyssynchrony, back on 7 cc/kg, driving pressures good, weaning vent 12/23 Attempt to wean sedation again with dyssynchrony and desaturation.  Some concern for cuff leak, tube exchange.  Cuff did not appear to be blown.  Ongoing signs of cuff leak, query leaking around cough, possible large trachea, diuresing 12/25 tolerating PSV, myoclonus> ceribell, Neuro consult, changed to propofol, diuresed  12/26 Changed to DNR. On Propofol, versed.  Tolerating PSV.  MRI brain with hypoxic/anoxic injury Blood culture neg 12/27 GPD's on EEG, pending transfer to [facility] for cEEG NEURO CONSULT 12/28 moved to [facility] for LTM EEG 12/31 family updated by neuro: no chance for meaningful recovery, they should make plans for comfort measures 1/1 cultures - resp normal flora 1/2 eeg still with epileptogenicity. Palli fam mtg -- family does not want to hear from medical teams unless there is "good news"  1/3  cEEG with burst suppression w highly epileptiform bursts.  1/4 cEEG with burst suppression  + highly epileptiform bursts still.  WBC slightly up to 22.5. Temp high 99 low 100  c-EEG stopped duie to lack of benefit Pallative care consult - wife upset about early dc from ER on 12/18 Chaplain consult: Wife is sufering from anticipatory grief + accepting god's way + curious/anger about events leading upto current state EEG: generalized epileptogenicity with high potential for seizures as well as  profound diffuse encephalopathy. In the setting of cardiac arrest, this EEG pattern is suggestive of anoxic/hypoxic brain injury.  1/5 - Low grade fever +. [place] 22.5K.  on Zosyn. On vent fio2 30%. TF +. Per RN - diprivan stopped yesterday. No myoclonus today. Mild tachycardia + Rpt pall care on 10/21/22 per wife 1/6 - On vent FiO2 30%.  Tmax 99,37F ., WBC better. Still off diprivan -> No myoclonus. cXR visualized - devices in place and faiure clear Oral oxy and klonpin stopped 1/8 No significant changes in neuro status. Tachycardic to 130s, hypertensive to 140s. Coreg transitioned to metoprolol. 1/11 wife considering trach/peg, to discuss with surgery-discussed with Dr. Bobbye Morton 1/12 more tachycardic 1/16-spoke with wife at length at bedside, Dr. Hulen Skains also had a long conversation with spouse-desires to continue current aggressive care and desires PEG and trach placed 1/18 perc trach and peg 11/05/2022 transferred to floor due to need for ICU bed 11/08/2022 transition to [facility] with pulmonary following weekly for trach 2/4 recurrent fever, cultures collected. 2/5 changed to cuffless trach  Subjective: Still with white secretions, no family at bedside. Will call spouse to notify of treatment for pneumonia.  Objective: BP 108/78 (BP Location: Left Arm)   Pulse (!) 104   Temp 97.9 F (36.6 C) (Axillary)   Resp (!) 26   Ht 5' 4"$  (1.626 m)   Wt 56.7 kg   SpO2 100%   BMI 21.46 kg/m   Gen: No distress Pulm: Nonlabored, clear lung fields, white secretions thru trach  CV: Regular tachycardia with rate down into 100's.  GI: Soft, NT, ND, +BS, PEG site c/d/i Neuro: Unresponsive  Ext: Warm, dry Skin: No new rashes, lesions or ulcers on visualized skin   Assessment & Plan: Goals of Care: IPAL from 1/24 reviewed. He had been DNR, but transitioned back to full code. - Remains full code per extensive discussions with patient's spouse.  - We will continue all aggressive medical work  up/interventions as indicated. Pt's spouse has inquired about transfer to other medical institutions. I offered to pursue this, though shared that given there are no specialty services that this facility lacks to adequately care for Mr. [name], I don't have a medical indication to request transfer. She has in the past not requested that I call. The patient is also stable enough for a lower level of care at this time, so transfer to another hospital is not indicated.   Acute hypoxic respiratory failure, ARDS due to influenza A (+12/18) and superimposed Pneumococcal pneumonia: - s/p tamiflu 12/21-12/25 and ceftriaxone 12/20 - 12/26. Then had zosyn x7 days starting 12/31 per report. 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 Appreciate PCCM involvement for management. Site appears stable, oxygenation stable. - Having frequent suctioning of white secretions > tracheal aspirate with few pseudomonas, also GPC. Will initiate cefepime. If clinical status worsens or S. aureus grows, would add coverage for that as well. .  PEA arrest:  - Continue cardiac monitoring, electrolyte monitoring.   Anoxic brain injury Myoclonic Seizure activity  Neurology discussed with family on 12/31. 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  Signed off 12/31. - Continue keppra, depakene   Recurrent Fevers  Leukocytosis: Intermittent fevers throughout hospital stay. Again febrile 2/4. No change to exam of wound or lungs, no change to oxygenation. Last true fever 1/25 while on cefepime (1/22 - 1/28). Respiratory cultures 1/1, 1/9, 1/13, 1/21 with normal flora. Blood cultures 12/26, 1/13, 1/21, 1/29, 2/4 no growth. Had coag negative staph at presentation, suspect that was contaminant. - Continues low grade temperature elevations intermittently (no significant fevers lately) and persistent leukocytosis though  this improved without antibiotic management. Sputum culture checked 2/17 growing few pseudomonas. We will initiate cefepime coverage and monitor closely.   Pseudomonas HCAP:  - Start cefepime, monitor respiratory status which is thankfully currently stable. Will monitor susceptibility data closely.    Constipation: Abd benign - Continue scheduled senokot. Having regular BMs now.  Elevated LFT's: Mild, stable. No cholestatic elevations. - Monitor in intermittently.    Paroxysmal sympathetic hyperactivity: Causing sinus tachycardia, hypotension.  - Continue propranolol at 71m q8h dose and neurontin.      Severe protein calorie malnutrition:  - Continue tube feeds thru PEG placed 11/01/2022. At goal rate and tolerating.    Prediabetes: HbA1c 5.9% on 12/20.  - SSI q4h   Hypernatremia: Mild, resolved with increased free water.   - Continue free water 207mq4h.    Pressure Ulcers Pressure Injury 10/12/22 Sacrum Mid;Upper Unstageable - Full thickness tissue loss in which the base of the injury is covered by slough (yellow, tan, gray, green or brown) and/or eschar (tan, brown or black) in the wound bed. (Active)  10/12/22 0200  Location: Sacrum  Location Orientation: Mid;Upper  Staging: Unstageable - Full thickness tissue loss in which the base of the injury is covered by slough (yellow, tan, gray, green or brown) and/or eschar (tan, brown or black) in the wound bed.  Wound Description (Comments):   Present on Admission: Yes     Pressure Injury 10/23/22 Ear Right;Upper Stage 2 -  Partial thickness loss of dermis presenting as a shallow open injury with a red, pink wound bed without slough. (Active)  10/23/22 2009  Location: Ear  Location Orientation: Right;Upper  Staging: Stage 2 -  Partial thickness loss of dermis presenting as a shallow open injury with a red, pink wound bed without slough.  Wound Description (Comments):   Present on Admission: No  - Continue local wound care. Turn  q2h for offloading. Optimize nutritional status.   Patrecia Pour, MD [facility] [email or web address] 12/02/2022, 11:24 AM

## 2022-12-02 NOTE — Progress Notes (Signed)
Pharmacy Antibiotic Note  Damon [name] is a 62 y.o. male admitted on 10/03/2022 with hospital acquired pneumonia. Pharmacy has been consulted for cefepime dosing. Respiratory culture growing few pseudomonas and GPC. Per MD note if clinical status worsens or staph aureus grows will escalate therapy. Patient has been afebrile, WBC 14>11, Cr 0.55  Plan: Cefepime 2 g IV every 8 hours  Recommend repeat MRSA PCR Monitor for signs of clinical improvement, renal function, fever curve, WBC Follow up with respiratory culture for de-escalation of therapy and length  Height: 5' 4"$  (162.6 cm) Weight: 56.7 kg (125 lb) IBW/kg (Calculated) : 59.2  Temp (24hrs), Avg:99.3 F (37.4 C), Min:97.9 F (36.6 C), Max:100.2 F (37.9 C)  Recent Labs  Lab 11/26/22 0232 11/27/22 0846 11/30/22 0333  WBC 14.8* 14.7* 11.4*  CREATININE 0.45* 0.46* 0.55*    Estimated Creatinine Clearance: 78.8 mL/min (A) (by C-G formula based on SCr of 0.55 mg/dL (L)).    No Known Allergies  Antimicrobials this admission: 12/20 Azith x 1 12/20 CTX >>  12/26 12/20 Flagyl>>12/22 12/21 Tamiflu>>12/25 12/20 zosyn>12/20 Cefepime 1/22 >> 1/29, 2/18>>  Microbiology results: 2/4 BCx: NGTD 2/17 Sputum: Pseudomonas and GPC 1/17 MRSA PCR: negative   Thank you for allowing pharmacy to be a part of this patient's care.  Sandford Craze, PharmD. [facility] Acute Care PGY-1 12/02/2022 11:44 AM

## 2022-12-03 DIAGNOSIS — J9601 Acute respiratory failure with hypoxia: Secondary | ICD-10-CM | POA: Diagnosis not present

## 2022-12-03 LAB — GLUCOSE, CAPILLARY
Glucose-Capillary: 106 mg/dL — ABNORMAL HIGH (ref 70–99)
Glucose-Capillary: 109 mg/dL — ABNORMAL HIGH (ref 70–99)
Glucose-Capillary: 110 mg/dL — ABNORMAL HIGH (ref 70–99)
Glucose-Capillary: 110 mg/dL — ABNORMAL HIGH (ref 70–99)
Glucose-Capillary: 119 mg/dL — ABNORMAL HIGH (ref 70–99)
Glucose-Capillary: 123 mg/dL — ABNORMAL HIGH (ref 70–99)

## 2022-12-03 LAB — CULTURE, RESPIRATORY W GRAM STAIN

## 2022-12-03 LAB — COMPREHENSIVE METABOLIC PANEL
ALT: 65 U/L — ABNORMAL HIGH (ref 0–44)
AST: 39 U/L (ref 15–41)
Albumin: 2.5 g/dL — ABNORMAL LOW (ref 3.5–5.0)
Alkaline Phosphatase: 100 U/L (ref 38–126)
Anion gap: 11 (ref 5–15)
BUN: 38 mg/dL — ABNORMAL HIGH (ref 6–20)
CO2: 23 mmol/L (ref 22–32)
Calcium: 9.3 mg/dL (ref 8.9–10.3)
Chloride: 101 mmol/L (ref 98–111)
Creatinine, Ser: 0.63 mg/dL (ref 0.61–1.24)
GFR, Estimated: >60 mL/min (ref >60)
Glucose, Bld: 103 mg/dL — ABNORMAL HIGH (ref 70–99)
Potassium: 4.2 mmol/L (ref 3.5–5.1)
Sodium: 135 mmol/L (ref 135–145)
Total Bilirubin: 0.4 mg/dL (ref 0.3–1.2)
Total Protein: 6.8 g/dL (ref 6.5–8.1)

## 2022-12-03 LAB — CBC
HCT: 36 % — ABNORMAL LOW (ref 39.0–52.0)
Hemoglobin: 11.7 g/dL — ABNORMAL LOW (ref 13.0–17.0)
MCH: 28.5 pg (ref 26.0–34.0)
MCHC: 32.5 g/dL (ref 30.0–36.0)
MCV: 87.8 fL (ref 80.0–100.0)
Platelets: 263 10*3/uL (ref 150–400)
RBC: 4.1 MIL/uL — ABNORMAL LOW (ref 4.22–5.81)
RDW: 15.8 % — ABNORMAL HIGH (ref 11.5–15.5)
WBC: 9.5 10*3/uL (ref 4.0–10.5)
nRBC: 0.2 % (ref 0.0–0.2)

## 2022-12-03 MED ORDER — PIPERACILLIN-TAZOBACTAM 3.375 G IVPB
3.3750 g | Freq: Three times a day (TID) | INTRAVENOUS | Status: AC
  Administered 2022-12-03 – 2022-12-13 (×30): 3.375 g via INTRAVENOUS
  Filled 2022-12-03 (×32): qty 50

## 2022-12-03 NOTE — TOC Progression Note (Addendum)
Transition of Care [facility]) - Progression Note    Patient Details  Name: Varad Titman MRN: [id number] Date of Birth: [birth year]  Transition of Care [facility]) CM/SW [place], [place] Phone Number: 12/03/2022, 11:21 AM  Clinical Narrative:     CSW received call from patients spouse. Patients spouse informed CSW she has been unable to reach [place] in financial counseling. Patients spouse informed me she will be at the hospital until 2:30pm today. CSW informed patients spouse CSW will reach out to [place] in financial counseling to see if Clifton James can reach out to her today. All questions answered. No further questions reported at this time. CSW reached out to Shanon Rosser in financial counseling to see if Clifton James can give patients spouse a call. CSW spoke with Santiago Glad with [facility] in [place] and requested for her to review referral sent over. Santiago Glad confirmed she is going to review and follow up with CSW.CSW also waiting to hear back from [facility] on if they can offer trach/SNF bed for patient. CSW updated patients spouse.No current trach/SNF bed offers at this time.CSW will continue to follow and assist with patients dc planning needs.    Expected Discharge Plan:  (TBD)    Expected Discharge Plan and Services In-house Referral: Clinical Social Work Discharge Planning Services: CM Consult Post Acute Care Choice: NA Living arrangements for the past 2 months: [place]                   DME Agency: NA                   Social Determinants of Health (SDOH) Interventions [place]: No Food Insecurity (10/04/2022)  Housing: Low Risk  (10/04/2022)  Transportation Needs: No Transportation Needs (10/04/2022)  Utilities: Not At Risk (10/04/2022)  Tobacco Use: High Risk (11/02/2022)    Readmission Risk Interventions     No data to display

## 2022-12-03 NOTE — Progress Notes (Signed)
Per Dr. Silas Flood, pt's trach with be changed once a month. Next trach change will be March 5th 2024.

## 2022-12-03 NOTE — Progress Notes (Signed)
NAME:  Damon [name], MRN:  [id number], DOB:  14-Jun-1961, LOS: 78 ADMISSION DATE:  10/03/2022, CONSULTATION DATE:  10/03/2022 REFERRING MD:  Marylyn Ishihara - TRH CHIEF COMPLAINT:  Dyspnea   History of Present Illness:  62 year old man admitted to [facility] 12/20 in the setting of acute hypoxemic respiratory failure due to Flu A. PMHx significant for HTN, HLD, chronic back pain, tobacco abuse   Placed on BIPAP.  Coded (likely in the setting of hypoxia), initial rhythm PEA with CPR x 5 minutes, required intubation.  After several days on mechanical ventilation remains comatose, MRI Brain 12/26 worrisome for anoxic brain injury.  Transferred to [facility] for LTM EEG monitoring.  Neurology consulted. LTM with generalized epileptogenicity. Treated with Propofol, Depakote, Keppra, Ketamine. 12/31 Neurology with goals of care with family. Relayed that patient has a grim neurological recovery due to irreversible anoxic injury. Family wished to continue aggressive measures.   Palliative care consulted. Stay complicated with neuro-storming.   Surgical consult regarding PEG/trach  Pertinent Medical History:   Past Medical History:  Diagnosis Date   Elevated lipids    Hypertension    Tobacco abuse    Significant Hospital Events: Including procedures, antibiotic start and stop dates in addition to other pertinent events   12/18 Flu A positive 12/20 Admitted, 5 minute code after non-compliance with BiPAP. MRSA PCR - neg, Urine strep > POS 12/21 Intubated, paralyzed, ARDS protocol, on 7cc/kg 12/22 Weaned to 6 cc/kg with dyssynchrony, back on 7 cc/kg, driving pressures good, weaning vent 12/23 Attempt to wean sedation again with dyssynchrony and desaturation.  Some concern for cuff leak, tube exchange.  Cuff did not appear to be blown.  Ongoing signs of cuff leak, query leaking around cough, possible large trachea, diuresing 12/25 tolerating PSV, myoclonus> ceribell, Neuro consult, changed to propofol, diuresed  12/26  Changed to DNR. On Propofol, versed.  Tolerating PSV.  MRI brain with hypoxic/anoxic injury. Blood culture neg 12/27 GPD's on EEG, pending transfer to [facility] for cEEG. NEURO CONSULT 12/28 moved to [facility] for LTM EEG 12/31 family updated by neuro: no chance for meaningful recovery, they should make plans for comfort measures 1/1 cultures - resp normal flora 1/2 eeg still with epileptogenicity. Palli fam mtg -- family does not want to hear from medical teams unless there is "good news"  1/3  cEEG with burst suppression w highly epileptiform bursts.  1/4 cEEG with burst suppression  + highly epileptiform bursts still.  WBC slightly up to 22.5. Temp high 99 low 100  c-EEG stopped due to lack of benefit Pallative care consult - wife upset about early dc from ER on 12/18 Chaplain consult: Wife is sufering from anticipatory grief + accepting god's way + curious/anger about events leading upto current state EEG: generalized epileptogenicity with high potential for seizures as well as profound diffuse encephalopathy. In the setting of cardiac arrest, this EEG pattern is suggestive of anoxic/hypoxic brain injury.  1/5 Low grade fever +. [place] 22.5K.  on Zosyn. On vent fio2 30%. TF +. Per RN - diprivan stopped yesterday. No myoclonus today. Mild tachycardia + Rpt pall care on 10/21/22 per wife 1/6 On vent FiO2 30%.  Tmax 99,13F ., WBC better. Still off diprivan -> No myoclonus. cXR visualized - devices in place and faiure clear. Oral oxy and klonpin stopped 1/8 No significant changes in neuro status. Tachycardic to 130s, hypertensive to 140s. Coreg transitioned to metoprolol. 1/11 wife considering trach/peg, to discuss with surgery-discussed with Dr. Bobbye Morton 1/12 more tachycardic 1/16-spoke with  wife at length at bedside, Dr. Hulen Skains also had a long conversation with spouse-desires to continue current aggressive care and desires PEG and trach placed 1/18 perc trach and peg 11/05/2022 transferred to floor due to need for  ICU bed 11/08/2022 transition to [facility] with pulmonary following weekly for trach 2/5 ATC 28%, no acute issues with trach   Interim History / Subjective:  Having significant secretions and WBC increase.  Trach aspirate culture from 2/17 growing pseudomonas resistant to imipenem Remains on trach collar  Objective:  Blood pressure 119/87, pulse (!) 106, temperature 98.6 F (37 C), temperature source Axillary, resp. rate 20, height 5' 4"$  (1.626 m), weight 57.2 kg, SpO2 100 %.    FiO2 (%):  [21 %] 21 %   Intake/Output Summary (Last 24 hours) at 12/03/2022 1215 Last data filed at 12/03/2022 0549 Gross per 24 hour  Intake 1618.09 ml  Output 1450 ml  Net 168.09 ml   BP 119/87 (BP Location: Left Arm)   Pulse (!) 106   Temp 98.6 F (37 C) (Axillary)   Resp 20   Ht 5' 4"$  (1.626 m)   Wt 57.2 kg   SpO2 100%   BMI 21.63 kg/m   Filed Weights   11/30/22 0350 12/01/22 0500 12/03/22 0402  Weight: 57.6 kg 56.7 kg 57.2 kg   Physical Exam: General: Frail elderly appearing man in NAD HEENT: 6 cuffless in place. No obvious secretions.  Neuro: Unresponsive CV: Tachy, regular, no MRG PULM: NO distress. No obvious secretions on trach collar.  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  Requires frequent  suctioning. Not currently a candidate for decannulation due to frequent suctioning and mental status post arrest.   Course now complicated by pseudomonas PNA  Plan ABX per primary Try to limit suctioning as possible.  He has the capacity to expel his own secretions at this time. Maintain 6 cuffless trach Not a candidate for downsize or decannulation in the foreseeable future, if ever. Pulmonary hygiene Continue trach collar   PCCM will continue to follow intermittently for trach.        Georgann Housekeeper, AGACNP-BC [facility]  See Amion for personal pager PCCM on call pager [phone] until 7pm. Please call [place] 7p-7a. [phone]  12/03/2022 12:22 PM

## 2022-12-03 NOTE — Progress Notes (Signed)
Pharmacy Antibiotic Note  Damon [name] is a 62 y.o. male admitted on 10/03/2022 with hospital acquired pneumonia. Pharmacy has been consulted for cefepime changed to zosyn dosing.  WBC WNL at 9.5, afeb.  Trach aspirate showing few pseudomonas and GPC. Scr remains stable at 0.63 (CrCl 79 mL/min). Changing to zosyn to minimize risk for neurotoxicity.   Plan: Zosyn 3.375g IV every 8 hours  Monitor for signs of clinical improvement, renal function, fever curve, WBC Follow up with respiratory culture for de-escalation of therapy and length  Height: 5' 4"$  (162.6 cm) Weight: 57.2 kg (126 lb) IBW/kg (Calculated) : 59.2  Temp (24hrs), Avg:98.8 F (37.1 C), Min:97.5 F (36.4 C), Max:99.3 F (37.4 C)  Recent Labs  Lab 11/27/22 0846 11/30/22 0333 12/03/22 0247  WBC 14.7* 11.4* 9.5  CREATININE 0.46* 0.55* 0.63     Estimated Creatinine Clearance: 79.4 mL/min (by C-G formula based on SCr of 0.63 mg/dL).    No Known Allergies  Antimicrobials this admission: 12/20 Azith x 1 12/20 CTX >>  12/26 12/20 Flagyl>>12/22 12/21 Tamiflu>>12/25 12/20 zosyn>12/20, 2/19 >> Cefepime 1/22 >> 1/29, 2/18>> 2/19  Microbiology results: 2/4 BCx: NGTD 2/17 Sputum: Pseudomonas and GPC 1/17 MRSA PCR: negative   Thank you for allowing pharmacy to be a part of this patient's care.  Antonietta Jewel, PharmD, [place] Clinical Pharmacist  Phone: [phone] 12/03/2022 10:12 AM  Please check AMION for all [place] phone numbers After 10:00 PM, call [place] [phone]

## 2022-12-03 NOTE — Progress Notes (Signed)
[facility] PROGRESS NOTE  Damon [name] (DOB: [birth year]) [id number] PCP: [facility]  Brief Narrative: Damon [name] is a 62 y.o. male with a history of HTN, HLD, tobacco use, chronic back pain who presented to the [facility] on 10/03/2022 with influenza A. He's had a protracted hospitalization summarized below.  Significant Events 12/18 Flu A positive 12/20 Admitted, bipap, 5 minute code MRSA PCR - neg Urine strep _POS 12/21 Intubated, paralyzed, ARDS protocol, on 7cc/kg 12/22 Weaned to 6 cc/kg with dyssynchrony, back on 7 cc/kg, driving pressures good, weaning vent 12/23 Attempt to wean sedation again with dyssynchrony and desaturation.  Some concern for cuff leak, tube exchange.  Cuff did not appear to be blown.  Ongoing signs of cuff leak, query leaking around cough, possible large trachea, diuresing 12/25 tolerating PSV, myoclonus> ceribell, Neuro consult, changed to propofol, diuresed  12/26 Changed to DNR. On Propofol, versed.  Tolerating PSV.  MRI brain with hypoxic/anoxic injury Blood culture neg 12/27 GPD's on EEG, pending transfer to [facility] for cEEG NEURO CONSULT 12/28 moved to [facility] for LTM EEG 12/31 family updated by neuro: no chance for meaningful recovery, they should make plans for comfort measures 1/1 cultures - resp normal flora 1/2 eeg still with epileptogenicity. Palli fam mtg -- family does not want to hear from medical teams unless there is "good news"  1/3  cEEG with burst suppression w highly epileptiform bursts.  1/4 cEEG with burst suppression  + highly epileptiform bursts still.  WBC slightly up to 22.5. Temp high 99 low 100  c-EEG stopped duie to lack of benefit Pallative care consult - wife upset about early dc from ER on 12/18 Chaplain consult: Wife is sufering from anticipatory grief + accepting god's way + curious/anger about events leading upto current state EEG: generalized epileptogenicity with high potential for seizures as well as  profound diffuse encephalopathy. In the setting of cardiac arrest, this EEG pattern is suggestive of anoxic/hypoxic brain injury.  1/5 - Low grade fever +. [place] 22.5K.  on Zosyn. On vent fio2 30%. TF +. Per RN - diprivan stopped yesterday. No myoclonus today. Mild tachycardia + Rpt pall care on 10/21/22 per wife 1/6 - On vent FiO2 30%.  Tmax 99,74F ., WBC better. Still off diprivan -> No myoclonus. cXR visualized - devices in place and faiure clear Oral oxy and klonpin stopped 1/8 No significant changes in neuro status. Tachycardic to 130s, hypertensive to 140s. Coreg transitioned to metoprolol. 1/11 wife considering trach/peg, to discuss with surgery-discussed with Dr. Bobbye Morton 1/12 more tachycardic 1/16-spoke with wife at length at bedside, Dr. Hulen Skains also had a long conversation with spouse-desires to continue current aggressive care and desires PEG and trach placed 1/18 perc trach and peg 11/05/2022 transferred to floor due to need for ICU bed 11/08/2022 transition to [facility] service with pulmonary following weekly for trach 2/4 recurrent fever, cultures collected. 2/5 changed to cuffless trach  Subjective: No changes noted overnight. Tmax 100.63F.    Objective: BP 99/80 (BP Location: Left Arm)   Pulse (!) 104   Temp 98.8 F (37.1 C) (Axillary)   Resp 18   Ht 5' 4"$  (1.626 m)   Wt 57.2 kg   SpO2 100%   BMI 21.63 kg/m   Gen: Frail male in no distress, unresponsive Pulm: Clear at bases, white secretions coughed up and suctioned thru trach  CV: Regular borderline tachycardia, rate in 90-100's, no pitting edema GI: Soft, ND, +BS, PEG site c/d/i Ext: Warm, dry Skin: No  new rashes, lesions or ulcers on visualized skin. Not turned for sacral exam again today.  Assessment & Plan: Goals of Care: IPAL from 1/24 reviewed. He had been DNR, but transitioned back to full code. - Remains full code per extensive discussions with patient's spouse.  - We will continue all aggressive medical  work up/interventions as indicated. Pt's spouse has inquired about transfer to other medical institutions. I offered to pursue this, though shared that given there are no specialty services that this facility lacks to adequately care for Mr. Helmkamp, I don't have a medical indication to request transfer. She has in the past not requested that I call. The patient is also stable enough for a lower level of care at this time, so transfer to another hospital is not indicated.   Acute hypoxic respiratory failure, ARDS due to influenza A (+12/18) and superimposed Pneumococcal pneumonia: - s/p tamiflu 12/21-12/25 and ceftriaxone 12/20 - 12/26. Then had zosyn x7 days starting 12/31 per report. Tolerated this well.  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 Appreciate PCCM involvement for management. Site appears stable, oxygenation stable. - Having frequent suctioning of white secretions, if persistent, consider scopolamine vs. robinul.  PEA arrest:  - Continue cardiac monitoring, electrolyte monitoring.   Anoxic brain injury Myoclonic Seizure activity  Neurology discussed with family on 12/31. 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  Signed off 12/31. - Continue keppra, depakene   Recurrent Fevers  Leukocytosis: Intermittent fevers throughout hospital stay. Again febrile 2/4. No change to exam of wound or lungs, no change to oxygenation. Last true fever 1/25 while on cefepime (1/22 - 1/28). Respiratory cultures 1/1, 1/9, 1/13, 1/21 with normal flora. Blood cultures 12/26, 1/13, 1/21, 1/29, 2/4 no growth. Had coag negative staph at presentation, suspect that was contaminant. - Continues low grade temperature elevations intermittently (no significant fevers lately) and persistent leukocytosis though this improved without antibiotic management. Sputum culture checked 2/17 growing few  pseudomonas. We will initiate cefepime coverage and monitor closely.   Pseudomonas HCAP:  - Susceptibility testing completed, resistant to imipenem. To minimize risk of neurotoxicity, and given his previous tolerance, change cefepime to zosyn, confirmed to be sensitive. Note WBC normalized 2/19, vital signs stable   Constipation: Abd benign - Continue scheduled senokot. Having regular BMs now.  Elevated LFT's: Mild, stable. No cholestatic elevations. - Monitor in intermittently.    Paroxysmal sympathetic hyperactivity: Causing sinus tachycardia, hypotension.  - Continue propranolol at 73m q8h dose and neurontin.      Severe protein calorie malnutrition:  - Continue tube feeds thru PEG placed 11/01/2022. At goal rate and tolerating.    Prediabetes: HbA1c 5.9% on 12/20.  - SSI q4h   Hypernatremia: Mild, resolved with increased free water.   - Continue free water [id number].    Pressure Ulcers Pressure Injury 10/12/22 Sacrum Mid;Upper Unstageable - Full thickness tissue loss in which the base of the injury is covered by slough (yellow, tan, gray, green or brown) and/or eschar (tan, brown or black) in the wound bed. (Active)  10/12/22 0200  Location: Sacrum  Location Orientation: Mid;Upper  Staging: Unstageable - Full thickness tissue loss in which the base of the injury is covered by slough (yellow, tan, gray, green or brown) and/or eschar (tan, brown or black) in the wound bed.  Wound Description (Comments):   Present on Admission: Yes     Pressure Injury 10/23/22 Ear Right;Upper Stage 2 -  Partial thickness loss of dermis presenting as a shallow open injury with a red, pink wound bed without slough. (Active)  10/23/22 2009  Location: Ear  Location Orientation: Right;Upper  Staging: Stage 2 -  Partial thickness loss of dermis presenting as a shallow open injury with a red, pink wound bed without slough.  Wound Description (Comments):   Present on Admission: No  - Continue local  wound care. Turn q2h for offloading. Optimize nutritional status.   Patrecia Pour, MD [facility] [email or web address] 12/03/2022, 1:24 PM

## 2022-12-04 DIAGNOSIS — G931 Anoxic brain damage, not elsewhere classified: Secondary | ICD-10-CM | POA: Diagnosis not present

## 2022-12-04 DIAGNOSIS — J9601 Acute respiratory failure with hypoxia: Secondary | ICD-10-CM | POA: Diagnosis not present

## 2022-12-04 DIAGNOSIS — Z7189 Other specified counseling: Secondary | ICD-10-CM | POA: Diagnosis not present

## 2022-12-04 LAB — GLUCOSE, CAPILLARY
Glucose-Capillary: 103 mg/dL — ABNORMAL HIGH (ref 70–99)
Glucose-Capillary: 117 mg/dL — ABNORMAL HIGH (ref 70–99)
Glucose-Capillary: 124 mg/dL — ABNORMAL HIGH (ref 70–99)
Glucose-Capillary: 136 mg/dL — ABNORMAL HIGH (ref 70–99)
Glucose-Capillary: 146 mg/dL — ABNORMAL HIGH (ref 70–99)
Glucose-Capillary: 90 mg/dL (ref 70–99)

## 2022-12-04 NOTE — TOC Progression Note (Addendum)
Transition of Care [facility]) - Progression Note    Patient Details  Name: Damon [name] MRN: [id number] Date of Birth: [birth year]  Transition of Care [facility]) CM/SW [place], [place] Phone Number: 12/04/2022, 10:19 AM  Clinical Narrative:     CSW received message back from Santiago Glad with [facility] who informed CSW that Clinical said it was noted that they team will review plans to address increased secretions next Monday.  Santiago Glad hopeful to get more info next week to see how that goes. CSW awaiting to hear back from [facility].   CSW spoke with Hilda Blades with [facility] who informed CSW may hear back tomorrow. CSW will follow up on referral tomorrow.  Expected Discharge Plan:  (TBD)    Expected Discharge Plan and Services In-house Referral: Clinical Social Work Discharge Planning Services: CM Consult Post Acute Care Choice: NA Living arrangements for the past 2 months: [place]                   DME Agency: NA                   Social Determinants of Health (SDOH) Interventions [place]: No Food Insecurity (10/04/2022)  Housing: Low Risk  (10/04/2022)  Transportation Needs: No Transportation Needs (10/04/2022)  Utilities: Not At Risk (10/04/2022)  Tobacco Use: High Risk (11/02/2022)    Readmission Risk Interventions     No data to display

## 2022-12-04 NOTE — Progress Notes (Addendum)
[facility] PROGRESS NOTE  Damon [name] (DOB: [birth year]) XO:[id number] PCP: [facility]  Brief Narrative: Damon [name] is a 62 y.o. male with a history of HTN, HLD, tobacco use, chronic back pain who presented to the [facility] on 10/03/2022 with influenza A. He's had a protracted hospitalization summarized below.  Significant Events 12/18 Flu A positive 12/20 Admitted, bipap, 5 minute code MRSA PCR - neg Urine strep _POS 12/21 Intubated, paralyzed, ARDS protocol, on 7cc/kg 12/22 Weaned to 6 cc/kg with dyssynchrony, back on 7 cc/kg, driving pressures good, weaning vent 12/23 Attempt to wean sedation again with dyssynchrony and desaturation.  Some concern for cuff leak, tube exchange.  Cuff did not appear to be blown.  Ongoing signs of cuff leak, query leaking around cough, possible large trachea, diuresing 12/25 tolerating PSV, myoclonus> ceribell, Neuro consult, changed to propofol, diuresed  12/26 Changed to DNR. On Propofol, versed.  Tolerating PSV.  MRI brain with hypoxic/anoxic injury Blood culture neg 12/27 GPD's on EEG, pending transfer to [facility] for cEEG NEURO CONSULT 12/28 moved to [facility] for LTM EEG 12/31 family updated by neuro: no chance for meaningful recovery, they should make plans for comfort measures 1/1 cultures - resp normal flora 1/2 eeg still with epileptogenicity. Palli fam mtg -- family does not want to hear from medical teams unless there is "good news"  1/3  cEEG with burst suppression w highly epileptiform bursts.  1/4 cEEG with burst suppression  + highly epileptiform bursts still.  WBC slightly up to 22.5. Temp high 99 low 100  c-EEG stopped duie to lack of benefit Pallative care consult - wife upset about early dc from ER on 12/18 Chaplain consult: Wife is sufering from anticipatory grief + accepting god's way + curious/anger about events leading upto current state EEG: generalized epileptogenicity with high potential for seizures as well as  profound diffuse encephalopathy. In the setting of cardiac arrest, this EEG pattern is suggestive of anoxic/hypoxic brain injury.  1/5 - Low grade fever +. [place] 22.5K.  on Zosyn. On vent fio2 30%. TF +. Per RN - diprivan stopped yesterday. No myoclonus today. Mild tachycardia + Rpt pall care on 10/21/22 per wife 1/6 - On vent FiO2 30%.  Tmax 99,71F ., WBC better. Still off diprivan -> No myoclonus. cXR visualized - devices in place and faiure clear Oral oxy and klonpin stopped 1/8 No significant changes in neuro status. Tachycardic to 130s, hypertensive to 140s. Coreg transitioned to metoprolol. 1/11 wife considering trach/peg, to discuss with surgery-discussed with Dr. Bobbye Morton 1/12 more tachycardic 1/16-spoke with wife at length at bedside, Dr. Hulen Skains also had a long conversation with spouse-desires to continue current aggressive care and desires PEG and trach placed 1/18 perc trach and peg 11/05/2022 transferred to floor due to need for ICU bed 11/08/2022 transition to [facility] with pulmonary following weekly for trach 2/4 recurrent fever, cultures collected. 2/5 changed to cuffless trach 2/18 antibiotics started for Pseudomonas on tracheal aspirate cultre  Subjective: No overnight events reported. Secretions seem to be decreased.  Objective: BP (!) 121/90 (BP Location: Left Arm)   Pulse (!) 104   Temp 97.9 F (36.6 C) (Axillary)   Resp (!) 22   Ht 5' 4"$  (1.626 m)   Wt 58.1 kg   SpO2 100%   BMI 21.97 kg/m   Gen: Frail unresponsive male in no distress Pulm: Clear at bases  CV: Regular, rate ~100-110, no edema  GI: Soft, nondistended, PEG site c/d/i Neuro: Unresponsive, flaccid extremities. Spontaneously  opens eyes at times, does not track. Ext: Warm, well perfused Skin: No new rashes, lesions or ulcers on visualized skin   Assessment & Plan: Goals of Care: IPAL from 1/24 reviewed. He had been DNR, but transitioned back to full code. - Remains full code per extensive  discussions with patient's spouse.  - We will continue all aggressive medical work up/interventions as indicated. We again discussed the rationale that supports transferring to SNF level of care but still no need to laterally transfer to another hospital at this time. She reluctantly accepts this. She has in the past not requested that I call and did not make that request during discussion 2/19.     Acute hypoxic respiratory failure, ARDS due to influenza A (+12/18) and superimposed Pneumococcal pneumonia: - s/p tamiflu 12/21-12/25 and ceftriaxone 12/20 - 12/26. Then had zosyn x7 days starting 12/31 per report. Tolerated this well.  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 Appreciate PCCM involvement for management. Site appears stable, oxygenation stable. - Having frequent suctioning of white secretions though improving, if persistent, consider scopolamine vs. robinul.  PEA arrest:  - Continue cardiac monitoring, electrolyte monitoring.   Anoxic brain injury Myoclonic Seizure activity  Neurology discussed with family on 12/31. 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  Signed off 12/31. - Continue keppra, depakene   Recurrent Fevers  Leukocytosis: Intermittent fevers throughout hospital stay. Again febrile 2/4. No change to exam of wound or lungs, no change to oxygenation. Last true fever 1/25 while on cefepime (1/22 - 1/28). Respiratory cultures 1/1, 1/9, 1/13, 1/21 with normal flora. Blood cultures 12/26, 1/13, 1/21, 1/29, 2/4 no growth. Had coag negative staph at presentation, suspect that was contaminant. - Continues low grade temperature elevations intermittently (no significant fevers lately) and persistent leukocytosis though this improved without antibiotic management. Sputum culture checked 2/17 growing few pseudomonas. We will initiate cefepime coverage and monitor  closely.   Pseudomonas pneumonia vs. tracheitis:  - Susceptibility testing completed, resistant to imipenem. Continue zosyn, confirmed to be sensitive. Note WBC normalized 2/19, vital signs stable.   Constipation: Abd benign - Continue scheduled senokot. Having regular BMs now.  Elevated LFT's: Mild, stable. No cholestatic elevations. - Monitor in intermittently.    Paroxysmal sympathetic hyperactivity: Causing sinus tachycardia, hypotension.  - Continue propranolol at 53m q8h dose and neurontin.      Severe protein calorie malnutrition:  - Continue tube feeds thru PEG placed 11/01/2022. At goal rate and tolerating.    Prediabetes: HbA1c 5.9% on 12/20.  - SSI q4h   Hypernatremia: Mild, resolved with increased free water.   - Continue free water [id number].    Pressure Ulcers Pressure Injury 10/12/22 Sacrum Mid;Upper Unstageable - Full thickness tissue loss in which the base of the injury is covered by slough (yellow, tan, gray, green or brown) and/or eschar (tan, brown or black) in the wound bed. (Active)  10/12/22 0200  Location: Sacrum  Location Orientation: Mid;Upper  Staging: Unstageable - Full thickness tissue loss in which the base of the injury is covered by slough (yellow, tan, gray, green or brown) and/or eschar (tan, brown or black) in the wound bed.  Wound Description (Comments):   Present on Admission: Yes     Pressure Injury 10/23/22 Ear Right;Upper Stage 2 -  Partial thickness loss of dermis presenting as a shallow open injury with a red, pink wound bed without slough. (Active)  10/23/22 2009  Location:  Ear  Location Orientation: Right;Upper  Staging: Stage 2 -  Partial thickness loss of dermis presenting as a shallow open injury with a red, pink wound bed without slough.  Wound Description (Comments):   Present on Admission: No  - Continue local wound care. Turn q2h for offloading. Optimize nutritional status.   Patrecia Pour, MD [facility] [email or web address] 12/04/2022, 10:06 AM

## 2022-12-04 NOTE — Progress Notes (Signed)
Daily Progress Note   Patient Name: Damon [name]       Date: 12/04/2022 DOB: Nov 06, 1960  Age: 62 y.o. MRN#: [id number] Attending Physician: Patrecia Pour, MD Primary Care Physician: [facility] Admit Date: 10/03/2022  Reason for Consultation/Follow-up: Establishing goals of care  Patient Profile/HPI:  62 y/o male admitted to [facility] on 12/20 in setting of acute hypoxemic respiratory failure due to Flu A. Placed on BIPAP. Had a 5 minute PEA arrest, required intubation. After several days on mechanical ventilation remains comatose, MRI brain findings worrisome for anoxic brain injury. Moved to [facility] for LTM EEG monitoring.  Palliative care has been asked to get involved for further goals of care conversations in the setting of anoxic brain injury post arrest. 12/31- no improvements, prognosis has been discussed extensively with family by palliative team, neurology and PCCM providers. 1/2- f/u meeting with patient's spouse Damon [name]- she requested time and space as she grapples with processing eventual loss of spouse and how her life will be without him, she is not yet in a place to consider transition to comfort 1/7- propofol dc'd 1/5, no further myoclonus, no other sedating meds, discussed with spouse that decisions are needing to be made re: trach/PEG/LTC vs comfort 1/8 No significant changes in neuro status. Tachycardic to 130s, hypertensive to 140s. Coreg transitioned to metoprolol.   1/11-having some recurrent myoclonus ?neurostorming 1/15-wife requesting trach/PEG 1/23- trach/PEG in place, tolerating trach collar 2/20- no neurologic changes, TOC working on placement  Subjective: Chart reviewed including labs, progress notes, imaging from this and previous encounters.  Evaluated patient.  He opens eyes to voice. Does not track, does not follow commands. No movements of extremities.  Spouse Damon [name] and daughter [name] at bedside. No questions or concerns. GOC continue to be full scope full code with placement in facility.   Review of Systems  Unable to perform ROS: Mental acuity   Physical Exam Vitals and nursing note reviewed.  Constitutional:      Comments: Frail, cachectic  Cardiovascular:     Rate and Rhythm: Normal rate.  Pulmonary:     Comments: secretions Neurological:     Comments: Responds to pain, opens eyes to voice, nonverbal, no tracking, does not follow commands             Vital Signs: BP (!) 121/90 (BP Location: Left Arm)   Pulse (!) 120   Temp 97.9 F (36.6 C) (Axillary)   Resp (!) 27   Ht 5' 4"$  (1.626 m)   Wt 58.1 kg   SpO2 99%   BMI 21.97 kg/m  SpO2: SpO2: 99 % O2 Device: O2 Device: Tracheostomy Collar O2 Flow Rate: O2 Flow Rate (L/min): 6 L/min  Intake/output summary:  Intake/Output Summary (Last 24 hours) at 12/04/2022 1255 Last data filed at 12/04/2022 0351 Gross per 24 hour  Intake 1482.17 ml  Output 1100 ml  Net 382.17 ml    LBM: Last BM Date : 12/04/22 Baseline Weight: Weight: 64.9 kg Most recent weight: Weight: 58.1 kg       Palliative Assessment/Data: PPS: 10%      Patient Active Problem List   Diagnosis Date Noted   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   Advanced  care planning/counseling discussion 10/21/2022   On mechanically assisted ventilation ([place]) 10/17/2022   Anoxic brain damage ([place]) 10/17/2022   Anoxic encephalopathy ([place]) 10/16/2022   Seizure ([place]) 10/16/2022   Pressure injury of skin 10/16/2022   Acute hypoxic respiratory failure ([place]) 10/03/2022   CAP (community acquired pneumonia) 10/03/2022   Influenza and pneumonia 10/03/2022   Elevated LFTs 10/03/2022   HTN (hypertension) 10/03/2022   HLD (hyperlipidemia) 10/03/2022   Tobacco abuse 10/03/2022   SIRS (systemic inflammatory response  syndrome) ([place]) 10/03/2022   Influenza A 10/03/2022   Cardiac arrest ([place]) 10/03/2022   Tobacco dependence 10/03/2022    Palliative Care Assessment & Plan    Assessment/Recommendations/Plan  Full scope/Full code TOC working on discharge plans to SNF Palliative will sign off- I discussed with Damon [name] that she is welcome to call with further needs, please feel free to consult again if needed  Code Status: Full code  Prognosis:  Unable to determine  Discharge Planning: To Be Determined  Thank you for allowing the Palliative Medicine Team to assist in the care of this patient.  Total time: 50 minutes Greater than 50%  of this time was spent counseling and coordinating care related to the above assessment and plan.  [name], AGNP-C Palliative Medicine   Please contact Palliative Medicine Team phone at [phone] for questions and concerns.

## 2022-12-05 DIAGNOSIS — J9601 Acute respiratory failure with hypoxia: Secondary | ICD-10-CM | POA: Diagnosis not present

## 2022-12-05 LAB — GLUCOSE, CAPILLARY
Glucose-Capillary: 102 mg/dL — ABNORMAL HIGH (ref 70–99)
Glucose-Capillary: 104 mg/dL — ABNORMAL HIGH (ref 70–99)
Glucose-Capillary: 110 mg/dL — ABNORMAL HIGH (ref 70–99)
Glucose-Capillary: 129 mg/dL — ABNORMAL HIGH (ref 70–99)
Glucose-Capillary: 84 mg/dL (ref 70–99)
Glucose-Capillary: 99 mg/dL (ref 70–99)

## 2022-12-05 NOTE — Progress Notes (Signed)
[facility] PROGRESS NOTE  Damon [name] (DOB: 11/16/1960) [id number]:[id number] PCP: [facility]  Brief Narrative: Damon [name] is Damon [name] 62 y.o. male with Damon [name] history of HTN, HLD, tobacco use, chronic back pain who presented to the [facility] on 10/03/2022 with influenza Damon [name]. He's had Damon [name] protracted hospitalization summarized below.  Significant Events 12/18 Flu Damon [name] positive 12/20 Admitted, bipap, 5 minute code MRSA PCR - neg Urine strep _POS 12/21 Intubated, paralyzed, ARDS protocol, on 7cc/kg 12/22 Weaned to 6 cc/kg with dyssynchrony, back on 7 cc/kg, driving pressures good, weaning vent 12/23 Attempt to wean sedation again with dyssynchrony and desaturation.  Some concern for cuff leak, tube exchange.  Cuff did not appear to be blown.  Ongoing signs of cuff leak, query leaking around cough, possible large trachea, diuresing 12/25 tolerating PSV, myoclonus> ceribell, Neuro consult, changed to propofol, diuresed  12/26 Changed to DNR. On Propofol, versed.  Tolerating PSV.  MRI brain with hypoxic/anoxic injury Blood culture neg 12/27 GPD's on EEG, pending transfer to [facility] for cEEG NEURO CONSULT 12/28 moved to [facility] for LTM EEG 12/31 family updated by neuro: no chance for meaningful recovery, they should make plans for comfort measures 1/1 cultures - resp normal flora 1/2 eeg still with epileptogenicity. Palli fam mtg -- family does not want to hear from medical teams unless there is "good news"  1/3  cEEG with burst suppression w highly epileptiform bursts.  1/4 cEEG with burst suppression  + highly epileptiform bursts still.  WBC slightly up to 22.5. Temp high 99 low 100  c-EEG stopped duie to lack of benefit Pallative care consult - wife upset about early dc from ER on 12/18 Chaplain consult: Wife is sufering from anticipatory grief + accepting god's way + curious/anger about events leading upto current state EEG: generalized epileptogenicity with high potential for seizures as well as  profound diffuse encephalopathy. In the setting of cardiac arrest, this EEG pattern is suggestive of anoxic/hypoxic brain injury.  1/5 - Low grade fever +. [place] 22.5K.  on Zosyn. On vent fio2 30%. TF +. Per RN - diprivan stopped yesterday. No myoclonus today. Mild tachycardia + Rpt pall care on 10/21/22 per wife 1/6 - On vent FiO2 30%.  Tmax 99,68F ., WBC better. Still off diprivan -> No myoclonus. cXR visualized - devices in place and faiure clear Oral oxy and klonpin stopped 1/8 No significant changes in neuro status. Tachycardic to 130s, hypertensive to 140s. Coreg transitioned to metoprolol. 1/11 wife considering trach/peg, to discuss with surgery-discussed with Damon [name] 1/12 more tachycardic 1/16-spoke with wife at length at bedside, Dr. Hulen Skains also had Antoney [name] long conversation with spouse-desires to continue current aggressive care and desires PEG and trach placed 1/18 perc trach and peg 11/05/2022 transferred to floor due to need for ICU bed 11/08/2022 transition to [facility] with pulmonary following weekly for trach 2/4 recurrent fever, cultures collected. 2/5 changed to cuffless trach 2/18 antibiotics started for Pseudomonas on tracheal aspirate cultre  Subjective: No overnight events reported. Secretions seem to be decreased.  Objective: BP 92/67 (BP Location: Left Arm)   Pulse (!) 120   Temp 98.9 F (37.2 C) (Axillary)   Resp 18   Ht 5' 4"$  (1.626 m)   Wt 57.6 kg   SpO2 97%   BMI 21.80 kg/m   General: comatose Cardiovascular: sinus tach Lungs: unlabored  Abdomen: Soft, nontender, nondistended Neurological: comatose, no meaningful movement, some grimacing noted with cough  Skin: Warm and dry. No rashes or lesions. Extremities:  No edema.  Assessment & Plan:  Goals of Care: IPAL from 1/24 reviewed. He had been DNR, but transitioned back to full code. - Remains full code per extensive discussions with patient's spouse.  - We will continue all aggressive medical  work up/interventions as indicated. We again discussed the rationale that supports transferring to SNF level of care but still no need to laterally transfer to another hospital at this time. She reluctantly accepts this. She has in the past not requested that I call and did not make that request during discussion 2/19.    Shivering  Goosebumps - this seems new from previous times I've cared for him, he's afebrile today, but notably has recently been started on abx for tracheitis vs pneumonia - work on environmental causes (cold, blanket prn, etc) - continue abx as below, watch for fevers   Acute hypoxic respiratory failure, ARDS due to influenza Santonio Speakman (+12/18) and superimposed Pneumococcal pneumonia: - s/p tamiflu 12/21-12/25 and ceftriaxone 12/20 - 12/26. Then had zosyn x7 days starting 12/31 per report. Tolerated this well.  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 Appreciate PCCM involvement for management. Site appears stable, oxygenation stable. - Having frequent suctioning of white secretions though improving, if persistent, consider scopolamine vs. robinul.  PEA arrest:  - Continue cardiac monitoring, electrolyte monitoring.   Anoxic brain injury Myoclonic Seizure activity  Neurology discussed with family on 12/31. 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  Signed off 12/31. - Continue keppra, depakene   Recurrent Fevers  Leukocytosis: Intermittent fevers throughout hospital stay. Again febrile 2/4. No change to exam of wound or lungs, no change to oxygenation. Last true fever 1/25 while on cefepime (1/22 - 1/28). Respiratory cultures 1/1, 1/9, 1/13, 1/21 with normal flora. Blood cultures 12/26, 1/13, 1/21, 1/29, 2/4 no growth. Had coag negative staph at presentation, suspect that was contaminant. - Sputum culture checked 2/17 growing few pseudomonas. Continue  zosyn.  Pseudomonas pneumonia vs. tracheitis:  - Susceptibility testing completed, resistant to imipenem. Continue zosyn, confirmed to be sensitive. Note WBC normalized 2/19, vital signs stable.   Constipation: Abd benign - Continue scheduled senokot. Having regular BMs now.  Elevated LFT's: Mild, stable. No cholestatic elevations. - Monitor in intermittently.    Paroxysmal sympathetic hyperactivity: Causing sinus tachycardia, hypotension.  - Continue propranolol at 45m q8h dose and neurontin.      Severe protein calorie malnutrition:  - Continue tube feeds thru PEG placed 11/01/2022. At goal rate and tolerating.    Prediabetes: HbA1c 5.9% on 12/20.  - SSI q4h   Hypernatremia: Mild, resolved with increased free water.   - Continue free water [id number].    Pressure Ulcers Pressure Injury 10/12/22 Sacrum Mid;Upper Stage 2 -  Partial thickness loss of dermis presenting as Ellianne Gowen shallow open injury with Bao Bazen red, pink wound bed without slough. (Active)  10/12/22 0200  Location: Sacrum  Location Orientation: Mid;Upper  Staging: Stage 2 -  Partial thickness loss of dermis presenting as Montavis Schubring shallow open injury with Temitayo Covalt red, pink wound bed without slough.  Wound Description (Comments):   Present on Admission: Yes   CaFayrene HelperMD [facility] [email or web address] 12/05/2022, 2:11 PM

## 2022-12-05 NOTE — TOC Progression Note (Addendum)
Transition of Care [facility]) - Progression Note    Patient Details  Name: Damon [name] MRN: [id number] Date of Birth: May 03, 1961  Transition of Care [facility] CM/SW [place], [place] Phone Number: 12/05/2022, 12:26 PM  Clinical Narrative:      Update- CSW received message from [place] with [facility] who informed CSW they are unable to make a trach/SNF bed offer. CSW plans to follow up with [facility] next week.Patient is no current trach/SNF bed offers at this time. CSW will continue to follow.   DON with [facility] requesting wound note for review. CSW faxed over requested clinical. CSW awaiting determination to see if they can offer trach/SNF bed for patient. CSW will continue to follow and assist with patients dc planning needs.  Expected Discharge Plan:  (TBD)    Expected Discharge Plan and Services In-house Referral: Clinical Social Work Discharge Planning Services: CM Consult Post Acute Care Choice: NA Living arrangements for the past 2 months: [place]                   DME Agency: NA                   Social Determinants of Health (SDOH) Interventions [place]: No Food Insecurity (10/04/2022)  Housing: Low Risk  (10/04/2022)  Transportation Needs: No Transportation Needs (10/04/2022)  Utilities: Not At Risk (10/04/2022)  Tobacco Use: High Risk (11/02/2022)    Readmission Risk Interventions     No data to display

## 2022-12-05 NOTE — Plan of Care (Signed)

## 2022-12-05 NOTE — Progress Notes (Signed)
Nutrition Follow-up  DOCUMENTATION CODES:   Not applicable  INTERVENTION:  Continue tube feeding via G-tube: -Osmolite 1.5 at 65 mL/hour (1560 mL goal daily volume) -PROSource TF20 60 mL BID per tube -Provides: 2500 kcal, 137 grams of protein, 1185 mL H2O daily   With current free water flush of 200 mL every 4 hours, pt receives a total of 2385 mL H2O daily including water in tube feeds.   Continue Juven 1 packet per tube BID. Each packet provides 95 kcal, 7 grams L-Arginine, 7 grams L-Glutamine, 2.5 grams collagen protein, 300 mg vitamin C, 9.5 mg zinc, and other micronutrients essential for wound healing.  NUTRITION DIAGNOSIS:   Inadequate oral intake related to inability to eat as evidenced by NPO status.  Ongoing - addressing with TF regimen.  GOAL:   Patient will meet greater than or equal to 90% of their needs  Met with TF regimen.  MONITOR:   Labs, Weight trends, TF tolerance, Skin, I & O's  REASON FOR ASSESSMENT:   Consult Enteral/tube feeding initiation and management  ASSESSMENT:   Pt admitted from home with the flu leading to acute respiratory failure with hypoxia and PEA arrest upon admission. PMH significant for HTN, HLD, chronic back pain, tobacco use.  12/18: Flu A positive 12/20: admitted, 5 minute code after non-compliance with BiPAP 12/21: intubated, paralyzed, ARDS protocol 12/26: MRI brain with hypoxic/anoxic injury  12/28: transfer from [facility] to [facility] 4N 1/18: s/p tracheostomy tube placement and G-tube placement 1/21: tube feeds changed from Osmolite 1.5 to Vital 1.5 due to shortage of Osmolite 1.5 1/22: transfer from 4N to [place]; rectal tube removed; free water flush reduced to 200 mL every 8 hours 1/24: water flushes changed to 150 mL every 4 hours   2/5: water flush increased to 200 mL every 4 hours per MD; changed to cuffless trach 2/7: water flush increased to 250 mL every 4 hours per MD and then later increased to 300 mL every 3 hours per MD 2/10:  water flush changed to 250 mL every 4 hours per MD  2/14: water flush changed to 200 mL every 4 hours per MD   No family members present at time of RD assessment. Pt on trach collar 6 L/min 21% FiO2. Pt is not verbally responsive. Tube feeds infusing at goal rate. Discussed with RN. Pt continues to tolerate tube feed regimen well. Per skin documentation wound ear is no longer being documented. Wound to sacrum documented as early/partial granulation state of healing.  Admission wt was 64.9 kg. Weights trended down this admission. Pt was 52.1 kg on 10/24/22 and 51.1 kg on 11/02/22. Calories were increased on 10/30/22. Most recent wt 57.6 kg on 12/05/22. Will continue to monitor trends.  Enteral Access: 24 Fr. G-tube placed 1/18  Tube feeds: Osmolite 1.5 at 65 mL/hour + PROSource TF20 60 mL BID + free water 200 mL every 4 hours  Medications reviewed and include: famotidine, gabapentin, Medihoney, Keppra, Juven BID, propranolol, sennosides, valproic acid, Zoysn  Labs reviewed: CBG 99-110 On 2/19 BUN 38  UOP: 1000 mL (0.7 mL/kg/hr)  I/O: +5094.3 mL since 11/21/22  Diet Order:   Diet Order     None      EDUCATION NEEDS:   No education needs have been identified at this time  Skin:  Skin Assessment: Skin Integrity Issues: Skin Integrity Issues:: Unstageable Stage II: N/A Unstageable: sacrum (8cm x 1.5 cm x 0 cm) early/partial granulation  Last BM:  12/05/22 - medium type 6  Height:   Ht Readings from Last 1 Encounters:  10/11/22 5' 4"$  (1.626 m)   Weight:   Wt Readings from Last 1 Encounters:  12/05/22 57.6 kg   BMI:  Body mass index is 21.8 kg/m.  Estimated Nutritional Needs:   Kcal:  2400-2600  Protein:  120-140 grams  Fluid:  >/= 2 L/day  Loanne Drilling, MS, RD, LDN, CNSC Pager number available on Amion

## 2022-12-06 ENCOUNTER — Inpatient Hospital Stay (HOSPITAL_COMMUNITY): Payer: Medicare Other

## 2022-12-06 ENCOUNTER — Encounter (HOSPITAL_COMMUNITY): Payer: Medicare Other

## 2022-12-06 DIAGNOSIS — J9601 Acute respiratory failure with hypoxia: Secondary | ICD-10-CM | POA: Diagnosis not present

## 2022-12-06 DIAGNOSIS — R569 Unspecified convulsions: Secondary | ICD-10-CM | POA: Diagnosis not present

## 2022-12-06 LAB — GLUCOSE, CAPILLARY
Glucose-Capillary: 107 mg/dL — ABNORMAL HIGH (ref 70–99)
Glucose-Capillary: 118 mg/dL — ABNORMAL HIGH (ref 70–99)
Glucose-Capillary: 122 mg/dL — ABNORMAL HIGH (ref 70–99)
Glucose-Capillary: 123 mg/dL — ABNORMAL HIGH (ref 70–99)
Glucose-Capillary: 125 mg/dL — ABNORMAL HIGH (ref 70–99)
Glucose-Capillary: 128 mg/dL — ABNORMAL HIGH (ref 70–99)
Glucose-Capillary: 146 mg/dL — ABNORMAL HIGH (ref 70–99)

## 2022-12-06 LAB — CBC WITH DIFFERENTIAL/PLATELET
Abs Immature Granulocytes: 0.5 10*3/uL — ABNORMAL HIGH (ref 0.00–0.07)
Basophils Absolute: 0.1 10*3/uL (ref 0.0–0.1)
Basophils Relative: 1 %
Blasts: 1 %
Eosinophils Absolute: 0.2 10*3/uL (ref 0.0–0.5)
Eosinophils Relative: 2 %
HCT: 35.7 % — ABNORMAL LOW (ref 39.0–52.0)
Hemoglobin: 11.5 g/dL — ABNORMAL LOW (ref 13.0–17.0)
Lymphocytes Relative: 15 %
Lymphs Abs: 1.6 10*3/uL (ref 0.7–4.0)
MCH: 28.8 pg (ref 26.0–34.0)
MCHC: 32.2 g/dL (ref 30.0–36.0)
MCV: 89.3 fL (ref 80.0–100.0)
Monocytes Absolute: 1.4 10*3/uL — ABNORMAL HIGH (ref 0.1–1.0)
Monocytes Relative: 13 %
Myelocytes: 4 %
Neutro Abs: 6.9 10*3/uL (ref 1.7–7.7)
Neutrophils Relative %: 63 %
Platelets: 270 10*3/uL (ref 150–400)
Promyelocytes Relative: 1 %
RBC: 4 MIL/uL — ABNORMAL LOW (ref 4.22–5.81)
RDW: 15.7 % — ABNORMAL HIGH (ref 11.5–15.5)
WBC: 10.9 10*3/uL — ABNORMAL HIGH (ref 4.0–10.5)
nRBC: 0 /100 WBC
nRBC: 0.2 % (ref 0.0–0.2)

## 2022-12-06 LAB — COMPREHENSIVE METABOLIC PANEL
ALT: 36 U/L (ref 0–44)
AST: 22 U/L (ref 15–41)
Albumin: 2.4 g/dL — ABNORMAL LOW (ref 3.5–5.0)
Alkaline Phosphatase: 85 U/L (ref 38–126)
Anion gap: 10 (ref 5–15)
BUN: 30 mg/dL — ABNORMAL HIGH (ref 6–20)
CO2: 24 mmol/L (ref 22–32)
Calcium: 9.1 mg/dL (ref 8.9–10.3)
Chloride: 101 mmol/L (ref 98–111)
Creatinine, Ser: 0.55 mg/dL — ABNORMAL LOW (ref 0.61–1.24)
GFR, Estimated: >60 mL/min (ref >60)
Glucose, Bld: 118 mg/dL — ABNORMAL HIGH (ref 70–99)
Potassium: 4.1 mmol/L (ref 3.5–5.1)
Sodium: 135 mmol/L (ref 135–145)
Total Bilirubin: 0.5 mg/dL (ref 0.3–1.2)
Total Protein: 7.1 g/dL (ref 6.5–8.1)

## 2022-12-06 LAB — MAGNESIUM: Magnesium: 2.1 mg/dL (ref 1.7–2.4)

## 2022-12-06 LAB — VALPROIC ACID LEVEL: Valproic Acid Lvl: 45 ug/mL — ABNORMAL LOW (ref 50.0–100.0)

## 2022-12-06 LAB — PHOSPHORUS: Phosphorus: 4.1 mg/dL (ref 2.5–4.6)

## 2022-12-06 MED ORDER — CLONAZEPAM 0.25 MG PO TBDP
1.0000 mg | ORAL_TABLET | Freq: Two times a day (BID) | ORAL | Status: DC
  Administered 2022-12-06 – 2022-12-11 (×10): 1 mg via ORAL
  Filled 2022-12-06 (×10): qty 4

## 2022-12-06 NOTE — Procedures (Addendum)
Patient Name: Damon [name]  MRN: [id number]  Epilepsy Attending: Lora Havens  Referring Physician/Provider: Elodia Florence., MD  Date: 12/06/2022  Duration: 32.12 mins  Patient history: 62yo M s/p cardiac arrest. EEG to evaluate for seizure   Level of alertness: Awake  AEDs during EEG study: LEV, VPA  Technical aspects: This EEG study was done with scalp electrodes positioned according to the 10-20 International system of electrode placement. Electrical activity was reviewed with band pass filter of 1-70Hz$ , sensitivity of 7 uV/mm, display speed of 27m/sec with a 60Hz$  notched filter applied as appropriate. EEG data were recorded continuously and digitally stored.  Video monitoring was available and reviewed as appropriate.  Description: EEG showed continuous generalized low amplitude 2-3Hz$  delta slowing. Generalized spikes were noted, qasi-periodic at 1-2hz$ .   Patient was noted to have repetitive episodes of right leg elevation.Concomitant EEG showed generalized polyspikes but not all episodes were time locked with generalized polyspikes making it difficult to certainly say if they are myoclonic seizure or not.   Hyperventilation and photic stimulation were not performed.     ABNORMALITY - Spike,generalized - Continuous slow, generalized  IMPRESSION: This study  showed evidence of generalized epileptogenicity as well as profound diffuse encephalopathy.This EEG pattern is consistent with patient's history of anoxic/hypoxic brain injury.   Patient was noted to have repetitive episodes of right leg elevation. Some episodes showed concomitant eeg change suggestive of myoclonic seizure but not all making it difficult to certainly say if they are myoclonic seizure or not. Clinical correlation is recommended  Dr PFlorene Glenwas notified.  Adeeb Konecny OBarbra Sarks

## 2022-12-06 NOTE — Progress Notes (Signed)
[facility] PROGRESS NOTE  Brendyn Sloman (DOB: 13-Jun-1961) [id number] PCP: [facility]  Brief Narrative: Shahbaz Plachy is Dashay Giesler 62 y.o. male with Mickey Hebel history of HTN, HLD, tobacco use, chronic back pain who presented to the [facility] on 10/03/2022 with influenza Birdie Fetty. He's had Oaklynn Stierwalt protracted hospitalization summarized below.  Significant Events 12/18 Flu Reita Shindler positive 12/20 Admitted, bipap, 5 minute code MRSA PCR - neg Urine strep _POS 12/21 Intubated, paralyzed, ARDS protocol, on 7cc/kg 12/22 Weaned to 6 cc/kg with dyssynchrony, back on 7 cc/kg, driving pressures good, weaning vent 12/23 Attempt to wean sedation again with dyssynchrony and desaturation.  Some concern for cuff leak, tube exchange.  Cuff did not appear to be blown.  Ongoing signs of cuff leak, query leaking around cough, possible large trachea, diuresing 12/25 tolerating PSV, myoclonus> ceribell, Neuro consult, changed to propofol, diuresed  12/26 Changed to DNR. On Propofol, versed.  Tolerating PSV.  MRI brain with hypoxic/anoxic injury Blood culture neg 12/27 GPD's on EEG, pending transfer to [facility] for cEEG NEURO CONSULT 12/28 moved to [facility] for LTM EEG 12/31 family updated by neuro: no chance for meaningful recovery, they should make plans for comfort measures 1/1 cultures - resp normal flora 1/2 eeg still with epileptogenicity. Palli fam mtg -- family does not want to hear from medical teams unless there is "good news"  1/3  cEEG with burst suppression w highly epileptiform bursts.  1/4 cEEG with burst suppression  + highly epileptiform bursts still.  WBC slightly up to 22.5. Temp high 99 low 100  c-EEG stopped duie to lack of benefit Pallative care consult - wife upset about early dc from ER on 12/18 Chaplain consult: Wife is sufering from anticipatory grief + accepting god's way + curious/anger about events leading upto current state EEG: generalized epileptogenicity with high potential for seizures as well as  profound diffuse encephalopathy. In the setting of cardiac arrest, this EEG pattern is suggestive of anoxic/hypoxic brain injury.  1/5 - Low grade fever +. [place] 22.5K.  on Zosyn. On vent fio2 30%. TF +. Per RN - diprivan stopped yesterday. No myoclonus today. Mild tachycardia + Rpt pall care on 10/21/22 per wife 1/6 - On vent FiO2 30%.  Tmax 99,13F ., WBC better. Still off diprivan -> No myoclonus. cXR visualized - devices in place and faiure clear Oral oxy and klonpin stopped 1/8 No significant changes in neuro status. Tachycardic to 130s, hypertensive to 140s. Coreg transitioned to metoprolol. 1/11 wife considering trach/peg, to discuss with surgery-discussed with Dr. Bobbye Morton 1/12 more tachycardic 1/16-spoke with wife at length at bedside, Dr. Hulen Skains also had Ezra Denne long conversation with spouse-desires to continue current aggressive care and desires PEG and trach placed 1/18 perc trach and peg 11/05/2022 transferred to floor due to need for ICU bed 11/08/2022 transition to [facility] with pulmonary following weekly for trach 2/4 recurrent fever, cultures collected. 2/5 changed to cuffless trach 2/18 antibiotics started for Pseudomonas on tracheal aspirate cultre 2/22 concern for myoclonic seizure   Subjective: No overnight events reported. Secretions seem to be decreased.  Objective: BP 114/82 (BP Location: Left Arm)   Pulse (!) 112   Temp 99 F (37.2 C) (Axillary)   Resp (!) 21   Ht 5' 4"$  (1.626 m)   Wt 57.6 kg   SpO2 98%   BMI 21.80 kg/m   General: comatose Cardiovascular: sinus tach  Lungs: scattered rhonchi, trach  Abdomen: Soft, nontender, nondistended  Neurological: comatose, R leg with rhythmic extension Extremities: No  clubbing or cyanosis. No edema.   Assessment & Plan:  Goals of Care: IPAL from 1/24 reviewed. He had been DNR, but transitioned back to full code. - Remains full code per extensive discussions with patient's spouse.  - We will continue all  aggressive medical work up/interventions as indicated. Previous provider discussed the rationale that supports transferring to SNF level of care but still no need to laterally transfer to another hospital at this time. She apparently reluctantly accepted this.  Discussed concern for seizures today, she'll be here tomorrow for more updates.    Anoxic brain injury Myoclonic Seizure activity  Neurology discussed with family on 12/31. 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  Signed off 12/31. Suspect the shivering maybe related to this? EEG 2/22 consistent with anoxic/hypoxic brain injury.  EEG changes c/w myoclonic seizure.  Neurology to evaluate, appreciate assistance.  - Continue keppra, depakene   Acute hypoxic respiratory failure, ARDS due to influenza Amear Strojny (+12/18) and superimposed Pneumococcal pneumonia: - s/p tamiflu 12/21-12/25 and ceftriaxone 12/20 - 12/26. Then had zosyn x7 days starting 12/31 per report. Tolerated this well.  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 Appreciate PCCM involvement for management. Site appears stable, oxygenation stable. - Having frequent suctioning of white secretions - repeat cxr  PEA arrest:  - Continue cardiac monitoring, electrolyte monitoring.    Recurrent Fevers  Leukocytosis: Intermittent fevers throughout hospital stay. Again febrile 2/4. No change to exam of wound or lungs, no change to oxygenation. Last true fever 1/25 while on cefepime (1/22 - 1/28). Respiratory cultures 1/1, 1/9, 1/13, 1/21 with normal flora. Blood cultures 12/26, 1/13, 1/21, 1/29, 2/4 no growth. Had coag negative staph at presentation, suspect that was contaminant. - Sputum culture checked 2/17 growing few pseudomonas. Continue zosyn (2/19-present).  Plan for 7 day course.  Repeat cxr as above.  Pseudomonas pneumonia vs. tracheitis:  - Susceptibility testing  completed, resistant to imipenem. Continue zosyn, confirmed to be sensitive. Note WBC normalized 2/19, vital signs stable.   Constipation: Abd benign - Continue scheduled senokot. Having regular BMs now.  Elevated LFT's: Mild, stable. No cholestatic elevations. - Monitor in intermittently.    Paroxysmal sympathetic hyperactivity: Causing sinus tachycardia, hypotension.  - Continue propranolol at 53m q8h dose and neurontin.  - worse sinus tach today, related to ? Seizure activity - has IV metop prn     Severe protein calorie malnutrition:  - Continue tube feeds thru PEG placed 11/01/2022. At goal rate and tolerating.    Prediabetes: HbA1c 5.9% on 12/20.  - SSI q4h   Hypernatremia: Mild, resolved with increased free water.   - Continue free water 2050mq4h.    Pressure Ulcers Pressure Injury 10/12/22 Sacrum Mid;Upper Stage 2 -  Partial thickness loss of dermis presenting as Aubreyanna Dorrough shallow open injury with Ameriah Lint red, pink wound bed without slough. (Active)  10/12/22 0200  Location: Sacrum  Location Orientation: Mid;Upper  Staging: Stage 2 -  Partial thickness loss of dermis presenting as Maylie Ashton shallow open injury with Jhoel Stieg red, pink wound bed without slough.  Wound Description (Comments):   Present on Admission: Yes   CaFayrene HelperMD [facility] [email or web address] 12/06/2022, 6:17 PM

## 2022-12-06 NOTE — Progress Notes (Signed)
LTM EEG hooked up and running - no initial skin breakdown - push button tested - [facility] monitoring.  

## 2022-12-06 NOTE — Progress Notes (Signed)
Pharmacy Antibiotic Note  Damon [name] is a 62 y.o. male admitted on 10/03/2022 with hospital acquired pneumonia. Pharmacy has been consulted for cefepime changed to zosyn dosing.  WBC 10.9, afeb. Scr remains stable at 0.55 (CrCl 80 mL/min). Currently on day 5 of antibiotics.   Plan: Zosyn 3.375g IV every 8 hours  Monitor for signs of clinical improvement, renal function, fever curve, WBC Will defer to hospitalist on total duration of therapy  Height: 5' 4"$  (162.6 cm) Weight: 57.6 kg (127 lb) IBW/kg (Calculated) : 59.2  Temp (24hrs), Avg:98.4 F (36.9 C), Min:97.9 F (36.6 C), Max:99.5 F (37.5 C)  Recent Labs  Lab 11/30/22 0333 12/03/22 0247 12/06/22 0304  WBC 11.4* 9.5 10.9*  CREATININE 0.55* 0.63 0.55*     Estimated Creatinine Clearance: 80 mL/min (A) (by C-G formula based on SCr of 0.55 mg/dL (L)).    No Known Allergies  Antimicrobials this admission: 12/20 Azith x 1 12/20 CTX >>  12/26 12/20 Flagyl>>12/22 12/21 Tamiflu>>12/25 12/20 zosyn>12/20, 2/19 >> Cefepime 1/22 >> 1/29, 2/18>> 2/19  Microbiology results: 12/18 Flu A + 12/20 BCx - 1/4 S.lugdunensis & S.epi - CCM thinks contaminants 12/20 UCx - negative 12/20 Strep UAg + 12/20 TA - negative 12/20 MRSA PCR: neg 12/26 BCx - negative 1/1 TA - negative 1/9 TA - negative 1/13 TA - neg 1/13 BCx - neg 1/17 MRSA PCR neg 1/21 BCx: MGTD 1/21 TA: GNR on Gram stain 2/18 Resp Cx: PA and GPC >> few pseudomonas aeruginosa (R imipenem) 2/21 Bcx: ngtd  Thank you for allowing pharmacy to be a part of this patient's care.  Antonietta Jewel, PharmD, [place] Clinical Pharmacist  Phone: [phone] 12/06/2022 11:16 AM  Please check AMION for all [place] phone numbers After 10:00 PM, call [place] [phone]

## 2022-12-06 NOTE — TOC Progression Note (Addendum)
Transition of Care [facility]) - Progression Note    Patient Details  Name: Damon [name] MRN: [id number] Date of Birth: Sep 28, 1961  Transition of Care [facility]) CM/SW [place], [place] Phone Number: 12/06/2022, 12:25 PM  Clinical Narrative:     No current trach/SNF bed offers. CSW plans on following up with [facility] beginning of next as they requested to re look at patients referral. CSW called and LVM for [facility] to see if they can offer trach/SNF bed for patient. CSW updated patients spouse Damon [name].CSW awaiting call back. CSW will continue to follow.      Expected Discharge Plan:  (TBD)    Expected Discharge Plan and Services In-house Referral: Clinical Social Work Discharge Planning Services: CM Consult Post Acute Care Choice: NA Living arrangements for the past 2 months: [place]                   DME Agency: NA                   Social Determinants of Health (SDOH) Interventions [place]: No Food Insecurity (10/04/2022)  Housing: Low Risk  (10/04/2022)  Transportation Needs: No Transportation Needs (10/04/2022)  Utilities: Not At Risk (10/04/2022)  Tobacco Use: High Risk (11/02/2022)    Readmission Risk Interventions     No data to display

## 2022-12-06 NOTE — Plan of Care (Signed)

## 2022-12-06 NOTE — Progress Notes (Signed)
EEG complete - results pending 

## 2022-12-06 NOTE — Progress Notes (Signed)
Neurology Progress Note  Patient ID: Damon [name] is a 62 y.o. with past medical history significant for hypertension, hyperlipidemia, tobacco abuse, chronic back pain who presented on 10/03/2022 with influenza A with superimposed pneumococcal pneumonia and unfortunately suffered a cardiopulmonary arrest and ARDS with course complicated by his severe anoxic brain injury, with very difficult to control myoclonic seizures on prolonged EEG monitoring.  At one point during goals of care discussions was changed to DNR status but subsequently family has made the decision to continue with FULL CODE goals of care.   Neurology is asked to return to evaluate new movements  Notable interval events:  -He has developed new shivering versus shaking movements, which has been associated with increased paroxysmal sympathetic hyperactivity (sinus tachycardia, hypotension; on Neurontin and propranolol) -Clonazepam has been discontinued since 1/6, tracheostomy placed 1/18 -He was treated with cefepime, last dose 2/19 @ 0026, subsequently changed to piperacillin/tazobactam for Pseudomonas pneumonia versus tracheitis  Subjective: - Minimally interactive   Exam: Vitals:   12/06/22 1942 12/06/22 2000  BP: (!) 128/94   Pulse: (!) 118   Resp: 20   Temp: 99.2 F (37.3 C)   SpO2: 98% 100%   Gen: In bed, comfortable  Resp: Tracheostomy in place, somewhat coarse breath sounds Cardiac: Perfusing extremities well  Abd: soft, nt  Neuro: MS/cranial nerves: Eyes open, blinking intermittently, but no blink to threat.  Does not attend to examiner or track.  Does not follow any commands. Right gaze preference.  At times gaze is midline and briefly will look to the left, but VOR is suppressed towards the left.  Blinking intermittently.  Coughing intermittently spontaneously.  Face grossly symmetric Sensory/motor: Most brisk in the right upper extremity, which does show some movement when noxious stimulation is applied to  the left upper extremity.  Flexion posturing versus a withdrawal to noxious nailbed stimulation but no movement to noxious stimulation in other parts of the arm.  Similar but less brisk flexion posturing versus withdrawal to noxious nailbed stimulation of the left upper extremity.  Likely triple flexion of the bilateral lower extremities again only to distal stimulation with minimal response to proximal stimulation, the right leg is significantly brisker than the left Brief intermittent myoclonic movements of the lips, not clearly correlated with EEG spikes on my bedside evaluation.  Brief right leg straightening movements, not clearly correlated with EEG spikes on my bedside evaluation  Pertinent Labs:  Basic Metabolic Panel: Recent Labs  Lab 11/30/22 0333 12/03/22 0247 12/06/22 0304  NA 135 135 135  K 4.3 4.2 4.1  CL 99 101 101  CO2 29 23 24  $ GLUCOSE 136* 103* 118*  BUN 42* 38* 30*  CREATININE 0.55* 0.63 0.55*  CALCIUM 9.0 9.3 9.1  MG  --   --  2.1  PHOS  --   --  4.1    CBC: Recent Labs  Lab 11/30/22 0333 12/03/22 0247 12/06/22 0304  WBC 11.4* 9.5 10.9*  NEUTROABS  --   --  6.9  HGB 11.0* 11.7* 11.5*  HCT 34.0* 36.0* 35.7*  MCV 87.9 87.8 89.3  PLT 258 263 270    Coagulation Studies: No results for input(s): "LABPROT", "INR" in the last 72 hours.    Routine EEG:  ABNORMALITY - Spike,generalized - Continuous slow, generalized IMPRESSION: This study  showed evidence of generalized epileptogenicity as well as profound diffuse encephalopathy.This EEG pattern is consistent with patient's history of anoxic/hypoxic brain injury.  Patient was noted to have repetitive episodes of right leg  elevation. Some episodes showed concomitant eeg change suggestive of myoclonic seizure but not all making it difficult to certainly say if they are myoclonic seizure or not. Clinical correlation is recommended  Impression: Equivocal EEG findings, not clearly correlating to myoclonic  movements on my examination.  Will trial addition of Klonopin to see how this changes EEG and movements  Recommendations: -Continue Keppra 1500 mg twice daily, max dose for current renal function -Continue Depakote 500 mg 3 times daily, and check level tonight to guide potential dose adjustment -Will start clonazepam 1 mg twice daily to assess response, may be discontinued -LTM EEG to monitor for now  Lesleigh Noe MD-PhD [facility] [phone]  Available 7 AM to 7 PM, outside these hours please contact Neurologist on call listed on AMION  Late entry today due to consult volume, will continue to follow along

## 2022-12-07 DIAGNOSIS — J9601 Acute respiratory failure with hypoxia: Secondary | ICD-10-CM | POA: Diagnosis not present

## 2022-12-07 DIAGNOSIS — R4182 Altered mental status, unspecified: Secondary | ICD-10-CM | POA: Diagnosis not present

## 2022-12-07 DIAGNOSIS — G931 Anoxic brain damage, not elsewhere classified: Secondary | ICD-10-CM | POA: Diagnosis not present

## 2022-12-07 LAB — CBC WITH DIFFERENTIAL/PLATELET
Abs Immature Granulocytes: 0.42 10*3/uL — ABNORMAL HIGH (ref 0.00–0.07)
Basophils Absolute: 0.1 10*3/uL (ref 0.0–0.1)
Basophils Relative: 1 %
Eosinophils Absolute: 0.2 10*3/uL (ref 0.0–0.5)
Eosinophils Relative: 2 %
HCT: 32.9 % — ABNORMAL LOW (ref 39.0–52.0)
Hemoglobin: 10.7 g/dL — ABNORMAL LOW (ref 13.0–17.0)
Immature Granulocytes: 4 %
Lymphocytes Relative: 18 %
Lymphs Abs: 1.8 10*3/uL (ref 0.7–4.0)
MCH: 28.6 pg (ref 26.0–34.0)
MCHC: 32.5 g/dL (ref 30.0–36.0)
MCV: 88 fL (ref 80.0–100.0)
Monocytes Absolute: 1.7 10*3/uL — ABNORMAL HIGH (ref 0.1–1.0)
Monocytes Relative: 17 %
Neutro Abs: 5.7 10*3/uL (ref 1.7–7.7)
Neutrophils Relative %: 58 %
Platelets: 272 10*3/uL (ref 150–400)
RBC: 3.74 MIL/uL — ABNORMAL LOW (ref 4.22–5.81)
RDW: 15.7 % — ABNORMAL HIGH (ref 11.5–15.5)
WBC: 9.9 10*3/uL (ref 4.0–10.5)
nRBC: 0 % (ref 0.0–0.2)

## 2022-12-07 LAB — COMPREHENSIVE METABOLIC PANEL
ALT: 35 U/L (ref 0–44)
AST: 26 U/L (ref 15–41)
Albumin: 2.3 g/dL — ABNORMAL LOW (ref 3.5–5.0)
Alkaline Phosphatase: 75 U/L (ref 38–126)
Anion gap: 8 (ref 5–15)
BUN: 31 mg/dL — ABNORMAL HIGH (ref 6–20)
CO2: 24 mmol/L (ref 22–32)
Calcium: 9.1 mg/dL (ref 8.9–10.3)
Chloride: 104 mmol/L (ref 98–111)
Creatinine, Ser: 0.55 mg/dL — ABNORMAL LOW (ref 0.61–1.24)
GFR, Estimated: >60 mL/min (ref >60)
Glucose, Bld: 94 mg/dL (ref 70–99)
Potassium: 4.3 mmol/L (ref 3.5–5.1)
Sodium: 136 mmol/L (ref 135–145)
Total Bilirubin: 0.5 mg/dL (ref 0.3–1.2)
Total Protein: 7 g/dL (ref 6.5–8.1)

## 2022-12-07 LAB — PATHOLOGIST SMEAR REVIEW: Path Review: ABNORMAL

## 2022-12-07 LAB — GLUCOSE, CAPILLARY
Glucose-Capillary: 94 mg/dL (ref 70–99)
Glucose-Capillary: 119 mg/dL — ABNORMAL HIGH (ref 70–99)
Glucose-Capillary: 116 mg/dL — ABNORMAL HIGH (ref 70–99)
Glucose-Capillary: 119 mg/dL — ABNORMAL HIGH (ref 70–99)
Glucose-Capillary: 106 mg/dL — ABNORMAL HIGH (ref 70–99)

## 2022-12-07 LAB — MAGNESIUM: Magnesium: 2.1 mg/dL (ref 1.7–2.4)

## 2022-12-07 LAB — PHOSPHORUS: Phosphorus: 4.2 mg/dL (ref 2.5–4.6)

## 2022-12-07 NOTE — Plan of Care (Signed)

## 2022-12-07 NOTE — Progress Notes (Signed)
   12/07/22 1229  Spiritual Encounters  Type of Visit Initial  Care provided to: [facility] partners present during encounter Nurse  Referral source Nurse (RN/NT/LPN)  Reason for visit Advance directives  OnCall Visit Yes  Advance Directives (For Healthcare)  Does Patient Have a Medical Advance Directive? Unable to assess, patient is non-responsive or altered mental status   Received call from nursing station that spouse wanted to discuss advance directive. Per [place] Soil scientist), Chaplain visited patient's room and discuss advance directive and it's purpose. Unable to complete with patient as patient unresponsive. Spouse determined in patient's case it would not be necessary.

## 2022-12-07 NOTE — Procedures (Addendum)
Patient Name: Damon [name]  MRN: [id number]  Epilepsy Attending: Lora Havens  Referring Physician/Provider: Lora Havens, MD   Duration: 12/06/2022 1818 to 12/07/2022 1818   Patient history: 62yo M s/p cardiac arrest. EEG to evaluate for seizure    Level of alertness: lethargic   AEDs during EEG study: LEV, VPA, Clonazepam   Technical aspects: This EEG study was done with scalp electrodes positioned according to the 10-20 International system of electrode placement. Electrical activity was reviewed with band pass filter of 1-'70Hz'$ , sensitivity of 7 uV/mm, display speed of 51m/sec with a '60Hz'$  notched filter applied as appropriate. EEG data were recorded continuously and digitally stored.  Video monitoring was available and reviewed as appropriate.   Description: EEG showed continuous generalized 5-'9hz'$  theta-delta slowing admixed with intermittent 2-'3Hz'$  delta slowing. At the beginning of study, generalized periodic spikes were noted, at 1-'2hz'$  which gradually improved after 12/06/5022 at 2215. Gradually, the periodic spike recurred , at 1-'2Hz'$  but were noted mostly when awake/stimulated.   Hyperventilation and photic stimulation were not performed.      ABNORMALITY - Periodic spike,generalized - Continuous slow, generalized   IMPRESSION: This study  showed evidence of generalized epileptogenicity as well as profound diffuse encephalopathy.This EEG pattern is consistent with patient's history of anoxic/hypoxic brain injury.    EEG appears to be improved compared to previous day.    Kristia Jupiter OBarbra Sarks

## 2022-12-07 NOTE — TOC Progression Note (Addendum)
Transition of Care [facility]) - Progression Note    Patient Details  Name: Damon [name] MRN: [id number] Date of Birth: July 26, 1961  Transition of Care [facility]) CM/SW [place], [place] Phone Number: 12/07/2022, 12:08 PM  Clinical Narrative:     CSW received call from patients spouse [name] who had questions regarding HCPOA/making decisions for patient. CSW paged [place]. Michail Jewels called CSW back and informed CSW Anastasia Pall will go see patients spouse in room to assist with questions regarding HCPOA. CSW informed patients spouse. Patients spouse thanked CSW. CSW to follow up with patients spouse with progress of placement. CSW will continue to follow.  Update-3:25pm- [facility] confirmed they do not take trachs unable to offer SNF bed. No current trach/snf bed offers. CSW plans to follow up with [facility] on Monday to see if they can offer trach/SNF bed for patient.  CSW LVM for Whitney with central intake regarding possible trach/SNF offer with [facility] and [facility]. Currently referral in hub says considering, CSW will continue to follow.  Expected Discharge Plan:  (TBD)    Expected Discharge Plan and Services In-house Referral: Clinical Social Work Discharge Planning Services: CM Consult Post Acute Care Choice: NA Living arrangements for the past 2 months: [place]                   DME Agency: NA                   Social Determinants of Health (SDOH) Interventions [place]: No Food Insecurity (10/04/2022)  Housing: Low Risk  (10/04/2022)  Transportation Needs: No Transportation Needs (10/04/2022)  Utilities: Not At Risk (10/04/2022)  Tobacco Use: High Risk (11/02/2022)    Readmission Risk Interventions     No data to display

## 2022-12-07 NOTE — Progress Notes (Addendum)
[facility] PROGRESS NOTE  Damon [name] (DOB: 11-25-1960) [id number] [facility]  Brief Narrative: Damon [name] is Damon [name] 62 y.o. male with Damon [name] history of HTN, HLD, tobacco use, chronic back pain who presented to the [facility] on 10/03/2022 with influenza [name]. He's had [name] protracted hospitalization summarized below.  Significant Events 12/18 Flu Mccall Lomax positive 12/20 Admitted, bipap, 5 minute code MRSA PCR - neg Urine strep _POS 12/21 Intubated, paralyzed, ARDS protocol, on 7cc/kg 12/22 Weaned to 6 cc/kg with dyssynchrony, back on 7 cc/kg, driving pressures good, weaning vent 12/23 Attempt to wean sedation again with dyssynchrony and desaturation.  Some concern for cuff leak, tube exchange.  Cuff did not appear to be blown.  Ongoing signs of cuff leak, query leaking around cough, possible large trachea, diuresing 12/25 tolerating PSV, myoclonus> ceribell, Neuro consult, changed to propofol, diuresed  12/26 Changed to DNR. On Propofol, versed.  Tolerating PSV.  MRI brain with hypoxic/anoxic injury Blood culture neg 12/27 GPD's on EEG, pending transfer to [facility] for cEEG NEURO CONSULT 12/28 moved to [facility] for LTM EEG 12/31 family updated by neuro: no chance for meaningful recovery, they should make plans for comfort measures 1/1 cultures - resp normal flora 1/2 eeg still with epileptogenicity. Palli fam mtg -- family does not want to hear from medical teams unless there is "good news"  1/3  cEEG with burst suppression w highly epileptiform bursts.  1/4 cEEG with burst suppression  + highly epileptiform bursts still.  WBC slightly up to 22.5. Temp high 99 low 100  c-EEG stopped duie to lack of benefit Pallative care consult - wife upset about early dc from ER on 12/18 Chaplain consult: Wife is sufering from anticipatory grief + accepting god's way + curious/anger about events leading upto current state EEG: generalized epileptogenicity with high potential for seizures as well as  profound diffuse encephalopathy. In the setting of cardiac arrest, this EEG pattern is suggestive of anoxic/hypoxic brain injury.  1/5 - Low grade fever +. [place] 22.5K.  on Zosyn. On vent fio2 30%. TF +. Per RN - diprivan stopped yesterday. No myoclonus today. Mild tachycardia + Rpt pall care on 10/21/22 per wife 1/6 - On vent FiO2 30%.  Tmax 99,46F ., WBC better. Still off diprivan -> No myoclonus. cXR visualized - devices in place and faiure clear Oral oxy and klonpin stopped 1/8 No significant changes in neuro status. Tachycardic to 130s, hypertensive to 140s. Coreg transitioned to metoprolol. 1/11 wife considering trach/peg, to discuss with surgery-discussed with Dr. Bobbye Morton 1/12 more tachycardic 1/16-spoke with wife at length at bedside, Dr. Hulen Skains also had [name] long conversation with spouse-desires to continue current aggressive care and desires PEG and trach placed 1/18 perc trach and peg 11/05/2022 transferred to floor due to need for ICU bed 11/08/2022 transition to [facility] with pulmonary following weekly for trach 2/4 recurrent fever, cultures collected. 2/5 changed to cuffless trach 2/18 antibiotics started for Pseudomonas on tracheal aspirate cultre 2/22 concern for myoclonic seizure   Subjective: Wife at bedside, she's just spoken to neurology  Objective: BP (!) 90/59   Pulse (!) 114   Temp 98.3 F (36.8 C) (Axillary)   Resp 18   Ht '5\' 4"'$  (1.626 m)   Wt 58.5 kg   SpO2 95%   BMI 22.14 kg/m   General: comatose. Cardiovascular: mild tachy. Lungs: unlabored Abdomen: Soft, nontender, nondistended  Neurological: comatose, no movement of RLE today Extremities: No clubbing or cyanosis. No edema Assessment & Plan:  Goals of Care: IPAL from 1/24 reviewed. He had been DNR, but transitioned back to full code. - Remains full code per extensive discussions with patient's spouse.  - We will continue all aggressive medical work up/interventions as indicated. Previous  provider discussed the rationale that supports transferring to SNF level of care but still no need to laterally transfer to another hospital at this time. She apparently reluctantly accepted this.  Discussed concern for seizures today, she'll be here tomorrow for more updates.    Anoxic brain injury Myoclonic Seizure activity  Neurology discussed with family on 12/31. 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  Signed off 12/31. Suspect the shivering maybe related to this? EEG 2/22 consistent with anoxic/hypoxic brain injury.  EEG changes c/w myoclonic seizure.  Neurology to evaluate, appreciate assistance.  - Continue keppra, depakene - added clonazepam BID (if causing sedation, can discontinue)   Acute hypoxic respiratory failure, ARDS due to influenza [name] (+12/18) and superimposed Pneumococcal pneumonia: - s/p tamiflu 12/21-12/25 and ceftriaxone 12/20 - 12/26. Then had zosyn x7 days starting 12/31 per report. Tolerated this well.  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 Appreciate PCCM involvement for management. Site appears stable, oxygenation stable. - Having frequent suctioning of white secretions - repeat cxr (persistent R basilar atelectasis)  PEA arrest:  - Continue cardiac monitoring, electrolyte monitoring.    Recurrent Fevers  Leukocytosis: Intermittent fevers throughout hospital stay. Again febrile 2/4. No change to exam of wound or lungs, no change to oxygenation. Last true fever 1/25 while on cefepime (1/22 - 1/28). Respiratory cultures 1/1, 1/9, 1/13, 1/21 with normal flora. Blood cultures 12/26, 1/13, 1/21, 1/29, 2/4 no growth. Had coag negative staph at presentation, suspect that was contaminant. - Sputum culture checked 2/17 growing few pseudomonas. Continue zosyn (2/19-present).  Plan for 7 day course.  Persistent R basilar atelectasis  Pseudomonas  pneumonia vs. tracheitis:  - Susceptibility testing completed, resistant to imipenem. Continue zosyn, confirmed to be sensitive. Note WBC normalized 2/19, vital signs stable.   Constipation: Abd benign - Continue scheduled senokot. Having regular BMs now.  Elevated LFT's: Mild, stable. No cholestatic elevations. - Monitor in intermittently.    Paroxysmal sympathetic hyperactivity: Causing sinus tachycardia, hypotension.  - Continue propranolol at '40mg'$  q8h dose and neurontin.  - continue metoprolol prn for tachycardia   Hypotension - has been intermittent in the past, will monitoring - holding parameters for propanolol     Severe protein calorie malnutrition:  - Continue tube feeds thru PEG placed 11/01/2022. At goal rate and tolerating.    Prediabetes: HbA1c 5.9% on 12/20.  - SSI q4h   Hypernatremia: Mild, resolved with increased free water.   - Continue free water 261m q4h.    Diarrhea - will monitor for now, holding laxatives  Pressure Ulcers Pressure Injury 10/12/22 Sacrum Mid;Upper Stage 2 -  Partial thickness loss of dermis presenting as Markhi Kleckner shallow open injury with Danasha Melman red, pink wound bed without slough. (Active)  10/12/22 0200  Location: Sacrum  Location Orientation: Mid;Upper  Staging: Stage 2 -  Partial thickness loss of dermis presenting as Safwan Tomei shallow open injury with Lysander Calixte red, pink wound bed without slough.  Wound Description (Comments):   Present on Admission: Yes   CFayrene Helper MD [facility] [email or web address] 12/07/2022, 3:30 PM

## 2022-12-07 NOTE — Progress Notes (Signed)
Subjective: Wife at bedside.  States patient was able to open his eyes and look at wife earlier in the week.  States he does not follow any commands.  Is very upset with going health because patient was not transferred to [facility] or [facility] or [facility] when he initially suffered the anoxic injury.  ROS: Unable to obtain due to poor mental status  Examination  Vital signs in last 24 hours: Temp:  [98.3 F (36.8 C)-99.2 F (37.3 C)] 98.3 F (36.8 C) (02/23 1143) Pulse Rate:  [105-127] 105 (02/23 1143) Resp:  [16-29] 23 (02/23 1143) BP: (82-129)/(56-95) 82/59 (02/23 1143) SpO2:  [92 %-100 %] 97 % (02/23 1143) FiO2 (%):  [21 %-28 %] 21 % (02/23 0801) Weight:  [58.5 kg] 58.5 kg (02/23 0405)  General: lying in bed, Trach+ Neuro: Opens eyes to noxious stimulation but does not look at examiner, PERRLA, no gaze deviation, does not follow commands, withdraws to noxious stimuli in bilateral lower extremities with elevation of right lower extremity, subtle withdrawal in bilateral upper extremities with noxious stimulation  Basic Metabolic Panel: Recent Labs  Lab 12/03/22 0247 12/06/22 0304 12/07/22 0331  NA 135 135 136  K 4.2 4.1 4.3  CL 101 101 104  CO2 '23 24 24  '$ GLUCOSE 103* 118* 94  BUN 38* 30* 31*  CREATININE 0.63 0.55* 0.55*  CALCIUM 9.3 9.1 9.1  MG  --  2.1 2.1  PHOS  --  4.1 4.2    CBC: Recent Labs  Lab 12/03/22 0247 12/06/22 0304 12/07/22 0331  WBC 9.5 10.9* 9.9  NEUTROABS  --  6.9 5.7  HGB 11.7* 11.5* 10.7*  HCT 36.0* 35.7* 32.9*  MCV 87.8 89.3 88.0  PLT 263 270 272     Coagulation Studies: No results for input(s): "LABPROT", "INR" in the last 72 hours.  Imaging No new brain imaging  ASSESSMENT AND PLAN: 62 year old male with minimally responsive state secondary to anoxic brain injury.  Neurology was consulted again because patient was noted to have right lower extremity movements described as elevation of right leg intermittently.    Anoxic brain  injury Minimally responsive state Status post trach and PEG -EEG showed generalized periodic epileptiform discharges consistent with patient's anoxic brain injury.  There was not a definite correlate with movements of leg elevation.  Additionally on exam, these movements can be elicited by noxious stimulation and semiology does not appear to be consistent with myoclonic seizure  Recommendations -Patient's EEG did improve after adding clonazepam.  Therefore we will continue 1 mg twice daily for now.  If this is sedating patient, then okay to discontinue -Continue Keppra 1500 mg twice daily and Depakote 500 mg every 8 hours.  -Can DC LTM EEG if no seizures by tomorrow - Patient's poor prognosis has been discussed with life before and she would like to continue with full scope of care -Management of rest of comorbidities per primary team  I have spent a total of  36  minutes with the patient reviewing hospital notes,  test results, labs and examining the patient as well as establishing an assessment and plan that was discussed personally with the patient'S WIFE  > 50% of time was spent in direct patient care.   Zeb Comfort Epilepsy [facility] For questions after 5pm please refer to AMION to reach the Neurologist on call

## 2022-12-08 DIAGNOSIS — J9601 Acute respiratory failure with hypoxia: Secondary | ICD-10-CM | POA: Diagnosis not present

## 2022-12-08 DIAGNOSIS — R4182 Altered mental status, unspecified: Secondary | ICD-10-CM | POA: Diagnosis not present

## 2022-12-08 LAB — COMPREHENSIVE METABOLIC PANEL
ALT: 31 U/L (ref 0–44)
AST: 20 U/L (ref 15–41)
Albumin: 2.3 g/dL — ABNORMAL LOW (ref 3.5–5.0)
Alkaline Phosphatase: 79 U/L (ref 38–126)
Anion gap: 9 (ref 5–15)
BUN: 34 mg/dL — ABNORMAL HIGH (ref 6–20)
CO2: 24 mmol/L (ref 22–32)
Calcium: 8.8 mg/dL — ABNORMAL LOW (ref 8.9–10.3)
Chloride: 106 mmol/L (ref 98–111)
Creatinine, Ser: 0.62 mg/dL (ref 0.61–1.24)
GFR, Estimated: >60 mL/min (ref >60)
Glucose, Bld: 133 mg/dL — ABNORMAL HIGH (ref 70–99)
Potassium: 4.1 mmol/L (ref 3.5–5.1)
Sodium: 139 mmol/L (ref 135–145)
Total Bilirubin: 0.4 mg/dL (ref 0.3–1.2)
Total Protein: 6.6 g/dL (ref 6.5–8.1)

## 2022-12-08 LAB — CBC WITH DIFFERENTIAL/PLATELET
Abs Immature Granulocytes: 0.35 10*3/uL — ABNORMAL HIGH (ref 0.00–0.07)
Basophils Absolute: 0.1 10*3/uL (ref 0.0–0.1)
Basophils Relative: 1 %
Eosinophils Absolute: 0.2 10*3/uL (ref 0.0–0.5)
Eosinophils Relative: 3 %
HCT: 33.4 % — ABNORMAL LOW (ref 39.0–52.0)
Hemoglobin: 10.6 g/dL — ABNORMAL LOW (ref 13.0–17.0)
Immature Granulocytes: 4 %
Lymphocytes Relative: 19 %
Lymphs Abs: 1.8 10*3/uL (ref 0.7–4.0)
MCH: 28 pg (ref 26.0–34.0)
MCHC: 31.7 g/dL (ref 30.0–36.0)
MCV: 88.4 fL (ref 80.0–100.0)
Monocytes Absolute: 1.6 10*3/uL — ABNORMAL HIGH (ref 0.1–1.0)
Monocytes Relative: 17 %
Neutro Abs: 5.3 10*3/uL (ref 1.7–7.7)
Neutrophils Relative %: 56 %
Platelets: 274 10*3/uL (ref 150–400)
RBC: 3.78 MIL/uL — ABNORMAL LOW (ref 4.22–5.81)
RDW: 15.9 % — ABNORMAL HIGH (ref 11.5–15.5)
WBC: 9.4 10*3/uL (ref 4.0–10.5)
nRBC: 0 % (ref 0.0–0.2)

## 2022-12-08 LAB — GLUCOSE, CAPILLARY
Glucose-Capillary: 120 mg/dL — ABNORMAL HIGH (ref 70–99)
Glucose-Capillary: 128 mg/dL — ABNORMAL HIGH (ref 70–99)
Glucose-Capillary: 134 mg/dL — ABNORMAL HIGH (ref 70–99)
Glucose-Capillary: 137 mg/dL — ABNORMAL HIGH (ref 70–99)
Glucose-Capillary: 146 mg/dL — ABNORMAL HIGH (ref 70–99)
Glucose-Capillary: 92 mg/dL (ref 70–99)

## 2022-12-08 LAB — MAGNESIUM: Magnesium: 2.2 mg/dL (ref 1.7–2.4)

## 2022-12-08 LAB — PHOSPHORUS: Phosphorus: 4.2 mg/dL (ref 2.5–4.6)

## 2022-12-08 NOTE — Progress Notes (Signed)
LTM maint complete - no skin breakdown under: Fp1 Fp2 Serviced [facility] monitored, Event button test confirmed by [facility].

## 2022-12-08 NOTE — Progress Notes (Signed)
LTM EEG discontinued - no skin breakdown at unhook.   

## 2022-12-08 NOTE — Procedures (Addendum)
Patient Name: Damon [name]  MRN: [id number]  Epilepsy Attending: Lora Havens  Referring Physician/Provider: Lora Havens, MD   Duration: 12/07/2022 1818 to 12/08/2022 [id number]   Patient history: 62yo M s/p cardiac arrest. EEG to evaluate for seizure    Level of alertness: Letthargic   AEDs during EEG study: LEV, VPA, Clonazepam   Technical aspects: This EEG study was done with scalp electrodes positioned according to the 10-20 International system of electrode placement. Electrical activity was reviewed with band pass filter of 1-'70Hz'$ , sensitivity of 7 uV/mm, display speed of 41m/sec with a '60Hz'$  notched filter applied as appropriate. EEG data were recorded continuously and digitally stored.  Video monitoring was available and reviewed as appropriate.   Description: EEG showed continuous generalized 5-'9hz'$  theta-delta slowing admixed with intermittent 2-'3Hz'$  delta slowing. Generalized periodic spike at 1-'2Hz'$  but were noted, mostly when awake/stimulated.    Hyperventilation and photic stimulation were not performed.      ABNORMALITY - Periodic spike,generalized - Continuous slow, generalized   IMPRESSION: This study  showed evidence of generalized epileptogenicity as well as profound diffuse encephalopathy.This EEG pattern is consistent with patient's history of anoxic/hypoxic brain injury.    EEG appears similar to previous day.    Zuri Bradway OBarbra Sarks

## 2022-12-08 NOTE — TOC Progression Note (Signed)
Transition of Care [facility]) - Progression Note    Patient Details  Name: Damon [name] MRN: [id number] Date of Birth: [birth year]  Transition of Care [facility]) CM/SW Contact  [address], [place], [place] Phone Number: 12/08/2022, 10:05 AM  Clinical Narrative:    Follow up phone call to Whitney with central intake [phone] to see if [facility] or [facility] can make trach/SNF bed offer. VM left requesting a return call.  Coleman Kalas, [place] Worker  [facility] Care Management [phone]    Expected Discharge Plan:  (TBD)    Expected Discharge Plan and Services In-house Referral: Clinical Social Work Discharge Planning Services: CM Consult Post Acute Care Choice: NA Living arrangements for the past 2 months: Single Family Home                   DME Agency: NA                   Social Determinants of Health (SDOH) Interventions SDOH Screenings   Food Insecurity: No Food Insecurity (10/04/2022)  Housing: Low Risk  (10/04/2022)  Transportation Needs: No Transportation Needs (10/04/2022)  Utilities: Not At Risk (10/04/2022)  Tobacco Use: High Risk (11/02/2022)    Readmission Risk Interventions     No data to display

## 2022-12-08 NOTE — Progress Notes (Signed)
[facility] PROGRESS NOTE  Damon [name] (DOB: 1960-12-18) [id number] [facility]  Brief Narrative: Damon [name] is Damon [name] 62 y.o. male with Damon [name] history of HTN, HLD, tobacco use, chronic back pain who presented to the [facility] on 10/03/2022 with influenza Damon [name]. He's had Damon [name] protracted hospitalization summarized below.  Significant Events 12/18 Flu Brihany Butch positive 12/20 Admitted, bipap, 5 minute code MRSA PCR - neg Urine strep _POS 12/21 Intubated, paralyzed, ARDS protocol, on 7cc/kg 12/22 Weaned to 6 cc/kg with dyssynchrony, back on 7 cc/kg, driving pressures good, weaning vent 12/23 Attempt to wean sedation again with dyssynchrony and desaturation.  Some concern for cuff leak, tube exchange.  Cuff did not appear to be blown.  Ongoing signs of cuff leak, query leaking around cough, possible large trachea, diuresing 12/25 tolerating PSV, myoclonus> ceribell, Neuro consult, changed to propofol, diuresed  12/26 Changed to DNR. On Propofol, versed.  Tolerating PSV.  MRI brain with hypoxic/anoxic injury Blood culture neg 12/27 GPD's on EEG, pending transfer to [facility] for cEEG NEURO CONSULT 12/28 moved to [facility] for LTM EEG 12/31 family updated by neuro: no chance for meaningful recovery, they should make plans for comfort measures 1/1 cultures - resp normal flora 1/2 eeg still with epileptogenicity. Palli fam mtg -- family does not want to hear from medical teams unless there is "good news"  1/3  cEEG with burst suppression w highly epileptiform bursts.  1/4 cEEG with burst suppression  + highly epileptiform bursts still.  WBC slightly up to 22.5. Temp high 99 low 100  c-EEG stopped duie to lack of benefit Pallative care consult - wife upset about early dc from ER on 12/18 Chaplain consult: Wife is sufering from anticipatory grief + accepting god's way + curious/anger about events leading upto current state EEG: generalized epileptogenicity with high potential for seizures as well as  profound diffuse encephalopathy. In the setting of cardiac arrest, this EEG pattern is suggestive of anoxic/hypoxic brain injury.  1/5 - Low grade fever +. [place] 22.5K.  on Zosyn. On vent fio2 30%. TF +. Per RN - diprivan stopped yesterday. No myoclonus today. Mild tachycardia + Rpt pall care on 10/21/22 per wife 1/6 - On vent FiO2 30%.  Tmax 99,43F ., WBC better. Still off diprivan -> No myoclonus. cXR visualized - devices in place and faiure clear Oral oxy and klonpin stopped 1/8 No significant changes in neuro status. Tachycardic to 130s, hypertensive to 140s. Coreg transitioned to metoprolol. 1/11 wife considering trach/peg, to discuss with surgery-discussed with Dr. Bobbye Morton 1/12 more tachycardic 1/16-spoke with wife at length at bedside, Dr. Hulen Skains also had Tashari Schoenfelder long conversation with spouse-desires to continue current aggressive care and desires PEG and trach placed 1/18 perc trach and peg 11/05/2022 transferred to floor due to need for ICU bed 11/08/2022 transition to [facility] with pulmonary following weekly for trach 2/4 recurrent fever, cultures collected. 2/5 changed to cuffless trach 2/18 antibiotics started for Pseudomonas on tracheal aspirate cultre 2/22 concern for myoclonic seizure   Subjective: Wife at bedside, she's just spoken to neurology  Objective: BP 105/67 (BP Location: Left Arm)   Pulse (!) 114   Temp 98.8 F (37.1 C) (Axillary)   Resp 20   Ht '5\' 4"'$  (1.626 m)   Wt 58.5 kg   SpO2 96%   BMI 22.14 kg/m   General: comatose. Cardiovascular: mild tachy. Lungs: unlabored Abdomen: Soft, nontender, nondistended  Neurological: comatose, no movement of RLE today Extremities: No clubbing or cyanosis. No edema Assessment &  Plan:  Goals of Care: IPAL from 1/24 reviewed. He had been DNR, but transitioned back to full code. - Remains full code per extensive discussions with patient's spouse.  - We will continue all aggressive medical work up/interventions as  indicated. Previous provider discussed the rationale that supports transferring to SNF level of care but still no need to laterally transfer to another hospital at this time. She apparently reluctantly accepted this.  Discussed concern for seizures today, she'll be here tomorrow for more updates.    Anoxic brain injury Myoclonic Seizure activity  Neurology discussed with family on 12/31. 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  Signed off 12/31. Suspect the shivering maybe related to this? EEG 2/22 consistent with anoxic/hypoxic brain injury.  EEG changes c/w myoclonic seizure.  Neurology to evaluate, appreciate assistance (2/24 last note).  - Continue keppra, depakene - added clonazepam BID (if causing sedation, can discontinue) - LTM d/c'd by neurology on 2/24   Acute hypoxic respiratory failure, ARDS due to influenza Aldine [name] (+12/18) and superimposed Pneumococcal pneumonia: - s/p tamiflu 12/21-12/25 and ceftriaxone 12/20 - 12/26. Then had zosyn x7 days starting 12/31 per report. Tolerated this well.  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 Appreciate PCCM involvement for management. Site appears stable, oxygenation stable. - Having frequent suctioning of white secretions - repeat cxr (persistent R basilar atelectasis)  PEA arrest:  - Continue cardiac monitoring, electrolyte monitoring.    Recurrent Fevers  Leukocytosis: Intermittent fevers throughout hospital stay. Again febrile 2/4. No change to exam of wound or lungs, no change to oxygenation. Last true fever 1/25 while on cefepime (1/22 - 1/28). Respiratory cultures 1/1, 1/9, 1/13, 1/21 with normal flora. Blood cultures 12/26, 1/13, 1/21, 1/29, 2/4 no growth. Had coag negative staph at presentation, suspect that was contaminant. - Sputum culture checked 2/17 growing few pseudomonas. Continue zosyn (2/19-present).  Plan  for 7 day course.  Persistent R basilar atelectasis  Pseudomonas pneumonia vs. tracheitis:  - Susceptibility testing completed, resistant to imipenem. Continue zosyn, confirmed to be sensitive. Note WBC normalized 2/19, vital signs stable.   Constipation: Abd benign - Continue scheduled senokot. Having regular BMs now.  Elevated LFT's: Mild, stable. No cholestatic elevations. - Monitor in intermittently.    Paroxysmal sympathetic hyperactivity: Causing sinus tachycardia, hypotension.  - Continue propranolol at '40mg'$  q8h dose and neurontin.  - continue metoprolol prn for tachycardia   Hypotension - has been intermittent in the past, will monitoring - holding parameters for propanolol     Severe protein calorie malnutrition:  - Continue tube feeds thru PEG placed 11/01/2022. At goal rate and tolerating.    Prediabetes: HbA1c 5.9% on 12/20.  - SSI q4h   Hypernatremia: Mild, resolved with increased free water.   - Continue free water 282m q4h.    Diarrhea - will monitor for now, holding laxatives  Pressure Ulcers Pressure Injury 10/12/22 Sacrum Mid;Upper Stage 2 -  Partial thickness loss of dermis presenting as Keisuke Hollabaugh shallow open injury with Xan Sparkman red, pink wound bed without slough. (Active)  10/12/22 0200  Location: Sacrum  Location Orientation: Mid;Upper  Staging: Stage 2 -  Partial thickness loss of dermis presenting as Edel Rivero shallow open injury with Jailey Booton red, pink wound bed without slough.  Wound Description (Comments):   Present on Admission: Yes   CFayrene Helper MD [facility] [email or web address] 12/08/2022, 7:04 PM

## 2022-12-09 ENCOUNTER — Inpatient Hospital Stay (HOSPITAL_COMMUNITY): Payer: Medicare Other

## 2022-12-09 DIAGNOSIS — J9601 Acute respiratory failure with hypoxia: Secondary | ICD-10-CM | POA: Diagnosis not present

## 2022-12-09 LAB — CBC WITH DIFFERENTIAL/PLATELET
Abs Immature Granulocytes: 0 10*3/uL (ref 0.00–0.07)
Basophils Absolute: 0.1 10*3/uL (ref 0.0–0.1)
Basophils Relative: 1 %
Eosinophils Absolute: 0.1 10*3/uL (ref 0.0–0.5)
Eosinophils Relative: 1 %
HCT: 35.6 % — ABNORMAL LOW (ref 39.0–52.0)
Hemoglobin: 11.3 g/dL — ABNORMAL LOW (ref 13.0–17.0)
Lymphocytes Relative: 23 %
Lymphs Abs: 2.2 10*3/uL (ref 0.7–4.0)
MCH: 28.3 pg (ref 26.0–34.0)
MCHC: 31.7 g/dL (ref 30.0–36.0)
MCV: 89 fL (ref 80.0–100.0)
Monocytes Absolute: 1.3 10*3/uL — ABNORMAL HIGH (ref 0.1–1.0)
Monocytes Relative: 14 %
Neutro Abs: 5.9 10*3/uL (ref 1.7–7.7)
Neutrophils Relative %: 61 %
Platelets: 262 10*3/uL (ref 150–400)
RBC: 4 MIL/uL — ABNORMAL LOW (ref 4.22–5.81)
RDW: 16 % — ABNORMAL HIGH (ref 11.5–15.5)
WBC: 9.6 10*3/uL (ref 4.0–10.5)
nRBC: 0 /100 WBC
nRBC: 0.3 % — ABNORMAL HIGH (ref 0.0–0.2)

## 2022-12-09 LAB — COMPREHENSIVE METABOLIC PANEL
ALT: 30 U/L (ref 0–44)
AST: 24 U/L (ref 15–41)
Albumin: 2.2 g/dL — ABNORMAL LOW (ref 3.5–5.0)
Alkaline Phosphatase: 73 U/L (ref 38–126)
Anion gap: 6 (ref 5–15)
BUN: 33 mg/dL — ABNORMAL HIGH (ref 6–20)
CO2: 23 mmol/L (ref 22–32)
Calcium: 8.8 mg/dL — ABNORMAL LOW (ref 8.9–10.3)
Chloride: 110 mmol/L (ref 98–111)
Creatinine, Ser: 0.63 mg/dL (ref 0.61–1.24)
GFR, Estimated: >60 mL/min (ref >60)
Glucose, Bld: 109 mg/dL — ABNORMAL HIGH (ref 70–99)
Potassium: 4.1 mmol/L (ref 3.5–5.1)
Sodium: 139 mmol/L (ref 135–145)
Total Bilirubin: 0.5 mg/dL (ref 0.3–1.2)
Total Protein: 6.7 g/dL (ref 6.5–8.1)

## 2022-12-09 LAB — SEDIMENTATION RATE: Sed Rate: 69 mm/hr — ABNORMAL HIGH (ref 0–16)

## 2022-12-09 LAB — MRSA NEXT GEN BY PCR, NASAL: MRSA by PCR Next Gen: NOT DETECTED

## 2022-12-09 LAB — C-REACTIVE PROTEIN: CRP: 3 mg/dL — ABNORMAL HIGH (ref <1.0)

## 2022-12-09 LAB — GLUCOSE, CAPILLARY
Glucose-Capillary: 107 mg/dL — ABNORMAL HIGH (ref 70–99)
Glucose-Capillary: 112 mg/dL — ABNORMAL HIGH (ref 70–99)
Glucose-Capillary: 116 mg/dL — ABNORMAL HIGH (ref 70–99)
Glucose-Capillary: 125 mg/dL — ABNORMAL HIGH (ref 70–99)
Glucose-Capillary: 141 mg/dL — ABNORMAL HIGH (ref 70–99)
Glucose-Capillary: 141 mg/dL — ABNORMAL HIGH (ref 70–99)

## 2022-12-09 LAB — PHOSPHORUS: Phosphorus: 3.3 mg/dL (ref 2.5–4.6)

## 2022-12-09 LAB — PROCALCITONIN: Procalcitonin: <0.1 ng/mL

## 2022-12-09 LAB — MAGNESIUM: Magnesium: 2.2 mg/dL (ref 1.7–2.4)

## 2022-12-09 NOTE — Progress Notes (Signed)
Preliminary results of respiratory culture collected today show growth. Temp and BP especially are somewhat elevated. Notified charge RN.    12/09/22 2204  Vitals  Temp 100.2 F (37.9 C)  Temp Source Axillary  BP (!) 141/97  MAP (mmHg) 110  BP Location Left Arm  BP Method Automatic  Patient Position (if appropriate) Lying  Pulse Rate (!) 121  Pulse Rate Source Monitor  ECG Heart Rate (!) 123  Resp (!) 32  MEWS COLOR  MEWS Score Color Red  Oxygen Therapy  SpO2 97 %  O2 Device Tracheostomy Collar  O2 Flow Rate (L/min) 6 L/min  FiO2 (%) 21 %  Pain Assessment  Pain Scale CPOT  Critical Care Pain Observation Tool (CPOT)  Facial Expression 0  Body Movements 0  Muscle Tension 0  Compliance with ventilator (intubated pts.) N/A  Vocalization (extubated pts.) 0  CPOT Total 0  MEWS Score  MEWS Temp 0  MEWS Systolic 0  MEWS Pulse 2  MEWS RR 2  MEWS LOC 2  MEWS Score 6

## 2022-12-09 NOTE — Progress Notes (Signed)
Sputum obtained and sent to lab

## 2022-12-09 NOTE — Progress Notes (Signed)
[facility] PROGRESS NOTE  Damon [name] (DOB: [birth year]) [id number] PCP: [facility]  Brief Narrative: Damon [name] is Damon [name] 62 y.o. male with Damon [name] history of HTN, HLD, tobacco use, chronic back pain who presented to the [facility] on 10/03/2022 with influenza Damon [name]. He's had Damon [name] protracted hospitalization summarized below.  Significant Events 12/18 Flu Shakeyla Giebler positive 12/20 Admitted, bipap, 5 minute code MRSA PCR - neg Urine strep _POS 12/21 Intubated, paralyzed, ARDS protocol, on 7cc/kg 12/22 Weaned to 6 cc/kg with dyssynchrony, back on 7 cc/kg, driving pressures good, weaning vent 12/23 Attempt to wean sedation again with dyssynchrony and desaturation.  Some concern for cuff leak, tube exchange.  Cuff did not appear to be blown.  Ongoing signs of cuff leak, query leaking around cough, possible large trachea, diuresing 12/25 tolerating PSV, myoclonus> ceribell, Neuro consult, changed to propofol, diuresed  12/26 Changed to DNR. On Propofol, versed.  Tolerating PSV.  MRI brain with hypoxic/anoxic injury Blood culture neg 12/27 GPD's on EEG, pending transfer to [facility] for cEEG NEURO CONSULT 12/28 moved to [facility] for LTM EEG 12/31 family updated by neuro: no chance for meaningful recovery, they should make plans for comfort measures 1/1 cultures - resp normal flora 1/2 eeg still with epileptogenicity. Palli fam mtg -- family does not want to hear from medical teams unless there is "good news"  1/3  cEEG with burst suppression w highly epileptiform bursts.  1/4 cEEG with burst suppression  + highly epileptiform bursts still.  WBC slightly up to 22.5. Temp high 99 low 100  c-EEG stopped duie to lack of benefit Pallative care consult - wife upset about early dc from ER on 12/18 Chaplain consult: Wife is sufering from anticipatory grief + accepting god's way + curious/anger about events leading upto current state EEG: generalized epileptogenicity with high potential for seizures as well as  profound diffuse encephalopathy. In the setting of cardiac arrest, this EEG pattern is suggestive of anoxic/hypoxic brain injury.  1/5 - Low grade fever +. Damon [name] 22.5K.  on Zosyn. On vent fio2 30%. TF +. Per RN - diprivan stopped yesterday. No myoclonus today. Mild tachycardia + Rpt pall care on 10/21/22 per wife 1/6 - On vent FiO2 30%.  Tmax 99,40F ., WBC better. Still off diprivan -> No myoclonus. cXR visualized - devices in place and faiure clear Oral oxy and klonpin stopped 1/8 No significant changes in neuro status. Tachycardic to 130s, hypertensive to 140s. Coreg transitioned to metoprolol. 1/11 wife considering trach/peg, to discuss with surgery-discussed with Damon [name] 1/12 more tachycardic 1/16-spoke with wife at length at bedside, Dr. Hulen Skains also had Destenee Guerry long conversation with spouse-desires to continue current aggressive care and desires PEG and trach placed 1/18 perc trach and peg 11/05/2022 transferred to floor due to need for ICU bed 11/08/2022 transition to [facility] with pulmonary following weekly for trach 2/4 recurrent fever, cultures collected. 2/5 changed to cuffless trach 2/18 antibiotics started for Pseudomonas on tracheal aspirate cultre 2/22 concern for myoclonic seizure   Subjective: Family at bedside Noting left leg movement today  Objective: BP 118/89 (BP Location: Right Arm)   Pulse (!) 134   Temp 99 F (37.2 C) (Axillary)   Resp (!) 32   Ht '5\' 4"'$  (1.626 m)   Wt 59 kg   SpO2 98%   BMI 22.31 kg/m    General: comatose Cardiovascular: tachy Lungs: diffuse rhonchi Abdomen: Soft, nontender, nondistended  Neurological: comatose, isolated LLE twitching Extremities: isolated LLE movement today, intermittent twitching (discussed  with neuro who did not recommend repeating EEG)  Assessment & Plan:  Goals of Care: IPAL from 1/24 reviewed. He had been DNR, but transitioned back to full code. - Remains full code per extensive discussions with patient's  spouse.  - We will continue all aggressive medical work up/interventions as indicated. Previous provider discussed the rationale that supports transferring to SNF level of care but still no need to laterally transfer to another hospital at this time. She apparently reluctantly accepted this.  Discussed concern for seizures today, she'll be here tomorrow for more updates.    Anoxic brain injury Myoclonic Seizure activity  Neurology discussed with family on 12/31. 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  Signed off 12/31. Suspect the shivering maybe related to this? EEG 2/22 consistent with anoxic/hypoxic brain injury.  EEG changes c/w myoclonic seizure.  Neurology to evaluate, appreciate assistance (2/24 last note).  - leg movements on 2/23 did not correlate with seizure activity - Continue keppra, depakene - added clonazepam BID (if causing sedation, can discontinue) - LTM d/c'd by neurology on 2/24   Acute hypoxic respiratory failure, ARDS due to influenza Damon [name] (+12/18) and superimposed Pneumococcal pneumonia: - s/p tamiflu 12/21-12/25 and ceftriaxone 12/20 - 12/26. Then had zosyn x7 days starting 12/31 per report. Tolerated this well.  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 Appreciate PCCM involvement for management. Site appears stable, oxygenation stable. - Having frequent suctioning of white secretions - repeat cxr (increasing right mid lower lung zone opacity, atelectasis vs pneumonia)  - more tachyneic today, will discuss abx with ID   PEA arrest:  - Continue cardiac monitoring, electrolyte monitoring.    Recurrent Fevers  Leukocytosis: Intermittent fevers throughout hospital stay. Again febrile 2/4. No change to exam of wound or lungs, no change to oxygenation. Last true fever 1/25 while on cefepime (1/22 - 1/28). Respiratory cultures 1/1, 1/9, 1/13, 1/21 with  normal flora. Blood cultures 12/26, 1/13, 1/21, 1/29, 2/4 no growth. Had coag negative staph at presentation, suspect that was contaminant. - Sputum culture checked 2/17 growing few pseudomonas. Continue zosyn (2/19-present).  Plan for 7 day course.  Persistent R basilar atelectasis  Pseudomonas pneumonia vs. tracheitis:  - Susceptibility testing completed, resistant to imipenem. Continue zosyn, confirmed to be sensitive. Note WBC normalized 2/19, vital signs stable.   Constipation: Abd benign - Continue scheduled senokot. Having regular BMs now.  Elevated LFT's: Mild, stable. No cholestatic elevations. - Monitor in intermittently.    Paroxysmal sympathetic hyperactivity: Causing sinus tachycardia, hypotension.  - Continue propranolol at '40mg'$  q8h dose and neurontin.  - continue metoprolol prn for tachycardia   Hypotension - has been intermittent in the past, will monitoring - holding parameters for propanolol     Severe protein calorie malnutrition:  - Continue tube feeds thru PEG placed 11/01/2022. At goal rate and tolerating.    Prediabetes: HbA1c 5.9% on 12/20.  - SSI q4h   Hypernatremia: Mild, resolved with increased free water.   - Continue free water 263m q4h.    Diarrhea - will monitor for now, holding laxatives  Pressure Ulcers Pressure Injury 10/12/22 Sacrum Mid;Upper Stage 2 -  Partial thickness loss of dermis presenting as Samanthia Howland shallow open injury with Sylvia Helms red, pink wound bed without slough. (Active)  10/12/22 0200  Location: Sacrum  Location Orientation: Mid;Upper  Staging: Stage 2 -  Partial thickness loss of dermis presenting as Karter Hellmer shallow open injury with Analese Sovine red,  pink wound bed without slough.  Wound Description (Comments):   Present on Admission: Yes   Fayrene Helper, MD [facility] [email or web address] 12/09/2022, 4:25 PM

## 2022-12-09 NOTE — Progress Notes (Signed)
Dr. Curly Shores is looking into the EEG order, pt was just d/c'd late yesterday

## 2022-12-10 DIAGNOSIS — G931 Anoxic brain damage, not elsewhere classified: Secondary | ICD-10-CM | POA: Diagnosis not present

## 2022-12-10 DIAGNOSIS — Z930 Tracheostomy status: Secondary | ICD-10-CM | POA: Diagnosis not present

## 2022-12-10 DIAGNOSIS — J9601 Acute respiratory failure with hypoxia: Secondary | ICD-10-CM | POA: Diagnosis not present

## 2022-12-10 LAB — GLUCOSE, CAPILLARY
Glucose-Capillary: 120 mg/dL — ABNORMAL HIGH (ref 70–99)
Glucose-Capillary: 129 mg/dL — ABNORMAL HIGH (ref 70–99)
Glucose-Capillary: 132 mg/dL — ABNORMAL HIGH (ref 70–99)
Glucose-Capillary: 133 mg/dL — ABNORMAL HIGH (ref 70–99)
Glucose-Capillary: 148 mg/dL — ABNORMAL HIGH (ref 70–99)
Glucose-Capillary: 99 mg/dL (ref 70–99)

## 2022-12-10 LAB — CBC WITH DIFFERENTIAL/PLATELET
Abs Immature Granulocytes: 0.9 10*3/uL — ABNORMAL HIGH (ref 0.00–0.07)
Basophils Absolute: 0.1 10*3/uL (ref 0.0–0.1)
Basophils Relative: 1 %
Eosinophils Absolute: 0.4 10*3/uL (ref 0.0–0.5)
Eosinophils Relative: 4 %
HCT: 35.7 % — ABNORMAL LOW (ref 39.0–52.0)
Hemoglobin: 11.5 g/dL — ABNORMAL LOW (ref 13.0–17.0)
Lymphocytes Relative: 21 %
Lymphs Abs: 2.3 10*3/uL (ref 0.7–4.0)
MCH: 28.8 pg (ref 26.0–34.0)
MCHC: 32.2 g/dL (ref 30.0–36.0)
MCV: 89.3 fL (ref 80.0–100.0)
Metamyelocytes Relative: 1 %
Monocytes Absolute: 1.5 10*3/uL — ABNORMAL HIGH (ref 0.1–1.0)
Monocytes Relative: 14 %
Myelocytes: 4 %
Neutro Abs: 5.6 10*3/uL (ref 1.7–7.7)
Neutrophils Relative %: 52 %
Platelets: 284 10*3/uL (ref 150–400)
Promyelocytes Relative: 3 %
RBC: 4 MIL/uL — ABNORMAL LOW (ref 4.22–5.81)
RDW: 15.8 % — ABNORMAL HIGH (ref 11.5–15.5)
WBC: 10.8 10*3/uL — ABNORMAL HIGH (ref 4.0–10.5)
nRBC: 0 /100 WBC
nRBC: 0.3 % — ABNORMAL HIGH (ref 0.0–0.2)

## 2022-12-10 LAB — COMPREHENSIVE METABOLIC PANEL
ALT: 32 U/L (ref 0–44)
AST: 25 U/L (ref 15–41)
Albumin: 2.3 g/dL — ABNORMAL LOW (ref 3.5–5.0)
Alkaline Phosphatase: 74 U/L (ref 38–126)
Anion gap: 11 (ref 5–15)
BUN: 37 mg/dL — ABNORMAL HIGH (ref 6–20)
CO2: 23 mmol/L (ref 22–32)
Calcium: 9.4 mg/dL (ref 8.9–10.3)
Chloride: 101 mmol/L (ref 98–111)
Creatinine, Ser: 0.55 mg/dL — ABNORMAL LOW (ref 0.61–1.24)
GFR, Estimated: >60 mL/min (ref >60)
Glucose, Bld: 133 mg/dL — ABNORMAL HIGH (ref 70–99)
Potassium: 4.3 mmol/L (ref 3.5–5.1)
Sodium: 135 mmol/L (ref 135–145)
Total Bilirubin: 0.3 mg/dL (ref 0.3–1.2)
Total Protein: 6.9 g/dL (ref 6.5–8.1)

## 2022-12-10 LAB — PROCALCITONIN: Procalcitonin: <0.1 ng/mL

## 2022-12-10 LAB — CULTURE, BLOOD (ROUTINE X 2)
Culture: NO GROWTH
Culture: NO GROWTH

## 2022-12-10 LAB — MAGNESIUM: Magnesium: 2.1 mg/dL (ref 1.7–2.4)

## 2022-12-10 LAB — AMMONIA: Ammonia: 38 umol/L — ABNORMAL HIGH (ref 9–35)

## 2022-12-10 LAB — PHOSPHORUS: Phosphorus: 4.1 mg/dL (ref 2.5–4.6)

## 2022-12-10 MED ORDER — FUROSEMIDE 10 MG/ML IJ SOLN
40.0000 mg | Freq: Once | INTRAMUSCULAR | Status: AC
  Administered 2022-12-10: 40 mg via INTRAVENOUS
  Filled 2022-12-10: qty 4

## 2022-12-10 MED ORDER — CHLORHEXIDINE GLUCONATE CLOTH 2 % EX PADS
6.0000 | MEDICATED_PAD | Freq: Every day | CUTANEOUS | Status: DC
  Administered 2022-12-10 – 2023-01-22 (×45): 6 via TOPICAL

## 2022-12-10 MED ORDER — SCOPOLAMINE 1 MG/3DAYS TD PT72
1.0000 | MEDICATED_PATCH | TRANSDERMAL | Status: DC
  Administered 2022-12-10 – 2023-01-21 (×15): 1.5 mg via TRANSDERMAL
  Filled 2022-12-10 (×15): qty 1

## 2022-12-10 NOTE — Progress Notes (Signed)
NAME:  Damon [name], MRN:  [id number], DOB:  05/22/1961, LOS: 34 ADMISSION DATE:  10/03/2022, CONSULTATION DATE:  10/03/2022 REFERRING MD:  Marylyn Ishihara - [facility] CHIEF COMPLAINT:  Dyspnea   History of Present Illness:  62 year old man admitted to [facility] 12/20 in the setting of acute hypoxemic respiratory failure due to Flu A. PMHx significant for HTN, HLD, chronic back pain, tobacco abuse   Placed on BIPAP.  Coded (likely in the setting of hypoxia), initial rhythm PEA with CPR x 5 minutes, required intubation.  After several days on mechanical ventilation remains comatose, MRI Brain 12/26 worrisome for anoxic brain injury.  Transferred to [facility] for LTM EEG monitoring.  Neurology consulted. LTM with generalized epileptogenicity. Treated with Propofol, Depakote, Keppra, Ketamine. 12/31 Neurology with goals of care with family. Relayed that patient has a grim neurological recovery due to irreversible anoxic injury. Family wished to continue aggressive measures.   Palliative care consulted. Stay complicated with neuro-storming.   Surgical consult regarding PEG/trach  Pertinent Medical History:   Past Medical History:  Diagnosis Date   Elevated lipids    Hypertension    Tobacco abuse    Significant Hospital Events: Including procedures, antibiotic start and stop dates in addition to other pertinent events   12/18 Flu A positive 12/20 Admitted, 5 minute code after non-compliance with BiPAP. MRSA PCR - neg, Urine strep > POS 12/21 Intubated, paralyzed, ARDS protocol, on 7cc/kg 12/22 Weaned to 6 cc/kg with dyssynchrony, back on 7 cc/kg, driving pressures good, weaning vent 12/23 Attempt to wean sedation again with dyssynchrony and desaturation.  Some concern for cuff leak, tube exchange.  Cuff did not appear to be blown.  Ongoing signs of cuff leak, query leaking around cough, possible large trachea, diuresing 12/25 tolerating PSV, myoclonus> ceribell, Neuro consult, changed to propofol, diuresed  12/26  Changed to DNR. On Propofol, versed.  Tolerating PSV.  MRI brain with hypoxic/anoxic injury. Blood culture neg 12/27 GPD's on EEG, pending transfer to [facility] for cEEG. NEURO CONSULT 12/28 moved to [facility] for LTM EEG 12/31 family updated by neuro: no chance for meaningful recovery, they should make plans for comfort measures 1/1 cultures - resp normal flora 1/2 eeg still with epileptogenicity. Palli fam mtg -- family does not want to hear from medical teams unless there is "good news"  1/3  cEEG with burst suppression w highly epileptiform bursts.  1/4 cEEG with burst suppression  + highly epileptiform bursts still.  WBC slightly up to 22.5. Temp high 99 low 100  c-EEG stopped due to lack of benefit Pallative care consult - wife upset about early dc from ER on 12/18 Chaplain consult: Wife is sufering from anticipatory grief + accepting god's way + curious/anger about events leading upto current state EEG: generalized epileptogenicity with high potential for seizures as well as profound diffuse encephalopathy. In the setting of cardiac arrest, this EEG pattern is suggestive of anoxic/hypoxic brain injury.  1/5 Low grade fever +. [place] 22.5K.  on Zosyn. On vent fio2 30%. TF +. Per RN - diprivan stopped yesterday. No myoclonus today. Mild tachycardia + Rpt pall care on 10/21/22 per wife 1/6 On vent FiO2 30%.  Tmax 99,89F ., WBC better. Still off diprivan -> No myoclonus. cXR visualized - devices in place and faiure clear. Oral oxy and klonpin stopped 1/8 No significant changes in neuro status. Tachycardic to 130s, hypertensive to 140s. Coreg transitioned to metoprolol. 1/11 wife considering trach/peg, to discuss with surgery-discussed with Dr. Bobbye Morton 1/12 more tachycardic 1/16-spoke with  wife at length at bedside, Dr. Hulen Skains also had a long conversation with spouse-desires to continue current aggressive care and desires PEG and trach placed 1/18 perc trach and peg 11/05/2022 transferred to floor due to need for  ICU bed 11/08/2022 transition to [facility] with pulmonary following weekly for trach 2/5 ATC 28%, no acute issues with trach  2/26 Copious tracheal sections in the setting of pseudomonas pneumonia resulting in need to move to ICU for close   Interim History / Subjective:  Seen lying in bed with productive cough   On trach collar   Objective:  Blood pressure 108/83, pulse (!) 113, temperature 98.1 F (36.7 C), temperature source Axillary, resp. rate 17, height '5\' 4"'$  (1.626 m), weight 59 kg, SpO2 98 %.    FiO2 (%):  [21 %] 21 %   Intake/Output Summary (Last 24 hours) at 12/10/2022 1112 Last data filed at 12/10/2022 0513 Gross per 24 hour  Intake 2154.08 ml  Output 1300 ml  Net 854.08 ml   BP 108/83 (BP Location: Left Arm)   Pulse (!) 113   Temp 98.1 F (36.7 C) (Axillary)   Resp 17   Ht '5\' 4"'$  (1.626 m)   Wt 59 kg   SpO2 98%   BMI 22.31 kg/m   Filed Weights   12/06/22 1000 12/07/22 0405 12/09/22 0417  Weight: 57.6 kg 58.5 kg 59 kg   Physical Exam: General: Acute on chronically ill appearing elderly  male/male on mechanical ventilation, in NAD HEENT: ETT, MM pink/moist, PERRL,  Neuro: Unresponsive  CV: s1s2 regular rate and rhythm, no murmur, rubs, or gallops,  PULM:  Significant rhonchi bilaterally, no increased, on trach collar   GI: soft, bowel sounds active in all 4 quadrants, non-tender, non-distended, tolerating TF Extremities: warm/dry, no edema  Skin: no rashes or lesions  Resolved   Flu A AKI CAP, pneumococcal pneumonia - s/p Zosyn 12/31 - 1/7; Ceftriaxone 12/20- 12/27, Azithro 12/20, Flagyl 12/20 - 12/22  Assessment & Plan:   Anoxic brain injury, ventilator dependent s/p perc trach 1/18 Acute respiratory failure with hypoxia/ARDS secondary to flu and strep (completed rx) Seizures Paroxysmal sympathetic hyperactivity Fever Leukocytosis Anemia Hyperkalemia Tachycardia/hypotension due to paroxysmal sympathetic hyperactivity Severe  protein calorie malnutrition hyperglycemia  Discussion Trach placed 1/19.  Requires frequent suctioning. Not currently a candidate for decannulation due to frequent suctioning and mental status post arrest.   Course now complicated by pseudomonas PNA as of 12/03/22  Plan: Transfer to ICU for high suction needs  Frequent pulmonary hygiene  Chest PT  Antibiotics per primary  Routine trach care  Continue trach collar with cuffless 6 trach  Minimize sedation   PCCM will continue to follow intermittently for trach.    Signature:  Aaronmichael Brumbaugh D. Harris, NP-C [facility] Personal contact information can be found on Amion  If no contact or response made please call [phone] 12/10/2022, 11:16 AM

## 2022-12-10 NOTE — Progress Notes (Signed)
[facility] PROGRESS NOTE  Dolan Evilsizor (DOB: April 27, 1961) [id number] PCP: [facility]  Brief Narrative: Damon [name] is Heide Brossart 62 y.o. male with Jermal Dismuke history of HTN, HLD, tobacco use, chronic back pain who presented to the [facility] on 10/03/2022 with influenza Lorene Klimas. He's had Oanh Devivo protracted hospitalization summarized below.  Significant Events 12/18 Flu [place] Antolin positive 12/20 Admitted, bipap, 5 minute code MRSA PCR - neg Urine strep _POS 12/21 Intubated, paralyzed, ARDS protocol, on 7cc/kg 12/22 Weaned to 6 cc/kg with dyssynchrony, back on 7 cc/kg, driving pressures good, weaning vent 12/23 Attempt to wean sedation again with dyssynchrony and desaturation.  Some concern for cuff leak, tube exchange.  Cuff did not appear to be blown.  Ongoing signs of cuff leak, query leaking around cough, possible large trachea, diuresing 12/25 tolerating PSV, myoclonus> ceribell, Neuro consult, changed to propofol, diuresed  12/26 Changed to DNR. On Propofol, versed.  Tolerating PSV.  MRI brain with hypoxic/anoxic injury Blood culture neg 12/27 GPD's on EEG, pending transfer to [facility] for cEEG NEURO CONSULT 12/28 moved to [facility] for LTM EEG 12/31 family updated by neuro: no chance for meaningful recovery, they should make plans for comfort measures 1/1 cultures - resp normal flora 1/2 eeg still with epileptogenicity. Palli fam mtg -- family does not want to hear from medical teams unless there is "good news"  1/3  cEEG with burst suppression w highly epileptiform bursts.  1/4 cEEG with burst suppression  + highly epileptiform bursts still.  WBC slightly up to 22.5. Temp high 99 low 100  c-EEG stopped duie to lack of benefit Pallative care consult - wife upset about early dc from ER on 12/18 Chaplain consult: Wife is sufering from anticipatory grief + accepting god's way + curious/anger about events leading upto current state EEG: generalized epileptogenicity with high potential for seizures as well as  profound diffuse encephalopathy. In the setting of cardiac arrest, this EEG pattern is suggestive of anoxic/hypoxic brain injury.  1/5 - Low grade fever +. [place] 22.5K.  on Zosyn. On vent fio2 30%. TF +. Per RN - diprivan stopped yesterday. No myoclonus today. Mild tachycardia + Rpt pall care on 10/21/22 per wife 1/6 - On vent FiO2 30%.  Tmax 99,33F ., WBC better. Still off diprivan -> No myoclonus. cXR visualized - devices in place and faiure clear Oral oxy and klonpin stopped 1/8 No significant changes in neuro status. Tachycardic to 130s, hypertensive to 140s. Coreg transitioned to metoprolol. 1/11 wife considering trach/peg, to discuss with surgery-discussed with Dr. Bobbye Morton 1/12 more tachycardic 1/16-spoke with wife at length at bedside, Dr. Hulen Skains also had Tonea Leiphart long conversation with spouse-desires to continue current aggressive care and desires PEG and trach placed 1/18 perc trach and peg 11/05/2022 transferred to floor due to need for ICU bed 11/08/2022 transition to [facility] with pulmonary following weekly for trach 2/4 recurrent fever, cultures collected. 2/5 changed to cuffless trach 2/18 antibiotics started for Pseudomonas on tracheal aspirate cultre 2/22 concern for myoclonic seizure   Subjective: None at bedside Called wife, no answer - left message about transfer  Objective: BP 109/82 (BP Location: Left Arm)   Pulse (!) 116   Temp 99.1 F (37.3 C) (Axillary)   Resp (!) 29   Ht '5\' 4"'$  (1.626 m)   Wt 59 kg   SpO2 97%   BMI 22.31 kg/m    General: No acute distress. Cardiovascular: sinus tach  Lungs: scattered rhonchi  Abdomen: Soft, nontender, nondistended Neurological: comatose, no meaningful movement  Extremities: No clubbing or cyanosis. No edema.   Assessment & Plan:  Goals of Care: IPAL from 1/24 reviewed. He had been DNR, but transitioned back to full code. - Remains full code per extensive discussions with patient's spouse.  - We will continue all  aggressive medical work up/interventions as indicated. Previous provider discussed the rationale that supports transferring to SNF level of care but still no need to laterally transfer to another hospital at this time. She apparently reluctantly accepted this.  Discussed concern for seizures today, she'll be here tomorrow for more updates.    Anoxic brain injury Myoclonic Seizure activity  Neurology discussed with family on 12/31. 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  Signed off 12/31. EEG 2/22 consistent with anoxic/hypoxic brain injury.  EEG changes c/w myoclonic seizure.  Neurology to evaluate, appreciate assistance (2/24 last note).  - leg movements on 2/23 did not correlate with seizure activity - Continue keppra, depakene - added clonazepam BID (if causing sedation, can discontinue) - LTM d/c'd by neurology on 2/24   Acute hypoxic respiratory failure, ARDS due to influenza Chinara Hertzberg (+12/18) and superimposed Pneumococcal pneumonia: - s/p tamiflu 12/21-12/25 and ceftriaxone 12/20 - 12/26. Then had zosyn x7 days starting 12/31 per report. Tolerated this well.  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 Appreciate PCCM involvement for management. Site appears stable, oxygenation stable. - Having frequent suctioning of white secretions - repeat cxr (increasing right mid lower lung zone opacity, atelectasis vs pneumonia)  - being transferred to ICU to better manage secretions  PEA arrest:  - Continue cardiac monitoring, electrolyte monitoring.    Recurrent Fevers  Leukocytosis: Intermittent fevers throughout hospital stay. Again febrile 2/4. No change to exam of wound or lungs, no change to oxygenation. Last true fever 1/25 while on cefepime (1/22 - 1/28). Respiratory cultures 1/1, 1/9, 1/13, 1/21 with normal flora. Blood cultures 12/26, 1/13, 1/21, 1/29, 2/4 no growth. Had coag  negative staph at presentation, suspect that was contaminant. - Sputum culture checked 2/17 growing few pseudomonas (resistant to imipenem). Continue zosyn (2/19-present).  Plan for 7 day course.  Persistent R basilar atelectasis  Pseudomonas pneumonia vs. tracheitis:  - Susceptibility testing completed, resistant to imipenem. Continue zosyn, confirmed to be sensitive (discussed with ID, agreed). Note WBC normalized 2/19, vital signs stable. - repeat cultures pending 2/25 due to tachypnea (also with elevated temp 2/25)   Constipation: Abd benign - Continue scheduled senokot. Having regular BMs now.  Elevated LFT's: Mild, stable. No cholestatic elevations. - Monitor in intermittently.    Paroxysmal sympathetic hyperactivity: Causing sinus tachycardia, hypotension.  - Continue propranolol at '40mg'$  q8h dose and neurontin.  - continue metoprolol prn for tachycardia   Hypotension - has been intermittent in the past, will monitoring - holding parameters for propanolol     Severe protein calorie malnutrition:  - Continue tube feeds thru PEG placed 11/01/2022. At goal rate and tolerating.    Prediabetes: HbA1c 5.9% on 12/20.  - SSI q4h   Hypernatremia: Mild, resolved with increased free water.   - Continue free water 259m q4h.    Diarrhea - will monitor for now, holding laxatives - seems improved  Pressure Ulcers Pressure Injury 10/12/22 Sacrum Mid;Upper Stage 2 -  Partial thickness loss of dermis presenting as Andry Bogden shallow open injury with Rasool Rommel red, pink wound bed without slough. (Active)  10/12/22 0200  Location: Sacrum  Location Orientation: Mid;Upper  Staging: Stage 2 -  Partial thickness loss of dermis presenting as Rhylee Pucillo shallow open injury with Jermal Dismuke red, pink wound bed without slough.  Wound Description (Comments):   Present on Admission: Yes   Fayrene Helper, MD [facility] [email or web address] 12/10/2022, 12:54 PM

## 2022-12-10 NOTE — TOC Progression Note (Addendum)
Transition of Care [facility]) - Progression Note    Patient Details  Name: Damon [name] MRN: [id number] Date of Birth: 03/21/1961  Transition of Care [facility]) CM/SW [place], [place] Phone Number: 12/10/2022, 8:47 AM  Clinical Narrative:     CSW spoke with Whitney in intake for [place] and [place] . Whitney informed CSW she will review referral and give CSW a call back. CSW will continue to follow and assist with patients dc planning needs.  Update- CSW received voicemail from patients spouse requesting for someone else to reach out to her from financial counseling regarding medicaid screening. Patient spouse informed CSW she has not heard from [place] and request for someone else from financial counseling to reach out to her. CSW LVM for Shanon Ross[place] as well as emailed her to see if someone else from financial counseling can give her a call. Expected Discharge Plan:  (TBD)    Expected Discharge Plan and Services In-house Referral: Clinical Social Work Discharge Planning Services: CM Consult Post Acute Care Choice: NA Living arrangements for the past 2 months: [place]                   DME Agency: NA                   Social Determinants of Health (SDOH) Interventions [place]: No Food Insecurity (10/04/2022)  Housing: Low Risk  (10/04/2022)  Transportation Needs: No Transportation Needs (10/04/2022)  Utilities: Not At Risk (10/04/2022)  Tobacco Use: High Risk (11/02/2022)    Readmission Risk Interventions     No data to display

## 2022-12-10 NOTE — Progress Notes (Signed)
Patient's wife, [name] called and informed about transfer to [place]. I initially gave the wrong room number to wife. I attempted to update her with the right room number. Unable to reach so, I left a message for her to give me a call back.

## 2022-12-11 DIAGNOSIS — J151 Pneumonia due to Pseudomonas: Secondary | ICD-10-CM

## 2022-12-11 DIAGNOSIS — J9601 Acute respiratory failure with hypoxia: Secondary | ICD-10-CM | POA: Diagnosis not present

## 2022-12-11 DIAGNOSIS — Z930 Tracheostomy status: Secondary | ICD-10-CM | POA: Diagnosis not present

## 2022-12-11 LAB — COMPREHENSIVE METABOLIC PANEL
ALT: 33 U/L (ref 0–44)
AST: 24 U/L (ref 15–41)
Albumin: 2.3 g/dL — ABNORMAL LOW (ref 3.5–5.0)
Alkaline Phosphatase: 75 U/L (ref 38–126)
Anion gap: 14 (ref 5–15)
BUN: 42 mg/dL — ABNORMAL HIGH (ref 6–20)
CO2: 21 mmol/L — ABNORMAL LOW (ref 22–32)
Calcium: 9.1 mg/dL (ref 8.9–10.3)
Chloride: 102 mmol/L (ref 98–111)
Creatinine, Ser: 0.61 mg/dL (ref 0.61–1.24)
GFR, Estimated: >60 mL/min (ref >60)
Glucose, Bld: 140 mg/dL — ABNORMAL HIGH (ref 70–99)
Potassium: 4 mmol/L (ref 3.5–5.1)
Sodium: 137 mmol/L (ref 135–145)
Total Bilirubin: 0.4 mg/dL (ref 0.3–1.2)
Total Protein: 6.9 g/dL (ref 6.5–8.1)

## 2022-12-11 LAB — CBC WITH DIFFERENTIAL/PLATELET
Abs Immature Granulocytes: 0 10*3/uL (ref 0.00–0.07)
Basophils Absolute: 0 10*3/uL (ref 0.0–0.1)
Basophils Relative: 0 %
Eosinophils Absolute: 0.3 10*3/uL (ref 0.0–0.5)
Eosinophils Relative: 3 %
HCT: 36.3 % — ABNORMAL LOW (ref 39.0–52.0)
Hemoglobin: 11.4 g/dL — ABNORMAL LOW (ref 13.0–17.0)
Lymphocytes Relative: 24 %
Lymphs Abs: 2.6 10*3/uL (ref 0.7–4.0)
MCH: 28.6 pg (ref 26.0–34.0)
MCHC: 31.4 g/dL (ref 30.0–36.0)
MCV: 91 fL (ref 80.0–100.0)
Monocytes Absolute: 1.2 10*3/uL — ABNORMAL HIGH (ref 0.1–1.0)
Monocytes Relative: 11 %
Neutro Abs: 6.8 10*3/uL (ref 1.7–7.7)
Neutrophils Relative %: 62 %
Platelets: 278 10*3/uL (ref 150–400)
RBC: 3.99 MIL/uL — ABNORMAL LOW (ref 4.22–5.81)
RDW: 15.8 % — ABNORMAL HIGH (ref 11.5–15.5)
WBC: 11 10*3/uL — ABNORMAL HIGH (ref 4.0–10.5)
nRBC: 0.5 % — ABNORMAL HIGH (ref 0.0–0.2)
nRBC: 1 /100 WBC — ABNORMAL HIGH

## 2022-12-11 LAB — GLUCOSE, CAPILLARY
Glucose-Capillary: 109 mg/dL — ABNORMAL HIGH (ref 70–99)
Glucose-Capillary: 114 mg/dL — ABNORMAL HIGH (ref 70–99)
Glucose-Capillary: 126 mg/dL — ABNORMAL HIGH (ref 70–99)
Glucose-Capillary: 127 mg/dL — ABNORMAL HIGH (ref 70–99)
Glucose-Capillary: 141 mg/dL — ABNORMAL HIGH (ref 70–99)
Glucose-Capillary: 111 mg/dL — ABNORMAL HIGH (ref 70–99)

## 2022-12-11 LAB — PHOSPHORUS: Phosphorus: 4.6 mg/dL (ref 2.5–4.6)

## 2022-12-11 LAB — MAGNESIUM: Magnesium: 2.2 mg/dL (ref 1.7–2.4)

## 2022-12-11 MED ORDER — CLONAZEPAM 0.25 MG PO TBDP
0.5000 mg | ORAL_TABLET | Freq: Two times a day (BID) | ORAL | Status: DC
  Administered 2022-12-11 – 2022-12-13 (×4): 0.5 mg via ORAL
  Filled 2022-12-11 (×4): qty 2

## 2022-12-11 MED ORDER — LEVOFLOXACIN IN D5W 750 MG/150ML IV SOLN
750.0000 mg | INTRAVENOUS | Status: AC
  Administered 2022-12-11 – 2022-12-13 (×3): 750 mg via INTRAVENOUS
  Filled 2022-12-11 (×3): qty 150

## 2022-12-11 NOTE — TOC Progression Note (Signed)
Transition of Care [facility]) - Progression Note    Patient Details  Name: Damon [name] MRN: [id number] Date of Birth: 1960/11/25  Transition of Care [facility]) CM/SW [place], [place] Phone Number: 12/11/2022, 3:53 PM  Clinical Narrative:     No current trach/SNF bed offers. Per RN patient required suctioning every 1-2 hrs overnight.  Most facilities will not accept if patient is requiring suctioning more frequently than every 4 hours. CSW will continue to assist with placement. Will refax referral closer to patient being medically ready.  Expected Discharge Plan:  (TBD)    Expected Discharge Plan and Services In-house Referral: Clinical Social Work Discharge Planning Services: CM Consult Post Acute Care Choice: NA Living arrangements for the past 2 months: [place]                   DME Agency: NA                   Social Determinants of Health (SDOH) Interventions [place]: No Food Insecurity (10/04/2022)  Housing: Low Risk  (10/04/2022)  Transportation Needs: No Transportation Needs (10/04/2022)  Utilities: Not At Risk (10/04/2022)  Tobacco Use: High Risk (11/02/2022)    Readmission Risk Interventions     No data to display

## 2022-12-11 NOTE — Progress Notes (Signed)
NAME:  Damon [name], MRN:  [id number], DOB:  09/15/1961, LOS: 104 ADMISSION DATE:  10/03/2022, CONSULTATION DATE:  10/03/2022 REFERRING MD:  Marylyn Ishihara - TRH CHIEF COMPLAINT:  Dyspnea   History of Present Illness:  62 year old man admitted to [facility] 12/20 in the setting of acute hypoxemic respiratory failure due to Flu A. PMHx significant for HTN, HLD, chronic back pain, tobacco abuse   Placed on BIPAP.  Coded (likely in the setting of hypoxia), initial rhythm PEA with CPR x 5 minutes, required intubation.  After several days on mechanical ventilation remains comatose, MRI Brain 12/26 worrisome for anoxic brain injury.  Transferred to [facility] for LTM EEG monitoring.  Neurology consulted. LTM with generalized epileptogenicity. Treated with Propofol, Depakote, Keppra, Ketamine. 12/31 Neurology with goals of care with family. Relayed that patient has a grim neurological recovery due to irreversible anoxic injury. Family wished to continue aggressive measures.   Palliative care consulted. Stay complicated with neuro-storming.   Surgical consult regarding PEG/trach  Pertinent Medical History:   Past Medical History:  Diagnosis Date   Elevated lipids    Hypertension    Tobacco abuse    Significant Hospital Events: Including procedures, antibiotic start and stop dates in addition to other pertinent events   12/18 Flu A positive 12/20 Admitted, 5 minute code after non-compliance with BiPAP. MRSA PCR - neg, Urine strep > POS 12/21 Intubated, paralyzed, ARDS protocol, on 7cc/kg 12/22 Weaned to 6 cc/kg with dyssynchrony, back on 7 cc/kg, driving pressures good, weaning vent 12/23 Attempt to wean sedation again with dyssynchrony and desaturation.  Some concern for cuff leak, tube exchange.  Cuff did not appear to be blown.  Ongoing signs of cuff leak, query leaking around cough, possible large trachea, diuresing 12/25 tolerating PSV, myoclonus> ceribell, Neuro consult, changed to propofol, diuresed  12/26  Changed to DNR. On Propofol, versed.  Tolerating PSV.  MRI brain with hypoxic/anoxic injury. Blood culture neg 12/27 GPD's on EEG, pending transfer to [facility] for cEEG. NEURO CONSULT 12/28 moved to [facility] for LTM EEG 12/31 family updated by neuro: no chance for meaningful recovery, they should make plans for comfort measures 1/1 cultures - resp normal flora 1/2 eeg still with epileptogenicity. Palli fam mtg -- family does not want to hear from medical teams unless there is "good news"  1/3  cEEG with burst suppression w highly epileptiform bursts.  1/4 cEEG with burst suppression  + highly epileptiform bursts still.  WBC slightly up to 22.5. Temp high 99 low 100  c-EEG stopped due to lack of benefit Pallative care consult - wife upset about early dc from ER on 12/18 Chaplain consult: Wife is sufering from anticipatory grief + accepting god's way + curious/anger about events leading upto current state EEG: generalized epileptogenicity with high potential for seizures as well as profound diffuse encephalopathy. In the setting of cardiac arrest, this EEG pattern is suggestive of anoxic/hypoxic brain injury.  1/5 Low grade fever +. [place] 22.5K.  on Zosyn. On vent fio2 30%. TF +. Per RN - diprivan stopped yesterday. No myoclonus today. Mild tachycardia + Rpt pall care on 10/21/22 per wife 1/6 On vent FiO2 30%.  Tmax 99,57F ., WBC better. Still off diprivan -> No myoclonus. cXR visualized - devices in place and faiure clear. Oral oxy and klonpin stopped 1/8 No significant changes in neuro status. Tachycardic to 130s, hypertensive to 140s. Coreg transitioned to metoprolol. 1/11 wife considering trach/peg, to discuss with surgery-discussed with Dr. Bobbye Morton 1/12 more tachycardic 1/16-spoke with  wife at length at bedside, Dr. Hulen Skains also had a long conversation with spouse-desires to continue current aggressive care and desires PEG and trach placed 1/18 perc trach and peg 11/05/2022 transferred to floor due to need for  ICU bed 11/08/2022 transition to [facility] with pulmonary following weekly for trach 2/5 ATC 28%, no acute issues with trach  2/26 Copious tracheal sections in the setting of pseudomonas pneumonia resulting in need to move to ICU for close   Interim History / Subjective:  Per RN required suctioning every 1-2 hrs overnight. Afebrile overnight. Remains on trach collar on stable oxygen requirements.   Objective:  Blood pressure 106/83, pulse (!) 104, temperature 98.2 F (36.8 C), temperature source Axillary, resp. rate (!) 28, height '5\' 4"'$  (1.626 m), weight 55.3 kg, SpO2 93 %.    FiO2 (%):  [21 %] 21 %   Intake/Output Summary (Last 24 hours) at 12/11/2022 0802 Last data filed at 12/11/2022 0758 Gross per 24 hour  Intake 2464.84 ml  Output 3470 ml  Net -1005.16 ml   BP 106/83   Pulse (!) 104   Temp 98.2 F (36.8 C) (Axillary)   Resp (!) 28   Ht '5\' 4"'$  (1.626 m)   Wt 55.3 kg   SpO2 93%   BMI 20.94 kg/m   Filed Weights   12/07/22 0405 12/09/22 0417 12/10/22 1309  Weight: 58.5 kg 59 kg 55.3 kg   Physical Exam: General: chronically ill appearing man lying in bed in NAD HEENT:temporal wasting Neuro: Eyes closed, not responding to verbal stimulation.  Minimal wincing with trap squeeze. No response to nailbed pressure. CV: S1S2, RRR  PULM:  rhonchi bilaterall, think pink/ white secretions from trach  GI: soft, NT Extremities: no c/c/e Skin: warm, dry, no rashes  BUN 42 Cr 0.61 WBC 11 H/H 11.4/36.3 Platelets 278  CXR 2/25 personally reviewed> medial RLL opacity, otherwise clear with a well positioned tracheostomy tube.  2/25 trach aspirate: mod WBC, rare GNR>   Resolved   Flu A AKI CAP, pneumococcal pneumonia - s/p Zosyn 12/31 - 1/7; Ceftriaxone 12/20- 12/27, Azithro 12/20, Flagyl 12/20 - 12/22  Assessment & Plan:   Anoxic brain injury, ventilator dependent s/p perc trach 1/18 Acute respiratory failure with hypoxia/ARDS secondary to flu and strep  (completed rx) Seizures Paroxysmal sympathetic hyperactivity Fever Leukocytosis  Anemia Hyperkalemia Tachycardia/hypotension due to paroxysmal sympathetic hyperactivity Severe protein calorie malnutrition Hyperglycemia Pseudomonas pneumonia RLL  Discussion Tracheostomy still required for frequent suctioning. With poor mental status, he is unlikely to be a candidate for decannulation any time soon.     Plan: Con't ICU care for frequent RT and suctioning. VEST CPT Escalating antibiotics- adding levofloxacin to zosyn due to concern for resistance developing Repeat trach aspirate culture today Trach care per protocol Overall prognosis guarded due to resistant infections and high risk for recurrence with ongoing need for tracheostomy and inability to control oral secretions.  Limit any potentially sedating medications. No use of prescribed fentanyl (had been ordered with vent order set) recently, will d/c for now. Defer to primary if clonazepam is required.  PCCM will continue to follow intermittently for trach.     Julian Hy, DO 12/11/22 8:53 AM [place] [facility]  For contact information, see Amion. If no response to pager, please call PCCM consult pager. After hours, 7PM- 7AM, please call Elink.

## 2022-12-11 NOTE — Progress Notes (Addendum)
[facility] PROGRESS NOTE  Ovidio Preusser (DOB: [birth year]) [id number] PCP: [facility]  Brief Narrative: Cordera Borsellino is Porcha Deblanc 62 y.o. male with Cadince Hilscher history of HTN, HLD, tobacco use, chronic back pain who presented to [facility] on 10/03/2022 with influenza Sharlett Lienemann. He's had Odile Veloso protracted hospitalization summarized below.  Significant Events 12/18 Flu Mihail Prettyman positive 12/20 Admitted, bipap, 5 minute code MRSA PCR - neg Urine strep _POS 12/21 Intubated, paralyzed, ARDS protocol, on 7cc/kg 12/22 Weaned to 6 cc/kg with dyssynchrony, back on 7 cc/kg, driving pressures good, weaning vent 12/23 Attempt to wean sedation again with dyssynchrony and desaturation.  Some concern for cuff leak, tube exchange.  Cuff did not appear to be blown.  Ongoing signs of cuff leak, query leaking around cough, possible large trachea, diuresing 12/25 tolerating PSV, myoclonus> ceribell, Neuro consult, changed to propofol, diuresed  12/26 Changed to DNR. On Propofol, versed.  Tolerating PSV.  MRI brain with hypoxic/anoxic injury Blood culture neg 12/27 GPD's on EEG, pending transfer to [facility] for cEEG NEURO CONSULT 12/28 moved to [facility] for LTM EEG 12/31 family updated by neuro: no chance for meaningful recovery, they should make plans for comfort measures 1/1 cultures - resp normal flora 1/2 eeg still with epileptogenicity. Palli fam mtg -- family does not want to hear from medical teams unless there is "good news"  1/3  cEEG with burst suppression w highly epileptiform bursts.  1/4 cEEG with burst suppression  + highly epileptiform bursts still.  WBC slightly up to 22.5. Temp high 99 low 100  c-EEG stopped duie to lack of benefit Pallative care consult - wife upset about early dc from ER on 12/18 Chaplain consult: Wife is sufering from anticipatory grief + accepting god's way + curious/anger about events leading upto current state EEG: generalized epileptogenicity with high potential for seizures as well as  profound diffuse encephalopathy. In the setting of cardiac arrest, this EEG pattern is suggestive of anoxic/hypoxic brain injury.  1/5 - Low grade fever +. [place] 22.5K.  on Zosyn. On vent fio2 30%. TF +. Per RN - diprivan stopped yesterday. No myoclonus today. Mild tachycardia + Rpt pall care on 10/21/22 per wife 1/6 - On vent FiO2 30%.  Tmax 99,45F ., WBC better. Still off diprivan -> No myoclonus. cXR visualized - devices in place and faiure clear Oral oxy and klonpin stopped 1/8 No significant changes in neuro status. Tachycardic to 130s, hypertensive to 140s. Coreg transitioned to metoprolol. 1/11 wife considering trach/peg, to discuss with surgery-discussed with Dr. Bobbye Morton 1/12 more tachycardic 1/16-spoke with wife at length at bedside, Dr. Hulen Skains also had [place] long conversation with spouse-desires to continue current aggressive care and desires PEG and trach placed 1/18 perc trach and peg 11/05/2022 transferred to floor due to need for ICU bed 11/08/2022 transition to [facility] with pulmonary following weekly for trach 2/4 recurrent fever, cultures collected. 2/5 changed to cuffless trach 2/18 antibiotics started for Pseudomonas on tracheal aspirate cultre 2/22 concern for myoclonic seizure, neuro reconsulted 2/26 transferred to ICU for secretions  Subjective: Discussed with wife  Objective: BP 105/86   Pulse (!) 118   Temp 98.6 F (37 C) (Axillary)   Resp (!) 29   Ht '5\' 4"'$  (1.626 m)   Wt 61.2 kg   SpO2 100%   BMI 23.17 kg/m    General: comatose Cardiovascular: tachycardic Lungs: coarse breath sounds Abdomen: Soft, nontender, nondistended  Neurological: comatose - eyes closed more the past few days, no meaningful movements Extremities:  No clubbing or cyanosis. No edema.  Assessment & Plan:  Goals of Care: IPAL from 1/24 reviewed. He had been DNR, but transitioned back to full code. - Remains full code per extensive discussions with patient's spouse.  - We will  continue all aggressive medical work up/interventions as indicated.   Anoxic brain injury Myoclonic Seizure activity  Neurology discussed with family on 12/31. 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  Signed off 12/31. EEG 2/22 consistent with anoxic/hypoxic brain injury.  EEG changes c/w myoclonic seizure.  Neurology to evaluate, appreciate assistance (2/24 last note).  - leg movements on 2/23 did not correlate with seizure activity - Continue keppra, depakene - added clonazepam BID (if causing sedation, can discontinue per neurology 2/24 note) --- will d/c klonopin in half and see how he does with reduced dose - LTM d/c'd by neurology on 2/24   Acute hypoxic respiratory failure, ARDS due to influenza Kabao Leite (+12/18) and superimposed Pneumococcal pneumonia: - s/p tamiflu 12/21-12/25 and ceftriaxone 12/20 - 12/26. Then had zosyn x7 days starting 12/31 per report. Tolerated this well.  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 Appreciate PCCM involvement for management. Site appears stable, oxygenation stable. - Having frequent suctioning of white secretions - repeat cxr (increasing right mid lower lung zone opacity, atelectasis vs pneumonia)  - being transferred to ICU to better manage secretions, appreciate pulm assistance  PEA arrest:  - Continue cardiac monitoring, electrolyte monitoring.    Recurrent Fevers  Leukocytosis: Intermittent fevers throughout hospital stay. Again febrile 2/4. No change to exam of wound or lungs, no change to oxygenation. Last true fever 1/25 while on cefepime (1/22 - 1/28). Respiratory cultures 1/1, 1/9, 1/13, 1/21 with normal flora. Blood cultures 12/26, 1/13, 1/21, 1/29, 2/4 no growth. Had coag negative staph at presentation, suspect that was contaminant. See below  Pseudomonas pneumonia vs. tracheitis:  - Susceptibility testing completed,  resistant to imipenem. Continue zosyn, confirmed to be sensitive.  Levoquin added by pulm.  Mild leukocytosis.  Tachycardia, tachypnea. - culture 2/17 pseudomonas resistant to imipenem - repeat cultures pending 2/25 with pseudomonas, pending susceptibilities - repeat cultures from 2/27 pending     Constipation: Abd benign - laxatives on hold  Elevated LFT's: Mild, stable. No cholestatic elevations. - Monitor in intermittently.    Paroxysmal sympathetic hyperactivity: Causing sinus tachycardia, hypotension.  - Continue propranolol at '40mg'$  q8h dose and neurontin.  - continue metoprolol prn for tachycardia   Hypotension - has been intermittent in the past, will monitoring - holding parameters for propanolol     Severe protein calorie malnutrition:  - Continue tube feeds thru PEG placed 11/01/2022. At goal rate and tolerating.    Prediabetes: HbA1c 5.9% on 12/20.  - SSI q4h   Hypernatremia: Mild, resolved with increased free water.   - Continue free water 259m q4h.    Diarrhea - will monitor for now, holding laxatives - rectal tube placed within past day - if persistent, consider further w/u  - seems improved  Pressure Ulcers Pressure Injury 10/12/22 Sacrum Mid;Upper Stage 2 -  Partial thickness loss of dermis presenting as Watson Robarge shallow open injury with Bibi Economos red, pink wound bed without slough. (Active)  10/12/22 0200  Location: Sacrum  Location Orientation: Mid;Upper  Staging: Stage 2 -  Partial thickness loss of dermis presenting as Qunicy Higinbotham shallow open injury with Kaelyn Nauta red, pink wound bed without slough.  Wound Description (Comments):   Present on  Admission: Yes   Fayrene Helper, MD [facility] [email or web address] 12/11/2022, 5:45 PM

## 2022-12-12 DIAGNOSIS — A498 Other bacterial infections of unspecified site: Secondary | ICD-10-CM

## 2022-12-12 DIAGNOSIS — J9601 Acute respiratory failure with hypoxia: Secondary | ICD-10-CM | POA: Diagnosis not present

## 2022-12-12 DIAGNOSIS — Z930 Tracheostomy status: Secondary | ICD-10-CM

## 2022-12-12 DIAGNOSIS — R000 Tachycardia, unspecified: Secondary | ICD-10-CM

## 2022-12-12 DIAGNOSIS — J151 Pneumonia due to Pseudomonas: Secondary | ICD-10-CM

## 2022-12-12 DIAGNOSIS — G931 Anoxic brain damage, not elsewhere classified: Secondary | ICD-10-CM | POA: Diagnosis not present

## 2022-12-12 LAB — CBC WITH DIFFERENTIAL/PLATELET
Abs Immature Granulocytes: 2.1 10*3/uL — ABNORMAL HIGH (ref 0.00–0.07)
Basophils Absolute: 0.1 10*3/uL (ref 0.0–0.1)
Basophils Relative: 0 %
Eosinophils Absolute: 0.3 10*3/uL (ref 0.0–0.5)
Eosinophils Relative: 3 %
HCT: 35.1 % — ABNORMAL LOW (ref 39.0–52.0)
Hemoglobin: 11.4 g/dL — ABNORMAL LOW (ref 13.0–17.0)
Immature Granulocytes: 16 %
Lymphocytes Relative: 14 %
Lymphs Abs: 1.9 10*3/uL (ref 0.7–4.0)
MCH: 28.5 pg (ref 26.0–34.0)
MCHC: 32.5 g/dL (ref 30.0–36.0)
MCV: 87.8 fL (ref 80.0–100.0)
Monocytes Absolute: 1.4 10*3/uL — ABNORMAL HIGH (ref 0.1–1.0)
Monocytes Relative: 11 %
Neutro Abs: 7.1 10*3/uL (ref 1.7–7.7)
Neutrophils Relative %: 56 %
Platelets: 304 10*3/uL (ref 150–400)
RBC: 4 MIL/uL — ABNORMAL LOW (ref 4.22–5.81)
RDW: 15.5 % (ref 11.5–15.5)
WBC: 12.8 10*3/uL — ABNORMAL HIGH (ref 4.0–10.5)
nRBC: 0 % (ref 0.0–0.2)

## 2022-12-12 LAB — CULTURE, RESPIRATORY W GRAM STAIN

## 2022-12-12 LAB — GLUCOSE, CAPILLARY
Glucose-Capillary: 148 mg/dL — ABNORMAL HIGH (ref 70–99)
Glucose-Capillary: 130 mg/dL — ABNORMAL HIGH (ref 70–99)
Glucose-Capillary: 110 mg/dL — ABNORMAL HIGH (ref 70–99)
Glucose-Capillary: 101 mg/dL — ABNORMAL HIGH (ref 70–99)
Glucose-Capillary: 122 mg/dL — ABNORMAL HIGH (ref 70–99)

## 2022-12-12 LAB — COMPREHENSIVE METABOLIC PANEL
ALT: 32 U/L (ref 0–44)
AST: 31 U/L (ref 15–41)
Albumin: 2.2 g/dL — ABNORMAL LOW (ref 3.5–5.0)
Alkaline Phosphatase: 81 U/L (ref 38–126)
Anion gap: 11 (ref 5–15)
BUN: 35 mg/dL — ABNORMAL HIGH (ref 6–20)
CO2: 23 mmol/L (ref 22–32)
Calcium: 9.1 mg/dL (ref 8.9–10.3)
Chloride: 100 mmol/L (ref 98–111)
Creatinine, Ser: 0.61 mg/dL (ref 0.61–1.24)
GFR, Estimated: >60 mL/min (ref >60)
Glucose, Bld: 146 mg/dL — ABNORMAL HIGH (ref 70–99)
Potassium: 4.4 mmol/L (ref 3.5–5.1)
Sodium: 134 mmol/L — ABNORMAL LOW (ref 135–145)
Total Bilirubin: 0.5 mg/dL (ref 0.3–1.2)
Total Protein: 7 g/dL (ref 6.5–8.1)

## 2022-12-12 LAB — PHOSPHORUS: Phosphorus: 3.8 mg/dL (ref 2.5–4.6)

## 2022-12-12 LAB — MAGNESIUM: Magnesium: 2.2 mg/dL (ref 1.7–2.4)

## 2022-12-12 MED ORDER — FLUCONAZOLE 40 MG/ML PO SUSR: 100.0000 mg | Freq: Every day | ORAL | Status: DC

## 2022-12-12 MED ORDER — FLUCONAZOLE 100 MG PO TABS
100.0000 mg | ORAL_TABLET | Freq: Every day | ORAL | Status: AC
  Administered 2022-12-12 – 2022-12-18 (×7): 100 mg
  Filled 2022-12-12 (×7): qty 1

## 2022-12-12 NOTE — Progress Notes (Signed)
[place] Progress Note Patient Name: Damon [name] DOB: 1960-11-03 MRN: [id number]   Date of Service  12/12/2022  HPI/Events of Note  Patient with oropharyngeal candidiasis.  eICU Interventions  Diflucan 100 mg daily x 7 days ordered.        Frederik Pear 12/12/2022, 8:38 PM

## 2022-12-12 NOTE — Progress Notes (Signed)
[facility] PROGRESS NOTE   Mang Tailor [id number] DOB: 1961/03/06 DOA: 10/03/2022  PCP: [facility]  Brief History/Interval Summary: Damon [name] is a 62 y.o. male with a history of HTN, HLD, tobacco use, chronic back pain who presented to the [facility] on 10/03/2022 with influenza A. He's had a protracted hospitalization summarized below.   Significant Events 12/18 Flu A positive 12/20 Admitted, bipap, 5 minute code MRSA PCR - neg Urine strep _POS 12/21 Intubated, paralyzed, ARDS protocol, on 7cc/kg 12/22 Weaned to 6 cc/kg with dyssynchrony, back on 7 cc/kg, driving pressures good, weaning vent 12/23 Attempt to wean sedation again with dyssynchrony and desaturation.  Some concern for cuff leak, tube exchange.  Cuff did not appear to be blown.  Ongoing signs of cuff leak, query leaking around cough, possible large trachea, diuresing 12/25 tolerating PSV, myoclonus> ceribell, Neuro consult, changed to propofol, diuresed  12/26 Changed to DNR. On Propofol, versed.  Tolerating PSV.  MRI brain with hypoxic/anoxic injury Blood culture neg 12/27 GPD's on EEG, pending transfer to [facility] for cEEG NEURO CONSULT 12/28 moved to [facility] for LTM EEG 12/31 family updated by neuro: no chance for meaningful recovery, they should make plans for comfort measures 1/1 cultures - resp normal flora 1/2 eeg still with epileptogenicity. Palli fam mtg -- family does not want to hear from medical teams unless there is "good news"  1/3  cEEG with burst suppression w highly epileptiform bursts.  1/4 cEEG with burst suppression  + highly epileptiform bursts still.  WBC slightly up to 22.5. Temp high 99 low 100  c-EEG stopped duie to lack of benefit Pallative care consult - wife upset about early dc from ER on 12/18 Chaplain consult: Wife is sufering from anticipatory grief + accepting god's way + curious/anger about events leading upto current state EEG: generalized epileptogenicity with high  potential for seizures as well as profound diffuse encephalopathy. In the setting of cardiac arrest, this EEG pattern is suggestive of anoxic/hypoxic brain injury.  1/5 - Low grade fever +. [place] 22.5K.  on Zosyn. On vent fio2 30%. TF +. Per RN - diprivan stopped yesterday. No myoclonus today. Mild tachycardia + Rpt pall care on 10/21/22 per wife 1/6 - On vent FiO2 30%.  Tmax 99,71F ., WBC better. Still off diprivan -> No myoclonus. cXR visualized - devices in place and faiure clear Oral oxy and klonpin stopped 1/8 No significant changes in neuro status. Tachycardic to 130s, hypertensive to 140s. Coreg transitioned to metoprolol. 1/11 wife considering trach/peg, to discuss with surgery-discussed with Dr. Bobbye Morton 1/12 more tachycardic 1/16-spoke with wife at length at bedside, Dr. Hulen Skains also had a long conversation with spouse-desires to continue current aggressive care and desires PEG and trach placed 1/18 perc trach and peg 11/05/2022 transferred to floor due to need for ICU bed 11/08/2022 transition to [facility] with pulmonary following weekly for trach 2/4 recurrent fever, cultures collected. 2/5 changed to cuffless trach 2/18 antibiotics started for Pseudomonas on tracheal aspirate cultre 2/22 concern for myoclonic seizure, neuro reconsulted 2/26 transferred to ICU for secretions    Subjective/Interval History: Patient not very responsive as is his usual baseline.  No family at bedside.    Assessment/Plan:  Goals of Care IPAL from 1/24 reviewed. He had been DNR, but transitioned back to full code. - Remains full code per extensive discussions with patient's spouse by different providers.  - We will continue all aggressive medical work up/interventions as indicated.    Anoxic brain  injury Myoclonic Seizure activity  Neurology discussed with family on 12/31. "Has sustained irreversible anoxic brain injury that is catastrophic." Family was encouraged to consider comfort  measures.  Signed off 12/31. EEG 2/22 consistent with anoxic/hypoxic brain injury.  EEG changes c/w myoclonic seizure. Neurology to evaluate, appreciate assistance (2/24 last note).  - leg movements on 2/23 did not correlate with seizure activity - Continue keppra, depakene - added clonazepam BID (if causing sedation, can discontinue per neurology 2/24 note).  Dose of Klonopin was decreased on 2/27.  Remains sedated.  May need to further titrate her down.  Will decide in 1 to 2 days.   - LTM d/c'd by neurology on 2/24   Acute hypoxic respiratory failure, ARDS due to influenza A (+12/18) and superimposed Pneumococcal pneumonia: - s/p tamiflu 12/21-12/25 and ceftriaxone 12/20 - 12/26. Then had zosyn x7 days starting 12/31 per report. Tolerated this well.  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 Appreciate PCCM involvement for management. Site appears stable, oxygenation stable. - Having frequent suctioning of white secretions - repeat cxr (increasing right mid lower lung zone opacity, atelectasis vs pneumonia)  - transferred to ICU to better manage secretions, appreciate pulm assistance. Respiratory status seems to be stable.   Pseudomonas pneumonia vs. tracheitis:  - Susceptibility testing completed, resistant to imipenem. Continue zosyn, confirmed to be sensitive.  Levoquin added by pulm due to concern for resistance.  Mild leukocytosis.  Tachycardia, tachypnea. - culture 2/17 pseudomonas resistant to imipenem - repeat cultures pending 2/25 with pseudomonas, pending susceptibilities - repeat cultures from 2/27 pending     PEA arrest:  - Continue cardiac monitoring, electrolyte monitoring.    Recurrent Fevers  Leukocytosis Intermittent fevers throughout hospital stay.  Has received multiple antibiotic courses.   See discussions above.   Constipation Laxatives on hold due to frequent BMs.   Elevated LFT's LFTs were noted to be normal as of  12/12/2022.   Paroxysmal sympathetic hyperactivity Causing sinus tachycardia, hypotension.  - Continue propranolol at '40mg'$  q8h dose and neurontin.  - continue metoprolol prn for tachycardia   Hypotension - has been intermittent in the past, will monitoring - holding parameters for propanolol     Severe protein calorie malnutrition:  - Continue tube feeds thru PEG placed 11/01/2022. At goal rate and tolerating.    Prediabetes HbA1c 5.9% on 12/20.  - SSI q4h    Hypernatremia Mild, resolved with increased free water.   - Continue free water 260m q4h.    Diarrhea - will monitor for now, holding laxatives Seems to have slowed down.   Pressure Ulcers Pressure Injury 10/12/22 Sacrum Mid;Upper Stage 2 -  Partial thickness loss of dermis presenting as a shallow open injury with a red, pink wound bed without slough. (Active)  10/12/22 0200  Location: Sacrum  Location Orientation: Mid;Upper  Staging: Stage 2 -  Partial thickness loss of dermis presenting as a shallow open injury with a red, pink wound bed without slough.  Wound Description (Comments):   Present on Admission: Yes    DVT Prophylaxis: Lovenox Code Status: Full code Family Communication: No family at bedside Disposition Plan: To be determined  Status is: Inpatient Remains inpatient appropriate because: Acute respiratory failure, anoxic brain injury, seizure activities      Medications: Scheduled:  Chlorhexidine Gluconate Cloth  6 each Topical Daily   clonazepam  0.5 mg Oral BID   enoxaparin (LOVENOX) injection  40 mg Subcutaneous Q24H   famotidine  20 mg Per Tube  QHS   feeding supplement (PROSource TF20)  60 mL Per Tube BID   free water  200 mL Per Tube Q4H   gabapentin  100 mg Per Tube Q8H   guaiFENesin  400 mg Per Tube Q8H   leptospermum manuka honey  1 Application Topical QHS   levETIRAcetam  1,500 mg Per Tube BID   nutrition supplement (JUVEN)  1 packet Per Tube BID BM   mouth rinse  15 mL Mouth Rinse 4  times per day   propranolol  40 mg Per Tube Q8H   scopolamine  1 patch Transdermal Q72H   valproic acid  500 mg Per Tube Q8H   Continuous:  sodium chloride 250 mL (12/10/22 1340)   feeding supplement (OSMOLITE 1.5 CAL) 65 mL/hr at 12/12/22 0800   levofloxacin (LEVAQUIN) IV Stopped (12/11/22 1105)   piperacillin-tazobactam (ZOSYN)  IV 12.5 mL/hr at 12/12/22 0800   [id number] (TYLENOL) oral liquid 160 mg/5 mL **OR** acetaminophen, ipratropium-albuterol, metoprolol tartrate, ondansetron **OR** ondansetron (ZOFRAN) IV, mouth rinse, oxyCODONE  Antibiotics: Anti-infectives (From admission, onward)    Start     Dose/Rate Route Frequency Ordered Stop   12/11/22 0930  levofloxacin (LEVAQUIN) IVPB 750 mg        750 mg 100 mL/hr over 90 Minutes Intravenous Every 24 hours 12/11/22 0830     12/03/22 1115  piperacillin-tazobactam (ZOSYN) IVPB 3.375 g        3.375 g 12.5 mL/hr over 240 Minutes Intravenous Every 8 hours 12/03/22 1018 12/13/22 1159   12/02/22 1245  ceFEPIme (MAXIPIME) 2 g in sodium chloride 0.9 % 100 mL IVPB  Status:  Discontinued        2 g 200 mL/hr over 30 Minutes Intravenous Every 8 hours 12/02/22 1145 12/03/22 0940   11/05/22 0900  ceFEPIme (MAXIPIME) 2 g in sodium chloride 0.9 % 100 mL IVPB        2 g 200 mL/hr over 30 Minutes Intravenous Every 8 hours 11/05/22 0804 11/12/22 0050   11/01/22 1215  ceFAZolin (ANCEF) IVPB 2g/100 mL premix        2 g 200 mL/hr over 30 Minutes Intravenous  Once 11/01/22 0851 11/01/22 1232   10/14/22 1000  piperacillin-tazobactam (ZOSYN) IVPB 3.375 g        3.375 g 12.5 mL/hr over 240 Minutes Intravenous Every 8 hours 10/14/22 0951 10/21/22 0556   10/04/22 2200  cefTRIAXone (ROCEPHIN) 2 g in sodium chloride 0.9 % 100 mL IVPB  Status:  Discontinued        2 g 200 mL/hr over 30 Minutes Intravenous Daily at bedtime 10/04/22 0914 10/10/22 0900   10/04/22 1045  oseltamivir (TAMIFLU) 6 MG/ML suspension 75 mg        75 mg Per Tube 2 times daily  10/04/22 0950 10/08/22 2229   10/04/22 0600  cefTRIAXone (ROCEPHIN) 2 g in sodium chloride 0.9 % 100 mL IVPB  Status:  Discontinued        2 g 200 mL/hr over 30 Minutes Intravenous Every 24 hours 10/03/22 1054 10/03/22 1250   10/04/22 0600  azithromycin (ZITHROMAX) 500 mg in sodium chloride 0.9 % 250 mL IVPB  Status:  Discontinued        500 mg 250 mL/hr over 60 Minutes Intravenous Every 24 hours 10/03/22 1054 10/03/22 1250   10/03/22 2200  cefTRIAXone (ROCEPHIN) 1 g in sodium chloride 0.9 % 100 mL IVPB  Status:  Discontinued        1 g 200 mL/hr over 30 Minutes  Intravenous Daily at bedtime 10/03/22 1559 10/04/22 0914   10/03/22 2200  metroNIDAZOLE (FLAGYL) IVPB 500 mg  Status:  Discontinued        500 mg 100 mL/hr over 60 Minutes Intravenous Every 12 hours 10/03/22 1559 10/05/22 0812   10/03/22 1400  piperacillin-tazobactam (ZOSYN) IVPB 3.375 g  Status:  Discontinued        3.375 g 12.5 mL/hr over 240 Minutes Intravenous Every 8 hours 10/03/22 1258 10/03/22 1559   10/03/22 0515  cefTRIAXone (ROCEPHIN) 1 g in sodium chloride 0.9 % 100 mL IVPB        1 g 200 mL/hr over 30 Minutes Intravenous  Once 10/03/22 0503 10/03/22 0732   10/03/22 0515  azithromycin (ZITHROMAX) 500 mg in sodium chloride 0.9 % 250 mL IVPB        500 mg 250 mL/hr over 60 Minutes Intravenous  Once 10/03/22 0503 10/03/22 0732       Objective:  Vital Signs  Vitals:   12/12/22 0500 12/12/22 0620 12/12/22 0700 12/12/22 0800  BP: (!) 147/102 (!) 132/95 (!) 128/99 100/79  Pulse: (!) 116 (!) 105 (!) 109 (!) 113  Resp: (!) 32 (!) 29 (!) 29 (!) 21  Temp:    98.6 F (37 C)  TempSrc:    Axillary  SpO2: 95% 95% 91% 95%  Weight: 57.2 kg     Height:        Intake/Output Summary (Last 24 hours) at 12/12/2022 0847 Last data filed at 12/12/2022 0800 Gross per 24 hour  Intake 1788.23 ml  Output 2772 ml  Net -983.77 ml   Filed Weights   12/10/22 1309 12/11/22 0703 12/12/22 0500  Weight: 55.3 kg 61.2 kg 57.2 kg     General appearance: Unresponsive Tracheostomy noted Resp: Coarse breath sounds bilaterally with few crackles at the bases.  No wheezing or rhonchi Cardio: S1-S2 is normal regular.  No S3-S4.  No rubs murmurs or bruit GI: Abdomen is soft.  Nontender.  PEG tube noted. Extremities: No edema.     Lab Results:  Data Reviewed: I have personally reviewed following labs and reports of the imaging studies  CBC: Recent Labs  Lab 12/08/22 0207 12/09/22 0157 12/10/22 1430 12/11/22 0200 12/12/22 0448  WBC 9.4 9.6 10.8* 11.0* 12.8*  NEUTROABS 5.3 5.9 5.6 6.8 7.1  HGB 10.6* 11.3* 11.5* 11.4* 11.4*  HCT 33.4* 35.6* 35.7* 36.3* 35.1*  MCV 88.4 89.0 89.3 91.0 87.8  PLT 274 262 284 278 [id number]    Basic Metabolic Panel: Recent Labs  Lab 12/08/22 0207 12/09/22 0157 12/10/22 1430 12/11/22 0200 12/12/22 0448  NA 139 139 135 137 134*  K 4.1 4.1 4.3 4.0 4.4  CL 106 110 101 102 100  CO2 '24 23 23 '$ 21* 23  GLUCOSE 133* 109* 133* 140* 146*  BUN 34* 33* 37* 42* 35*  CREATININE 0.62 0.63 0.55* 0.61 0.61  CALCIUM 8.8* 8.8* 9.4 9.1 9.1  MG 2.2 2.2 2.1 2.2 2.2  PHOS 4.2 3.3 4.1 4.6 3.8    GFR: Estimated Creatinine Clearance: 79.4 mL/min (by C-G formula based on SCr of 0.61 mg/dL).  Liver Function Tests: Recent Labs  Lab 12/08/22 0207 12/09/22 0157 12/10/22 1430 12/11/22 0200 12/12/22 0448  AST '20 24 25 24 31  '$ ALT 31 30 32 33 32  ALKPHOS 79 73 74 75 81  BILITOT 0.4 0.5 0.3 0.4 0.5  PROT 6.6 6.7 6.9 6.9 7.0  ALBUMIN 2.3* 2.2* 2.3* 2.3* 2.2*     Recent Labs  Lab 12/10/22 1430  AMMONIA 38*     CBG: Recent Labs  Lab 12/11/22 1519 12/11/22 2017 12/11/22 2319 12/12/22 0410 12/12/22 0834  GLUCAP 127* 141* 111* 148* 130*     Recent Results (from the past 240 hour(s))  Culture, blood (Routine X 2) w Reflex to ID Panel     Status: None   Collection Time: 12/05/22  2:47 PM   Specimen: BLOOD RIGHT ARM  Result Value Ref Range Status   Specimen Description BLOOD RIGHT ARM   Final   Special Requests   Final    BOTTLES DRAWN AEROBIC AND ANAEROBIC Blood Culture results may not be optimal due to an excessive volume of blood received in culture bottles   Culture   Final    NO GROWTH 5 DAYS Performed at [facility], [place] [address], [place], [place] [address]    Report Status 12/10/2022 FINAL  Final  Culture, blood (Routine X 2) w Reflex to ID Panel     Status: None   Collection Time: 12/05/22  2:47 PM   Specimen: BLOOD RIGHT HAND  Result Value Ref Range Status   Specimen Description BLOOD RIGHT HAND  Final   Special Requests   Final    BOTTLES DRAWN AEROBIC AND ANAEROBIC Blood Culture results may not be optimal due to an excessive volume of blood received in culture bottles   Culture   Final    NO GROWTH 5 DAYS Performed at [facility], [place] [address], [place], [place] [address]    Report Status 12/10/2022 FINAL  Final  Culture, Respiratory w Gram Stain     Status: None (Preliminary result)   Collection Time: 12/09/22  4:45 PM   Specimen: Tracheal Aspirate; Respiratory  Result Value Ref Range Status   Specimen Description TRACHEAL ASPIRATE  Final   Special Requests NONE  Final   Gram Stain   Final    MODERATE WBC PRESENT,BOTH PMN AND MONONUCLEAR RARE GRAM NEGATIVE RODS    Culture   Final    FEW PSEUDOMONAS AERUGINOSA SUSCEPTIBILITIES TO FOLLOW Performed at [facility], [place] [address] [place], [place] [address]    Report Status PENDING  Incomplete  MRSA Next Gen by PCR, Nasal     Status: None   Collection Time: 12/09/22  5:25 PM   Specimen: Nasal Mucosa; Nasal Swab  Result Value Ref Range Status   MRSA by PCR Next Gen NOT DETECTED NOT DETECTED Final    Comment: (NOTE) The GeneXpert MRSA Assay (FDA approved for NASAL specimens only), is one component of a comprehensive MRSA colonization surveillance program. It is not intended to diagnose MRSA infection nor to guide or monitor treatment for MRSA infections. Test performance is  not FDA approved in patients less than 45 years old. Performed at [facility], [place] [address], [place], [place] [address]   Culture, Respiratory w Gram Stain     Status: None (Preliminary result)   Collection Time: 12/11/22  8:29 AM   Specimen: Tracheal Aspirate; Respiratory  Result Value Ref Range Status   Specimen Description TRACHEAL ASPIRATE  Final   Special Requests NONE  Final   Gram Stain   Final    ABUNDANT WBC PRESENT, PREDOMINANTLY PMN NO ORGANISMS SEEN Performed at [facility], [address], [place], [place] [address]    Culture PENDING  Incomplete   Report Status PENDING  Incomplete      Radiology Studies: No results found.  LOS: 64 days   Chizara Mena [employer] on [email or web address]  12/12/2022, 8:47 AM

## 2022-12-12 NOTE — TOC Progression Note (Addendum)
Transition of Care [facility]) - Progression Note    Patient Details  Name: Damon [name] MRN: [id number] Date of Birth: 01-23-1961  Transition of Care [facility]) CM/SW [place], [place] Phone Number: 12/12/2022, 11:48 AM  Clinical Narrative:     CSW received call from patients spouse. Patients spouse request CSW to reach out to [place] in financial counseling to let him know she is here at the hospital. She informed CSW that Clifton James wanted her to let him know when she is here so that he can meet to speak with her. CSW called financial counseling and LVM for Clifton James to give CSW call back. CSW also emailed Clifton James to let him know that patients spouse is here and would like to speak with him. CSW received email back from [place] with financial counseling who confirmed that he plans on meeting with patients spouse today.CSW following to refax out for possible trach/SNF placement closer to patient being medically ready. CSW will continue to follow and assist with patients dc planning needs.  Expected Discharge Plan:  (TBD)    Expected Discharge Plan and Services In-house Referral: Clinical Social Work Discharge Planning Services: CM Consult Post Acute Care Choice: NA Living arrangements for the past 2 months: [place]                   DME Agency: NA                   Social Determinants of Health (SDOH) Interventions [place]: No Food Insecurity (10/04/2022)  Housing: Low Risk  (10/04/2022)  Transportation Needs: No Transportation Needs (10/04/2022)  Utilities: Not At Risk (10/04/2022)  Tobacco Use: High Risk (11/02/2022)    Readmission Risk Interventions     No data to display

## 2022-12-12 NOTE — Progress Notes (Incomplete)
Nutrition Follow-up  DOCUMENTATION CODES:   Not applicable  INTERVENTION:  ***   NUTRITION DIAGNOSIS:   Inadequate oral intake related to inability to eat as evidenced by NPO status.  ***  GOAL:   Patient will meet greater than or equal to 90% of their needs  ***  MONITOR:   Labs, Weight trends, TF tolerance, Skin, I & O's  REASON FOR ASSESSMENT:   Consult Enteral/tube feeding initiation and management  ASSESSMENT:   Pt admitted from home with the flu leading to acute respiratory failure with hypoxia and PEA arrest upon admission. PMH significant for HTN, HLD, chronic back pain, tobacco use.  Pt remains Weight has fluctuated up and down since admission but appears to overall  Labs: Meds: ***   NUTRITION - FOCUSED PHYSICAL EXAM:  {RD Focused Exam List:21252}  Diet Order:   Diet Order     None       EDUCATION NEEDS:   No education needs have been identified at this time  Skin:  Skin Assessment: Skin Integrity Issues: Skin Integrity Issues:: Unstageable Stage II: N/A Unstageable: sacrum (8cm x 1.5 cm x 0 cm) early/partial granulation  Last BM:  12/05/22 - medium type 6  Height:   Ht Readings from Last 1 Encounters:  10/11/22 '5\' 4"'$  (1.626 m)    Weight:   Wt Readings from Last 1 Encounters:  12/12/22 57.2 kg    Ideal Body Weight:     BMI:  Body mass index is 21.63 kg/m.  Estimated Nutritional Needs:   Kcal:  2400-2600  Protein:  120-140 grams  Fluid:  >/= 2 L/day    ***

## 2022-12-12 NOTE — Progress Notes (Signed)
NAME:  Damon [name], MRN:  [id number], DOB:  03-Sep-1961, LOS: 56 ADMISSION DATE:  10/03/2022, CONSULTATION DATE:  10/03/2022 REFERRING MD:  Marylyn Ishihara - [facility] CHIEF COMPLAINT:  Dyspnea   History of Present Illness:  62 year old man admitted to [facility] 12/20 in the setting of acute hypoxemic respiratory failure due to Flu A. PMHx significant for HTN, HLD, chronic back pain, tobacco abuse   Placed on BIPAP.  Coded (likely in the setting of hypoxia), initial rhythm PEA with CPR x 5 minutes, required intubation.  After several days on mechanical ventilation remains comatose, MRI Brain 12/26 worrisome for anoxic brain injury.  Transferred to [facility] for LTM EEG monitoring.  Neurology consulted. LTM with generalized epileptogenicity. Treated with Propofol, Depakote, Keppra, Ketamine. 12/31 Neurology with goals of care with family. Relayed that patient has a grim neurological recovery due to irreversible anoxic injury. Family wished to continue aggressive measures.   Palliative care consulted. Stay complicated with neuro-storming.   Surgical consult regarding PEG/trach  Pertinent Medical History:   Past Medical History:  Diagnosis Date   Elevated lipids    Hypertension    Tobacco abuse    Significant Hospital Events: Including procedures, antibiotic start and stop dates in addition to other pertinent events   12/18 Flu A positive 12/20 Admitted, 5 minute code after non-compliance with BiPAP. MRSA PCR - neg, Urine strep > POS 12/21 Intubated, paralyzed, ARDS protocol, on 7cc/kg 12/22 Weaned to 6 cc/kg with dyssynchrony, back on 7 cc/kg, driving pressures good, weaning vent 12/23 Attempt to wean sedation again with dyssynchrony and desaturation.  Some concern for cuff leak, tube exchange.  Cuff did not appear to be blown.  Ongoing signs of cuff leak, query leaking around cough, possible large trachea, diuresing 12/25 tolerating PSV, myoclonus> ceribell, Neuro consult, changed to propofol, diuresed  12/26  Changed to DNR. On Propofol, versed.  Tolerating PSV.  MRI brain with hypoxic/anoxic injury. Blood culture neg 12/27 GPD's on EEG, pending transfer to [facility] for cEEG. NEURO CONSULT 12/28 moved to [facility] for LTM EEG 12/31 family updated by neuro: no chance for meaningful recovery, they should make plans for comfort measures 1/1 cultures - resp normal flora 1/2 eeg still with epileptogenicity. Palli fam mtg -- family does not want to hear from medical teams unless there is "good news"  1/3  cEEG with burst suppression w highly epileptiform bursts.  1/4 cEEG with burst suppression  + highly epileptiform bursts still.  WBC slightly up to 22.5. Temp high 99 low 100  c-EEG stopped due to lack of benefit Pallative care consult - wife upset about early dc from ER on 12/18 Chaplain consult: Wife is sufering from anticipatory grief + accepting god's way + curious/anger about events leading upto current state EEG: generalized epileptogenicity with high potential for seizures as well as profound diffuse encephalopathy. In the setting of cardiac arrest, this EEG pattern is suggestive of anoxic/hypoxic brain injury.  1/5 Low grade fever +. [place] 22.5K.  on Zosyn. On vent fio2 30%. TF +. Per RN - diprivan stopped yesterday. No myoclonus today. Mild tachycardia + Rpt pall care on 10/21/22 per wife 1/6 On vent FiO2 30%.  Tmax 99,27F ., WBC better. Still off diprivan -> No myoclonus. cXR visualized - devices in place and faiure clear. Oral oxy and klonpin stopped 1/8 No significant changes in neuro status. Tachycardic to 130s, hypertensive to 140s. Coreg transitioned to metoprolol. 1/11 wife considering trach/peg, to discuss with surgery-discussed with Dr. Bobbye Morton 1/12 more tachycardic 1/16-spoke with  wife at length at bedside, Dr. Hulen Skains also had a long conversation with spouse-desires to continue current aggressive care and desires PEG and trach placed 1/18 perc trach and peg 11/05/2022 transferred to floor due to need for  ICU bed 11/08/2022 transition to [facility] with pulmonary following weekly for trach 2/5 ATC 28%, no acute issues with trach  2/26 Copious tracheal sections in the setting of pseudomonas pneumonia resulting in need to move to ICU for close   Interim History / Subjective:  No acute changes overnight.  Tmax 99.3. He is unable to provide additional history. Objective:  Blood pressure 113/86, pulse (!) 116, temperature 98.6 F (37 C), temperature source Axillary, resp. rate (!) 24, height '5\' 4"'$  (1.626 m), weight 57.2 kg, SpO2 94 %.    FiO2 (%):  [21 %] 21 %   Intake/Output Summary (Last 24 hours) at 12/12/2022 1005 Last data filed at 12/12/2022 0900 Gross per 24 hour  Intake 1858.6 ml  Output 3022 ml  Net -1163.4 ml   BP 113/86   Pulse (!) 116   Temp 98.6 F (37 C) (Axillary)   Resp (!) 24   Ht '5\' 4"'$  (1.626 m)   Wt 57.2 kg   SpO2 94%   BMI 21.63 kg/m   Filed Weights   12/10/22 1309 12/11/22 0703 12/12/22 0500  Weight: 55.3 kg 61.2 kg 57.2 kg   Physical Exam: General: chronically ill appearing man lying in bed in NAD HEENT: temporal wasting, cortrak Neuro: eyes open, not responding to verbal commands  CV: S1S2, RRR PULM: bilateral rhonchi, breathing comfortably on TC, no accessory muscle use GI: soft, NT Extremities: no c/c/e. Muscle wasting.  Skin: Warm, dry, no rashes.  BUN 35 Cr 0.61 WBC 12.8 H/H 11.4/35.1 Platelets 304  Trach aspirate 2/27:abundant PMNs> pseudomonas  CXR 2/25 personally reviewed> medial RLL opacity, otherwise clear with a well positioned tracheostomy tube.  2/25 trach aspirate: mod WBC, rare GNR> Psuedomonas >R imipenem, still sensitive to zosyn  Resolved   Flu A AKI CAP, pneumococcal pneumonia - s/p Zosyn 12/31 - 1/7; Ceftriaxone 12/20- 12/27, Azithro 12/20, Flagyl 12/20 - 12/22  Assessment & Plan:   Anoxic brain injury, ventilator dependent s/p perc trach 1/18 Acute respiratory failure with hypoxia/ARDS secondary to flu  and strep (completed rx) Seizures Paroxysmal sympathetic hyperactivity Fever Leukocytosis  Anemia Hyperkalemia Tachycardia/hypotension due to paroxysmal sympathetic hyperactivity Severe protein calorie malnutrition Hyperglycemia Pseudomonas pneumonia RLL  Discussion Tracheostomy still required for frequent suctioning. With poor mental status, he is unlikely to be a candidate for decannulation any time soon.     Plan: Con't ICU care for frequent RT care, suctioning needs. Hopefully can transfer back out tomorrow.  -CPT with vest -con't escalated antibiotics to double cover pseudomonas since he is not clearing it -trach care per protocol -overall prognosis remains guarded, ancipitate he will be colonized with pseudomonas now.    PCCM will continue to follow intermittently for trach.    Julian Hy, DO 12/12/22 6:44 PM [facility]  For contact information, see Amion. If no response to pager, please call PCCM consult pager. After hours, 7PM- 7AM, please call Elink.

## 2022-12-13 DIAGNOSIS — Z930 Tracheostomy status: Secondary | ICD-10-CM

## 2022-12-13 DIAGNOSIS — J151 Pneumonia due to Pseudomonas: Secondary | ICD-10-CM

## 2022-12-13 DIAGNOSIS — J9601 Acute respiratory failure with hypoxia: Secondary | ICD-10-CM | POA: Diagnosis not present

## 2022-12-13 LAB — GLUCOSE, CAPILLARY
Glucose-Capillary: 101 mg/dL — ABNORMAL HIGH (ref 70–99)
Glucose-Capillary: 106 mg/dL — ABNORMAL HIGH (ref 70–99)
Glucose-Capillary: 107 mg/dL — ABNORMAL HIGH (ref 70–99)
Glucose-Capillary: 108 mg/dL — ABNORMAL HIGH (ref 70–99)
Glucose-Capillary: 118 mg/dL — ABNORMAL HIGH (ref 70–99)
Glucose-Capillary: 119 mg/dL — ABNORMAL HIGH (ref 70–99)
Glucose-Capillary: 124 mg/dL — ABNORMAL HIGH (ref 70–99)

## 2022-12-13 LAB — CULTURE, RESPIRATORY W GRAM STAIN

## 2022-12-13 MED ORDER — PIPERACILLIN-TAZOBACTAM 3.375 G IVPB
3.3750 g | Freq: Three times a day (TID) | INTRAVENOUS | Status: AC
  Administered 2022-12-13 – 2022-12-16 (×11): 3.375 g via INTRAVENOUS
  Filled 2022-12-13 (×11): qty 50

## 2022-12-13 MED ORDER — CLONAZEPAM 0.5 MG PO TABS
0.5000 mg | ORAL_TABLET | Freq: Once | ORAL | Status: AC
  Administered 2022-12-13: 0.5 mg via ORAL
  Filled 2022-12-13: qty 1

## 2022-12-13 MED ORDER — MIDAZOLAM HCL 2 MG/2ML IJ SOLN: 1.0000 mg | Freq: Once | INTRAMUSCULAR | Status: AC

## 2022-12-13 MED ORDER — MIDAZOLAM HCL 2 MG/2ML IJ SOLN
INTRAMUSCULAR | Status: AC
  Administered 2022-12-13: 1 mg via INTRAVENOUS
  Filled 2022-12-13: qty 2

## 2022-12-13 MED ORDER — TOBRAMYCIN 300 MG/5ML IN NEBU
300.0000 mg | INHALATION_SOLUTION | Freq: Two times a day (BID) | RESPIRATORY_TRACT | Status: DC
  Administered 2022-12-13 – 2022-12-25 (×19): 300 mg via RESPIRATORY_TRACT
  Filled 2022-12-13 (×33): qty 5

## 2022-12-13 MED ORDER — CLONAZEPAM 0.25 MG PO TBDP
1.0000 mg | ORAL_TABLET | Freq: Two times a day (BID) | ORAL | Status: DC
  Administered 2022-12-13 – 2022-12-17 (×8): 1 mg via ORAL
  Filled 2022-12-13 (×8): qty 4

## 2022-12-13 MED ORDER — CLONAZEPAM 0.25 MG PO TBDP: 1.0000 mg | ORAL_TABLET | Freq: Three times a day (TID) | ORAL | Status: DC

## 2022-12-13 MED ORDER — LEVETIRACETAM 100 MG/ML PO SOLN
500.0000 mg | ORAL | Status: AC
  Administered 2022-12-13: 500 mg
  Filled 2022-12-13: qty 5

## 2022-12-13 NOTE — Progress Notes (Signed)
NAME:  Coit Filkins, MRN:  [id number], DOB:  Jun 12, 1961, LOS: 71 ADMISSION DATE:  10/03/2022, CONSULTATION DATE:  10/03/2022 REFERRING MD:  Marylyn Ishihara - TR[facility] CHIEF COMPLAINT:  Dyspnea   History of Present Illness:  62 year old man admitted to [facility] 12/20 in the setting of acute hypoxemic respiratory failure due to Flu A. PMHx significant for HTN, HLD, chronic back pain, tobacco abuse   Placed on BIPAP.  Coded (likely in the setting of hypoxia), initial rhythm PEA with CPR x 5 minutes, required intubation.  After several days on mechanical ventilation remains comatose, MRI Brain 12/26 worrisome for anoxic brain injury.  Transferred to [facility] for LTM EEG monitoring.  Neurology consulted. LTM with generalized epileptogenicity. Treated with Propofol, Depakote, Keppra, Ketamine. 12/31 Neurology with goals of care with family. Relayed that patient has a grim neurological recovery due to irreversible anoxic injury. Family wished to continue aggressive measures.   Palliative care consulted. Stay complicated with neuro-storming.   Surgical consult regarding PEG/trach.  Pertinent Medical History:   Past Medical History:  Diagnosis Date   Elevated lipids    Hypertension    Tobacco abuse    Significant Hospital Events: Including procedures, antibiotic start and stop dates in addition to other pertinent events   12/18 Flu A positive 12/20 Admitted, 5 minute code after non-compliance with BiPAP. MRSA PCR - neg, Urine strep > POS 12/21 Intubated, paralyzed, ARDS protocol, on 7cc/kg 12/22 Weaned to 6 cc/kg with dyssynchrony, back on 7 cc/kg, driving pressures good, weaning vent 12/23 Attempt to wean sedation again with dyssynchrony and desaturation.  Some concern for cuff leak, tube exchange.  Cuff did not appear to be blown.  Ongoing signs of cuff leak, query leaking around cough, possible large trachea, diuresing 12/25 tolerating PSV, myoclonus> ceribell, Neuro consult, changed to propofol, diuresed  12/26  Changed to DNR. On Propofol, versed.  Tolerating PSV.  MRI brain with hypoxic/anoxic injury. Blood culture neg 12/27 GPD's on EEG, pending transfer to [facility] for cEEG. NEURO CONSULT 12/28 moved to [facility] for LTM EEG 12/31 family updated by neuro: no chance for meaningful recovery, they should make plans for comfort measures 1/1 cultures - resp normal flora 1/2 eeg still with epileptogenicity. Palli fam mtg -- family does not want to hear from medical teams unless there is "good news"  1/3  cEEG with burst suppression w highly epileptiform bursts.  1/4 cEEG with burst suppression  + highly epileptiform bursts still.  WBC slightly up to 22.5. Temp high 99 low 100  c-EEG stopped due to lack of benefit Pallative care consult - wife upset about early dc from ER on 12/18 Chaplain consult: Wife is sufering from anticipatory grief + accepting god's way + curious/anger about events leading upto current state EEG: generalized epileptogenicity with high potential for seizures as well as profound diffuse encephalopathy. In the setting of cardiac arrest, this EEG pattern is suggestive of anoxic/hypoxic brain injury.  1/5 Low grade fever +. [place] 22.5K.  on Zosyn. On vent fio2 30%. TF +. Per RN - diprivan stopped yesterday. No myoclonus today. Mild tachycardia + Rpt pall care on 10/21/22 per wife 1/6 On vent FiO2 30%.  Tmax 99,18F ., WBC better. Still off diprivan -> No myoclonus. cXR visualized - devices in place and faiure clear. Oral oxy and klonpin stopped 1/8 No significant changes in neuro status. Tachycardic to 130s, hypertensive to 140s. Coreg transitioned to metoprolol. 1/11 wife considering trach/peg, to discuss with surgery-discussed with Dr. Bobbye Morton 1/12 more tachycardic 1/16-spoke with  wife at length at bedside, Dr. Hulen Skains also had a long conversation with spouse-desires to continue current aggressive care and desires PEG and trach placed 1/18 perc trach and peg 11/05/2022 transferred to floor due to need for  ICU bed 11/08/2022 transition to [facility] service with pulmonary following weekly for trach 2/5 ATC 28%, no acute issues with trach  2/26 Copious tracheal sections in the setting of pseudomonas pneumonia resulting in need to move to ICU for close monitoring/ RT care.  Interim History / Subjective:  Overnight no acute changes. Remains nonresponsive. Requiring suctioning with his CPT, but not in between per RT.  Objective:  Blood pressure 101/80, pulse (!) 113, temperature 97.9 F (36.6 C), temperature source Axillary, resp. rate (!) 23, height '5\' 4"'$  (1.626 m), weight 55.3 kg, SpO2 94 %.    FiO2 (%):  [21 %] 21 %   Intake/Output Summary (Last 24 hours) at 12/13/2022 1702 Last data filed at 12/13/2022 1400 Gross per 24 hour  Intake 1925.43 ml  Output 2285 ml  Net -359.57 ml   BP 101/80   Pulse (!) 113   Temp 97.9 F (36.6 C) (Axillary)   Resp (!) 23   Ht '5\' 4"'$  (1.626 m)   Wt 55.3 kg   SpO2 94%   BMI 20.94 kg/m   Filed Weights   12/11/22 0703 12/12/22 0500 12/13/22 0529  Weight: 61.2 kg 57.2 kg 55.3 kg   Physical Exam: General: chronically ill appearing man lying in bed in NAD, nonresponsive HEENT: temporal wasting Neck: uncuffed shiley trach in place, no erythema around stoma Neuro: eyes intermittently opening, occasional posturing with stimulation.  CV: S1S2, RRR PULM: rhonchi bilaterally, less secretions coughed up during my exam GI: soft, NT Extremities: muscle wasting, no edema Skin: warm, dry, no rashes  Trach aspirates: all pseudomonas with resistance to imipenem  Resolved   Flu A AKI CAP, pneumococcal pneumonia - s/p Zosyn 12/31 - 1/7; Ceftriaxone 12/20- 12/27, Azithro 12/20, Flagyl 12/20 - 12/22  Assessment & Plan:   Anoxic brain injury, ventilator dependent s/p perc trach 1/18 Acute respiratory failure with hypoxia/ARDS secondary to flu and strep (completed rx) Seizures Paroxysmal sympathetic hyperactivity Fever Leukocytosis   Anemia Hyperkalemia Tachycardia/hypotension due to paroxysmal sympathetic hyperactivity Severe protein calorie malnutrition Hyperglycemia Pseudomonas pneumonia RLL  Discussion Tracheostomy, suctioning frequency improving. With poor mental status, he is unlikely to be a candidate for decannulation any time soon.     Plan: Discussed with RT and primary team-- stable to transfer back to the floor.  -Con't CPT with vest therapy -Adding tobramycin inhaled to reduce colonization of pseudomonas. Change trach tube today to reduce biofilms.  -CPT with vest -Can stop levofloxacin since sensitivities have not changed. Needs 2 week course of pip-tazo. -trach care per protocol -Overall prognosis is guarded with limited improvement in neurologic function.    PCCM will continue to follow intermittently for trach; will see next on Monday, March 4 unless he needs to be seen sooner.   Julian Hy, DO 12/13/22 5:26 PM [facility]  For contact information, see Amion. If no response to pager, please call PCCM consult pager. After hours, 7PM- 7AM, please call Elink.

## 2022-12-13 NOTE — Procedures (Signed)
Tracheostomy Change Note  Patient Details:   Name: Peyten Arnette DOB: [birth year] MRN: [id number]    Airway Documentation:     Evaluation  O2 sats: stable throughout Complications: No apparent complications Patient did tolerate procedure well. Bilateral Breath Sounds: Rhonchi  Pt trach changed to a shiley flex 6 uncuffed from a shiley flex 6 uncuffed.    Cathie Olden 12/13/2022, 3:08 PM

## 2022-12-13 NOTE — Plan of Care (Signed)

## 2022-12-13 NOTE — Progress Notes (Signed)
[facility] PROGRESS NOTE   Damon [name] [id number] DOB: 1960/11/29 DOA: 10/03/2022  PCP: [facility]  Brief History: Damon [name] is a 62 y.o. male with a history of HTN, HLD, tobacco use, chronic back pain who presented to the [facility] on 10/03/2022 with influenza A. He's had a protracted hospitalization summarized below.   Significant Events 12/18 Flu A positive 12/20 Admitted, bipap, 5 minute code MRSA PCR - neg Urine strep _POS 12/21 Intubated, paralyzed, ARDS protocol, on 7cc/kg 12/22 Weaned to 6 cc/kg with dyssynchrony, back on 7 cc/kg, driving pressures good, weaning vent 12/23 Attempt to wean sedation again with dyssynchrony and desaturation.  Some concern for cuff leak, tube exchange.  Cuff did not appear to be blown.  Ongoing signs of cuff leak, query leaking around cough, possible large trachea, diuresing 12/25 tolerating PSV, myoclonus> ceribell, Neuro consult, changed to propofol, diuresed  12/26 Changed to DNR. On Propofol, versed.  Tolerating PSV.  MRI brain with hypoxic/anoxic injury Blood culture neg 12/27 GPD's on EEG, pending transfer to [facility] for cEEG NEURO CONSULT 12/28 moved to [facility] for LTM EEG 12/31 family updated by neuro: no chance for meaningful recovery, they should make plans for comfort measures 1/1 cultures - resp normal flora 1/2 eeg still with epileptogenicity. Palli fam mtg -- family does not want to hear from medical teams unless there is "good news"  1/3  cEEG with burst suppression w highly epileptiform bursts.  1/4 cEEG with burst suppression  + highly epileptiform bursts still.  WBC slightly up to 22.5. Temp high 99 low 100  c-EEG stopped duie to lack of benefit Pallative care consult - wife upset about early dc from ER on 12/18 Chaplain consult: Wife is sufering from anticipatory grief + accepting god's way + curious/anger about events leading upto current state EEG: generalized epileptogenicity with high potential for  seizures as well as profound diffuse encephalopathy. In the setting of cardiac arrest, this EEG pattern is suggestive of anoxic/hypoxic brain injury.  1/5 - Low grade fever +. [name] 22.5K.  on Zosyn. On vent fio2 30%. TF +. Per RN - diprivan stopped yesterday. No myoclonus today. Mild tachycardia + Rpt pall care on 10/21/22 per wife 1/6 - On vent FiO2 30%.  Tmax 99,29F ., WBC better. Still off diprivan -> No myoclonus. cXR visualized - devices in place and faiure clear Oral oxy and klonpin stopped 1/8 No significant changes in neuro status. Tachycardic to 130s, hypertensive to 140s. Coreg transitioned to metoprolol. 1/11 wife considering trach/peg, to discuss with surgery-discussed with Dr. Bobbye Morton 1/12 more tachycardic 1/16-spoke with wife at length at bedside, Dr. Hulen Skains also had a long conversation with spouse-desires to continue current aggressive care and desires PEG and trach placed 1/18 perc trach and peg 11/05/2022 transferred to floor due to need for ICU bed 11/08/2022 transition to [facility] with pulmonary following weekly for trach 2/4 recurrent fever, cultures collected. 2/5 changed to cuffless trach 2/18 antibiotics started for Pseudomonas on tracheal aspirate cultre 2/22 concern for myoclonic seizure, neuro reconsulted 2/26 transferred to ICU for secretions    Subjective/Interval History: Patient with eyes open but does not respond to questions or commands.    Assessment/Plan:  Goals of Care IPAL from 1/24 reviewed. He had been DNR, but transitioned back to full code. - Remains full code per extensive discussions with patient's spouse by different providers.  - We will continue all aggressive medical work up/interventions as indicated.    Anoxic brain injury Myoclonic Seizure  activity  Neurology discussed with family on 12/31. 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  Signed off  12/31. EEG 2/22 consistent with anoxic/hypoxic brain injury.  EEG changes c/w myoclonic seizure. Neurology to evaluate, appreciate assistance (2/24 last note).  - leg movements on 2/23 did not correlate with seizure activity - Continue keppra, depakene - added clonazepam BID (if causing sedation, can discontinue per neurology 2/24 note).  Dose of Klonopin was decreased on 2/27.  Remains sedated.  May need to further titrate her down.  Will decide in 1 to 2 days.   - LTM d/c'd by neurology on 2/24 Stable for the most part.   Acute hypoxic respiratory failure, ARDS due to influenza A (+12/18) and superimposed Pneumococcal pneumonia: - s/p tamiflu 12/21-12/25 and ceftriaxone 12/20 - 12/26. Then had zosyn x7 days starting 12/31 per report. Tolerated this well.  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 Appreciate PCCM involvement for management. Site appears stable, oxygenation stable. - Having frequent suctioning of white secretions - repeat cxr (increasing right mid lower lung zone opacity, atelectasis vs pneumonia)  - transferred to ICU to better manage secretions. Respiratory status appears to be stabilizing.  Appreciate pulmonology assistance.  Possible transfer out of the ICU later today.   Pseudomonas pneumonia vs. tracheitis:  - Susceptibility testing completed, resistant to imipenem. Continue zosyn, confirmed to be sensitive.  Levoquin added by pulm due to concern for resistance.   Remains afebrile.  Recheck WBC tomorrow.  - culture 2/17 pseudomonas resistant to imipenem - repeat cultures pending 2/25 with pseudomonas, sensitivities reviewed.   - repeat cultures from 2/27 pending     PEA arrest:  - Continue cardiac monitoring, electrolyte monitoring.    Recurrent Fevers  Leukocytosis Intermittent fevers throughout hospital stay.  Has received multiple antibiotic courses.   See discussions above.   Constipation Laxatives on hold due to  frequent BMs.   Elevated LFT's LFTs were noted to be normal as of 12/12/2022.   Paroxysmal sympathetic hyperactivity Causing sinus tachycardia, hypotension.  - Continue propranolol at '40mg'$  q8h dose and neurontin.  - continue metoprolol prn for tachycardia   Hypotension - has been intermittent in the past, will monitoring - holding parameters for propanolol     Severe protein calorie malnutrition:  - Continue tube feeds thru PEG placed 11/01/2022. At goal rate and tolerating.    Prediabetes HbA1c 5.9% on 12/20.  - SSI q4h    Hypernatremia Mild, resolved with increased free water.   - Continue free water 278m q4h.    Diarrhea - will monitor for now, holding laxatives Seems to have slowed down.   Pressure Ulcers Pressure Injury 10/12/22 Sacrum Mid;Upper Stage 2 -  Partial thickness loss of dermis presenting as a shallow open injury with a red, pink wound bed without slough. (Active)  10/12/22 0200  Location: Sacrum  Location Orientation: Mid;Upper  Staging: Stage 2 -  Partial thickness loss of dermis presenting as a shallow open injury with a red, pink wound bed without slough.  Wound Description (Comments):   Present on Admission: Yes    DVT Prophylaxis: Lovenox Code Status: Full code Family Communication: No family at bedside Disposition Plan: To be determined  Status is: Inpatient Remains inpatient appropriate because: Acute respiratory failure, anoxic brain injury, seizure activities      Medications: Scheduled:  Chlorhexidine Gluconate Cloth  6 each Topical Daily   clonazepam  0.5 mg Oral BID  enoxaparin (LOVENOX) injection  40 mg Subcutaneous Q24H   famotidine  20 mg Per Tube QHS   feeding supplement (PROSource TF20)  60 mL Per Tube BID   fluconazole  100 mg Per Tube Daily   free water  200 mL Per Tube Q4H   gabapentin  100 mg Per Tube Q8H   guaiFENesin  400 mg Per Tube Q8H   leptospermum manuka honey  1 Application Topical QHS   levETIRAcetam  1,500 mg  Per Tube BID   nutrition supplement (JUVEN)  1 packet Per Tube BID BM   mouth rinse  15 mL Mouth Rinse 4 times per day   propranolol  40 mg Per Tube Q8H   scopolamine  1 patch Transdermal Q72H   valproic acid  500 mg Per Tube Q8H   Continuous:  sodium chloride 250 mL (12/13/22 0041)   feeding supplement (OSMOLITE 1.5 CAL) 65 mL/hr at 12/13/22 0600   levofloxacin (LEVAQUIN) IV Stopped (12/12/22 1117)   [id number] (TYLENOL) oral liquid 160 mg/5 mL **OR** acetaminophen, ipratropium-albuterol, metoprolol tartrate, ondansetron **OR** ondansetron (ZOFRAN) IV, mouth rinse, oxyCODONE  Antibiotics: Anti-infectives (From admission, onward)    Start     Dose/Rate Route Frequency Ordered Stop   12/12/22 2130  fluconazole (DIFLUCAN) 40 MG/ML suspension 100 mg  Status:  Discontinued        100 mg Oral Daily 12/12/22 2040 12/12/22 2043   12/12/22 2130  fluconazole (DIFLUCAN) tablet 100 mg        100 mg Per Tube Daily 12/12/22 2043 12/19/22 0959   12/11/22 0930  levofloxacin (LEVAQUIN) IVPB 750 mg        750 mg 100 mL/hr over 90 Minutes Intravenous Every 24 hours 12/11/22 0830     12/03/22 1115  piperacillin-tazobactam (ZOSYN) IVPB 3.375 g        3.375 g 12.5 mL/hr over 240 Minutes Intravenous Every 8 hours 12/03/22 1018 12/13/22 0746   12/02/22 1245  ceFEPIme (MAXIPIME) 2 g in sodium chloride 0.9 % 100 mL IVPB  Status:  Discontinued        2 g 200 mL/hr over 30 Minutes Intravenous Every 8 hours 12/02/22 1145 12/03/22 0940   11/05/22 0900  ceFEPIme (MAXIPIME) 2 g in sodium chloride 0.9 % 100 mL IVPB        2 g 200 mL/hr over 30 Minutes Intravenous Every 8 hours 11/05/22 0804 11/12/22 0050   11/01/22 1215  ceFAZolin (ANCEF) IVPB 2g/100 mL premix        2 g 200 mL/hr over 30 Minutes Intravenous  Once 11/01/22 0851 11/01/22 1232   10/14/22 1000  piperacillin-tazobactam (ZOSYN) IVPB 3.375 g        3.375 g 12.5 mL/hr over 240 Minutes Intravenous Every 8 hours 10/14/22 0951 10/21/22 0556    10/04/22 2200  cefTRIAXone (ROCEPHIN) 2 g in sodium chloride 0.9 % 100 mL IVPB  Status:  Discontinued        2 g 200 mL/hr over 30 Minutes Intravenous Daily at bedtime 10/04/22 0914 10/10/22 0900   10/04/22 1045  oseltamivir (TAMIFLU) 6 MG/ML suspension 75 mg        75 mg Per Tube 2 times daily 10/04/22 0950 10/08/22 2229   10/04/22 0600  cefTRIAXone (ROCEPHIN) 2 g in sodium chloride 0.9 % 100 mL IVPB  Status:  Discontinued        2 g 200 mL/hr over 30 Minutes Intravenous Every 24 hours 10/03/22 1054 10/03/22 1250   10/04/22 0600  azithromycin (ZITHROMAX)  500 mg in sodium chloride 0.9 % 250 mL IVPB  Status:  Discontinued        500 mg 250 mL/hr over 60 Minutes Intravenous Every 24 hours 10/03/22 1054 10/03/22 1250   10/03/22 2200  cefTRIAXone (ROCEPHIN) 1 g in sodium chloride 0.9 % 100 mL IVPB  Status:  Discontinued        1 g 200 mL/hr over 30 Minutes Intravenous Daily at bedtime 10/03/22 1559 10/04/22 0914   10/03/22 2200  metroNIDAZOLE (FLAGYL) IVPB 500 mg  Status:  Discontinued        500 mg 100 mL/hr over 60 Minutes Intravenous Every 12 hours 10/03/22 1559 10/05/22 0812   10/03/22 1400  piperacillin-tazobactam (ZOSYN) IVPB 3.375 g  Status:  Discontinued        3.375 g 12.5 mL/hr over 240 Minutes Intravenous Every 8 hours 10/03/22 1258 10/03/22 1559   10/03/22 0515  cefTRIAXone (ROCEPHIN) 1 g in sodium chloride 0.9 % 100 mL IVPB        1 g 200 mL/hr over 30 Minutes Intravenous  Once 10/03/22 0503 10/03/22 0732   10/03/22 0515  azithromycin (ZITHROMAX) 500 mg in sodium chloride 0.9 % 250 mL IVPB        500 mg 250 mL/hr over 60 Minutes Intravenous  Once 10/03/22 0503 10/03/22 0732       Objective:  Vital Signs  Vitals:   12/13/22 0400 12/13/22 0529 12/13/22 0700 12/13/22 0800  BP: (!) 135/102 (!) 125/93 106/83 120/87  Pulse: (!) 117 (!) 105  (!) 110  Resp: (!) 31 (!) 24 16 (!) 21  Temp: 99.1 F (37.3 C)   99.3 F (37.4 C)  TempSrc: Oral   Oral  SpO2: 96% 96%  94%   Weight:  55.3 kg    Height:        Intake/Output Summary (Last 24 hours) at 12/13/2022 0854 Last data filed at 12/13/2022 0800 Gross per 24 hour  Intake 2046.7 ml  Output 2735 ml  Net -688.3 ml    Filed Weights   12/11/22 0703 12/12/22 0500 12/13/22 0529  Weight: 61.2 kg 57.2 kg 55.3 kg    General appearance: Remains unresponsive Tracheostomy noted Resp: Mildly tachypneic.  Coarse breath sounds bilaterally with few crackles at the bases.  No wheezing or rhonchi. Cardio: S1-S2 is normal regular.  No S3-S4.  No rubs murmurs or bruit GI: Abdomen is soft.  Nontender nondistended.  PEG tube noted. Extremities: No edema.     Lab Results:  Data Reviewed: I have personally reviewed following labs and reports of the imaging studies  CBC: Recent Labs  Lab 12/08/22 0207 12/09/22 0157 12/10/22 1430 12/11/22 0200 12/12/22 0448  WBC 9.4 9.6 10.8* 11.0* 12.8*  NEUTROABS 5.3 5.9 5.6 6.8 7.1  HGB 10.6* 11.3* 11.5* 11.4* 11.4*  HCT 33.4* 35.6* 35.7* 36.3* 35.1*  MCV 88.4 89.0 89.3 91.0 87.8  PLT 274 262 284 278 304     Basic Metabolic Panel: Recent Labs  Lab 12/08/22 0207 12/09/22 0157 12/10/22 1430 12/11/22 0200 12/12/22 0448  NA 139 139 135 137 134*  K 4.1 4.1 4.3 4.0 4.4  CL 106 110 101 102 100  CO2 '24 23 23 '$ 21* 23  GLUCOSE 133* 109* 133* 140* 146*  BUN 34* 33* 37* 42* 35*  CREATININE 0.62 0.63 0.55* 0.61 0.61  CALCIUM 8.8* 8.8* 9.4 9.1 9.1  MG 2.2 2.2 2.1 2.2 2.2  PHOS 4.2 3.3 4.1 4.6 3.8     GFR: Estimated Creatinine  Clearance: 76.8 mL/min (by C-G formula based on SCr of 0.61 mg/dL).  Liver Function Tests: Recent Labs  Lab 12/08/22 0207 12/09/22 0157 12/10/22 1430 12/11/22 0200 12/12/22 0448  AST '20 24 25 24 31  '$ ALT 31 30 32 33 32  ALKPHOS 79 73 74 75 81  BILITOT 0.4 0.5 0.3 0.4 0.5  PROT 6.6 6.7 6.9 6.9 7.0  ALBUMIN 2.3* 2.2* 2.3* 2.3* 2.2*      Recent Labs  Lab 12/10/22 1430  AMMONIA 38*      CBG: Recent Labs  Lab 12/12/22 1626  12/12/22 2022 12/13/22 0018 12/13/22 0440 12/13/22 0801  GLUCAP 101* 122* 118* 124* 106*      Recent Results (from the past 240 hour(s))  Culture, blood (Routine X 2) w Reflex to ID Panel     Status: None   Collection Time: 12/05/22  2:47 PM   Specimen: BLOOD RIGHT ARM  Result Value Ref Range Status   Specimen Description BLOOD RIGHT ARM  Final   Special Requests   Final    BOTTLES DRAWN AEROBIC AND ANAEROBIC Blood Culture results may not be optimal due to an excessive volume of blood received in culture bottles   Culture   Final    NO GROWTH 5 DAYS Performed at [facility], [place] [address] [place], [place] [address]    Report Status 12/10/2022 FINAL  Final  Culture, blood (Routine X 2) w Reflex to ID Panel     Status: None   Collection Time: 12/05/22  2:47 PM   Specimen: BLOOD RIGHT HAND  Result Value Ref Range Status   Specimen Description BLOOD RIGHT HAND  Final   Special Requests   Final    BOTTLES DRAWN AEROBIC AND ANAEROBIC Blood Culture results may not be optimal due to an excessive volume of blood received in culture bottles   Culture   Final    NO GROWTH 5 DAYS Performed at [facility], [place] [address], [place], [place] [address]    Report Status 12/10/2022 FINAL  Final  Culture, Respiratory w Gram Stain     Status: None   Collection Time: 12/09/22  4:45 PM   Specimen: Tracheal Aspirate; Respiratory  Result Value Ref Range Status   Specimen Description TRACHEAL ASPIRATE  Final   Special Requests NONE  Final   Gram Stain   Final    MODERATE WBC PRESENT,BOTH PMN AND MONONUCLEAR RARE GRAM NEGATIVE RODS Performed at [facility], [place] [address] [place], [place] [address]    Culture FEW PSEUDOMONAS AERUGINOSA  Final   Report Status 12/12/2022 FINAL  Final   Organism ID, Bacteria PSEUDOMONAS AERUGINOSA  Final      Susceptibility   Pseudomonas aeruginosa - MIC*    CEFTAZIDIME 4 SENSITIVE Sensitive     CIPROFLOXACIN <=0.25 SENSITIVE Sensitive      GENTAMICIN <=1 SENSITIVE Sensitive     IMIPENEM >=16 RESISTANT Resistant     PIP/TAZO 16 SENSITIVE Sensitive     CEFEPIME 4 SENSITIVE Sensitive     * FEW PSEUDOMONAS AERUGINOSA  MRSA Next Gen by PCR, Nasal     Status: None   Collection Time: 12/09/22  5:25 PM   Specimen: Nasal Mucosa; Nasal Swab  Result Value Ref Range Status   MRSA by PCR Next Gen NOT DETECTED NOT DETECTED Final    Comment: (NOTE) The GeneXpert MRSA Assay (FDA approved for NASAL specimens only), is one component of a comprehensive MRSA colonization surveillance program. It  is not intended to diagnose MRSA infection nor to guide or monitor treatment for MRSA infections. Test performance is not FDA approved in patients less than 65 years old. Performed at [facility], [place] [address], [place], [place] [address]   Culture, Respiratory w Gram Stain     Status: None (Preliminary result)   Collection Time: 12/11/22  8:29 AM   Specimen: Tracheal Aspirate; Respiratory  Result Value Ref Range Status   Specimen Description TRACHEAL ASPIRATE  Final   Special Requests NONE  Final   Gram Stain   Final    ABUNDANT WBC PRESENT, PREDOMINANTLY PMN NO ORGANISMS SEEN    Culture   Final    FEW PSEUDOMONAS AERUGINOSA SUSCEPTIBILITIES TO FOLLOW Performed at [facility], [address], [place],  [address]    Report Status PENDING  Incomplete      Radiology Studies: No results found.     LOS: 30 days   Yanixan Mellinger [employer] on [email or web address]  12/13/2022, 8:54 AM

## 2022-12-14 DIAGNOSIS — Z930 Tracheostomy status: Secondary | ICD-10-CM | POA: Diagnosis not present

## 2022-12-14 DIAGNOSIS — J9601 Acute respiratory failure with hypoxia: Secondary | ICD-10-CM | POA: Diagnosis not present

## 2022-12-14 DIAGNOSIS — J151 Pneumonia due to Pseudomonas: Secondary | ICD-10-CM | POA: Diagnosis not present

## 2022-12-14 DIAGNOSIS — E440 Moderate protein-calorie malnutrition: Secondary | ICD-10-CM | POA: Insufficient documentation

## 2022-12-14 LAB — COMPREHENSIVE METABOLIC PANEL
ALT: 36 U/L (ref 0–44)
AST: 27 U/L (ref 15–41)
Albumin: 2.3 g/dL — ABNORMAL LOW (ref 3.5–5.0)
Alkaline Phosphatase: 65 U/L (ref 38–126)
Anion gap: 7 (ref 5–15)
BUN: 29 mg/dL — ABNORMAL HIGH (ref 6–20)
CO2: 24 mmol/L (ref 22–32)
Calcium: 9.2 mg/dL (ref 8.9–10.3)
Chloride: 103 mmol/L (ref 98–111)
Creatinine, Ser: 0.68 mg/dL (ref 0.61–1.24)
GFR, Estimated: >60 mL/min (ref >60)
Glucose, Bld: 114 mg/dL — ABNORMAL HIGH (ref 70–99)
Potassium: 4.5 mmol/L (ref 3.5–5.1)
Sodium: 134 mmol/L — ABNORMAL LOW (ref 135–145)
Total Bilirubin: 0.4 mg/dL (ref 0.3–1.2)
Total Protein: 7 g/dL (ref 6.5–8.1)

## 2022-12-14 LAB — CBC WITH DIFFERENTIAL/PLATELET
Abs Immature Granulocytes: 0 10*3/uL (ref 0.00–0.07)
Basophils Absolute: 0.1 10*3/uL (ref 0.0–0.1)
Basophils Relative: 1 %
Eosinophils Absolute: 0.6 10*3/uL — ABNORMAL HIGH (ref 0.0–0.5)
Eosinophils Relative: 5 %
HCT: 37 % — ABNORMAL LOW (ref 39.0–52.0)
Hemoglobin: 11.5 g/dL — ABNORMAL LOW (ref 13.0–17.0)
Lymphocytes Relative: 18 %
Lymphs Abs: 2.3 10*3/uL (ref 0.7–4.0)
MCH: 28.1 pg (ref 26.0–34.0)
MCHC: 31.1 g/dL (ref 30.0–36.0)
MCV: 90.5 fL (ref 80.0–100.0)
Monocytes Absolute: 1.7 10*3/uL — ABNORMAL HIGH (ref 0.1–1.0)
Monocytes Relative: 13 %
Neutro Abs: 8 10*3/uL — ABNORMAL HIGH (ref 1.7–7.7)
Neutrophils Relative %: 63 %
Platelets: 279 10*3/uL (ref 150–400)
RBC: 4.09 MIL/uL — ABNORMAL LOW (ref 4.22–5.81)
RDW: 15.8 % — ABNORMAL HIGH (ref 11.5–15.5)
WBC: 12.7 10*3/uL — ABNORMAL HIGH (ref 4.0–10.5)
nRBC: 0.5 % — ABNORMAL HIGH (ref 0.0–0.2)
nRBC: 2 /100 WBC — ABNORMAL HIGH

## 2022-12-14 LAB — GLUCOSE, CAPILLARY
Glucose-Capillary: 89 mg/dL (ref 70–99)
Glucose-Capillary: 101 mg/dL — ABNORMAL HIGH (ref 70–99)
Glucose-Capillary: 104 mg/dL — ABNORMAL HIGH (ref 70–99)
Glucose-Capillary: 94 mg/dL (ref 70–99)
Glucose-Capillary: 107 mg/dL — ABNORMAL HIGH (ref 70–99)

## 2022-12-14 LAB — MAGNESIUM: Magnesium: 2.1 mg/dL (ref 1.7–2.4)

## 2022-12-14 LAB — PHOSPHORUS: Phosphorus: 4.8 mg/dL — ABNORMAL HIGH (ref 2.5–4.6)

## 2022-12-14 MED ORDER — BANATROL TF EN LIQD
60.0000 mL | Freq: Two times a day (BID) | ENTERAL | Status: DC
  Administered 2022-12-14 – 2022-12-19 (×10): 60 mL
  Filled 2022-12-14 (×10): qty 60

## 2022-12-14 MED ORDER — FREE WATER
200.0000 mL | Freq: Four times a day (QID) | Status: DC
  Administered 2022-12-14 – 2022-12-24 (×40): 200 mL

## 2022-12-14 MED ORDER — OSMOLITE 1.5 CAL PO LIQD
1000.0000 mL | ORAL | Status: DC
  Administered 2022-12-14 – 2022-12-25 (×15): 1000 mL
  Filled 2022-12-14 (×6): qty 1000

## 2022-12-14 NOTE — Progress Notes (Signed)
[facility] PROGRESS NOTE   Damon [name] [id number] DOB: 1960-12-01 DOA: 10/03/2022  PCP: [facility]  Brief History: Damon [name] is a 62 y.o. male with a history of HTN, HLD, tobacco use, chronic back pain who presented to the [facility] on 10/03/2022 with influenza A. He's had a protracted hospitalization summarized below.   Significant Events 12/18 Flu A positive 12/20 Admitted, bipap, 5 minute code MRSA PCR - neg Urine strep _POS 12/21 Intubated, paralyzed, ARDS protocol, on 7cc/kg 12/22 Weaned to 6 cc/kg with dyssynchrony, back on 7 cc/kg, driving pressures good, weaning vent 12/23 Attempt to wean sedation again with dyssynchrony and desaturation.  Some concern for cuff leak, tube exchange.  Cuff did not appear to be blown.  Ongoing signs of cuff leak, query leaking around cough, possible large trachea, diuresing 12/25 tolerating PSV, myoclonus> ceribell, Neuro consult, changed to propofol, diuresed  12/26 Changed to DNR. On Propofol, versed.  Tolerating PSV.  MRI brain with hypoxic/anoxic injury Blood culture neg 12/27 GPD's on EEG, pending transfer to [facility] for cEEG NEURO CONSULT 12/28 moved to [facility] for LTM EEG 12/31 family updated by neuro: no chance for meaningful recovery, they should make plans for comfort measures 1/1 cultures - resp normal flora 1/2 eeg still with epileptogenicity. Palli fam mtg -- family does not want to hear from medical teams unless there is "good news"  1/3  cEEG with burst suppression w highly epileptiform bursts.  1/4 cEEG with burst suppression  + highly epileptiform bursts still.  WBC slightly up to 22.5. Temp high 99 low 100  c-EEG stopped duie to lack of benefit Pallative care consult - wife upset about early dc from ER on 12/18 Chaplain consult: Wife is sufering from anticipatory grief + accepting god's way + curious/anger about events leading upto current state EEG: generalized epileptogenicity with high potential for  seizures as well as profound diffuse encephalopathy. In the setting of cardiac arrest, this EEG pattern is suggestive of anoxic/hypoxic brain injury.  1/5 - Low grade fever +. [place] 22.5K.  on Zosyn. On vent fio2 30%. TF +. Per RN - diprivan stopped yesterday. No myoclonus today. Mild tachycardia + Rpt pall care on 10/21/22 per wife 1/6 - On vent FiO2 30%.  Tmax 99,53F ., WBC better. Still off diprivan -> No myoclonus. cXR visualized - devices in place and faiure clear Oral oxy and klonpin stopped 1/8 No significant changes in neuro status. Tachycardic to 130s, hypertensive to 140s. Coreg transitioned to metoprolol. 1/11 wife considering trach/peg, to discuss with surgery-discussed with Dr. Bobbye Morton 1/12 more tachycardic 1/16-spoke with wife at length at bedside, Dr. Hulen Skains also had a long conversation with spouse-desires to continue current aggressive care and desires PEG and trach placed 1/18 perc trach and peg 11/05/2022 transferred to floor due to need for ICU bed 11/08/2022 transition to [facility] with pulmonary following weekly for trach 2/4 recurrent fever, cultures collected. 2/5 changed to cuffless trach 2/18 antibiotics started for Pseudomonas on tracheal aspirate cultre 2/22 concern for myoclonic seizure, neuro reconsulted 2/26 transferred to ICU for secretions    Subjective/Interval History: Patient with eyes closed.  Does not respond to questions or commands.  Assessment/Plan:  Goals of Care IPAL from 1/24 reviewed. He had been DNR, but transitioned back to full code. - Remains full code per extensive discussions with patient's spouse by different providers.  - We will continue all aggressive medical work up/interventions as indicated.    Anoxic brain injury Myoclonic Seizure activity  Neurology discussed with family on 12/31. 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  Signed off  12/31. EEG 2/22 consistent with anoxic/hypoxic brain injury.  EEG changes c/w myoclonic seizure. Neurology to evaluate, appreciate assistance (2/24 last note).  - leg movements on 2/23 did not correlate with seizure activity - Continue keppra, depakene - added clonazepam BID (if causing sedation, can discontinue per neurology 2/24 note).  Dose of Klonopin was decreased on 2/27.  Continue current dose for now.  May need to titrate further in the next few days.   - LTM d/c'd by neurology on 2/24 No significant improvement noted in the last several days.  Remains unresponsive.   Acute hypoxic respiratory failure, ARDS due to influenza A (+12/18) and superimposed Pneumococcal pneumonia: - s/p tamiflu 12/21-12/25 and ceftriaxone 12/20 - 12/26. Then had zosyn x7 days starting 12/31 per report. Tolerated this well.  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 Appreciate PCCM involvement for management. Site appears stable, oxygenation stable. - Having frequent suctioning of white secretions - repeat cxr (increasing right mid lower lung zone opacity, atelectasis vs pneumonia).  Patient was transferred back to the ICU due to significant secretions.  He underwent chest physiotherapy.   Cleared by pulmonology for transfer out of the ICU.   Pseudomonas pneumonia vs. tracheitis:  - culture 2/17 pseudomonas resistant to imipenem - repeat cultures pending 2/25 with pseudomonas, sensitivities reviewed.   - repeat cultures from 2/27 sensitivities reviewed. Patient remains on Zosyn.  Levaquin was added for a few days but then discontinued by pulmonology.  They recommend 10 days of treatment with Zosyn.   PEA arrest:  - Continue cardiac monitoring, electrolyte monitoring.    Recurrent Fevers  Leukocytosis Intermittent fevers throughout hospital stay.  Has received multiple antibiotic courses.   See discussions above.   Constipation Laxatives on hold due to frequent  BMs.   Elevated LFT's LFTs were noted to be normal as of 12/12/2022.   Paroxysmal sympathetic hyperactivity Causing sinus tachycardia, hypotension.  - Continue propranolol at '40mg'$  q8h dose and neurontin.  - continue metoprolol prn for tachycardia   Hypotension Blood pressure stable for the most part.    Severe protein calorie malnutrition:  - Continue tube feeds thru PEG placed 11/01/2022.   Prediabetes HbA1c 5.9% on 12/20.  - SSI q4h    Hypernatremia Mild, resolved with increased free water.  Sodium level is now in the hyponatremia range.  Will cut back on free water.   Diarrhea - will monitor for now, holding laxatives Seems to have slowed down.   Pressure Ulcers Pressure Injury 10/12/22 Sacrum Mid;Upper Stage 2 -  Partial thickness loss of dermis presenting as a shallow open injury with a red, pink wound bed without slough. (Active)  10/12/22 0200  Location: Sacrum  Location Orientation: Mid;Upper  Staging: Stage 2 -  Partial thickness loss of dermis presenting as a shallow open injury with a red, pink wound bed without slough.  Wound Description (Comments):   Present on Admission: Yes    DVT Prophylaxis: Lovenox Code Status: Full code Family Communication: No family at bedside Disposition Plan: To be determined  Status is: Inpatient Remains inpatient appropriate because: Acute respiratory failure, anoxic brain injury, seizure activities      Medications: Scheduled:  Chlorhexidine Gluconate Cloth  6 each Topical Daily   clonazepam  1 mg Oral BID   enoxaparin (LOVENOX) injection  40 mg Subcutaneous Q24H  famotidine  20 mg Per Tube QHS   feeding supplement (PROSource TF20)  60 mL Per Tube BID   fluconazole  100 mg Per Tube Daily   free water  200 mL Per Tube Q4H   gabapentin  100 mg Per Tube Q8H   guaiFENesin  400 mg Per Tube Q8H   leptospermum manuka honey  1 Application Topical QHS   levETIRAcetam  1,500 mg Per Tube BID   nutrition supplement (JUVEN)  1  packet Per Tube BID BM   mouth rinse  15 mL Mouth Rinse 4 times per day   propranolol  40 mg Per Tube Q8H   scopolamine  1 patch Transdermal Q72H   tobramycin (PF)  300 mg Nebulization BID   valproic acid  500 mg Per Tube Q8H   Continuous:  sodium chloride 250 mL (12/13/22 0041)   feeding supplement (OSMOLITE 1.5 CAL) 65 mL/hr at 12/14/22 0524   piperacillin-tazobactam (ZOSYN)  IV 12.5 mL/hr at 12/14/22 0524   [id number] (TYLENOL) oral liquid 160 mg/5 mL **OR** acetaminophen, ipratropium-albuterol, metoprolol tartrate, ondansetron **OR** ondansetron (ZOFRAN) IV, mouth rinse, oxyCODONE  Antibiotics: Anti-infectives (From admission, onward)    Start     Dose/Rate Route Frequency Ordered Stop   12/13/22 1200  piperacillin-tazobactam (ZOSYN) IVPB 3.375 g        3.375 g 12.5 mL/hr over 240 Minutes Intravenous Every 8 hours 12/13/22 1003 12/16/22 2359   12/13/22 1100  tobramycin (PF) (TOBI) nebulizer solution 300 mg        300 mg Nebulization 2 times daily 12/13/22 1010     12/12/22 2130  fluconazole (DIFLUCAN) 40 MG/ML suspension 100 mg  Status:  Discontinued        100 mg Oral Daily 12/12/22 2040 12/12/22 2043   12/12/22 2130  fluconazole (DIFLUCAN) tablet 100 mg        100 mg Per Tube Daily 12/12/22 2043 12/19/22 0959   12/11/22 0930  levofloxacin (LEVAQUIN) IVPB 750 mg        750 mg 100 mL/hr over 90 Minutes Intravenous Every 24 hours 12/11/22 0830 12/13/22 1246   12/03/22 1115  piperacillin-tazobactam (ZOSYN) IVPB 3.375 g        3.375 g 12.5 mL/hr over 240 Minutes Intravenous Every 8 hours 12/03/22 1018 12/13/22 0746   12/02/22 1245  ceFEPIme (MAXIPIME) 2 g in sodium chloride 0.9 % 100 mL IVPB  Status:  Discontinued        2 g 200 mL/hr over 30 Minutes Intravenous Every 8 hours 12/02/22 1145 12/03/22 0940   11/05/22 0900  ceFEPIme (MAXIPIME) 2 g in sodium chloride 0.9 % 100 mL IVPB        2 g 200 mL/hr over 30 Minutes Intravenous Every 8 hours 11/05/22 0804 11/12/22 0050    11/01/22 1215  ceFAZolin (ANCEF) IVPB 2g/100 mL premix        2 g 200 mL/hr over 30 Minutes Intravenous  Once 11/01/22 0851 11/01/22 1232   10/14/22 1000  piperacillin-tazobactam (ZOSYN) IVPB 3.375 g        3.375 g 12.5 mL/hr over 240 Minutes Intravenous Every 8 hours 10/14/22 0951 10/21/22 0556   10/04/22 2200  cefTRIAXone (ROCEPHIN) 2 g in sodium chloride 0.9 % 100 mL IVPB  Status:  Discontinued        2 g 200 mL/hr over 30 Minutes Intravenous Daily at bedtime 10/04/22 0914 10/10/22 0900   10/04/22 1045  oseltamivir (TAMIFLU) 6 MG/ML suspension 75 mg  75 mg Per Tube 2 times daily 10/04/22 0950 10/08/22 2229   10/04/22 0600  cefTRIAXone (ROCEPHIN) 2 g in sodium chloride 0.9 % 100 mL IVPB  Status:  Discontinued        2 g 200 mL/hr over 30 Minutes Intravenous Every 24 hours 10/03/22 1054 10/03/22 1250   10/04/22 0600  azithromycin (ZITHROMAX) 500 mg in sodium chloride 0.9 % 250 mL IVPB  Status:  Discontinued        500 mg 250 mL/hr over 60 Minutes Intravenous Every 24 hours 10/03/22 1054 10/03/22 1250   10/03/22 2200  cefTRIAXone (ROCEPHIN) 1 g in sodium chloride 0.9 % 100 mL IVPB  Status:  Discontinued        1 g 200 mL/hr over 30 Minutes Intravenous Daily at bedtime 10/03/22 1559 10/04/22 0914   10/03/22 2200  metroNIDAZOLE (FLAGYL) IVPB 500 mg  Status:  Discontinued        500 mg 100 mL/hr over 60 Minutes Intravenous Every 12 hours 10/03/22 1559 10/05/22 0812   10/03/22 1400  piperacillin-tazobactam (ZOSYN) IVPB 3.375 g  Status:  Discontinued        3.375 g 12.5 mL/hr over 240 Minutes Intravenous Every 8 hours 10/03/22 1258 10/03/22 1559   10/03/22 0515  cefTRIAXone (ROCEPHIN) 1 g in sodium chloride 0.9 % 100 mL IVPB        1 g 200 mL/hr over 30 Minutes Intravenous  Once 10/03/22 0503 10/03/22 0732   10/03/22 0515  azithromycin (ZITHROMAX) 500 mg in sodium chloride 0.9 % 250 mL IVPB        500 mg 250 mL/hr over 60 Minutes Intravenous  Once 10/03/22 0503 10/03/22 0732        Objective:  Vital Signs  Vitals:   12/14/22 0702 12/14/22 0729 12/14/22 0800 12/14/22 0854  BP:   110/86   Pulse:  (!) 107 (!) 105   Resp:  19 (!) 24   Temp:    98 F (36.7 C)  TempSrc:    Oral  SpO2:  97% 99%   Weight: 60.3 kg     Height:        Intake/Output Summary (Last 24 hours) at 12/14/2022 0855 Last data filed at 12/14/2022 0754 Gross per 24 hour  Intake 2306.65 ml  Output 2550 ml  Net -243.35 ml    Filed Weights   12/12/22 0500 12/13/22 0529 12/14/22 0702  Weight: 57.2 kg 55.3 kg 60.3 kg    General appearance: Unresponsive Resp: Coarse breath sounds bilaterally.  No wheezing or rhonchi.  Few crackles in the bases. Cardio: S1-S2 is tachycardic regular. GI: Abdomen is soft.  Nontender nondistended.  Bowel sounds are present normal.  No masses organomegaly.  PEG tube is noted Extremities: No edema.       Lab Results:  Data Reviewed: I have personally reviewed following labs and reports of the imaging studies  CBC: Recent Labs  Lab 12/09/22 0157 12/10/22 1430 12/11/22 0200 12/12/22 0448 12/14/22 0732  WBC 9.6 10.8* 11.0* 12.8* 12.7*  NEUTROABS 5.9 5.6 6.8 7.1 8.0*  HGB 11.3* 11.5* 11.4* 11.4* 11.5*  HCT 35.6* 35.7* 36.3* 35.1* 37.0*  MCV 89.0 89.3 91.0 87.8 90.5  PLT 262 284 278 304 279     Basic Metabolic Panel: Recent Labs  Lab 12/09/22 0157 12/10/22 1430 12/11/22 0200 12/12/22 0448 12/14/22 0732  NA 139 135 137 134* 134*  K 4.1 4.3 4.0 4.4 4.5  CL 110 101 102 100 103  CO2 23 23 21*  23 24  GLUCOSE 109* 133* 140* 146* 114*  BUN 33* 37* 42* 35* 29*  CREATININE 0.63 0.55* 0.61 0.61 0.68  CALCIUM 8.8* 9.4 9.1 9.1 9.2  MG 2.2 2.1 2.2 2.2 2.1  PHOS 3.3 4.1 4.6 3.8 4.8*     GFR: Estimated Creatinine Clearance: 82.2 mL/min (by C-G formula based on SCr of 0.68 mg/dL).  Liver Function Tests: Recent Labs  Lab 12/09/22 0157 12/10/22 1430 12/11/22 0200 12/12/22 0448 12/14/22 0732  AST '24 25 24 31 27  '$ ALT 30 32 33 32 36  ALKPHOS  73 74 75 81 65  BILITOT 0.5 0.3 0.4 0.5 0.4  PROT 6.7 6.9 6.9 7.0 7.0  ALBUMIN 2.2* 2.3* 2.3* 2.2* 2.3*      Recent Labs  Lab 12/10/22 1430  AMMONIA 38*      CBG: Recent Labs  Lab 12/13/22 1200 12/13/22 1602 12/13/22 2005 12/13/22 2328 12/14/22 0410  GLUCAP 107* 101* 119* 108* 89      Recent Results (from the past 240 hour(s))  Culture, blood (Routine X 2) w Reflex to ID Panel     Status: None   Collection Time: 12/05/22  2:47 PM   Specimen: BLOOD RIGHT ARM  Result Value Ref Range Status   Specimen Description BLOOD RIGHT ARM  Final   Special Requests   Final    BOTTLES DRAWN AEROBIC AND ANAEROBIC Blood Culture results may not be optimal due to an excessive volume of blood received in culture bottles   Culture   Final    NO GROWTH 5 DAYS Performed at [facility], [place] [address] [place], [place] [address]    Report Status 12/10/2022 FINAL  Final  Culture, blood (Routine X 2) w Reflex to ID Panel     Status: None   Collection Time: 12/05/22  2:47 PM   Specimen: BLOOD RIGHT HAND  Result Value Ref Range Status   Specimen Description BLOOD RIGHT HAND  Final   Special Requests   Final    BOTTLES DRAWN AEROBIC AND ANAEROBIC Blood Culture results may not be optimal due to an excessive volume of blood received in culture bottles   Culture   Final    NO GROWTH 5 DAYS Performed at [facility], [place] [address] [place], [place] [address]    Report Status 12/10/2022 FINAL  Final  Culture, Respiratory w Gram Stain     Status: None   Collection Time: 12/09/22  4:45 PM   Specimen: Tracheal Aspirate; Respiratory  Result Value Ref Range Status   Specimen Description TRACHEAL ASPIRATE  Final   Special Requests NONE  Final   Gram Stain   Final    MODERATE WBC PRESENT,BOTH PMN AND MONONUCLEAR RARE GRAM NEGATIVE RODS Performed at [facility], [place] [address], [place], [place] [address]    Culture FEW PSEUDOMONAS AERUGINOSA  Final   Report Status  12/12/2022 FINAL  Final   Organism ID, Bacteria PSEUDOMONAS AERUGINOSA  Final      Susceptibility   Pseudomonas aeruginosa - MIC*    CEFTAZIDIME 4 SENSITIVE Sensitive     CIPROFLOXACIN <=0.25 SENSITIVE Sensitive     GENTAMICIN <=1 SENSITIVE Sensitive     IMIPENEM >=16 RESISTANT Resistant     PIP/TAZO 16 SENSITIVE Sensitive     CEFEPIME 4 SENSITIVE Sensitive     * FEW PSEUDOMONAS AERUGINOSA  MRSA Next Gen by PCR, Nasal     Status: None   Collection Time: 12/09/22  5:25 PM  Specimen: Nasal Mucosa; Nasal Swab  Result Value Ref Range Status   MRSA by PCR Next Gen NOT DETECTED NOT DETECTED Final    Comment: (NOTE) The GeneXpert MRSA Assay (FDA approved for NASAL specimens only), is one component of a comprehensive MRSA colonization surveillance program. It is not intended to diagnose MRSA infection nor to guide or monitor treatment for MRSA infections. Test performance is not FDA approved in patients less than 51 years old. Performed at [facility], [place] [address] [place], [place] [address]   Culture, Respiratory w Gram Stain     Status: None   Collection Time: 12/11/22  8:29 AM   Specimen: Tracheal Aspirate; Respiratory  Result Value Ref Range Status   Specimen Description TRACHEAL ASPIRATE  Final   Special Requests NONE  Final   Gram Stain   Final    ABUNDANT WBC PRESENT, PREDOMINANTLY PMN NO ORGANISMS SEEN Performed at [facility], [address], [place], [place] [address]    Culture FEW PSEUDOMONAS AERUGINOSA  Final   Report Status 12/13/2022 FINAL  Final   Organism ID, Bacteria PSEUDOMONAS AERUGINOSA  Final      Susceptibility   Pseudomonas aeruginosa - MIC*    CEFTAZIDIME 4 SENSITIVE Sensitive     CIPROFLOXACIN <=0.25 SENSITIVE Sensitive     GENTAMICIN <=1 SENSITIVE Sensitive     IMIPENEM >=16 RESISTANT Resistant     PIP/TAZO 16 SENSITIVE Sensitive     CEFEPIME 2 SENSITIVE Sensitive     * FEW PSEUDOMONAS AERUGINOSA      Radiology Studies: No  results found.     LOS: 72 days   Hailee Hollick [employer] on [email or web address]  12/14/2022, 8:55 AM

## 2022-12-14 NOTE — Plan of Care (Signed)

## 2022-12-14 NOTE — Progress Notes (Signed)
Nutrition Follow-up  DOCUMENTATION CODES:   Non-severe (moderate) malnutrition in context of acute illness/injury  INTERVENTION:   Tube Feeding via PEG: Increase Jevity 1.5 to 70 ml/hr Pro-Source TF20 1m BID This provides 2680 kcals, 145 g of protein, 1277 mL of free water  Current free water flush of 200 mL q 6 hours plus TF provides 2077 mL of free water  Add Banatrol TF BID, each packet provides 5 g of soluble fiber, to thicken stool consistency  Continue Juven BID, each packet provides 80 calories, 8 grams of carbohydrate, 2.5  grams of protein (collagen), 7 grams of L-arginine and 7 grams of L-glutamine; supplement contains CaHMB, Vitamins C, E, B12 and Zinc to promote wound healing   NUTRITION DIAGNOSIS:   Moderate Malnutrition related to acute illness as evidenced by mild fat depletion, moderate fat depletion, severe fat depletion, severe muscle depletion, percent weight loss.  Being addressed by increasing TF regimen  GOAL:   Patient will meet greater than or equal to 90% of their needs  Progressing  MONITOR:   Labs, Weight trends, TF tolerance, Skin, I & O's  REASON FOR ASSESSMENT:   Consult Enteral/tube feeding initiation and management  ASSESSMENT:   Pt admitted from home with the flu leading to acute respiratory failure with hypoxia and PEA arrest upon admission. PMH significant for HTN, HLD, chronic back pain, tobacco use.  Pt remains on vent support via trach, scopolamine patch ordered for secretions   Weight has fluctuated up and down since admission but appears to overall stable recently.  Wife reports UBW around 135 pounds. Current wt 122 pounds. Lowest weight 108 this admission. Noted TF has been increased this admission due to weight trend.    Osmolite 1.5 at 65 ml/hr via PEG (since 11/07/22 when rate increased), Pros-Source TF20 60 mL BID, Juven BID Free water flush of 200 mL q 6 hours   RD performed NFPE and pt opened eyes to touch. Repeat  NFPE shows significant depletions. NFPE from 12/21 indicating no depletions except for mild depletions in thigh, clavicle. 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 Pt with increased physical labor which caused pt to lose some wt per Wife. Wife also reports that she believes pt had been eating less PTA because he was working so much.    Noted unstageable wound  Labs: reviewed Meds: reviewed  NUTRITION - FOCUSED PHYSICAL EXAM:  Flowsheet Row Most Recent Value  Orbital Region Severe depletion  Upper Arm Region Moderate depletion  Thoracic and Lumbar Region Mild depletion  Buccal Region Severe depletion  Temple Region Severe depletion  Clavicle Bone Region Severe depletion  Clavicle and Acromion Bone Region Severe depletion  Scapular Bone Region Severe depletion  Dorsal Hand Severe depletion  Patellar Region Severe depletion  Anterior Thigh Region Severe depletion  Posterior Calf Region Severe depletion  Edema (RD Assessment) Mild  Hair Reviewed  Eyes Unable to assess  Mouth Unable to assess  Skin Reviewed  Nails Reviewed       Diet Order:   Diet Order     None  EDUCATION NEEDS:   No education needs have been identified at this time  Skin:  Skin Assessment: Skin Integrity Issues: Skin Integrity Issues:: Unstageable Stage II: N/A Unstageable: sacrum (8cm x 1.5 cm x 0 cm)-WOC evaluated on 1/17  Last BM:  2/29 rectal tube  Height:   Ht Readings from Last 1 Encounters:  10/11/22 '5\' 4"'$  (1.626 m)    Weight:   Wt Readings from Last 1 Encounters:  12/14/22 60.3 kg     BMI:   Body mass index is 22.83 kg/m.  Estimated Nutritional Needs:   Kcal:  2500-2700 kcals  Protein:  125-150 g  Fluid:  >/= 2 L/day   Kerman Passey MS, RDN, LDN, CNSC Registered Dietitian 3 Clinical Nutrition RD Pager and On-Call Pager Number Located in [place]

## 2022-12-14 NOTE — Progress Notes (Signed)
CPT held patient being bathed.

## 2022-12-15 DIAGNOSIS — J151 Pneumonia due to Pseudomonas: Secondary | ICD-10-CM | POA: Diagnosis not present

## 2022-12-15 DIAGNOSIS — J9601 Acute respiratory failure with hypoxia: Secondary | ICD-10-CM | POA: Diagnosis not present

## 2022-12-15 DIAGNOSIS — Z930 Tracheostomy status: Secondary | ICD-10-CM | POA: Diagnosis not present

## 2022-12-15 LAB — GLUCOSE, CAPILLARY
Glucose-Capillary: 116 mg/dL — ABNORMAL HIGH (ref 70–99)
Glucose-Capillary: 122 mg/dL — ABNORMAL HIGH (ref 70–99)
Glucose-Capillary: 122 mg/dL — ABNORMAL HIGH (ref 70–99)
Glucose-Capillary: 124 mg/dL — ABNORMAL HIGH (ref 70–99)
Glucose-Capillary: 132 mg/dL — ABNORMAL HIGH (ref 70–99)
Glucose-Capillary: 137 mg/dL — ABNORMAL HIGH (ref 70–99)
Glucose-Capillary: 153 mg/dL — ABNORMAL HIGH (ref 70–99)

## 2022-12-15 NOTE — Progress Notes (Signed)
[facility] PROGRESS NOTE   Damon [name] [id number] DOB: Jul 05, 1961 DOA: 10/03/2022  PCP: [facility]  Brief History: Damon [name] is a 62 y.o. male with a history of HTN, HLD, tobacco use, chronic back pain who presented to the [facility] on 10/03/2022 with influenza A. He's had a protracted hospitalization summarized below.   Significant Events 12/18 Flu A positive 12/20 Admitted, bipap, 5 minute code MRSA PCR - neg Urine strep _POS 12/21 Intubated, paralyzed, ARDS protocol, on 7cc/kg 12/22 Weaned to 6 cc/kg with dyssynchrony, back on 7 cc/kg, driving pressures good, weaning vent 12/23 Attempt to wean sedation again with dyssynchrony and desaturation.  Some concern for cuff leak, tube exchange.  Cuff did not appear to be blown.  Ongoing signs of cuff leak, query leaking around cough, possible large trachea, diuresing 12/25 tolerating PSV, myoclonus> ceribell, Neuro consult, changed to propofol, diuresed  12/26 Changed to DNR. On Propofol, versed.  Tolerating PSV.  MRI brain with hypoxic/anoxic injury Blood culture neg 12/27 GPD's on EEG, pending transfer to [facility] for cEEG NEURO CONSULT 12/28 moved to [facility] for LTM EEG 12/31 family updated by neuro: no chance for meaningful recovery, they should make plans for comfort measures 1/1 cultures - resp normal flora 1/2 eeg still with epileptogenicity. Palli fam mtg -- family does not want to hear from medical teams unless there is "good news"  1/3  cEEG with burst suppression w highly epileptiform bursts.  1/4 cEEG with burst suppression  + highly epileptiform bursts still.  WBC slightly up to 22.5. Temp high 99 low 100  c-EEG stopped duie to lack of benefit Pallative care consult - wife upset about early dc from ER on 12/18 Chaplain consult: Wife is sufering from anticipatory grief + accepting god's way + curious/anger about events leading upto current state EEG: generalized epileptogenicity with high potential for  seizures as well as profound diffuse encephalopathy. In the setting of cardiac arrest, this EEG pattern is suggestive of anoxic/hypoxic brain injury.  1/5 - Low grade fever +. Fletcher 22.5K.  on Zosyn. On vent fio2 30%. TF +. Per RN - diprivan stopped yesterday. No myoclonus today. Mild tachycardia + Rpt pall care on 10/21/22 per wife 1/6 - On vent FiO2 30%.  Tmax 99,24F ., WBC better. Still off diprivan -> No myoclonus. cXR visualized - devices in place and faiure clear Oral oxy and klonpin stopped 1/8 No significant changes in neuro status. Tachycardic to 130s, hypertensive to 140s. Coreg transitioned to metoprolol. 1/11 wife considering trach/peg, to discuss with surgery-discussed with Dr. Bobbye Morton 1/12 more tachycardic 1/16-spoke with wife at length at bedside, Dr. Hulen Skains also had a long conversation with spouse-desires to continue current aggressive care and desires PEG and trach placed 1/18 perc trach and peg 11/05/2022 transferred to floor due to need for ICU bed 11/08/2022 transition to [facility] with pulmonary following weekly for trach 2/4 recurrent fever, cultures collected. 2/5 changed to cuffless trach 2/18 antibiotics started for Pseudomonas on tracheal aspirate cultre 2/22 concern for myoclonic seizure, neuro reconsulted 2/26 transferred to ICU for secretions 3/1: Respiratory status has improved.  Okay for transfer back to progressive floor.    Subjective/Interval History: Patient remains unresponsive for the most part.  Assessment/Plan:  Goals of Care IPAL from 1/24 reviewed. He had been DNR, but transitioned back to full code. - Remains full code per extensive discussions with patient's spouse by different providers.    Anoxic brain injury Myoclonic Seizure activity  Neurology discussed with family  on 12/31. 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  Signed off 12/31. EEG 2/22  consistent with anoxic/hypoxic brain injury.  EEG changes c/w myoclonic seizure. Neurology to evaluate, appreciate assistance (2/24 last note).  - leg movements on 2/23 did not correlate with seizure activity - Continue keppra, depakene - added clonazepam BID (if causing sedation, can discontinue per neurology 2/24 note).  Dose of Klonopin was decreased on 2/27.  Continue current dose for now.  May need to titrate further in the next few days.   - LTM d/c'd by neurology on 2/24 No significant improvement noted in the last several days.  Remains unresponsive.   Acute hypoxic respiratory failure, ARDS due to influenza A (+12/18) and superimposed Pneumococcal pneumonia: - s/p tamiflu 12/21-12/25 and ceftriaxone 12/20 - 12/26. Then had zosyn x7 days starting 12/31 per report. Tolerated this well.  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 Appreciate PCCM involvement for management. Site appears stable, oxygenation stable. - Having frequent suctioning of white secretions - repeat cxr (increasing right mid lower lung zone opacity, atelectasis vs pneumonia).  Patient was transferred back to the ICU due to significant secretions.  He underwent chest physiotherapy.   Cleared by pulmonology for transfer out of the ICU.   Pseudomonas pneumonia vs. tracheitis:  - culture 2/17 pseudomonas resistant to imipenem - repeat cultures pending 2/25 with pseudomonas, sensitivities reviewed.   - repeat cultures from 2/27 sensitivities reviewed. Patient remains on Zosyn.  Levaquin was added for a few days but then discontinued by pulmonology.  They recommend 10 days of treatment with Zosyn.   PEA arrest:  - Continue cardiac monitoring, electrolyte monitoring.    Recurrent Fevers  Leukocytosis Intermittent fevers throughout hospital stay.  Has received multiple antibiotic courses.   See discussions above.   Constipation Laxatives on hold due to frequent BMs.   Elevated  LFT's LFTs were noted to be normal as of 12/12/2022.   Paroxysmal sympathetic hyperactivity Causing sinus tachycardia, hypotension.  - Continue propranolol at '40mg'$  q8h dose and neurontin.  - continue metoprolol prn for tachycardia   Hypotension Blood pressure stable for the most part.    Severe protein calorie malnutrition:  - Continue tube feeds thru PEG placed 11/01/2022.   Prediabetes HbA1c 5.9% on 12/20.  - SSI q4h    Hypernatremia Mild, resolved with increased free water.  Sodium level is now in the hyponatremia range.  Will cut back on free water.  Recheck labs tomorrow.   Diarrhea - will monitor for now, holding laxatives Seems to have slowed down.   Pressure Ulcers Pressure Injury 10/12/22 Sacrum Mid;Upper Stage 2 -  Partial thickness loss of dermis presenting as a shallow open injury with a red, pink wound bed without slough. (Active)  10/12/22 0200  Location: Sacrum  Location Orientation: Mid;Upper  Staging: Stage 2 -  Partial thickness loss of dermis presenting as a shallow open injury with a red, pink wound bed without slough.  Wound Description (Comments):   Present on Admission: Yes    DVT Prophylaxis: Lovenox Code Status: Full code Family Communication: No family at bedside Disposition Plan: To be determined  Status is: Inpatient Remains inpatient appropriate because: Acute respiratory failure, anoxic brain injury, seizure activities      Medications: Scheduled:  Chlorhexidine Gluconate Cloth  6 each Topical Daily   clonazepam  1 mg Oral BID   enoxaparin (LOVENOX) injection  40 mg Subcutaneous Q24H  famotidine  20 mg Per Tube QHS   feeding supplement (PROSource TF20)  60 mL Per Tube BID   fiber supplement (BANATROL TF)  60 mL Per Tube BID   fluconazole  100 mg Per Tube Daily   free water  200 mL Per Tube Q6H   gabapentin  100 mg Per Tube Q8H   guaiFENesin  400 mg Per Tube Q8H   leptospermum manuka honey  1 Application Topical QHS    levETIRAcetam  1,500 mg Per Tube BID   nutrition supplement (JUVEN)  1 packet Per Tube BID BM   mouth rinse  15 mL Mouth Rinse 4 times per day   propranolol  40 mg Per Tube Q8H   scopolamine  1 patch Transdermal Q72H   tobramycin (PF)  300 mg Nebulization BID   valproic acid  500 mg Per Tube Q8H   Continuous:  sodium chloride 250 mL (12/13/22 0041)   feeding supplement (OSMOLITE 1.5 CAL) 70 mL/hr at 12/15/22 0704   piperacillin-tazobactam (ZOSYN)  IV 12.5 mL/hr at 12/15/22 0704   [id number] (TYLENOL) oral liquid 160 mg/5 mL **OR** acetaminophen, ipratropium-albuterol, metoprolol tartrate, ondansetron **OR** ondansetron (ZOFRAN) IV, mouth rinse, oxyCODONE  Antibiotics: Anti-infectives (From admission, onward)    Start     Dose/Rate Route Frequency Ordered Stop   12/13/22 1200  piperacillin-tazobactam (ZOSYN) IVPB 3.375 g        3.375 g 12.5 mL/hr over 240 Minutes Intravenous Every 8 hours 12/13/22 1003 12/16/22 2359   12/13/22 1100  tobramycin (PF) (TOBI) nebulizer solution 300 mg        300 mg Nebulization 2 times daily 12/13/22 1010     12/12/22 2130  fluconazole (DIFLUCAN) 40 MG/ML suspension 100 mg  Status:  Discontinued        100 mg Oral Daily 12/12/22 2040 12/12/22 2043   12/12/22 2130  fluconazole (DIFLUCAN) tablet 100 mg        100 mg Per Tube Daily 12/12/22 2043 12/19/22 0959   12/11/22 0930  levofloxacin (LEVAQUIN) IVPB 750 mg        750 mg 100 mL/hr over 90 Minutes Intravenous Every 24 hours 12/11/22 0830 12/13/22 1246   12/03/22 1115  piperacillin-tazobactam (ZOSYN) IVPB 3.375 g        3.375 g 12.5 mL/hr over 240 Minutes Intravenous Every 8 hours 12/03/22 1018 12/13/22 0746   12/02/22 1245  ceFEPIme (MAXIPIME) 2 g in sodium chloride 0.9 % 100 mL IVPB  Status:  Discontinued        2 g 200 mL/hr over 30 Minutes Intravenous Every 8 hours 12/02/22 1145 12/03/22 0940   11/05/22 0900  ceFEPIme (MAXIPIME) 2 g in sodium chloride 0.9 % 100 mL IVPB        2 g 200 mL/hr  over 30 Minutes Intravenous Every 8 hours 11/05/22 0804 11/12/22 0050   11/01/22 1215  ceFAZolin (ANCEF) IVPB 2g/100 mL premix        2 g 200 mL/hr over 30 Minutes Intravenous  Once 11/01/22 0851 11/01/22 1232   10/14/22 1000  piperacillin-tazobactam (ZOSYN) IVPB 3.375 g        3.375 g 12.5 mL/hr over 240 Minutes Intravenous Every 8 hours 10/14/22 0951 10/21/22 0556   10/04/22 2200  cefTRIAXone (ROCEPHIN) 2 g in sodium chloride 0.9 % 100 mL IVPB  Status:  Discontinued        2 g 200 mL/hr over 30 Minutes Intravenous Daily at bedtime 10/04/22 0914 10/10/22 0900   10/04/22 1045  oseltamivir (TAMIFLU) 6 MG/ML suspension 75 mg        75 mg Per Tube 2 times daily 10/04/22 0950 10/08/22 2229   10/04/22 0600  cefTRIAXone (ROCEPHIN) 2 g in sodium chloride 0.9 % 100 mL IVPB  Status:  Discontinued        2 g 200 mL/hr over 30 Minutes Intravenous Every 24 hours 10/03/22 1054 10/03/22 1250   10/04/22 0600  azithromycin (ZITHROMAX) 500 mg in sodium chloride 0.9 % 250 mL IVPB  Status:  Discontinued        500 mg 250 mL/hr over 60 Minutes Intravenous Every 24 hours 10/03/22 1054 10/03/22 1250   10/03/22 2200  cefTRIAXone (ROCEPHIN) 1 g in sodium chloride 0.9 % 100 mL IVPB  Status:  Discontinued        1 g 200 mL/hr over 30 Minutes Intravenous Daily at bedtime 10/03/22 1559 10/04/22 0914   10/03/22 2200  metroNIDAZOLE (FLAGYL) IVPB 500 mg  Status:  Discontinued        500 mg 100 mL/hr over 60 Minutes Intravenous Every 12 hours 10/03/22 1559 10/05/22 0812   10/03/22 1400  piperacillin-tazobactam (ZOSYN) IVPB 3.375 g  Status:  Discontinued        3.375 g 12.5 mL/hr over 240 Minutes Intravenous Every 8 hours 10/03/22 1258 10/03/22 1559   10/03/22 0515  cefTRIAXone (ROCEPHIN) 1 g in sodium chloride 0.9 % 100 mL IVPB        1 g 200 mL/hr over 30 Minutes Intravenous  Once 10/03/22 0503 10/03/22 0732   10/03/22 0515  azithromycin (ZITHROMAX) 500 mg in sodium chloride 0.9 % 250 mL IVPB        500 mg 250  mL/hr over 60 Minutes Intravenous  Once 10/03/22 0503 10/03/22 0732       Objective:  Vital Signs  Vitals:   12/15/22 0500 12/15/22 0600 12/15/22 0700 12/15/22 0754  BP: 121/88 (!) 129/99 (!) 138/108   Pulse: (!) 118 (!) 108 (!) 108 (!) 115  Resp: (!) 26 (!) 24 (!) 29 (!) 24  Temp:      TempSrc:      SpO2: 98% 97% 98% 94%  Weight:      Height:        Intake/Output Summary (Last 24 hours) at 12/15/2022 0822 Last data filed at 12/15/2022 0704 Gross per 24 hour  Intake 1863.64 ml  Output 1435 ml  Net 428.64 ml    Filed Weights   12/13/22 0529 12/14/22 0702 12/15/22 0405  Weight: 55.3 kg 60.3 kg 59 kg    General appearance: Remains unresponsive.  Eyes are open. Resp: Mildly tachypneic.  Coarse breath sounds bilaterally.  Crackles at the bases.  No wheezing or rhonchi. Cardio: S1-S2 is tachycardic regular.  No S3-S4.  No rubs murmurs or bruit GI: Abdomen is soft.  Nontender nondistended.  Bowel sounds are present normal.  No masses organomegaly PEG tube is noted.  Lab Results:  Data Reviewed: I have personally reviewed following labs and reports of the imaging studies  CBC: Recent Labs  Lab 12/09/22 0157 12/10/22 1430 12/11/22 0200 12/12/22 0448 12/14/22 0732  WBC 9.6 10.8* 11.0* 12.8* 12.7*  NEUTROABS 5.9 5.6 6.8 7.1 8.0*  HGB 11.3* 11.5* 11.4* 11.4* 11.5*  HCT 35.6* 35.7* 36.3* 35.1* 37.0*  MCV 89.0 89.3 91.0 87.8 90.5  PLT 262 284 278 304 279     Basic Metabolic Panel: Recent Labs  Lab 12/09/22 0157 12/10/22 1430 12/11/22 0200 12/12/22 0448 12/14/22 0732  NA  139 135 137 134* 134*  K 4.1 4.3 4.0 4.4 4.5  CL 110 101 102 100 103  CO2 23 23 21* 23 24  GLUCOSE 109* 133* 140* 146* 114*  BUN 33* 37* 42* 35* 29*  CREATININE 0.63 0.55* 0.61 0.61 0.68  CALCIUM 8.8* 9.4 9.1 9.1 9.2  MG 2.2 2.1 2.2 2.2 2.1  PHOS 3.3 4.1 4.6 3.8 4.8*     GFR: Estimated Creatinine Clearance: 81.9 mL/min (by C-G formula based on SCr of 0.68 mg/dL).  Liver Function  Tests: Recent Labs  Lab 12/09/22 0157 12/10/22 1430 12/11/22 0200 12/12/22 0448 12/14/22 0732  AST '24 25 24 31 27  '$ ALT 30 32 33 32 36  ALKPHOS 73 74 75 81 65  BILITOT 0.5 0.3 0.4 0.5 0.4  PROT 6.7 6.9 6.9 7.0 7.0  ALBUMIN 2.2* 2.3* 2.3* 2.2* 2.3*      Recent Labs  Lab 12/10/22 1430  AMMONIA 38*      CBG: Recent Labs  Lab 12/14/22 1212 12/14/22 1630 12/14/22 2045 12/15/22 0031 12/15/22 0417  GLUCAP 104* 94 107* 122* 116*      Recent Results (from the past 240 hour(s))  Culture, blood (Routine X 2) w Reflex to ID Panel     Status: None   Collection Time: 12/05/22  2:47 PM   Specimen: BLOOD RIGHT ARM  Result Value Ref Range Status   Specimen Description BLOOD RIGHT ARM  Final   Special Requests   Final    BOTTLES DRAWN AEROBIC AND ANAEROBIC Blood Culture results may not be optimal due to an excessive volume of blood received in culture bottles   Culture   Final    NO GROWTH 5 DAYS Performed at [facility], [place] [address], [place], [place] [address]    Report Status 12/10/2022 FINAL  Final  Culture, blood (Routine X 2) w Reflex to ID Panel     Status: None   Collection Time: 12/05/22  2:47 PM   Specimen: BLOOD RIGHT HAND  Result Value Ref Range Status   Specimen Description BLOOD RIGHT HAND  Final   Special Requests   Final    BOTTLES DRAWN AEROBIC AND ANAEROBIC Blood Culture results may not be optimal due to an excessive volume of blood received in culture bottles   Culture   Final    NO GROWTH 5 DAYS Performed at [facility], [place] [address], [place], [place] [address]    Report Status 12/10/2022 FINAL  Final  Culture, Respiratory w Gram Stain     Status: None   Collection Time: 12/09/22  4:45 PM   Specimen: Tracheal Aspirate; Respiratory  Result Value Ref Range Status   Specimen Description TRACHEAL ASPIRATE  Final   Special Requests NONE  Final   Gram Stain   Final    MODERATE WBC PRESENT,BOTH PMN AND MONONUCLEAR RARE GRAM NEGATIVE  RODS Performed at [facility], [place] [address], [place], [place] [address]    Culture FEW PSEUDOMONAS AERUGINOSA  Final   Report Status 12/12/2022 FINAL  Final   Organism ID, Bacteria PSEUDOMONAS AERUGINOSA  Final      Susceptibility   Pseudomonas aeruginosa - MIC*    CEFTAZIDIME 4 SENSITIVE Sensitive     CIPROFLOXACIN <=0.25 SENSITIVE Sensitive     GENTAMICIN <=1 SENSITIVE Sensitive     IMIPENEM >=16 RESISTANT Resistant     PIP/TAZO 16 SENSITIVE Sensitive     CEFEPIME 4 SENSITIVE Sensitive     *  FEW PSEUDOMONAS AERUGINOSA  MRSA Next Gen by PCR, Nasal     Status: None   Collection Time: 12/09/22  5:25 PM   Specimen: Nasal Mucosa; Nasal Swab  Result Value Ref Range Status   MRSA by PCR Next Gen NOT DETECTED NOT DETECTED Final    Comment: (NOTE) The GeneXpert MRSA Assay (FDA approved for NASAL specimens only), is one component of a comprehensive MRSA colonization surveillance program. It is not intended to diagnose MRSA infection nor to guide or monitor treatment for MRSA infections. Test performance is not FDA approved in patients less than 60 years old. Performed at [facility], [place] [address], [place], [place] [address]   Culture, Respiratory w Gram Stain     Status: None   Collection Time: 12/11/22  8:29 AM   Specimen: Tracheal Aspirate; Respiratory  Result Value Ref Range Status   Specimen Description TRACHEAL ASPIRATE  Final   Special Requests NONE  Final   Gram Stain   Final    ABUNDANT WBC PRESENT, PREDOMINANTLY PMN NO ORGANISMS SEEN Performed at [facility], [address] [place], [place] [address]    Culture FEW PSEUDOMONAS AERUGINOSA  Final   Report Status 12/13/2022 FINAL  Final   Organism ID, Bacteria PSEUDOMONAS AERUGINOSA  Final      Susceptibility   Pseudomonas aeruginosa - MIC*    CEFTAZIDIME 4 SENSITIVE Sensitive     CIPROFLOXACIN <=0.25 SENSITIVE Sensitive     GENTAMICIN <=1 SENSITIVE Sensitive     IMIPENEM >=16 RESISTANT  Resistant     PIP/TAZO 16 SENSITIVE Sensitive     CEFEPIME 2 SENSITIVE Sensitive     * FEW PSEUDOMONAS AERUGINOSA      Radiology Studies: No results found.     LOS: 76 days   Madeliene Tejera [employer] on [email or web address]  12/15/2022, 8:22 AM

## 2022-12-15 NOTE — Progress Notes (Signed)
0800 Tobramycin dose not available for morning treatment. Will attempt again next round.

## 2022-12-16 DIAGNOSIS — J9601 Acute respiratory failure with hypoxia: Secondary | ICD-10-CM | POA: Diagnosis not present

## 2022-12-16 DIAGNOSIS — Z930 Tracheostomy status: Secondary | ICD-10-CM | POA: Diagnosis not present

## 2022-12-16 DIAGNOSIS — J151 Pneumonia due to Pseudomonas: Secondary | ICD-10-CM | POA: Diagnosis not present

## 2022-12-16 LAB — CBC
HCT: 36.2 % — ABNORMAL LOW (ref 39.0–52.0)
Hemoglobin: 11.6 g/dL — ABNORMAL LOW (ref 13.0–17.0)
MCH: 28.3 pg (ref 26.0–34.0)
MCHC: 32 g/dL (ref 30.0–36.0)
MCV: 88.3 fL (ref 80.0–100.0)
Platelets: 309 10*3/uL (ref 150–400)
RBC: 4.1 MIL/uL — ABNORMAL LOW (ref 4.22–5.81)
RDW: 15.7 % — ABNORMAL HIGH (ref 11.5–15.5)
WBC: 14.7 10*3/uL — ABNORMAL HIGH (ref 4.0–10.5)
nRBC: 0.2 % (ref 0.0–0.2)

## 2022-12-16 LAB — BASIC METABOLIC PANEL
Anion gap: 8 (ref 5–15)
BUN: 38 mg/dL — ABNORMAL HIGH (ref 6–20)
CO2: 22 mmol/L (ref 22–32)
Calcium: 8.8 mg/dL — ABNORMAL LOW (ref 8.9–10.3)
Chloride: 104 mmol/L (ref 98–111)
Creatinine, Ser: 0.58 mg/dL — ABNORMAL LOW (ref 0.61–1.24)
GFR, Estimated: >60 mL/min (ref >60)
Glucose, Bld: 126 mg/dL — ABNORMAL HIGH (ref 70–99)
Potassium: 4.5 mmol/L (ref 3.5–5.1)
Sodium: 134 mmol/L — ABNORMAL LOW (ref 135–145)

## 2022-12-16 LAB — GLUCOSE, CAPILLARY
Glucose-Capillary: 142 mg/dL — ABNORMAL HIGH (ref 70–99)
Glucose-Capillary: 170 mg/dL — ABNORMAL HIGH (ref 70–99)

## 2022-12-16 LAB — MAGNESIUM: Magnesium: 2.2 mg/dL (ref 1.7–2.4)

## 2022-12-16 MED ORDER — PROPRANOLOL HCL 60 MG PO TABS
60.0000 mg | ORAL_TABLET | Freq: Three times a day (TID) | ORAL | Status: DC
  Administered 2022-12-16 – 2022-12-20 (×12): 60 mg
  Filled 2022-12-16: qty 6
  Filled 2022-12-16 (×15): qty 1

## 2022-12-16 MED ORDER — LIP MEDEX EX OINT
TOPICAL_OINTMENT | CUTANEOUS | Status: DC | PRN
  Administered 2022-12-19 – 2023-01-12 (×3): 75 via TOPICAL
  Filled 2022-12-16: qty 7

## 2022-12-16 NOTE — Progress Notes (Signed)
[facility] PROGRESS NOTE   Damon [name] [id number] DOB: [birth year] DOA: 10/03/2022  PCP: [facility]  Brief History: Damon [name] is a 62 y.o. male with a history of HTN, HLD, tobacco use, chronic back pain who presented to the [facility] on 10/03/2022 with influenza A. He's had a protracted hospitalization summarized below.   Significant Events 12/18 Flu A positive 12/20 Admitted, bipap, 5 minute code MRSA PCR - neg Urine strep _POS 12/21 Intubated, paralyzed, ARDS protocol, on 7cc/kg 12/22 Weaned to 6 cc/kg with dyssynchrony, back on 7 cc/kg, driving pressures good, weaning vent 12/23 Attempt to wean sedation again with dyssynchrony and desaturation.  Some concern for cuff leak, tube exchange.  Cuff did not appear to be blown.  Ongoing signs of cuff leak, query leaking around cough, possible large trachea, diuresing 12/25 tolerating PSV, myoclonus> ceribell, Neuro consult, changed to propofol, diuresed  12/26 Changed to DNR. On Propofol, versed.  Tolerating PSV.  MRI brain with hypoxic/anoxic injury Blood culture neg 12/27 GPD's on EEG, pending transfer to [facility] for cEEG NEURO CONSULT 12/28 moved to [facility] for LTM EEG 12/31 family updated by neuro: no chance for meaningful recovery, they should make plans for comfort measures 1/1 cultures - resp normal flora 1/2 eeg still with epileptogenicity. Palli fam mtg -- family does not want to hear from medical teams unless there is "good news"  1/3  cEEG with burst suppression w highly epileptiform bursts.  1/4 cEEG with burst suppression  + highly epileptiform bursts still.  WBC slightly up to 22.5. Temp high 99 low 100  c-EEG stopped duie to lack of benefit Pallative care consult - wife upset about early dc from ER on 12/18 Chaplain consult: Wife is sufering from anticipatory grief + accepting god's way + curious/anger about events leading upto current state EEG: generalized epileptogenicity with high potential for  seizures as well as profound diffuse encephalopathy. In the setting of cardiac arrest, this EEG pattern is suggestive of anoxic/hypoxic brain injury.  1/5 - Low grade fever +. [place] 22.5K.  on Zosyn. On vent fio2 30%. TF +. Per RN - diprivan stopped yesterday. No myoclonus today. Mild tachycardia + Rpt pall care on 10/21/22 per wife 1/6 - On vent FiO2 30%.  Tmax 99,54F ., WBC better. Still off diprivan -> No myoclonus. cXR visualized - devices in place and faiure clear Oral oxy and klonpin stopped 1/8 No significant changes in neuro status. Tachycardic to 130s, hypertensive to 140s. Coreg transitioned to metoprolol. 1/11 wife considering trach/peg, to discuss with surgery-discussed with Dr. Bobbye Morton 1/12 more tachycardic 1/16-spoke with wife at length at bedside, Dr. Hulen Skains also had a long conversation with spouse-desires to continue current aggressive care and desires PEG and trach placed 1/18 perc trach and peg 11/05/2022 transferred to floor due to need for ICU bed 11/08/2022 transition to [facility] with pulmonary following weekly for trach 2/4 recurrent fever, cultures collected. 2/5 changed to cuffless trach 2/18 antibiotics started for Pseudomonas on tracheal aspirate cultre 2/22 concern for myoclonic seizure, neuro reconsulted 2/26 transferred to ICU for secretions 3/1: Respiratory status has improved.  Okay for transfer back to progressive floor.    Subjective/Interval History: Patient noted to be lying on the bed with eyes open.  Unresponsive for the most part.  Assessment/Plan:  Goals of Care IPAL from 1/24 reviewed. He had been DNR, but transitioned back to full code. - Remains full code per extensive discussions with patient's spouse by different providers.    Anoxic  brain injury Myoclonic Seizure activity  Neurology discussed with family on 12/31. "Has sustained irreversible anoxic brain injury that is catastrophic." Family was encouraged to consider comfort  measures.  Signed off 12/31. EEG 2/22 consistent with anoxic/hypoxic brain injury.  EEG changes c/w myoclonic seizure. Neurology to evaluate, appreciate assistance (2/24 last note).  - leg movements on 2/23 did not correlate with seizure activity - Continue keppra, depakene - added clonazepam BID (if causing sedation, can discontinue per neurology 2/24 note).  Dose of Klonopin was decreased on 2/27.  Continue current dose for now.   - LTM d/c'd by neurology on 2/24 No improvement noted in the last several days.  Remains unresponsive.   Acute hypoxic respiratory failure, ARDS due to influenza A (+12/18) and superimposed Pneumococcal pneumonia: - s/p tamiflu 12/21-12/25 and ceftriaxone 12/20 - 12/26. Then had zosyn x7 days starting 12/31 per report. Tolerated this well.  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 Appreciate PCCM involvement for management. Site appears stable, oxygenation stable. - Having frequent suctioning of white secretions - repeat cxr (increasing right mid lower lung zone opacity, atelectasis vs pneumonia).  Patient was transferred back to the ICU due to significant secretions.  He underwent chest physiotherapy.   Cleared by pulmonology for transfer out of the ICU.   Pseudomonas pneumonia vs. tracheitis:  - culture 2/17 pseudomonas resistant to imipenem - repeat cultures pending 2/25 with pseudomonas, sensitivities reviewed.   - repeat cultures from 2/27 sensitivities reviewed. Patient remains on Zosyn.  Levaquin was added for a few days but then discontinued by pulmonology.  They recommend 10 days of treatment with Zosyn.  Course will be completed this evening. Patient also on tobramycin nebulizer treatments.   PEA arrest:  - Continue cardiac monitoring, electrolyte monitoring.    Recurrent Fevers  Leukocytosis Intermittent fevers throughout hospital stay.  Has received multiple antibiotic courses.   See discussions  above. Stable currently. Increase in WBC noted today.  Will recheck in 48 hours.  Will recheck sooner if he becomes febrile.  Paroxysmal sympathetic hyperactivity Causing sinus tachycardia, hypotension.  - Continue propranolol at '40mg'$  q8h dose and neurontin.  - continue metoprolol prn for tachycardia  Heart rate noted to be higher than usual over the last 24 hours.  He is afebrile.  No significant secretions according to nursing staff.  Diarrhea appears to be subsiding.  Does not appear to be in any discomfort or distress.  Will increase the dose of propranolol and continue to monitor.  Constipation Laxatives on hold due to frequent BMs.   Elevated LFT's LFTs were noted to be normal as of 12/12/2022.   Hypotension Blood pressure stable for the most part.    Severe protein calorie malnutrition:  - Continue tube feeds thru PEG placed 11/01/2022.   Prediabetes HbA1c 5.9% on 12/20.  - SSI q4h    Hypernatremia Mild, resolved with increased free water.  Sodium level is now in the hyponatremia range.  Dose of free water was decreased.  Sodium level is stable.  Continue free water at current dose.   Diarrhea - will monitor for now, holding laxatives Seems to have slowed down.   Pressure Ulcers Pressure Injury 10/12/22 Sacrum Mid;Upper Stage 2 -  Partial thickness loss of dermis presenting as a shallow open injury with a red, pink wound bed without slough. (Active)  10/12/22 0200  Location: Sacrum  Location Orientation: Mid;Upper  Staging: Stage 2 -  Partial thickness loss of dermis presenting as a shallow open  injury with a red, pink wound bed without slough.  Wound Description (Comments):   Present on Admission: Yes    DVT Prophylaxis: Lovenox Code Status: Full code Family Communication: No family at bedside Disposition Plan: To be determined  Status is: Inpatient Remains inpatient appropriate because: Acute respiratory failure, anoxic brain injury, seizure  activities      Medications: Scheduled:  Chlorhexidine Gluconate Cloth  6 each Topical Daily   clonazepam  1 mg Oral BID   enoxaparin (LOVENOX) injection  40 mg Subcutaneous Q24H   famotidine  20 mg Per Tube QHS   feeding supplement (PROSource TF20)  60 mL Per Tube BID   fiber supplement (BANATROL TF)  60 mL Per Tube BID   fluconazole  100 mg Per Tube Daily   free water  200 mL Per Tube Q6H   gabapentin  100 mg Per Tube Q8H   guaiFENesin  400 mg Per Tube Q8H   leptospermum manuka honey  1 Application Topical QHS   levETIRAcetam  1,500 mg Per Tube BID   nutrition supplement (JUVEN)  1 packet Per Tube BID BM   mouth rinse  15 mL Mouth Rinse 4 times per day   propranolol  60 mg Per Tube Q8H   scopolamine  1 patch Transdermal Q72H   tobramycin (PF)  300 mg Nebulization BID   valproic acid  500 mg Per Tube Q8H   Continuous:  sodium chloride 250 mL (12/13/22 0041)   feeding supplement (OSMOLITE 1.5 CAL) 1,000 mL (12/16/22 0422)   piperacillin-tazobactam (ZOSYN)  IV 3.375 g (12/16/22 0418)   [id number] (TYLENOL) oral liquid 160 mg/5 mL **OR** acetaminophen, ipratropium-albuterol, metoprolol tartrate, ondansetron **OR** ondansetron (ZOFRAN) IV, mouth rinse, oxyCODONE  Antibiotics: Anti-infectives (From admission, onward)    Start     Dose/Rate Route Frequency Ordered Stop   12/13/22 1200  piperacillin-tazobactam (ZOSYN) IVPB 3.375 g        3.375 g 12.5 mL/hr over 240 Minutes Intravenous Every 8 hours 12/13/22 1003 12/16/22 2359   12/13/22 1100  tobramycin (PF) (TOBI) nebulizer solution 300 mg        300 mg Nebulization 2 times daily 12/13/22 1010     12/12/22 2130  fluconazole (DIFLUCAN) 40 MG/ML suspension 100 mg  Status:  Discontinued        100 mg Oral Daily 12/12/22 2040 12/12/22 2043   12/12/22 2130  fluconazole (DIFLUCAN) tablet 100 mg        100 mg Per Tube Daily 12/12/22 2043 12/19/22 0959   12/11/22 0930  levofloxacin (LEVAQUIN) IVPB 750 mg        750 mg 100  mL/hr over 90 Minutes Intravenous Every 24 hours 12/11/22 0830 12/13/22 1246   12/03/22 1115  piperacillin-tazobactam (ZOSYN) IVPB 3.375 g        3.375 g 12.5 mL/hr over 240 Minutes Intravenous Every 8 hours 12/03/22 1018 12/13/22 0746   12/02/22 1245  ceFEPIme (MAXIPIME) 2 g in sodium chloride 0.9 % 100 mL IVPB  Status:  Discontinued        2 g 200 mL/hr over 30 Minutes Intravenous Every 8 hours 12/02/22 1145 12/03/22 0940   11/05/22 0900  ceFEPIme (MAXIPIME) 2 g in sodium chloride 0.9 % 100 mL IVPB        2 g 200 mL/hr over 30 Minutes Intravenous Every 8 hours 11/05/22 0804 11/12/22 0050   11/01/22 1215  ceFAZolin (ANCEF) IVPB 2g/100 mL premix        2 g 200 mL/hr  over 30 Minutes Intravenous  Once 11/01/22 0851 11/01/22 1232   10/14/22 1000  piperacillin-tazobactam (ZOSYN) IVPB 3.375 g        3.375 g 12.5 mL/hr over 240 Minutes Intravenous Every 8 hours 10/14/22 0951 10/21/22 0556   10/04/22 2200  cefTRIAXone (ROCEPHIN) 2 g in sodium chloride 0.9 % 100 mL IVPB  Status:  Discontinued        2 g 200 mL/hr over 30 Minutes Intravenous Daily at bedtime 10/04/22 0914 10/10/22 0900   10/04/22 1045  oseltamivir (TAMIFLU) 6 MG/ML suspension 75 mg        75 mg Per Tube 2 times daily 10/04/22 0950 10/08/22 2229   10/04/22 0600  cefTRIAXone (ROCEPHIN) 2 g in sodium chloride 0.9 % 100 mL IVPB  Status:  Discontinued        2 g 200 mL/hr over 30 Minutes Intravenous Every 24 hours 10/03/22 1054 10/03/22 1250   10/04/22 0600  azithromycin (ZITHROMAX) 500 mg in sodium chloride 0.9 % 250 mL IVPB  Status:  Discontinued        500 mg 250 mL/hr over 60 Minutes Intravenous Every 24 hours 10/03/22 1054 10/03/22 1250   10/03/22 2200  cefTRIAXone (ROCEPHIN) 1 g in sodium chloride 0.9 % 100 mL IVPB  Status:  Discontinued        1 g 200 mL/hr over 30 Minutes Intravenous Daily at bedtime 10/03/22 1559 10/04/22 0914   10/03/22 2200  metroNIDAZOLE (FLAGYL) IVPB 500 mg  Status:  Discontinued        500 mg 100  mL/hr over 60 Minutes Intravenous Every 12 hours 10/03/22 1559 10/05/22 0812   10/03/22 1400  piperacillin-tazobactam (ZOSYN) IVPB 3.375 g  Status:  Discontinued        3.375 g 12.5 mL/hr over 240 Minutes Intravenous Every 8 hours 10/03/22 1258 10/03/22 1559   10/03/22 0515  cefTRIAXone (ROCEPHIN) 1 g in sodium chloride 0.9 % 100 mL IVPB        1 g 200 mL/hr over 30 Minutes Intravenous  Once 10/03/22 0503 10/03/22 0732   10/03/22 0515  azithromycin (ZITHROMAX) 500 mg in sodium chloride 0.9 % 250 mL IVPB        500 mg 250 mL/hr over 60 Minutes Intravenous  Once 10/03/22 0503 10/03/22 0732       Objective:  Vital Signs  Vitals:   12/16/22 0600 12/16/22 0700 12/16/22 0752 12/16/22 0825  BP: (!) 143/94 (!) 151/104 (!) 132/90   Pulse: (!) 131 (!) 118 (!) 119   Resp: (!) 27 (!) 29 (!) 38   Temp:    99.1 F (37.3 C)  TempSrc:    Axillary  SpO2: 98% 98% 100%   Weight:      Height:        Intake/Output Summary (Last 24 hours) at 12/16/2022 0832 Last data filed at 12/16/2022 0700 Gross per 24 hour  Intake 1779.3 ml  Output 1828 ml  Net -48.7 ml    Filed Weights   12/14/22 0702 12/15/22 0405 12/16/22 0500  Weight: 60.3 kg 59 kg 60.3 kg    General appearance: Remains unresponsive Resp: Mildly tachypneic.  Coarse breath sounds bilaterally with few crackles at the bases.  No wheezing or rhonchi. Cardio: S1-S2 is tachycardic regular.  No S3-S4.  No rubs murmurs or bruit GI: Abdomen is soft.  PEG tube is noted  Lab Results:  Data Reviewed: I have personally reviewed following labs and reports of the imaging studies  CBC: Recent Labs  Lab 12/10/22 1430 12/11/22 0200 12/12/22 0448 12/14/22 0732 12/16/22 0221  WBC 10.8* 11.0* 12.8* 12.7* 14.7*  NEUTROABS 5.6 6.8 7.1 8.0*  --   HGB 11.5* 11.4* 11.4* 11.5* 11.6*  HCT 35.7* 36.3* 35.1* 37.0* 36.2*  MCV 89.3 91.0 87.8 90.5 88.3  PLT 284 278 304 279 309     Basic Metabolic Panel: Recent Labs  Lab 12/10/22 1430  12/11/22 0200 12/12/22 0448 12/14/22 0732 12/16/22 0221  NA 135 137 134* 134* 134*  K 4.3 4.0 4.4 4.5 4.5  CL 101 102 100 103 104  CO2 23 21* '23 24 22  '$ GLUCOSE 133* 140* 146* 114* 126*  BUN 37* 42* 35* 29* 38*  CREATININE 0.55* 0.61 0.61 0.68 0.58*  CALCIUM 9.4 9.1 9.1 9.2 8.8*  MG 2.1 2.2 2.2 2.1 2.2  PHOS 4.1 4.6 3.8 4.8*  --      GFR: Estimated Creatinine Clearance: 82.2 mL/min (A) (by C-G formula based on SCr of 0.58 mg/dL (L)).  Liver Function Tests: Recent Labs  Lab 12/10/22 1430 12/11/22 0200 12/12/22 0448 12/14/22 0732  AST '25 24 31 27  '$ ALT 32 33 32 36  ALKPHOS 74 75 81 65  BILITOT 0.3 0.4 0.5 0.4  PROT 6.9 6.9 7.0 7.0  ALBUMIN 2.3* 2.3* 2.2* 2.3*      Recent Labs  Lab 12/10/22 1430  AMMONIA 38*      CBG: Recent Labs  Lab 12/15/22 1153 12/15/22 1609 12/15/22 2015 12/15/22 2309 12/16/22 0824  GLUCAP 153* 124* 122* 132* 142*      Recent Results (from the past 240 hour(s))  Culture, Respiratory w Gram Stain     Status: None   Collection Time: 12/09/22  4:45 PM   Specimen: Tracheal Aspirate; Respiratory  Result Value Ref Range Status   Specimen Description TRACHEAL ASPIRATE  Final   Special Requests NONE  Final   Gram Stain   Final    MODERATE WBC PRESENT,BOTH PMN AND MONONUCLEAR RARE GRAM NEGATIVE RODS Performed at [facility], [place] [address] [place], [place] [address]    Culture FEW PSEUDOMONAS AERUGINOSA  Final   Report Status 12/12/2022 FINAL  Final   Organism ID, Bacteria PSEUDOMONAS AERUGINOSA  Final      Susceptibility   Pseudomonas aeruginosa - MIC*    CEFTAZIDIME 4 SENSITIVE Sensitive     CIPROFLOXACIN <=0.25 SENSITIVE Sensitive     GENTAMICIN <=1 SENSITIVE Sensitive     IMIPENEM >=16 RESISTANT Resistant     PIP/TAZO 16 SENSITIVE Sensitive     CEFEPIME 4 SENSITIVE Sensitive     * FEW PSEUDOMONAS AERUGINOSA  MRSA Next Gen by PCR, Nasal     Status: None   Collection Time: 12/09/22  5:25 PM   Specimen: Nasal  Mucosa; Nasal Swab  Result Value Ref Range Status   MRSA by PCR Next Gen NOT DETECTED NOT DETECTED Final    Comment: (NOTE) The GeneXpert MRSA Assay (FDA approved for NASAL specimens only), is one component of a comprehensive MRSA colonization surveillance program. It is not intended to diagnose MRSA infection nor to guide or monitor treatment for MRSA infections. Test performance is not FDA approved in patients less than 90 years old. Performed at [facility], [place] [address] [place], [place] [address]   Culture, Respiratory w Gram Stain     Status: None   Collection Time: 12/11/22  8:29 AM   Specimen: Tracheal Aspirate; Respiratory  Result Value Ref Range Status   Specimen Description  TRACHEAL ASPIRATE  Final   Special Requests NONE  Final   Gram Stain   Final    ABUNDANT WBC PRESENT, PREDOMINANTLY PMN NO ORGANISMS SEEN Performed at [facility], [place] [address], [place], [place] [address]    Culture FEW PSEUDOMONAS AERUGINOSA  Final   Report Status 12/13/2022 FINAL  Final   Organism ID, Bacteria PSEUDOMONAS AERUGINOSA  Final      Susceptibility   Pseudomonas aeruginosa - MIC*    CEFTAZIDIME 4 SENSITIVE Sensitive     CIPROFLOXACIN <=0.25 SENSITIVE Sensitive     GENTAMICIN <=1 SENSITIVE Sensitive     IMIPENEM >=16 RESISTANT Resistant     PIP/TAZO 16 SENSITIVE Sensitive     CEFEPIME 2 SENSITIVE Sensitive     * FEW PSEUDOMONAS AERUGINOSA      Radiology Studies: No results found.     LOS: 60 days   Hazely Sealey [employer] on [email or web address]  12/16/2022, 8:32 AM

## 2022-12-17 ENCOUNTER — Inpatient Hospital Stay (HOSPITAL_COMMUNITY): Payer: Medicare Other

## 2022-12-17 DIAGNOSIS — R9431 Abnormal electrocardiogram [ECG] [EKG]: Secondary | ICD-10-CM

## 2022-12-17 DIAGNOSIS — R000 Tachycardia, unspecified: Secondary | ICD-10-CM | POA: Diagnosis not present

## 2022-12-17 DIAGNOSIS — J9601 Acute respiratory failure with hypoxia: Secondary | ICD-10-CM | POA: Diagnosis not present

## 2022-12-17 DIAGNOSIS — J151 Pneumonia due to Pseudomonas: Secondary | ICD-10-CM | POA: Diagnosis not present

## 2022-12-17 LAB — ECHOCARDIOGRAM COMPLETE
Height: 64 in
S' Lateral: 2.7 cm
Weight: 2112 oz

## 2022-12-17 LAB — TSH: TSH: 2.352 u[IU]/mL (ref 0.350–4.500)

## 2022-12-17 LAB — GLUCOSE, CAPILLARY
Glucose-Capillary: 117 mg/dL — ABNORMAL HIGH (ref 70–99)
Glucose-Capillary: 152 mg/dL — ABNORMAL HIGH (ref 70–99)
Glucose-Capillary: 98 mg/dL (ref 70–99)

## 2022-12-17 LAB — T4, FREE: Free T4: 1.39 ng/dL — ABNORMAL HIGH (ref 0.61–1.12)

## 2022-12-17 MED ORDER — MIDODRINE HCL 5 MG PO TABS: 5.0000 mg | ORAL_TABLET | Freq: Three times a day (TID) | ORAL | Status: DC

## 2022-12-17 MED ORDER — MIDODRINE HCL 5 MG PO TABS
5.0000 mg | ORAL_TABLET | Freq: Three times a day (TID) | ORAL | Status: DC
  Administered 2022-12-17 – 2023-01-22 (×109): 5 mg
  Filled 2022-12-17 (×109): qty 1

## 2022-12-17 MED ORDER — CLONAZEPAM 0.25 MG PO TBDP
0.5000 mg | ORAL_TABLET | Freq: Two times a day (BID) | ORAL | Status: DC
  Administered 2022-12-17 – 2022-12-28 (×22): 0.5 mg via ORAL
  Filled 2022-12-17 (×21): qty 2

## 2022-12-17 NOTE — TOC Progression Note (Signed)
Transition of Care [facility]) - Progression Note    Patient Details  Name: Damon [name] MRN: [id number] Date of Birth: 1961-06-23  Transition of [facility] CM/SW [place], [place] Phone Number: 12/17/2022, 4:05 PM  Clinical Narrative:     No current trach/SNF bed offers.CSW following to refax out for possible trach/SNF placement closer to patient being medically ready. CSW will continue to follow and assist with patients dc planning needs.  Expected Discharge Plan:  (TBD)    Expected Discharge Plan and Services In-house Referral: Clinical Social Work Discharge Planning Services: CM Consult Post Acute Care Choice: NA Living arrangements for the past 2 months: [place]                   DME Agency: NA                   Social Determinants of Health (SDOH) Interventions [place]: No Food Insecurity (10/04/2022)  Housing: Low Risk  (10/04/2022)  Transportation Needs: No Transportation Needs (10/04/2022)  Utilities: Not At Risk (10/04/2022)  Tobacco Use: High Risk (11/02/2022)    Readmission Risk Interventions     No data to display

## 2022-12-17 NOTE — Progress Notes (Cosign Needed Addendum)
NAME:  Damon [name], MRN:  [id number], DOB:  [birth year], LOS: 75 ADMISSION DATE:  10/03/2022, CONSULTATION DATE:  10/03/2022 REFERRING MD:  Marylyn Ishihara - [facility] CHIEF COMPLAINT:  Dyspnea   History of Present Illness:  62 year old man admitted to [facility] 12/20 in the setting of acute hypoxemic respiratory failure due to Flu A. PMHx significant for HTN, HLD, chronic back pain, tobacco abuse   Placed on BIPAP.  Coded (likely in the setting of hypoxia), initial rhythm PEA with CPR x 5 minutes, required intubation.  After several days on mechanical ventilation remains comatose, MRI Brain 12/26 worrisome for anoxic brain injury.  Transferred to [facility] for LTM EEG monitoring.  Neurology consulted. LTM with generalized epileptogenicity. Treated with Propofol, Depakote, Keppra, Ketamine. 12/31 Neurology with goals of care with family. Relayed that patient has a grim neurological recovery due to irreversible anoxic injury. Family wished to continue aggressive measures.   Palliative care consulted. Stay complicated with neuro-storming.   Surgical consult regarding PEG/trach.  Pertinent Medical History:   Past Medical History:  Diagnosis Date   Elevated lipids    Hypertension    Tobacco abuse    Significant Hospital Events: Including procedures, antibiotic start and stop dates in addition to other pertinent events   12/18 Flu A positive 12/20 Admitted, 5 minute code after non-compliance with BiPAP. MRSA PCR - neg, Urine strep > POS 12/21 Intubated, paralyzed, ARDS protocol, on 7cc/kg 12/22 Weaned to 6 cc/kg with dyssynchrony, back on 7 cc/kg, driving pressures good, weaning vent 12/23 Attempt to wean sedation again with dyssynchrony and desaturation.  Some concern for cuff leak, tube exchange.  Cuff did not appear to be blown.  Ongoing signs of cuff leak, query leaking around cough, possible large trachea, diuresing 12/25 tolerating PSV, myoclonus> ceribell, Neuro consult, changed to propofol, diuresed  12/26  Changed to DNR. On Propofol, versed.  Tolerating PSV.  MRI brain with hypoxic/anoxic injury. Blood culture neg 12/27 GPD's on EEG, pending transfer to [facility] for cEEG. NEURO CONSULT 12/28 moved to [facility] for LTM EEG 12/31 family updated by neuro: no chance for meaningful recovery, they should make plans for comfort measures 1/1 cultures - resp normal flora 1/2 eeg still with epileptogenicity. Palli fam mtg -- family does not want to hear from medical teams unless there is "good news"  1/3  cEEG with burst suppression w highly epileptiform bursts.  1/4 cEEG with burst suppression  + highly epileptiform bursts still.  WBC slightly up to 22.5. Temp high 99 low 100  c-EEG stopped due to lack of benefit Pallative care consult - wife upset about early dc from ER on 12/18 Chaplain consult: Wife is sufering from anticipatory grief + accepting god's way + curious/anger about events leading upto current state EEG: generalized epileptogenicity with high potential for seizures as well as profound diffuse encephalopathy. In the setting of cardiac arrest, this EEG pattern is suggestive of anoxic/hypoxic brain injury.  1/5 Low grade fever +. [place] 22.5K.  on Zosyn. On vent fio2 30%. TF +. Per RN - diprivan stopped yesterday. No myoclonus today. Mild tachycardia + Rpt pall care on 10/21/22 per wife 1/6 On vent FiO2 30%.  Tmax 99,70F ., WBC better. Still off diprivan -> No myoclonus. cXR visualized - devices in place and faiure clear. Oral oxy and klonpin stopped 1/8 No significant changes in neuro status. Tachycardic to 130s, hypertensive to 140s. Coreg transitioned to metoprolol. 1/11 wife considering trach/peg, to discuss with surgery-discussed with Dr. Bobbye Morton 1/12 more tachycardic 1/16-spoke with  wife at length at bedside, Dr. Hulen Skains also had a long conversation with spouse-desires to continue current aggressive care and desires PEG and trach placed 1/18 perc trach and peg 11/05/2022 transferred to floor due to need for  ICU bed 11/08/2022 transition to [facility] hospitalist service with pulmonary following weekly for trach 2/5 ATC 28%, no acute issues with trach  2/26 Copious tracheal sections in the setting of pseudomonas pneumonia resulting in need to move to ICU for close monitoring/ RT care. 2/29 >> Trach change 12/17/2022>> Waiting on Progressive bed  Interim History / Subjective:  Overnight no acute changes. Remains nonresponsive. Requiring suctioning with his CPT, but not in between per RT. Currently sats are 98%, RR 28, Tachy at 115 On 28% ATC  T max 99.9 >> recurrent low grade fevers throughout hospitalization  Net + 11.7 L Last CXR 2/25>> increasing right mid lower lung zone opacity, atelectasis versus pneumonia. WBC 3/3>> 14.7 from 12.7  Objective:  Blood pressure (!) 129/100, pulse (!) 127, temperature 98.1 F (36.7 C), temperature source Axillary, resp. rate (!) 30, height '5\' 4"'$  (1.626 m), weight 59.9 kg, SpO2 98 %.    FiO2 (%):  [21 %] 21 %   Intake/Output Summary (Last 24 hours) at 12/17/2022 0740 Last data filed at 12/17/2022 0600 Gross per 24 hour  Intake 2610.24 ml  Output 2019 ml  Net 591.24 ml  BP (!) 129/100   Pulse (!) 127   Temp 98.1 F (36.7 C) (Axillary)   Resp (!) 30   Ht '5\' 4"'$  (1.626 m)   Wt 59.9 kg   SpO2 98%   BMI 22.66 kg/m   Filed Weights   12/15/22 0405 12/16/22 0500 12/17/22 0500  Weight: 59 kg 60.3 kg 59.9 kg   Physical Exam: General: chronically ill appearing man lying in bed in NAD, nonresponsive HEENT: temporal wasting, No JVD, No LAD Neck: uncuffed shiley trach in place, Secure,no erythema or drainage around stoma Neuro: eyes intermittently opening, No focus or track, No blink to threat, occasional posturing with stimulation.  CV: S1S2, RRR, ST per tele PULM: Bilateral chest excursion, rhonchi bilaterally, Minimal secretions while I was in room GI: soft, NT, ND, BS +. TF infusing at goal Extremities: muscle wasting, no edema, boots on and in place Skin:  warm, dry, no rashes  Trach aspirates: all pseudomonas with resistance to imipenem  Resolved   Flu A AKI CAP, pneumococcal pneumonia - s/p Zosyn 12/31 - 1/7; Ceftriaxone 12/20- 12/27, Azithro 12/20, Flagyl 12/20 - 12/22  Assessment & Plan:   Anoxic brain injury, ventilator dependent s/p perc trach 1/18 Acute respiratory failure with hypoxia/ARDS secondary to flu and strep (completed rx) Seizures Paroxysmal sympathetic hyperactivity Fever Leukocytosis  Anemia Hyperkalemia Tachycardia/hypotension due to paroxysmal sympathetic hyperactivity Severe protein calorie malnutrition Hyperglycemia Pseudomonas pneumonia RLL  Discussion Tracheostomy, suctioning frequency improving. With poor mental status, he is unlikely to be a candidate for decannulation any time soon.     Plan: Discussed with RT and primary team-- stable to transfer back to the floor. Awaiting Progressive bed -Con't CPT with vest therapy -Adding tobramycin inhaled to reduce colonization of pseudomonas.  Trach tube  changed 2/29 to reduce biofilms.  -CPT with vest, and suction after -levofloxacin stopped since sensitivities have not changed. Needs 2 week course of pip-tazo. -trach care per protocol - Trend CXR prn - Consider Lasix prn -Overall prognosis is guarded with limited improvement in neurologic function.    PCCM will continue to follow intermittently for trach; will see  next on Monday, March 11 unless he needs to be seen sooner.  Magdalen Spatz, MSN, AGACNP-BC [place] for personal pager PCCM on call pager [phone]   12/17/22 7:40 AM [facility]  For contact information, see Amion. If no response to pager, please call PCCM consult pager. After hours, 7PM- 7AM, please call Elink.

## 2022-12-17 NOTE — Progress Notes (Signed)
[facility] PROGRESS NOTE   Damon [name] [id number] DOB: 15-Feb-1961 DOA: 10/03/2022  PCP: [facility]  Brief History: Damon [name] is a 62 y.o. male with a history of HTN, HLD, tobacco use, chronic back pain who presented to the [facility] on 10/03/2022 with influenza A. He's had a protracted hospitalization summarized below.   Significant Events 12/18 Flu A positive 12/20 Admitted, bipap, 5 minute code MRSA PCR - neg Urine strep _POS 12/21 Intubated, paralyzed, ARDS protocol, on 7cc/kg 12/22 Weaned to 6 cc/kg with dyssynchrony, back on 7 cc/kg, driving pressures good, weaning vent 12/23 Attempt to wean sedation again with dyssynchrony and desaturation.  Some concern for cuff leak, tube exchange.  Cuff did not appear to be blown.  Ongoing signs of cuff leak, query leaking around cough, possible large trachea, diuresing 12/25 tolerating PSV, myoclonus> ceribell, Neuro consult, changed to propofol, diuresed  12/26 Changed to DNR. On Propofol, versed.  Tolerating PSV.  MRI brain with hypoxic/anoxic injury Blood culture neg 12/27 GPD's on EEG, pending transfer to [facility] for cEEG NEURO CONSULT 12/28 moved to [facility] for LTM EEG 12/31 family updated by neuro: no chance for meaningful recovery, they should make plans for comfort measures 1/1 cultures - resp normal flora 1/2 eeg still with epileptogenicity. Palli fam mtg -- family does not want to hear from medical teams unless there is "good news"  1/3  cEEG with burst suppression w highly epileptiform bursts.  1/4 cEEG with burst suppression  + highly epileptiform bursts still.  WBC slightly up to 22.5. Temp high 99 low 100  c-EEG stopped duie to lack of benefit Pallative care consult - wife upset about early dc from ER on 12/18 Chaplain consult: Wife is sufering from anticipatory grief + accepting god's way + curious/anger about events leading upto current state EEG: generalized epileptogenicity with high potential for  seizures as well as profound diffuse encephalopathy. In the setting of cardiac arrest, this EEG pattern is suggestive of anoxic/hypoxic brain injury.  1/5 - Low grade fever +. [place] 22.5K.  on Zosyn. On vent fio2 30%. TF +. Per RN - diprivan stopped yesterday. No myoclonus today. Mild tachycardia + Rpt pall care on 10/21/22 per wife 1/6 - On vent FiO2 30%.  Tmax 99,22F ., WBC better. Still off diprivan -> No myoclonus. cXR visualized - devices in place and faiure clear Oral oxy and klonpin stopped 1/8 No significant changes in neuro status. Tachycardic to 130s, hypertensive to 140s. Coreg transitioned to metoprolol. 1/11 wife considering trach/peg, to discuss with surgery-discussed with Dr. Bobbye Morton 1/12 more tachycardic 1/16-spoke with wife at length at bedside, Dr. Hulen Skains also had a long conversation with spouse-desires to continue current aggressive care and desires PEG and trach placed 1/18 perc trach and peg 11/05/2022 transferred to floor due to need for ICU bed 11/08/2022 transition to [facility] with pulmonary following weekly for trach 2/4 recurrent fever, cultures collected. 2/5 changed to cuffless trach 2/18 antibiotics started for Pseudomonas on tracheal aspirate cultre 2/22 concern for myoclonic seizure, neuro reconsulted 2/26 transferred to ICU for secretions 3/1: Respiratory status has improved.  Okay for transfer back to progressive floor.    Subjective/Interval History: Patient remains unresponsive for the most part.    Assessment/Plan:  Goals of Care IPAL from 1/24 reviewed. He had been DNR, but transitioned back to full code. - Remains full code per extensive discussions with patient's spouse by different providers.    Anoxic brain injury Myoclonic Seizure activity  Neurology discussed  with family on 12/31. 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  Signed off 12/31. EEG 2/22  consistent with anoxic/hypoxic brain injury.  EEG changes c/w myoclonic seizure. Neurology to evaluate, appreciate assistance (2/24 last note).  - leg movements on 2/23 did not correlate with seizure activity - Continue keppra, depakene - added clonazepam BID (if causing sedation, can discontinue per neurology 2/24 note).  Dose of Klonopin was decreased on 2/27.  Continue current dose for now.   - LTM d/c'd by neurology on 2/24. Patient has had no neurological improvement in the last few weeks.   Acute hypoxic respiratory failure, ARDS due to influenza A (+12/18) and superimposed Pneumococcal pneumonia: - s/p tamiflu 12/21-12/25 and ceftriaxone 12/20 - 12/26. Then had zosyn x7 days starting 12/31 per report. Tolerated this well.  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 Pulmonology following on a weekly basis.  Tracheostomy tube was changed on 12/13/2022. He was experiencing significant secretions requiring frequent suctioning.  Patient was transferred back to the ICU due to significant secretions.  He underwent chest physiotherapy.  On 2/29 he was cleared by pulmonology for transfer out of the ICU. Will repeat chest x-ray today.  Follow-up on echocardiogram.   Pseudomonas pneumonia vs. tracheitis:  - culture 2/17 pseudomonas resistant to imipenem - repeat cultures pending 2/25 with pseudomonas, sensitivities reviewed.   - repeat cultures from 2/27 sensitivities reviewed. Patient completed 10-day course of Zosyn on 12/16/22.  He also received a few days of levofloxacin. Patient is on tobramycin nebulizer treatments.   PEA arrest:  Continue cardiac monitoring, electrolyte monitoring.    Recurrent Fevers  Leukocytosis Intermittent fevers throughout hospital stay.  Has received multiple antibiotic courses.   See discussions above. Stable currently. Increase in WBC noted yesterday.  Will recheck tomorrow.  Noted to be afebrile.   Paroxysmal sympathetic hyperactivity/sinus  tachycardia Causing sinus tachycardia, hypotension.  Has been on propranolol and Neurontin.  Dose of propranolol was increased to 60 mg every 8 hours.  Metoprolol as needed for tachycardia Heart rate has been slightly higher than usual over the last 48 hours.  Dose of propranolol was increased yesterday.   TSH noted to be normal.  Mild elevation in free T4 is of unclear clinical significance.  Does not reflect hyperthyroidism.   Echocardiogram is pending.  Constipation Laxatives on hold due to frequent BMs.   Elevated LFT's LFTs were noted to be normal as of 12/12/2022.   Hypotension Blood pressure stable for the most part.    Severe protein calorie malnutrition:  Continue tube feeds thru PEG placed 11/01/2022.   Prediabetes HbA1c 5.9% on 12/20.  Monitor CBGs.  Has not required sliding scale coverage in several days.   Hypernatremia Mild, resolved with increased free water.  Sodium level is now in the hyponatremia range.  Dose of free water was decreased.  Sodium level is stable.  Continue free water at current dose.  Recheck labs tomorrow.   Diarrhea Appears to be subsiding.  Has a rectal tube.   Pressure Ulcers Pressure Injury 10/12/22 Sacrum Mid;Upper Stage 2 -  Partial thickness loss of dermis presenting as a shallow open injury with a red, pink wound bed without slough. (Active)  10/12/22 0200  Location: Sacrum  Location Orientation: Mid;Upper  Staging: Stage 2 -  Partial thickness loss of dermis presenting as a shallow open injury with a red, pink wound bed without slough.  Wound Description (Comments):   Present on Admission: Yes  DVT Prophylaxis: Lovenox Code Status: Full code Family Communication: No family at bedside Disposition Plan: To be determined  Status is: Inpatient Remains inpatient appropriate because: Acute respiratory failure, anoxic brain injury, seizure activities      Medications: Scheduled:  Chlorhexidine Gluconate Cloth  6 each Topical  Daily   clonazepam  1 mg Oral BID   enoxaparin (LOVENOX) injection  40 mg Subcutaneous Q24H   famotidine  20 mg Per Tube QHS   feeding supplement (PROSource TF20)  60 mL Per Tube BID   fiber supplement (BANATROL TF)  60 mL Per Tube BID   fluconazole  100 mg Per Tube Daily   free water  200 mL Per Tube Q6H   gabapentin  100 mg Per Tube Q8H   guaiFENesin  400 mg Per Tube Q8H   leptospermum manuka honey  1 Application Topical QHS   levETIRAcetam  1,500 mg Per Tube BID   nutrition supplement (JUVEN)  1 packet Per Tube BID BM   mouth rinse  15 mL Mouth Rinse 4 times per day   propranolol  60 mg Per Tube Q8H   scopolamine  1 patch Transdermal Q72H   tobramycin (PF)  300 mg Nebulization BID   valproic acid  500 mg Per Tube Q8H   Continuous:  sodium chloride 250 mL (12/13/22 0041)   feeding supplement (OSMOLITE 1.5 CAL) 70 mL/hr at 12/17/22 0700   [id number] (TYLENOL) oral liquid 160 mg/5 mL **OR** acetaminophen, ipratropium-albuterol, lip balm, metoprolol tartrate, ondansetron **OR** ondansetron (ZOFRAN) IV, mouth rinse, oxyCODONE  Antibiotics: Anti-infectives (From admission, onward)    Start     Dose/Rate Route Frequency Ordered Stop   12/13/22 1200  piperacillin-tazobactam (ZOSYN) IVPB 3.375 g        3.375 g 12.5 mL/hr over 240 Minutes Intravenous Every 8 hours 12/13/22 1003 12/16/22 2330   12/13/22 1100  tobramycin (PF) (TOBI) nebulizer solution 300 mg        300 mg Nebulization 2 times daily 12/13/22 1010     12/12/22 2130  fluconazole (DIFLUCAN) 40 MG/ML suspension 100 mg  Status:  Discontinued        100 mg Oral Daily 12/12/22 2040 12/12/22 2043   12/12/22 2130  fluconazole (DIFLUCAN) tablet 100 mg        100 mg Per Tube Daily 12/12/22 2043 12/19/22 0959   12/11/22 0930  levofloxacin (LEVAQUIN) IVPB 750 mg        750 mg 100 mL/hr over 90 Minutes Intravenous Every 24 hours 12/11/22 0830 12/13/22 1246   12/03/22 1115  piperacillin-tazobactam (ZOSYN) IVPB 3.375 g         3.375 g 12.5 mL/hr over 240 Minutes Intravenous Every 8 hours 12/03/22 1018 12/13/22 0746   12/02/22 1245  ceFEPIme (MAXIPIME) 2 g in sodium chloride 0.9 % 100 mL IVPB  Status:  Discontinued        2 g 200 mL/hr over 30 Minutes Intravenous Every 8 hours 12/02/22 1145 12/03/22 0940   11/05/22 0900  ceFEPIme (MAXIPIME) 2 g in sodium chloride 0.9 % 100 mL IVPB        2 g 200 mL/hr over 30 Minutes Intravenous Every 8 hours 11/05/22 0804 11/12/22 0050   11/01/22 1215  ceFAZolin (ANCEF) IVPB 2g/100 mL premix        2 g 200 mL/hr over 30 Minutes Intravenous  Once 11/01/22 0851 11/01/22 1232   10/14/22 1000  piperacillin-tazobactam (ZOSYN) IVPB 3.375 g        3.375 g  12.5 mL/hr over 240 Minutes Intravenous Every 8 hours 10/14/22 0951 10/21/22 0556   10/04/22 2200  cefTRIAXone (ROCEPHIN) 2 g in sodium chloride 0.9 % 100 mL IVPB  Status:  Discontinued        2 g 200 mL/hr over 30 Minutes Intravenous Daily at bedtime 10/04/22 0914 10/10/22 0900   10/04/22 1045  oseltamivir (TAMIFLU) 6 MG/ML suspension 75 mg        75 mg Per Tube 2 times daily 10/04/22 0950 10/08/22 2229   10/04/22 0600  cefTRIAXone (ROCEPHIN) 2 g in sodium chloride 0.9 % 100 mL IVPB  Status:  Discontinued        2 g 200 mL/hr over 30 Minutes Intravenous Every 24 hours 10/03/22 1054 10/03/22 1250   10/04/22 0600  azithromycin (ZITHROMAX) 500 mg in sodium chloride 0.9 % 250 mL IVPB  Status:  Discontinued        500 mg 250 mL/hr over 60 Minutes Intravenous Every 24 hours 10/03/22 1054 10/03/22 1250   10/03/22 2200  cefTRIAXone (ROCEPHIN) 1 g in sodium chloride 0.9 % 100 mL IVPB  Status:  Discontinued        1 g 200 mL/hr over 30 Minutes Intravenous Daily at bedtime 10/03/22 1559 10/04/22 0914   10/03/22 2200  metroNIDAZOLE (FLAGYL) IVPB 500 mg  Status:  Discontinued        500 mg 100 mL/hr over 60 Minutes Intravenous Every 12 hours 10/03/22 1559 10/05/22 0812   10/03/22 1400  piperacillin-tazobactam (ZOSYN) IVPB 3.375 g  Status:   Discontinued        3.375 g 12.5 mL/hr over 240 Minutes Intravenous Every 8 hours 10/03/22 1258 10/03/22 1559   10/03/22 0515  cefTRIAXone (ROCEPHIN) 1 g in sodium chloride 0.9 % 100 mL IVPB        1 g 200 mL/hr over 30 Minutes Intravenous  Once 10/03/22 0503 10/03/22 0732   10/03/22 0515  azithromycin (ZITHROMAX) 500 mg in sodium chloride 0.9 % 250 mL IVPB        500 mg 250 mL/hr over 60 Minutes Intravenous  Once 10/03/22 0503 10/03/22 0732       Objective:  Vital Signs  Vitals:   12/17/22 0500 12/17/22 0600 12/17/22 0700 12/17/22 0800  BP: 108/78 (!) 129/100 (!) 117/94 107/85  Pulse: (!) 129 (!) 127 (!) 114 (!) 117  Resp: (!) 22 (!) 30 (!) 30 (!) 28  Temp:    99.1 F (37.3 C)  TempSrc:    Axillary  SpO2: 98% 98% 95% 96%  Weight: 59.9 kg     Height:        Intake/Output Summary (Last 24 hours) at 12/17/2022 0854 Last data filed at 12/17/2022 0700 Gross per 24 hour  Intake 2564.36 ml  Output 2019 ml  Net 545.36 ml    Filed Weights   12/15/22 0405 12/16/22 0500 12/17/22 0500  Weight: 59 kg 60.3 kg 59.9 kg    General appearance: Unresponsive Resp: Tachypneic.  Coarse breath sounds bilaterally.  Few crackles at the bases.  No wheezing or rhonchi. Cardio: S1-S2 is tachycardic regular.  No S3-S4.  No rubs murmurs or bruit GI: Abdomen is soft.  PEG tube is noted.  Abdomen appears to be nontender nondistended.  Lab Results:  Data Reviewed: I have personally reviewed following labs and reports of the imaging studies  CBC: Recent Labs  Lab 12/10/22 1430 12/11/22 0200 12/12/22 0448 12/14/22 0732 12/16/22 0221  WBC 10.8* 11.0* 12.8* 12.7* 14.7*  NEUTROABS 5.6  6.8 7.1 8.0*  --   HGB 11.5* 11.4* 11.4* 11.5* 11.6*  HCT 35.7* 36.3* 35.1* 37.0* 36.2*  MCV 89.3 91.0 87.8 90.5 88.3  PLT 284 278 304 279 309     Basic Metabolic Panel: Recent Labs  Lab 12/10/22 1430 12/11/22 0200 12/12/22 0448 12/14/22 0732 12/16/22 0221  NA 135 137 134* 134* 134*  K 4.3 4.0 4.4  4.5 4.5  CL 101 102 100 103 104  CO2 23 21* '23 24 22  '$ GLUCOSE 133* 140* 146* 114* 126*  BUN 37* 42* 35* 29* 38*  CREATININE 0.55* 0.61 0.61 0.68 0.58*  CALCIUM 9.4 9.1 9.1 9.2 8.8*  MG 2.1 2.2 2.2 2.1 2.2  PHOS 4.1 4.6 3.8 4.8*  --      GFR: Estimated Creatinine Clearance: 82.2 mL/min (A) (by C-G formula based on SCr of 0.58 mg/dL (L)).  Liver Function Tests: Recent Labs  Lab 12/10/22 1430 12/11/22 0200 12/12/22 0448 12/14/22 0732  AST '25 24 31 27  '$ ALT 32 33 32 36  ALKPHOS 74 75 81 65  BILITOT 0.3 0.4 0.5 0.4  PROT 6.9 6.9 7.0 7.0  ALBUMIN 2.3* 2.3* 2.2* 2.3*      Recent Labs  Lab 12/10/22 1430  AMMONIA 38*      CBG: Recent Labs  Lab 12/15/22 2309 12/16/22 0824 12/16/22 1558 12/17/22 0002 12/17/22 0344  GLUCAP 132* 142* 170* 117* 98      Recent Results (from the past 240 hour(s))  Culture, Respiratory w Gram Stain     Status: None   Collection Time: 12/09/22  4:45 PM   Specimen: Tracheal Aspirate; Respiratory  Result Value Ref Range Status   Specimen Description TRACHEAL ASPIRATE  Final   Special Requests NONE  Final   Gram Stain   Final    MODERATE WBC PRESENT,BOTH PMN AND MONONUCLEAR RARE GRAM NEGATIVE RODS Performed at [facility], [place] [address] [place], [place] [address]    Culture FEW PSEUDOMONAS AERUGINOSA  Final   Report Status 12/12/2022 FINAL  Final   Organism ID, Bacteria PSEUDOMONAS AERUGINOSA  Final      Susceptibility   Pseudomonas aeruginosa - MIC*    CEFTAZIDIME 4 SENSITIVE Sensitive     CIPROFLOXACIN <=0.25 SENSITIVE Sensitive     GENTAMICIN <=1 SENSITIVE Sensitive     IMIPENEM >=16 RESISTANT Resistant     PIP/TAZO 16 SENSITIVE Sensitive     CEFEPIME 4 SENSITIVE Sensitive     * FEW PSEUDOMONAS AERUGINOSA  MRSA Next Gen by PCR, Nasal     Status: None   Collection Time: 12/09/22  5:25 PM   Specimen: Nasal Mucosa; Nasal Swab  Result Value Ref Range Status   MRSA by PCR Next Gen NOT DETECTED NOT DETECTED Final     Comment: (NOTE) The GeneXpert MRSA Assay (FDA approved for NASAL specimens only), is one component of a comprehensive MRSA colonization surveillance program. It is not intended to diagnose MRSA infection nor to guide or monitor treatment for MRSA infections. Test performance is not FDA approved in patients less than 41 years old. Performed at [facility], [place] [address] [place], [place] [address]   Culture, Respiratory w Gram Stain     Status: None   Collection Time: 12/11/22  8:29 AM   Specimen: Tracheal Aspirate; Respiratory  Result Value Ref Range Status   Specimen Description TRACHEAL ASPIRATE  Final   Special Requests NONE  Final   Gram Stain   Final  ABUNDANT WBC PRESENT, PREDOMINANTLY PMN NO ORGANISMS SEEN Performed at [facility] [address], [place], [place] [address]    Culture FEW PSEUDOMONAS AERUGINOSA  Final   Report Status 12/13/2022 FINAL  Final   Organism ID, Bacteria PSEUDOMONAS AERUGINOSA  Final      Susceptibility   Pseudomonas aeruginosa - MIC*    CEFTAZIDIME 4 SENSITIVE Sensitive     CIPROFLOXACIN <=0.25 SENSITIVE Sensitive     GENTAMICIN <=1 SENSITIVE Sensitive     IMIPENEM >=16 RESISTANT Resistant     PIP/TAZO 16 SENSITIVE Sensitive     CEFEPIME 2 SENSITIVE Sensitive     * FEW PSEUDOMONAS AERUGINOSA      Radiology Studies: No results found.     LOS: 75 days   Kandis Henry [employer] on [email or web address]  12/17/2022, 8:54 AM

## 2022-12-17 NOTE — Progress Notes (Signed)
  Echocardiogram 2D Echocardiogram has been performed.  Damon [name] 12/17/2022, 3:16 PM

## 2022-12-18 DIAGNOSIS — J9601 Acute respiratory failure with hypoxia: Secondary | ICD-10-CM | POA: Diagnosis not present

## 2022-12-18 DIAGNOSIS — R000 Tachycardia, unspecified: Secondary | ICD-10-CM | POA: Diagnosis not present

## 2022-12-18 DIAGNOSIS — J151 Pneumonia due to Pseudomonas: Secondary | ICD-10-CM | POA: Diagnosis not present

## 2022-12-18 LAB — BASIC METABOLIC PANEL
Anion gap: 7 (ref 5–15)
BUN: 49 mg/dL — ABNORMAL HIGH (ref 6–20)
CO2: 25 mmol/L (ref 22–32)
Calcium: 9.2 mg/dL (ref 8.9–10.3)
Chloride: 106 mmol/L (ref 98–111)
Creatinine, Ser: 0.66 mg/dL (ref 0.61–1.24)
GFR, Estimated: >60 mL/min (ref >60)
Glucose, Bld: 140 mg/dL — ABNORMAL HIGH (ref 70–99)
Potassium: 4.3 mmol/L (ref 3.5–5.1)
Sodium: 138 mmol/L (ref 135–145)

## 2022-12-18 LAB — CBC
HCT: 35.2 % — ABNORMAL LOW (ref 39.0–52.0)
Hemoglobin: 11.3 g/dL — ABNORMAL LOW (ref 13.0–17.0)
MCH: 28.8 pg (ref 26.0–34.0)
MCHC: 32.1 g/dL (ref 30.0–36.0)
MCV: 89.6 fL (ref 80.0–100.0)
Platelets: 291 10*3/uL (ref 150–400)
RBC: 3.93 MIL/uL — ABNORMAL LOW (ref 4.22–5.81)
RDW: 15.8 % — ABNORMAL HIGH (ref 11.5–15.5)
WBC: 10 10*3/uL (ref 4.0–10.5)
nRBC: 0.2 % (ref 0.0–0.2)

## 2022-12-18 LAB — MAGNESIUM: Magnesium: 2.3 mg/dL (ref 1.7–2.4)

## 2022-12-18 LAB — PHOSPHORUS: Phosphorus: 4.5 mg/dL (ref 2.5–4.6)

## 2022-12-18 LAB — GLUCOSE, CAPILLARY: Glucose-Capillary: 121 mg/dL — ABNORMAL HIGH (ref 70–99)

## 2022-12-18 NOTE — Progress Notes (Addendum)
[facility] PROGRESS NOTE   Damon [name] [id number] DOB: October 07, 1961 DOA: 10/03/2022  PCP: [facility]  Brief History: Damon [name] is a 62 y.o. male with a history of HTN, HLD, tobacco use, chronic back pain who presented to the [facility] on 10/03/2022 with influenza A. He's had a protracted hospitalization summarized below.   Significant Events 12/18 Flu A positive 12/20 Admitted, bipap, 5 minute code MRSA PCR - neg Urine strep _POS 12/21 Intubated, paralyzed, ARDS protocol, on 7cc/kg 12/22 Weaned to 6 cc/kg with dyssynchrony, back on 7 cc/kg, driving pressures good, weaning vent 12/23 Attempt to wean sedation again with dyssynchrony and desaturation.  Some concern for cuff leak, tube exchange.  Cuff did not appear to be blown.  Ongoing signs of cuff leak, query leaking around cough, possible large trachea, diuresing 12/25 tolerating PSV, myoclonus> ceribell, Neuro consult, changed to propofol, diuresed  12/26 Changed to DNR. On Propofol, versed.  Tolerating PSV.  MRI brain with hypoxic/anoxic injury Blood culture neg 12/27 GPD's on EEG, pending transfer to [facility] for cEEG NEURO CONSULT 12/28 moved to [facility] for LTM EEG 12/31 family updated by neuro: no chance for meaningful recovery, they should make plans for comfort measures 1/1 cultures - resp normal flora 1/2 eeg still with epileptogenicity. Palli fam mtg -- family does not want to hear from medical teams unless there is "good news"  1/3  cEEG with burst suppression w highly epileptiform bursts.  1/4 cEEG with burst suppression  + highly epileptiform bursts still.  WBC slightly up to 22.5. Temp high 99 low 100  c-EEG stopped duie to lack of benefit Pallative care consult - wife upset about early dc from ER on 12/18 Chaplain consult: Wife is sufering from anticipatory grief + accepting god's way + curious/anger about events leading upto current state EEG: generalized epileptogenicity with high potential for  seizures as well as profound diffuse encephalopathy. In the setting of cardiac arrest, this EEG pattern is suggestive of anoxic/hypoxic brain injury.  1/5 - Low grade fever +. [place] 22.5K.  on Zosyn. On vent fio2 30%. TF +. Per RN - diprivan stopped yesterday. No myoclonus today. Mild tachycardia + Rpt pall care on 10/21/22 per wife 1/6 - On vent FiO2 30%.  Tmax 99,59F ., WBC better. Still off diprivan -> No myoclonus. cXR visualized - devices in place and faiure clear Oral oxy and klonpin stopped 1/8 No significant changes in neuro status. Tachycardic to 130s, hypertensive to 140s. Coreg transitioned to metoprolol. 1/11 wife considering trach/peg, to discuss with surgery-discussed with Dr. Bobbye Morton 1/12 more tachycardic 1/16-spoke with wife at length at bedside, Dr. Hulen Skains also had a long conversation with spouse-desires to continue current aggressive care and desires PEG and trach placed 1/18 perc trach and peg 11/05/2022 transferred to floor due to need for ICU bed 11/08/2022 transition to [facility] with pulmonary following weekly for trach 2/4 recurrent fever, cultures collected. 2/5 changed to cuffless trach 2/18 antibiotics started for Pseudomonas on tracheal aspirate cultre 2/22 concern for myoclonic seizure, neuro reconsulted 2/26 transferred to ICU for secretions.  3/1-3/3: Respiratory status has improved.  Completed 10 days of Zosyn. Okay for transfer back to progressive floor.    Subjective/Interval History: Patient with eyes open but does not follow commands.  Assessment/Plan:  Goals of Care IPAL from 1/24 reviewed. He had been DNR, but transitioned back to full code. - Remains full code per extensive discussions with patient's spouse by different providers.    Anoxic brain injury  Myoclonic Seizure activity  Neurology discussed with family on 12/31. "Has sustained irreversible anoxic brain injury that is catastrophic." Family was encouraged to consider comfort  measures.   EEG 2/22 consistent with anoxic/hypoxic brain injury.  EEG changes c/w myoclonic seizure.  Neurology was reconsulted due to right lower extremity movements.  Clonazepam was added to his regimen.  Leg movements on 2/23 did not correlate with seizure activity.  Last seen by neurology on 2/23. LTM d/c'd by neurology. Patient currently on Keppra, Depakote and clonazepam.  Wife is concerned about excessive sedation.  Dose of clonazepam was decreased yesterday.  Patient has not had any neurological improvement in the last few weeks.   Acute hypoxic respiratory failure, ARDS due to influenza A (+12/18) and superimposed Pneumococcal pneumonia: S/p tamiflu 12/21-12/25 and ceftriaxone 12/20 - 12/26. Then had zosyn x7 days starting 12/31 per report. Tolerated this well.  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 Pulmonology following on a weekly basis.  Tracheostomy tube was changed on 12/13/2022. He was experiencing significant secretions requiring frequent suctioning.  Patient was transferred back to the ICU due to significant secretions.  He underwent chest physiotherapy.  On 2/29 he was cleared by pulmonology for transfer out of the ICU. Chest x-ray was repeated on 3/4 and does not show any acute findings.     Pseudomonas pneumonia vs. tracheitis:  - culture 2/17 pseudomonas resistant to imipenem - repeat cultures pending 2/25 with pseudomonas, sensitivities reviewed.   - repeat cultures from 2/27 sensitivities reviewed. Patient completed 10-day course of Zosyn on 12/16/22.  He also received a few days of levofloxacin. Patient is on tobramycin nebulizer treatments.  PEA arrest:  Continue cardiac monitoring, electrolyte monitoring.    Recurrent Fevers  Leukocytosis Intermittent fevers throughout hospital stay.  Has received multiple antibiotic courses.   See discussions above. Stable currently. Increase in WBC noted on 3/3.  WBC noted to be normal today.  Paroxysmal  sympathetic hyperactivity/sinus tachycardia Causing sinus tachycardia, hypotension.  Has been on propranolol and Neurontin.  Dose of propranolol was increased to 60 mg every 8 hours.  Metoprolol as needed for tachycardia Heart rate has been slightly higher than usual over the last 48 hours.  Dose of propranolol was increased yesterday.   TSH noted to be normal.  Mild elevation in free T4 is of unclear clinical significance.  Does not reflect hyperthyroidism.   Echocardiogram was done on 3/4 and shows borderline low normal EF without any significant valvular abnormalities.    Constipation Laxatives on hold due to frequent BMs.   Elevated LFT's LFTs were noted to be normal as of 12/12/2022.   Hypotension Started on midodrine.  Monitor blood pressures closely.    Severe protein calorie malnutrition:  Continue tube feeds thru PEG placed 11/01/2022.   Prediabetes HbA1c 5.9% on 12/20.  Monitor CBGs.  Has not required sliding scale coverage in several days.   Hypernatremia Resolved with increased free water.  Subsequently developed hyponatremia.  Dose of free water was decreased.  Sodium level is stable.     Diarrhea Appears to be subsiding.  Has a rectal tube.   Pressure Ulcers Pressure Injury 10/12/22 Sacrum Mid;Upper Stage 2 -  Partial thickness loss of dermis presenting as a shallow open injury with a red, pink wound bed without slough. (Active)  10/12/22 0200  Location: Sacrum  Location Orientation: Mid;Upper  Staging: Stage 2 -  Partial thickness loss of dermis presenting as a shallow open injury with a red, pink wound bed without slough.  Wound Description (  Comments):   Present on Admission: Yes    DVT Prophylaxis: Lovenox Code Status: Full code Family Communication: No family at bedside.  Met with wife today at 63 AM. Gave her results of the ECHO. She mentioned that she thinks patient is improving because he now has his eyes open. She mentions that all providers have been lying  to her about his chances of recovery. She wanted to know the names of all of his medications which were provided to her. She asked about PT for the patient. She wants PT to work him. She continues to believe that patient will recover. She mentioned that she likes to meet with providers in person so that she can "see when they are lying". She didn't have any further questions. Disposition Plan: To be determined  Status is: Inpatient Remains inpatient appropriate because: Acute respiratory failure, anoxic brain injury, seizure activities      Medications: Scheduled:  Chlorhexidine Gluconate Cloth  6 each Topical Daily   clonazepam  0.5 mg Oral BID   enoxaparin (LOVENOX) injection  40 mg Subcutaneous Q24H   famotidine  20 mg Per Tube QHS   feeding supplement (PROSource TF20)  60 mL Per Tube BID   fiber supplement (BANATROL TF)  60 mL Per Tube BID   fluconazole  100 mg Per Tube Daily   free water  200 mL Per Tube Q6H   gabapentin  100 mg Per Tube Q8H   guaiFENesin  400 mg Per Tube Q8H   leptospermum manuka honey  1 Application Topical QHS   levETIRAcetam  1,500 mg Per Tube BID   midodrine  5 mg Per Tube Q8H   nutrition supplement (JUVEN)  1 packet Per Tube BID BM   mouth rinse  15 mL Mouth Rinse 4 times per day   propranolol  60 mg Per Tube Q8H   scopolamine  1 patch Transdermal Q72H   tobramycin (PF)  300 mg Nebulization BID   valproic acid  500 mg Per Tube Q8H   Continuous:  sodium chloride 250 mL (12/13/22 0041)   feeding supplement (OSMOLITE 1.5 CAL) 1,000 mL (12/18/22 0312)   [id number] (TYLENOL) oral liquid 160 mg/5 mL **OR** acetaminophen, ipratropium-albuterol, lip balm, metoprolol tartrate, ondansetron **OR** ondansetron (ZOFRAN) IV, mouth rinse, oxyCODONE  Antibiotics: Anti-infectives (From admission, onward)    Start     Dose/Rate Route Frequency Ordered Stop   12/13/22 1200  piperacillin-tazobactam (ZOSYN) IVPB 3.375 g        3.375 g 12.5 mL/hr over 240  Minutes Intravenous Every 8 hours 12/13/22 1003 12/16/22 2330   12/13/22 1100  tobramycin (PF) (TOBI) nebulizer solution 300 mg        300 mg Nebulization 2 times daily 12/13/22 1010     12/12/22 2130  fluconazole (DIFLUCAN) 40 MG/ML suspension 100 mg  Status:  Discontinued        100 mg Oral Daily 12/12/22 2040 12/12/22 2043   12/12/22 2130  fluconazole (DIFLUCAN) tablet 100 mg        100 mg Per Tube Daily 12/12/22 2043 12/19/22 0959   12/11/22 0930  levofloxacin (LEVAQUIN) IVPB 750 mg        750 mg 100 mL/hr over 90 Minutes Intravenous Every 24 hours 12/11/22 0830 12/13/22 1246   12/03/22 1115  piperacillin-tazobactam (ZOSYN) IVPB 3.375 g        3.375 g 12.5 mL/hr over 240 Minutes Intravenous Every 8 hours 12/03/22 1018 12/13/22 0746   12/02/22 1245  ceFEPIme (MAXIPIME) 2  g in sodium chloride 0.9 % 100 mL IVPB  Status:  Discontinued        2 g 200 mL/hr over 30 Minutes Intravenous Every 8 hours 12/02/22 1145 12/03/22 0940   11/05/22 0900  ceFEPIme (MAXIPIME) 2 g in sodium chloride 0.9 % 100 mL IVPB        2 g 200 mL/hr over 30 Minutes Intravenous Every 8 hours 11/05/22 0804 11/12/22 0050   11/01/22 1215  ceFAZolin (ANCEF) IVPB 2g/100 mL premix        2 g 200 mL/hr over 30 Minutes Intravenous  Once 11/01/22 0851 11/01/22 1232   10/14/22 1000  piperacillin-tazobactam (ZOSYN) IVPB 3.375 g        3.375 g 12.5 mL/hr over 240 Minutes Intravenous Every 8 hours 10/14/22 0951 10/21/22 0556   10/04/22 2200  cefTRIAXone (ROCEPHIN) 2 g in sodium chloride 0.9 % 100 mL IVPB  Status:  Discontinued        2 g 200 mL/hr over 30 Minutes Intravenous Daily at bedtime 10/04/22 0914 10/10/22 0900   10/04/22 1045  oseltamivir (TAMIFLU) 6 MG/ML suspension 75 mg        75 mg Per Tube 2 times daily 10/04/22 0950 10/08/22 2229   10/04/22 0600  cefTRIAXone (ROCEPHIN) 2 g in sodium chloride 0.9 % 100 mL IVPB  Status:  Discontinued        2 g 200 mL/hr over 30 Minutes Intravenous Every 24 hours 10/03/22 1054  10/03/22 1250   10/04/22 0600  azithromycin (ZITHROMAX) 500 mg in sodium chloride 0.9 % 250 mL IVPB  Status:  Discontinued        500 mg 250 mL/hr over 60 Minutes Intravenous Every 24 hours 10/03/22 1054 10/03/22 1250   10/03/22 2200  cefTRIAXone (ROCEPHIN) 1 g in sodium chloride 0.9 % 100 mL IVPB  Status:  Discontinued        1 g 200 mL/hr over 30 Minutes Intravenous Daily at bedtime 10/03/22 1559 10/04/22 0914   10/03/22 2200  metroNIDAZOLE (FLAGYL) IVPB 500 mg  Status:  Discontinued        500 mg 100 mL/hr over 60 Minutes Intravenous Every 12 hours 10/03/22 1559 10/05/22 0812   10/03/22 1400  piperacillin-tazobactam (ZOSYN) IVPB 3.375 g  Status:  Discontinued        3.375 g 12.5 mL/hr over 240 Minutes Intravenous Every 8 hours 10/03/22 1258 10/03/22 1559   10/03/22 0515  cefTRIAXone (ROCEPHIN) 1 g in sodium chloride 0.9 % 100 mL IVPB        1 g 200 mL/hr over 30 Minutes Intravenous  Once 10/03/22 0503 10/03/22 0732   10/03/22 0515  azithromycin (ZITHROMAX) 500 mg in sodium chloride 0.9 % 250 mL IVPB        500 mg 250 mL/hr over 60 Minutes Intravenous  Once 10/03/22 0503 10/03/22 0732       Objective:  Vital Signs  Vitals:   12/18/22 0302 12/18/22 0330 12/18/22 0500 12/18/22 0735  BP: 92/78   105/80  Pulse: (!) 108 (!) 103  (!) 108  Resp: (!) 22 (!) 26  (!) 26  Temp: 99.3 F (37.4 C)   98.7 F (37.1 C)  TempSrc: Axillary   Axillary  SpO2: 99% 100%  96%  Weight:   61 kg   Height:        Intake/Output Summary (Last 24 hours) at 12/18/2022 0846 Last data filed at 12/18/2022 0705 Gross per 24 hour  Intake 1300 ml  Output 1150  ml  Net 150 ml    Filed Weights   12/16/22 0500 12/17/22 0500 12/18/22 0500  Weight: 60.3 kg 59.9 kg 61 kg    General appearance: Eyes are open 1.  Unresponsive for the most part. Resp: Tracheostomy noted.  Mildly tachypneic.  Coarse breath sounds bilaterally. Cardio: S1-S2 is tachycardic regular.  No S3-S4.  No rubs murmurs or bruit GI:  Abdomen is soft.  Nontender nondistended.  PEG tube is noted. Extremities: No edema.  Occasional involuntary movement of the right lower extremity is noted.   Lab Results:  Data Reviewed: I have personally reviewed following labs and reports of the imaging studies  CBC: Recent Labs  Lab 12/12/22 0448 12/14/22 0732 12/16/22 0221 12/18/22 0436  WBC 12.8* 12.7* 14.7* 10.0  NEUTROABS 7.1 8.0*  --   --   HGB 11.4* 11.5* 11.6* 11.3*  HCT 35.1* 37.0* 36.2* 35.2*  MCV 87.8 90.5 88.3 89.6  PLT 304 279 309 291     Basic Metabolic Panel: Recent Labs  Lab 12/12/22 0448 12/14/22 0732 12/16/22 0221 12/18/22 0436  NA 134* 134* 134* 138  K 4.4 4.5 4.5 4.3  CL 100 103 104 106  CO2 '23 24 22 25  '$ GLUCOSE 146* 114* 126* 140*  BUN 35* 29* 38* 49*  CREATININE 0.61 0.68 0.58* 0.66  CALCIUM 9.1 9.2 8.8* 9.2  MG 2.2 2.1 2.2 2.3  PHOS 3.8 4.8*  --  4.5     GFR: Estimated Creatinine Clearance: 82.2 mL/min (by C-G formula based on SCr of 0.66 mg/dL).  Liver Function Tests: Recent Labs  Lab 12/12/22 0448 12/14/22 0732  AST 31 27  ALT 32 36  ALKPHOS 81 65  BILITOT 0.5 0.4  PROT 7.0 7.0  ALBUMIN 2.2* 2.3*     CBG: Recent Labs  Lab 12/16/22 1558 12/17/22 0002 12/17/22 0344 12/17/22 0829 12/18/22 0733  GLUCAP 170* 117* 98 152* 121*      Recent Results (from the past 240 hour(s))  Culture, Respiratory w Gram Stain     Status: None   Collection Time: 12/09/22  4:45 PM   Specimen: Tracheal Aspirate; Respiratory  Result Value Ref Range Status   Specimen Description TRACHEAL ASPIRATE  Final   Special Requests NONE  Final   Gram Stain   Final    MODERATE WBC PRESENT,BOTH PMN AND MONONUCLEAR RARE GRAM NEGATIVE RODS Performed at [facility], [place] [address], [place], [place] [address]    Culture FEW PSEUDOMONAS AERUGINOSA  Final   Report Status 12/12/2022 FINAL  Final   Organism ID, Bacteria PSEUDOMONAS AERUGINOSA  Final      Susceptibility   Pseudomonas  aeruginosa - MIC*    CEFTAZIDIME 4 SENSITIVE Sensitive     CIPROFLOXACIN <=0.25 SENSITIVE Sensitive     GENTAMICIN <=1 SENSITIVE Sensitive     IMIPENEM >=16 RESISTANT Resistant     PIP/TAZO 16 SENSITIVE Sensitive     CEFEPIME 4 SENSITIVE Sensitive     * FEW PSEUDOMONAS AERUGINOSA  MRSA Next Gen by PCR, Nasal     Status: None   Collection Time: 12/09/22  5:25 PM   Specimen: Nasal Mucosa; Nasal Swab  Result Value Ref Range Status   MRSA by PCR Next Gen NOT DETECTED NOT DETECTED Final    Comment: (NOTE) The GeneXpert MRSA Assay (FDA approved for NASAL specimens only), is one component of a comprehensive MRSA colonization surveillance program. It is not intended to diagnose MRSA infection nor to guide or monitor treatment for  MRSA infections. Test performance is not FDA approved in patients less than 76 years old. Performed at [facility], [place] [address], [place], [place] [address]   Culture, Respiratory w Gram Stain     Status: None   Collection Time: 12/11/22  8:29 AM   Specimen: Tracheal Aspirate; Respiratory  Result Value Ref Range Status   Specimen Description TRACHEAL ASPIRATE  Final   Special Requests NONE  Final   Gram Stain   Final    ABUNDANT WBC PRESENT, PREDOMINANTLY PMN NO ORGANISMS SEEN Performed at [facility], [address] [place], [place] [address]    Culture FEW PSEUDOMONAS AERUGINOSA  Final   Report Status 12/13/2022 FINAL  Final   Organism ID, Bacteria PSEUDOMONAS AERUGINOSA  Final      Susceptibility   Pseudomonas aeruginosa - MIC*    CEFTAZIDIME 4 SENSITIVE Sensitive     CIPROFLOXACIN <=0.25 SENSITIVE Sensitive     GENTAMICIN <=1 SENSITIVE Sensitive     IMIPENEM >=16 RESISTANT Resistant     PIP/TAZO 16 SENSITIVE Sensitive     CEFEPIME 2 SENSITIVE Sensitive     * FEW PSEUDOMONAS AERUGINOSA      Radiology Studies: ECHOCARDIOGRAM COMPLETE  Result Date: 12/17/2022    ECHOCARDIOGRAM REPORT   Patient Name:   SADIKI LOLLIE Date of Exam:  12/17/2022 Medical Rec #:  [id number]     Height:       64.0 in Accession #:    [id number]    Weight:       132.0 lb Date of Birth:  11/26/1960     BSA:          1.640 m Patient Age:    61 years      BP:           112/71 mmHg Patient Gender: M             HR:           120 bpm. Exam Location:  Inpatient Procedure: 2D Echo Indications:    abnormal ecg  History:        Patient has no prior history of Echocardiogram examinations.                 Risk Factors:Former Smoker, Hypertension and Dyslipidemia.  Sonographer:    Johny Chess RDCS Referring Phys: [id number] [place]  1. Basal inferior/inferolateral and septal hypokinesis. . Left ventricular ejection fraction, by estimation, is 50 to 55%. The left ventricle has low normal function. There is mild left ventricular hypertrophy.  2. Right ventricular systolic function is normal. The right ventricular size is normal.  3. The mitral valve is normal in structure. Trivial mitral valve regurgitation.  4. The aortic valve is tricuspid. Aortic valve regurgitation is not visualized.  5. The inferior vena cava is normal in size with greater than 50% respiratory variability, suggesting right atrial pressure of 3 mmHg. FINDINGS  Left Ventricle: Basal inferior/inferolateral and septal hypokinesis. Left ventricular ejection fraction, by estimation, is 50 to 55%. The left ventricle has low normal function. The left ventricular internal cavity size was normal in size. There is mild  left ventricular hypertrophy. Right Ventricle: The right ventricular size is normal. Right vetricular wall thickness was not assessed. Right ventricular systolic function is normal. Left Atrium: Left atrial size was normal in size. Right Atrium: Right atrial size was normal in size. Pericardium: There is no evidence of pericardial effusion. Mitral Valve: The mitral valve is normal in structure.  Trivial mitral valve regurgitation. Tricuspid Valve: The tricuspid valve is normal in structure.  Tricuspid valve regurgitation is trivial. Aortic Valve: The aortic valve is tricuspid. Aortic valve regurgitation is not visualized. Pulmonic Valve: The pulmonic valve was not well visualized. Pulmonic valve regurgitation is not visualized. No evidence of pulmonic stenosis. Aorta: The aortic root is normal in size and structure. Venous: The inferior vena cava is normal in size with greater than 50% respiratory variability, suggesting right atrial pressure of 3 mmHg. IAS/Shunts: No atrial level shunt detected by color flow Doppler.  LEFT VENTRICLE PLAX 2D LVIDd:         3.60 cm Diastology LVIDs:         2.70 cm LV e' medial:    6.74 cm/s LV PW:         1.20 cm LV E/e' medial:  5.4 LV IVS:        1.10 cm LV e' lateral:   9.90 cm/s                        LV E/e' lateral: 3.6  RIGHT VENTRICLE             IVC RV Basal diam:  2.10 cm     IVC diam: 0.90 cm RV S prime:     12.50 cm/s TAPSE (M-mode): 1.7 cm LEFT ATRIUM             Index        RIGHT ATRIUM          Index LA diam:        2.20 cm 1.34 cm/m   RA Area:     8.61 cm LA Vol (A2C):   15.1 ml 9.21 ml/m   RA Volume:   17.80 ml 10.86 ml/m LA Vol (A4C):   23.0 ml 14.03 ml/m LA Biplane Vol: 19.8 ml 12.08 ml/m  AORTIC VALVE LVOT Vmax:   82.30 cm/s LVOT Vmean:  54.200 cm/s LVOT VTI:    0.108 m MV E velocity: 36.10 cm/s MV A velocity: 87.00 cm/s  SHUNTS MV E/A ratio:  0.41        Systemic VTI: 0.11 m Dorris Carnes MD Electronically signed by Dorris Carnes MD Signature Date/Time: 12/17/2022/4:24:03 PM    Final    DG CHEST PORT 1 VIEW  Result Date: 12/17/2022 CLINICAL DATA:  Tachypnea EXAM: PORTABLE CHEST 1 VIEW COMPARISON:  12/09/2022 FINDINGS: Tracheostomy tube remains in place. Heart size is normal. Minimal atelectatic change in the medial right lung base. Lungs are otherwise clear. No pleural effusion or pneumothorax. IMPRESSION: Minimal atelectatic change in the medial right lung base. Lungs are otherwise clear. Electronically Signed   By: Davina Poke D.O.   On:  12/17/2022 10:08       LOS: 27 days   Rickey Sadowski [employer] on [email or web address]  12/18/2022, 8:46 AM

## 2022-12-19 DIAGNOSIS — J101 Influenza due to other identified influenza virus with other respiratory manifestations: Secondary | ICD-10-CM | POA: Diagnosis not present

## 2022-12-19 DIAGNOSIS — G931 Anoxic brain damage, not elsewhere classified: Secondary | ICD-10-CM | POA: Diagnosis not present

## 2022-12-19 DIAGNOSIS — J9601 Acute respiratory failure with hypoxia: Secondary | ICD-10-CM | POA: Diagnosis not present

## 2022-12-19 MED ORDER — BANATROL TF EN LIQD
60.0000 mL | Freq: Three times a day (TID) | ENTERAL | Status: DC
  Administered 2022-12-19 – 2022-12-25 (×19): 60 mL
  Filled 2022-12-19 (×19): qty 60

## 2022-12-19 NOTE — Progress Notes (Signed)
[facility] PROGRESS NOTE    Progress Note  Damon [name]  [id number] DOB: [birth year] DOA: 10/03/2022 PCP: [facility]     Brief Narrative:   Damon [name] is an 62 y.o. male past medical history of hypertension, hyperlipidemia tobacco abuse, chronic back pain comes into the [facility] long ED on 10/03/2022 for influenza A protracted hospitalization    Significant Events: 12/18 Flu A positive 12/20 Admitted, bipap, 5 minute code MRSA PCR - neg Urine strep _POS 12/21 Intubated, paralyzed, ARDS protocol, on 7cc/kg 12/22 Weaned to 6 cc/kg with dyssynchrony, back on 7 cc/kg, driving pressures good, weaning vent 12/23 Attempt to wean sedation again with dyssynchrony and desaturation.  Some concern for cuff leak, tube exchange.  Cuff did not appear to be blown.  Ongoing signs of cuff leak, query leaking around cough, possible large trachea, diuresing 12/25 tolerating PSV, myoclonus> ceribell, Neuro consult, changed to propofol, diuresed  12/26 Changed to DNR. On Propofol, versed.  Tolerating PSV.  MRI brain with hypoxic/anoxic injury Blood culture neg 12/27 GPD's on EEG, pending transfer to [facility] for cEEG NEURO CONSULT 12/28 moved to [facility] for LTM EEG 12/31 family updated by neuro: no chance for meaningful recovery, they should make plans for comfort measures 1/1 cultures - resp normal flora 1/2 eeg still with epileptogenicity. Palli fam mtg -- family does not want to hear from medical teams unless there is "good news"  1/3  cEEG with burst suppression w highly epileptiform bursts.  1/4 cEEG with burst suppression  + highly epileptiform bursts still.  WBC slightly up to 22.5. Temp high 99 low 100  c-EEG stopped duie to lack of benefit Pallative care consult - wife upset about early dc from ER on 12/18 Chaplain consult: Wife is sufering from anticipatory grief + accepting god's way + curious/anger about events leading upto current state EEG: generalized epileptogenicity  with high potential for seizures as well as profound diffuse encephalopathy. In the setting of cardiac arrest, this EEG pattern is suggestive of anoxic/hypoxic brain injury.  1/5 - Low grade fever +. [place]ansom 22.5K.  on Zosyn. On vent fio2 30%. TF +. Per RN - diprivan stopped yesterday. No myoclonus today. Mild tachycardia + Rpt pall care on 10/21/22 per wife 1/6 - On vent FiO2 30%.  Tmax 99,35F ., WBC better. Still off diprivan -> No myoclonus. cXR visualized - devices in place and faiure clear Oral oxy and klonpin stopped 1/8 No significant changes in neuro status. Tachycardic to 130s, hypertensive to 140s. Coreg transitioned to metoprolol. 1/11 wife considering trach/peg, to discuss with surgery-discussed with Dr. Bobbye Morton 1/12 more tachycardic 1/16-spoke with wife at length at bedside, Dr. Hulen Skains also had a long conversation with spouse-desires to continue current aggressive care and desires PEG and trach placed 1/18 perc trach and peg 11/05/2022 transferred to floor due to need for ICU bed 11/08/2022 transition to [facility] with pulmonary following weekly for trach 2/4 recurrent fever, cultures collected. 2/5 changed to cuffless trach 2/18 antibiotics started for Pseudomonas on tracheal aspirate cultre 2/22 concern for myoclonic seizure, neuro reconsulted 2/26 transferred to ICU for secretions.  3/1-3/3: Respiratory status has improved.  Completed 10 days of Zosyn. Okay for transfer back to progressive floor.  Assessment/Plan:   Goals of care: IPAL from 11/07/2022, she has been DNR but transition back to full code. Remains full code persistent discussion with spouse by different providers.  Anoxic brain injury/myoclonic seizure activity: Neurology had a long discussion with the family on 10/14/2022 that she  has sustained irreversible anoxic brain injury that is catastrophic family was encouraged to move towards comfort measures. EEG on 11/16/2022 show myoclonic seizures. Neurology  reconsulted due to right lower extremity movement: Ativan was added to her regimen. Last seen by neurology on 12/07/2022 LTM discontinued by neurology. Patient is currently on Keppra, Depakote and clonazepam. Wife is concerned about excessive sedation, and her dose of clonazepam was decreased on 12/18/2022. Patient has had no neurological improvement in the last weeks.  Acute hypoxic respiratory failure/ARDS due to influenza A and superimposed pneumococcal pneumonia: She completed course of Tamiflu, Rocephin then 7 days of Zosyn. 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  Pulmonary following on a weekly basis tracheostomy was exchanged on 12/13/2022. Has been experiencing significant secretions requiring suctioning. Patient was transferred back to the ICU due to to significant secretions, underwent chest physiotherapy. On 12/13/2022 was cleared by pulmonary to transfer out of the ICU. Repeated chest x-ray on 12/17/2022 showed no acute findings  Pseudomonas pneumonia versus tracheitis: Culture on 12/01/2022 Pseudomonas resistant to imipenem. She completed 10-day course of Zosyn on 12/16/2022.  She also received a few days of levofloxacin. Patient is an tobramycin nebulizer treatment.  PEA arrest: Continue cardiac monitoring.  Recurrent fever/leukocytosis: Has had intermittent fevers throughout her hospital stay she has received multiple courses of antibiotics. She has remained afebrile, and her leukocytosis has resolved. She was also treated with antifungal her last dose of insulin 12/18/2022.  Paroxysmal sympathetic hyperactivity/sinus tachycardia: Leading to sinus tachycardia and hypotension. Has been on propranolol and Neurontin.  Dose of propranolol was increased, can continue metoprolol IV as needed. Heart rate has been slightly higher than usual her dose of propranolol was increased on 12/18/2022 this morning is slightly improved. TSH unremarkable. Echocardiogram shows a low EF without  significant valvular abnormalities.  Constipation: Laxatives were held due to frequent bowel movements.  Hypotension: Continue midodrine blood pressure stable.  Severe protein caloric malnutrition: Continue tube feedings, PEG tube placed on 11/01/2022.  Prediabetes mellitus: A1c of 5.9 not requiring sliding scale for several days continue CBGs check.  Hypovolemic hyponatremia: Resolved with increased free water.  Diarrhea: Has resolved rectal tube in place will discontinue.  Sacral decubitus ulcer stage II RN Pressure Injury Documentation: Pressure Injury 10/12/22 Sacrum Mid;Upper Stage 2 -  Partial thickness loss of dermis presenting as a shallow open injury with a red, pink wound bed without slough. (Active)  10/12/22 0200  Location: Sacrum  Location Orientation: Mid;Upper  Staging: Stage 2 -  Partial thickness loss of dermis presenting as a shallow open injury with a red, pink wound bed without slough.  Wound Description (Comments):   Present on Admission: Yes  Dressing Type Honey;Foam - Lift dressing to assess site every shift 12/17/22 1400    DVT prophylaxis: lovenox Family Communication: not at Wife, previous physician Met with wife today at 49 AM. Gave her results of the ECHO. She mentioned that she thinks patient is improving because he now has his eyes open. She mentions that all providers have been lying to her about his chances of recovery. She wanted to know the names of all of his medications which were provided to her. She asked about PT for the patient. She wants PT to work him. She continues to believe that patient will recover. She mentioned that she likes to meet with providers in person so that she can "see when they are lying". She didn't have any further questions.  Status is: Inpatient Remains inpatient appropriate because: Devastating anoxic brain  injury.    Code Status:     Code Status Orders  (From admission, onward)           Start     Ordered    11/07/22 1116  Full code  Continuous       Question:  By:  Answer:  Consent: discussion documented in EHR   11/07/22 1115           Code Status History     Date Active Date Inactive Code Status Order ID Comments User Context   10/09/2022 1851 11/07/2022 1115 DNR [id number]  Garner Nash, DO Inpatient   10/03/2022 0808 10/09/2022 1850 Full Code [id number]  Jonnie Finner, DO ED         IV Access:   Peripheral IV   Procedures and diagnostic studies:   ECHOCARDIOGRAM COMPLETE  Result Date: 12/17/2022    ECHOCARDIOGRAM REPORT   Patient Name:   Damon [name] Date of Exam: 12/17/2022 Medical Rec #:  [id number]     Height:       64.0 in Accession #:    [id number]    Weight:       132.0 lb Date of Birth:  1960-12-04     BSA:          1.640 m Patient Age:    61 years      BP:           112/71 mmHg Patient Gender: M             HR:           120 bpm. Exam Location:  Inpatient Procedure: 2D Echo Indications:    abnormal ecg  History:        Patient has no prior history of Echocardiogram examinations.                 Risk Factors:Former Smoker, Hypertension and Dyslipidemia.  Sonographer:    Johny Chess RDCS Referring Phys: [id number] [place]  1. Basal inferior/inferolateral and septal hypokinesis. . Left ventricular ejection fraction, by estimation, is 50 to 55%. The left ventricle has low normal function. There is mild left ventricular hypertrophy.  2. Right ventricular systolic function is normal. The right ventricular size is normal.  3. The mitral valve is normal in structure. Trivial mitral valve regurgitation.  4. The aortic valve is tricuspid. Aortic valve regurgitation is not visualized.  5. The inferior vena cava is normal in size with greater than 50% respiratory variability, suggesting right atrial pressure of 3 mmHg. FINDINGS  Left Ventricle: Basal inferior/inferolateral and septal hypokinesis. Left ventricular ejection fraction, by estimation, is 50 to 55%. The left  ventricle has low normal function. The left ventricular internal cavity size was normal in size. There is mild  left ventricular hypertrophy. Right Ventricle: The right ventricular size is normal. Right vetricular wall thickness was not assessed. Right ventricular systolic function is normal. Left Atrium: Left atrial size was normal in size. Right Atrium: Right atrial size was normal in size. Pericardium: There is no evidence of pericardial effusion. Mitral Valve: The mitral valve is normal in structure. Trivial mitral valve regurgitation. Tricuspid Valve: The tricuspid valve is normal in structure. Tricuspid valve regurgitation is trivial. Aortic Valve: The aortic valve is tricuspid. Aortic valve regurgitation is not visualized. Pulmonic Valve: The pulmonic valve was not well visualized. Pulmonic valve regurgitation is not visualized. No evidence of pulmonic stenosis. Aorta: The aortic root is normal in size and structure. Venous: The inferior  vena cava is normal in size with greater than 50% respiratory variability, suggesting right atrial pressure of 3 mmHg. IAS/Shunts: No atrial level shunt detected by color flow Doppler.  LEFT VENTRICLE PLAX 2D LVIDd:         3.60 cm Diastology LVIDs:         2.70 cm LV e' medial:    6.74 cm/s LV PW:         1.20 cm LV E/e' medial:  5.4 LV IVS:        1.10 cm LV e' lateral:   9.90 cm/s                        LV E/e' lateral: 3.6  RIGHT VENTRICLE             IVC RV Basal diam:  2.10 cm     IVC diam: 0.90 cm RV S prime:     12.50 cm/s TAPSE (M-mode): 1.7 cm LEFT ATRIUM             Index        RIGHT ATRIUM          Index LA diam:        2.20 cm 1.34 cm/m   RA Area:     8.61 cm LA Vol (A2C):   15.1 ml 9.21 ml/m   RA Volume:   17.80 ml 10.86 ml/m LA Vol (A4C):   23.0 ml 14.03 ml/m LA Biplane Vol: 19.8 ml 12.08 ml/m  AORTIC VALVE LVOT Vmax:   82.30 cm/s LVOT Vmean:  54.200 cm/s LVOT VTI:    0.108 m MV E velocity: 36.10 cm/s MV A velocity: 87.00 cm/s  SHUNTS MV E/A ratio:  0.41         Systemic VTI: 0.11 m Dorris Carnes MD Electronically signed by Dorris Carnes MD Signature Date/Time: 12/17/2022/4:24:03 PM    Final    DG CHEST PORT 1 VIEW  Result Date: 12/17/2022 CLINICAL DATA:  Tachypnea EXAM: PORTABLE CHEST 1 VIEW COMPARISON:  12/09/2022 FINDINGS: Tracheostomy tube remains in place. Heart size is normal. Minimal atelectatic change in the medial right lung base. Lungs are otherwise clear. No pleural effusion or pneumothorax. IMPRESSION: Minimal atelectatic change in the medial right lung base. Lungs are otherwise clear. Electronically Signed   By: Davina Poke D.O.   On: 12/17/2022 10:08     Medical Consultants:   None.   Subjective:    Damon [name] nonverbal  Objective:    Vitals:   12/19/22 0314 12/19/22 0435 12/19/22 0456 12/19/22 0500  BP: 105/79 111/82    Pulse: (!) 110     Resp: (!) 28     Temp: 99 F (37.2 C)     TempSrc: Axillary     SpO2: 99%  99%   Weight:    62 kg  Height:       SpO2: 99 % O2 Flow Rate (L/min): 5 L/min FiO2 (%): 21 %   Intake/Output Summary (Last 24 hours) at 12/19/2022 0733 Last data filed at 12/19/2022 0546 Gross per 24 hour  Intake 1050 ml  Output 1050 ml  Net 0 ml   Filed Weights   12/17/22 0500 12/18/22 0500 12/19/22 0500  Weight: 59.9 kg 61 kg 62 kg    Exam: General exam: In no acute distress. Respiratory system: Good air movement and clear to auscultation. Cardiovascular system: S1 & S2 heard, RRR. No JVD. Gastrointestinal system: Abdomen is nondistended, soft and nontender.  Extremities: No pedal edema. Skin: No rashes, lesions or ulcers Data Reviewed:    Labs: Basic Metabolic Panel: Recent Labs  Lab 12/14/22 0732 12/16/22 0221 12/18/22 0436  NA 134* 134* 138  K 4.5 4.5 4.3  CL 103 104 106  CO2 '24 22 25  '$ GLUCOSE 114* 126* 140*  BUN 29* 38* 49*  CREATININE 0.68 0.58* 0.66  CALCIUM 9.2 8.8* 9.2  MG 2.1 2.2 2.3  PHOS 4.8*  --  4.5   GFR Estimated Creatinine Clearance: 82.2 mL/min (by C-G  formula based on SCr of 0.66 mg/dL). Liver Function Tests: Recent Labs  Lab 12/14/22 0732  AST 27  ALT 36  ALKPHOS 65  BILITOT 0.4  PROT 7.0  ALBUMIN 2.3*   No results for input(s): "LIPASE", "AMYLASE" in the last 168 hours. No results for input(s): "AMMONIA" in the last 168 hours. Coagulation profile No results for input(s): "INR", "PROTIME" in the last 168 hours. COVID-19 Labs  No results for input(s): "DDIMER", "FERRITIN", "LDH", "CRP" in the last 72 hours.  Lab Results  Component Value Date   [place] NEGATIVE 10/01/2022    CBC: Recent Labs  Lab 12/14/22 0732 12/16/22 0221 12/18/22 0436  WBC 12.7* 14.7* 10.0  NEUTROABS 8.0*  --   --   HGB 11.5* 11.6* 11.3*  HCT 37.0* 36.2* 35.2*  MCV 90.5 88.3 89.6  PLT 279 309 291   Cardiac Enzymes: No results for input(s): "CKTOTAL", "CKMB", "CKMBINDEX", "TROPONINI" in the last 168 hours. BNP (last 3 results) No results for input(s): "PROBNP" in the last 8760 hours. CBG: Recent Labs  Lab 12/16/22 1558 12/17/22 0002 12/17/22 0344 12/17/22 0829 12/18/22 0733  GLUCAP 170* 117* 98 152* 121*   D-Dimer: No results for input(s): "DDIMER" in the last 72 hours. Hgb A1c: No results for input(s): "HGBA1C" in the last 72 hours. Lipid Profile: No results for input(s): "CHOL", "HDL", "LDLCALC", "TRIG", "CHOLHDL", "LDLDIRECT" in the last 72 hours. Thyroid function studies: Recent Labs    12/17/22 0056  TSH 2.352   Anemia work up: No results for input(s): "VITAMINB12", "FOLATE", "FERRITIN", "TIBC", "IRON", "RETICCTPCT" in the last 72 hours. Sepsis Labs: Recent Labs  Lab 12/14/22 0732 12/16/22 0221 12/18/22 0436  WBC 12.7* 14.7* 10.0   Microbiology Recent Results (from the past 240 hour(s))  Culture, Respiratory w Gram Stain     Status: None   Collection Time: 12/09/22  4:45 PM   Specimen: Tracheal Aspirate; Respiratory  Result Value Ref Range Status   Specimen Description TRACHEAL ASPIRATE  Final   Special  Requests NONE  Final   Gram Stain   Final    MODERATE WBC PRESENT,BOTH PMN AND MONONUCLEAR RARE GRAM NEGATIVE RODS Performed at [facility], [place] [address], [place], [place] [address]    Culture FEW PSEUDOMONAS AERUGINOSA  Final   Report Status 12/12/2022 FINAL  Final   Organism ID, Bacteria PSEUDOMONAS AERUGINOSA  Final      Susceptibility   Pseudomonas aeruginosa - MIC*    CEFTAZIDIME 4 SENSITIVE Sensitive     CIPROFLOXACIN <=0.25 SENSITIVE Sensitive     GENTAMICIN <=1 SENSITIVE Sensitive     IMIPENEM >=16 RESISTANT Resistant     PIP/TAZO 16 SENSITIVE Sensitive     CEFEPIME 4 SENSITIVE Sensitive     * FEW PSEUDOMONAS AERUGINOSA  MRSA Next Gen by PCR, Nasal     Status: None   Collection Time: 12/09/22  5:25 PM   Specimen: Nasal Mucosa; Nasal Swab  Result Value Ref Range  Status   MRSA by PCR Next Gen NOT DETECTED NOT DETECTED Final    Comment: (NOTE) The GeneXpert MRSA Assay (FDA approved for NASAL specimens only), is one component of a comprehensive MRSA colonization surveillance program. It is not intended to diagnose MRSA infection nor to guide or monitor treatment for MRSA infections. Test performance is not FDA approved in patients less than 84 years old. Performed at [facility], [place] [address] [place], [place] [address]   Culture, Respiratory w Gram Stain     Status: None   Collection Time: 12/11/22  8:29 AM   Specimen: Tracheal Aspirate; Respiratory  Result Value Ref Range Status   Specimen Description TRACHEAL ASPIRATE  Final   Special Requests NONE  Final   Gram Stain   Final    ABUNDANT WBC PRESENT, PREDOMINANTLY PMN NO ORGANISMS SEEN Performed at [facility], [address], [place], [place] [address]    Culture FEW PSEUDOMONAS AERUGINOSA  Final   Report Status 12/13/2022 FINAL  Final   Organism ID, Bacteria PSEUDOMONAS AERUGINOSA  Final      Susceptibility   Pseudomonas aeruginosa - MIC*    CEFTAZIDIME 4 SENSITIVE Sensitive      CIPROFLOXACIN <=0.25 SENSITIVE Sensitive     GENTAMICIN <=1 SENSITIVE Sensitive     IMIPENEM >=16 RESISTANT Resistant     PIP/TAZO 16 SENSITIVE Sensitive     CEFEPIME 2 SENSITIVE Sensitive     * FEW PSEUDOMONAS AERUGINOSA     Medications:    Chlorhexidine Gluconate Cloth  6 each Topical Daily   clonazepam  0.5 mg Oral BID   enoxaparin (LOVENOX) injection  40 mg Subcutaneous Q24H   famotidine  20 mg Per Tube QHS   feeding supplement (PROSource TF20)  60 mL Per Tube BID   fiber supplement (BANATROL TF)  60 mL Per Tube BID   free water  200 mL Per Tube Q6H   gabapentin  100 mg Per Tube Q8H   guaiFENesin  400 mg Per Tube Q8H   leptospermum manuka honey  1 Application Topical QHS   levETIRAcetam  1,500 mg Per Tube BID   midodrine  5 mg Per Tube Q8H   nutrition supplement (JUVEN)  1 packet Per Tube BID BM   mouth rinse  15 mL Mouth Rinse 4 times per day   propranolol  60 mg Per Tube Q8H   scopolamine  1 patch Transdermal Q72H   tobramycin (PF)  300 mg Nebulization BID   valproic acid  500 mg Per Tube Q8H   Continuous Infusions:  sodium chloride 250 mL (12/13/22 0041)   feeding supplement (OSMOLITE 1.5 CAL) 1,000 mL (12/18/22 1925)      LOS: 77 days   Charlynne Cousins  [facility]  12/19/2022, 7:33 AM

## 2022-12-19 NOTE — Progress Notes (Signed)
Nutrition Follow-up  DOCUMENTATION CODES:   Non-severe (moderate) malnutrition in context of acute illness/injury  INTERVENTION:   Continue tube feeds via PEG: - Osmolite 1.5 @ 70 ml/hr (1680 ml/day) - PROSource TF20 60 ml BID - Free water flushes per MD, currently 200 ml q 6 hours  Tube feeding regimen and current free water flushes provide 2680 kcal, 145 grams of protein, and 2080 ml of H2O.   - Increase Banatrol TF to TID per tube, each packet provides 5 g of soluble fiber, to thicken stool consistency as pt still with rectal tube in place  - Continue Juven BID per tube, each packet provides 80 calories, 8 grams of carbohydrate, 2.5 grams of protein (collagen), 7 grams of L-arginine and 7 grams of L-glutamine; supplement contains CaHMB, Vitamins C, E, B12 and Zinc to promote wound healing  NUTRITION DIAGNOSIS:   Moderate Malnutrition related to acute illness as evidenced by mild fat depletion, moderate fat depletion, severe fat depletion, severe muscle depletion, percent weight loss.  Ongoing, being addressed via TF regimen  GOAL:   Patient will meet greater than or equal to 90% of their needs  Met via TF regimen  MONITOR:   Labs, Weight trends, TF tolerance, Skin, I & O's  REASON FOR ASSESSMENT:   Consult Enteral/tube feeding initiation and management  ASSESSMENT:   Pt admitted from home with the flu leading to acute respiratory failure with hypoxia and PEA arrest upon admission. PMH significant for HTN, HLD, chronic back pain, tobacco use.  Pt remains on trach collar.  Spoke with RN at bedside. Pt tolerating tube feeds without issue. Pt with rectal tube in place and 800 ml output documented thus far this shift. Will increase Banatrol TF from BID to TID and continue current TF regimen. Weight trending up over the last week.  Admit weight: 64.9 kg on 12/20 Current weight: 62 kg  Current TF: Osmolite 1.5 @ 70 ml/hr, PROSource TF20 60 ml BID, free water 200 ml q 6  hours  Medications reviewed and include: pepcid, banatrol TF 60 ml BID, Juven BID, scopolamine patch  Labs reviewed: BUN 49 CBG's: 121-152 x 48 hours  UOP: 1350 ml x 24 hours I/O's: +11.2 L since admit  Diet Order:   Diet Order     None       EDUCATION NEEDS:   No education needs have been identified at this time  Skin:  Skin Assessment: Skin Integrity Issues: Unstageable: sacrum (8cm x 1.5 cm x 0 cm) - WOC evaluated on 1/17 MASD: perineum  Last BM:  12/19/22 small type 7 via rectal tube/pouch  Height:   Ht Readings from Last 1 Encounters:  10/11/22 '5\' 4"'$  (1.626 m)    Weight:   Wt Readings from Last 1 Encounters:  12/19/22 62 kg    BMI:  Body mass index is 23.46 kg/m.  Estimated Nutritional Needs:   Kcal:  2500-2700 kcals  Protein:  125-150 grams  Fluid:  >/= 2 L/day    Gustavus Bryant, MS, RD, LDN Inpatient Clinical Dietitian Please see AMiON for contact information.

## 2022-12-19 NOTE — Evaluation (Signed)
Physical Therapy Evaluation Patient Details Name: Damon [name] MRN: [id number] DOB: 1960-10-19 Today's Date: 12/19/2022  History of Present Illness  PT is a 62 yo male admitted to [facility] with flu on 10/05/2022. Pt coded 10/06/23 and was intubated. Trach and PEG placed 11/01/22. MRI of brain revealed "Fairly symmetric diffusion-weighted and T2 FLAIR hyperintense  signal abnormality within the bilateral basal ganglia. Subtle signal  changes are also suspected within the bilateral hippocampi. Given  the provided history, these findings are compatible with acute  hypoxic/ischemic injury". PMH: HLD, HTN, back pain.  Clinical Impression  Patient continues to not be able to follow commands and with no noted active movement.  Now, however with R knee extension contracture, bilateral ankle PF contractures and L shoulder limited elevation.  He sat at EOB with total A and was able to expectorate some sputum.  Also did demonstrate some focused attention after several seconds with loud stimulation with R eye contact on the L side.  Patient remains most appropriate for LTC without PT follow up, but given new contractures and some very limited attention will follow in acute setting for trial to see if able to progress attention/awareness and for caregiver education with ROM techniques.        Recommendations for follow up therapy are one component of a multi-disciplinary discharge planning process, led by the attending physician.  Recommendations may be updated based on patient status, additional functional criteria and insurance authorization.  Follow Up Recommendations Long-term institutional care without follow-up therapy Can patient physically be transported by private vehicle: No    Assistance Recommended at Discharge Frequent or constant Supervision/Assistance  Patient can return home with the following  Two people to help with walking and/or transfers;Two people to help with bathing/dressing/bathroom;Assistance  with cooking/housework;Direct supervision/assist for medications management;Direct supervision/assist for financial management;Assistance with feeding;Assist for transportation;Help with stairs or ramp for entrance    Equipment Recommendations Wheelchair (measurements PT);Wheelchair cushion (measurements PT);Hospital bed;Other (comment) (hoyer lift, air mattress)  Recommendations for Other Services       Functional Status Assessment Patient has had a recent decline in their functional status and/or demonstrates limited ability to make significant improvements in function in a reasonable and predictable amount of time     Precautions / Restrictions Precautions Precautions: Fall;Other (comment) Precaution Comments: PEG (abdominal binder); Trach; rectal tube      Mobility  Bed Mobility Overal bed mobility: Needs Assistance Bed Mobility: Supine to Sit, Sit to Supine     Supine to sit: Total assist Sit to supine: Total assist   General bed mobility comments: dependent for up to EOB moving legs and lifting trunk and scooting hips; assist for legs and trunk to supine and +2 for repositioning    Transfers                        Ambulation/Gait                  Stairs            Wheelchair Mobility    Modified Rankin (Stroke Patients Only)       Balance Overall balance assessment: Needs assistance Sitting-balance support: Feet supported Sitting balance-[name]: Zero Sitting balance - Comments: assist for head and trunk control in sitting, pt coughing initially with head resting on his chest, improved with head support  Pertinent Vitals/Pain Pain Assessment Pain Assessment: Faces Faces Pain Scale: No hurt    Home Living Family/patient expects to be discharged to:: Skilled nursing facility Living Arrangements: Spouse/significant other;Children Available Help at Discharge: Family;Available 24  hours/day               Additional Comments: unusure of home set up or needs. Pt unable to answer questions and no family present.    Prior Function Prior Level of Function : Independent/Modified Independent                     Hand Dominance        Extremity/Trunk Assessment   Upper Extremity Assessment RUE Deficits / Details: PROM WFL. No active movement noted. LUE Deficits / Details: PROM with limited shouilder elevation noted, No active movement    Lower Extremity Assessment Lower Extremity Assessment: RLE deficits/detail;LLE deficits/detail RLE Deficits / Details: PROM with knee flexion only to about 30 degrees with some extensor tone noted during L LE ROM and bilateral ankle DF, tight heel cords noted as well LLE Deficits / Details: PROM WFL, no noted active movement       Communication   Communication: Tracheostomy;Other (comment) (no communication efforts noted)  Cognition Arousal/Alertness: Awake/alert Behavior During Therapy: Flat affect Overall Cognitive Status: Impaired/Different from baseline                                 General Comments: no communication efforts, did make eye contact after several seconds for attention, but only on L side with R eye (dysconjugate gaze)        General Comments General comments (skin integrity, edema, etc.): VSS during session, able to cough up some white sputum during initial sitting; R leg not on the floor with limited knee flexion    Exercises Other Exercises Other Exercises: PROM UE/LE in supine   Assessment/Plan    PT Assessment Patient needs continued PT services  PT Problem List Decreased range of motion;Decreased mobility;Decreased balance       PT Treatment Interventions Patient/family education;Therapeutic activities;Therapeutic exercise;Manual techniques    PT Goals (Current goals can be found in the Care Plan section)  Acute Rehab PT Goals Patient Stated Goal: unable PT Goal  Formulation: Patient unable to participate in goal setting Time For Goal Achievement: 01/02/23 Potential to Achieve Goals: Poor    Frequency Min 1X/week     Co-evaluation               AM-PAC PT "6 Clicks" Mobility  Outcome Measure Help needed turning from your back to your side while in a flat bed without using bedrails?: Total Help needed moving from lying on your back to sitting on the side of a flat bed without using bedrails?: Total Help needed moving to and from a bed to a chair (including a wheelchair)?: Total Help needed standing up from a chair using your arms (e.g., wheelchair or bedside chair)?: Total Help needed to walk in hospital room?: Total Help needed climbing 3-5 steps with a railing? : Total 6 Click Score: 6    End of Session Equipment Utilized During Treatment: Oxygen Activity Tolerance: Patient tolerated treatment well Patient left: in bed Nurse Communication: Mobility status PT Visit Diagnosis: Other symptoms and signs involving the nervous system (R29.898);Other abnormalities of gait and mobility (R26.89)    Time: 1050-1120 PT Time Calculation (min) (ACUTE ONLY): 30 min   Charges:  PT Evaluation $PT Re-evaluation: 1 Re-eval PT Treatments $Therapeutic Activity: 8-22 mins        Magda Kiel, PT Acute Rehabilitation Services Office:[phone] 12/19/2022   Reginia Naas 12/19/2022, 11:37 AM

## 2022-12-20 DIAGNOSIS — G931 Anoxic brain damage, not elsewhere classified: Secondary | ICD-10-CM | POA: Diagnosis not present

## 2022-12-20 DIAGNOSIS — J101 Influenza due to other identified influenza virus with other respiratory manifestations: Secondary | ICD-10-CM | POA: Diagnosis not present

## 2022-12-20 DIAGNOSIS — J9601 Acute respiratory failure with hypoxia: Secondary | ICD-10-CM | POA: Diagnosis not present

## 2022-12-20 LAB — GLUCOSE, CAPILLARY
Glucose-Capillary: 105 mg/dL — ABNORMAL HIGH (ref 70–99)
Glucose-Capillary: 135 mg/dL — ABNORMAL HIGH (ref 70–99)

## 2022-12-20 MED ORDER — METOPROLOL TARTRATE 50 MG PO TABS
50.0000 mg | ORAL_TABLET | Freq: Two times a day (BID) | ORAL | Status: DC
  Administered 2022-12-20 (×2): 50 mg
  Filled 2022-12-20 (×2): qty 1

## 2022-12-20 NOTE — Progress Notes (Signed)
[facility] PROGRESS NOTE    Progress Note  Damon [name]  [id number] DOB: [birth year] DOA: 10/03/2022 PCP: [facility]     Brief Narrative:   Damon [name] is an 62 y.o. male past medical history of hypertension, hyperlipidemia tobacco abuse, chronic back pain comes into the [place] long ED on 10/03/2022 for influenza A protracted hospitalization  Significant Events: 12/18 Flu A positive 12/20 Admitted, bipap, 5 minute code MRSA PCR - neg Urine strep _POS 12/21 Intubated, paralyzed, ARDS protocol, on 7cc/kg 12/22 Weaned to 6 cc/kg with dyssynchrony, back on 7 cc/kg, driving pressures good, weaning vent 12/23 Attempt to wean sedation again with dyssynchrony and desaturation.  Some concern for cuff leak, tube exchange.  Cuff did not appear to be blown.  Ongoing signs of cuff leak, query leaking around cough, possible large trachea, diuresing 12/25 tolerating PSV, myoclonus> ceribell, Neuro consult, changed to propofol, diuresed  12/26 Changed to DNR. On Propofol, versed.  Tolerating PSV.  MRI brain with hypoxic/anoxic injury Blood culture neg 12/27 GPD's on EEG, pending transfer to [facility] for cEEG NEURO CONSULT 12/28 moved to [facility] for LTM EEG 12/31 family updated by neuro: no chance for meaningful recovery, they should make plans for comfort measures 1/1 cultures - resp normal flora 1/2 eeg still with epileptogenicity. Palli fam mtg -- family does not want to hear from medical teams unless there is "good news"  1/3  cEEG with burst suppression w highly epileptiform bursts.  1/4 cEEG with burst suppression  + highly epileptiform bursts still.  WBC slightly up to 22.5. Temp high 99 low 100  c-EEG stopped duie to lack of benefit Pallative care consult - wife upset about early dc from ER on 12/18 Chaplain consult: Wife is sufering from anticipatory grief + accepting god's way + curious/anger about events leading upto current state EEG: generalized epileptogenicity with  high potential for seizures as well as profound diffuse encephalopathy. In the setting of cardiac arrest, this EEG pattern is suggestive of anoxic/hypoxic brain injury.  1/5 - Low grade fever +. [place] 22.5K.  on Zosyn. On vent fio2 30%. TF +. Per RN - diprivan stopped yesterday. No myoclonus today. Mild tachycardia + Rpt pall care on 10/21/22 per wife 1/6 - On vent FiO2 30%.  Tmax 99,39F ., WBC better. Still off diprivan -> No myoclonus. cXR visualized - devices in place and faiure clear Oral oxy and klonpin stopped 1/8 No significant changes in neuro status. Tachycardic to 130s, hypertensive to 140s. Coreg transitioned to metoprolol. 1/11 wife considering trach/peg, to discuss with surgery-discussed with Dr. Bobbye Morton 1/12 more tachycardic 1/16-spoke with wife at length at bedside, Dr. Hulen Skains also had a long conversation with spouse-desires to continue current aggressive care and desires PEG and trach placed 1/18 perc trach and peg 11/05/2022 transferred to floor due to need for ICU bed 11/08/2022 transition to [facility] with pulmonary following weekly for trach 2/4 recurrent fever, cultures collected. 2/5 changed to cuffless trach 2/18 antibiotics started for Pseudomonas on tracheal aspirate cultre 2/22 concern for myoclonic seizure, neuro reconsulted 2/26 transferred to ICU for secretions.  3/1-3/3: Respiratory status has improved.  Completed 10 days of Zosyn. Okay for transfer back to progressive floor.  Assessment/Plan:   Goals of care: IPAL from 11/07/2022, she has been DNR but transition back to full code. Remains full code persistent discussion with spouse by different providers.  Anoxic brain injury/myoclonic seizure activity: Neurology had a long discussion with the family on 10/14/2022 that she has sustained  irreversible anoxic brain injury that is catastrophic family was encouraged to move towards comfort measures. EEG on 11/16/2022 show myoclonic seizures. Neurology  reconsulted due to right lower extremity movement: Ativan was added to her regimen. Last seen by neurology on 12/08/2022, LTM discontinued by neurology. Patient is currently on Keppra, Depakote and clonazepam. Wife is concerned about excessive sedation, and her dose of clonazepam was decreased on 12/18/2022. Patient has had no neurological improvement in the last weeks.  Acute hypoxic respiratory failure/ARDS due to influenza A and superimposed pneumococcal pneumonia: She completed course of Tamiflu, Rocephin then 7 days of Zosyn. 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  Pulmonary following on a weekly basis tracheostomy was exchanged on 12/13/2022. Has been experiencing significant secretions requiring suctioning. Patient was transferred back to the ICU due to to significant secretions, underwent chest physiotherapy. On 12/13/2022 was cleared by pulmonary to transfer out of the ICU. Repeated chest x-ray on 12/17/2022 showed no acute findings  Pseudomonas pneumonia versus tracheitis: Culture on 12/01/2022 Pseudomonas resistant to imipenem. She completed 10-day course of Zosyn on 12/16/2022.  She also received a few days of levofloxacin. Patient is an tobramycin nebulizer treatment.  PEA arrest: Continue cardiac monitoring.  Recurrent fever/leukocytosis: Has had intermittent fevers throughout her hospital stay she has received multiple courses of antibiotics. She has remained afebrile, and her leukocytosis has resolved. She was also treated with antifungal her last dose of Diflucan on 12/18/2022.  Paroxysmal sympathetic hyperactivity/sinus tachycardia: Leading to sinus tachycardia and hypotension. Has been on propranolol and Neurontin.  Dose of propranolol was increased, can continue metoprolol IV as needed. Heart rate has been slightly higher than usual her dose of propranolol was increased on 12/18/2022 this morning is slightly improved. TSH unremarkable. Echocardiogram shows a low EF without  significant valvular abnormalities.  Constipation: Laxatives were held due to frequent bowel movements.  Hypotension: Continue midodrine blood pressure stable.  Severe protein caloric malnutrition: Continue tube feedings, PEG tube placed on 11/01/2022.  Prediabetes mellitus: A1c of 5.9 not requiring sliding scale for several days continue CBGs check.  Hypovolemic hyponatremia: Resolved with increased free water.  Diarrhea: Has resolved rectal tube in place will discontinue.  Sacral decubitus ulcer stage II RN Pressure Injury Documentation: Pressure Injury 10/12/22 Sacrum Mid;Upper Stage 2 -  Partial thickness loss of dermis presenting as a shallow open injury with a red, pink wound bed without slough. (Active)  10/12/22 0200  Location: Sacrum  Location Orientation: Mid;Upper  Staging: Stage 2 -  Partial thickness loss of dermis presenting as a shallow open injury with a red, pink wound bed without slough.  Wound Description (Comments):   Present on Admission: Yes  Dressing Type Foam - Lift dressing to assess site every shift 12/19/22 2158    DVT prophylaxis: lovenox Family Communication: not at Wife, previous physician Met with wife today at 76 AM. Gave her results of the ECHO. She mentioned that she thinks patient is improving because he now has his eyes open. She mentions that all providers have been lying to her about his chances of recovery. She wanted to know the names of all of his medications which were provided to her. She asked about PT for the patient. She wants PT to work him. She continues to believe that patient will recover. She mentioned that she likes to meet with providers in person so that she can "see when they are lying". She didn't have any further questions.  Status is: Inpatient Remains inpatient appropriate because: Devastating anoxic brain injury.  Code Status:     Code Status Orders  (From admission, onward)           Start     Ordered    11/07/22 1116  Full code  Continuous       Question:  By:  Answer:  Consent: discussion documented in EHR   11/07/22 1115           Code Status History     Date Active Date Inactive Code Status Order ID Comments User Context   10/09/2022 1851 11/07/2022 1115 DNR [id number]  Garner Nash, DO Inpatient   10/03/2022 0808 10/09/2022 1850 Full Code [id number]  Jonnie Finner, DO ED         IV Access:   Peripheral IV   Procedures and diagnostic studies:   No results found.   Medical Consultants:   None.   Subjective:    Damon [name] nonverbal  Objective:    Vitals:   12/19/22 1530 12/19/22 1944 12/19/22 2329 12/20/22 0315  BP: 97/74 (!) 105/91 105/78 98/69  Pulse: (!) 104 (!) 115 (!) 112 (!) 111  Resp: (!) 21 (!) 24 (!) 28 (!) 22  Temp: 99.3 F (37.4 C) 98.8 F (37.1 C) 98.6 F (37 C) 99 F (37.2 C)  TempSrc: Axillary Axillary Axillary Oral  SpO2: 97% 94% 95% 95%  Weight:      Height:       SpO2: 95 % O2 Flow Rate (L/min): 6 L/min FiO2 (%): 21 %   Intake/Output Summary (Last 24 hours) at 12/20/2022 0713 Last data filed at 12/20/2022 [id number] Gross per 24 hour  Intake --  Output 1800 ml  Net -1800 ml    Filed Weights   12/17/22 0500 12/18/22 0500 12/19/22 0500  Weight: 59.9 kg 61 kg 62 kg    Exam: General exam: In no acute distress. Respiratory system: Good air movement and clear to auscultation. Cardiovascular system: S1 & S2 heard, RRR. No JVD. Gastrointestinal system: Abdomen is nondistended, soft and nontender.  Extremities: No pedal edema. Skin: No rashes, lesions or ulcers Data Reviewed:    Labs: Basic Metabolic Panel: Recent Labs  Lab 12/14/22 0732 12/16/22 0221 12/18/22 0436  NA 134* 134* 138  K 4.5 4.5 4.3  CL 103 104 106  CO2 '24 22 25  '$ GLUCOSE 114* 126* 140*  BUN 29* 38* 49*  CREATININE 0.68 0.58* 0.66  CALCIUM 9.2 8.8* 9.2  MG 2.1 2.2 2.3  PHOS 4.8*  --  4.5    GFR Estimated Creatinine Clearance: 82.2 mL/min (by  C-G formula based on SCr of 0.66 mg/dL). Liver Function Tests: Recent Labs  Lab 12/14/22 0732  AST 27  ALT 36  ALKPHOS 65  BILITOT 0.4  PROT 7.0  ALBUMIN 2.3*    No results for input(s): "LIPASE", "AMYLASE" in the last 168 hours. No results for input(s): "AMMONIA" in the last 168 hours. Coagulation profile No results for input(s): "INR", "PROTIME" in the last 168 hours. COVID-19 Labs  No results for input(s): "DDIMER", "FERRITIN", "LDH", "CRP" in the last 72 hours.  Lab Results  Component Value Date   [place] NEGATIVE 10/01/2022    CBC: Recent Labs  Lab 12/14/22 0732 12/16/22 0221 12/18/22 0436  WBC 12.7* 14.7* 10.0  NEUTROABS 8.0*  --   --   HGB 11.5* 11.6* 11.3*  HCT 37.0* 36.2* 35.2*  MCV 90.5 88.3 89.6  PLT 279 309 291    Cardiac Enzymes: No results for input(s): "CKTOTAL", "CKMB", "CKMBINDEX", "TROPONINI"  in the last 168 hours. BNP (last 3 results) No results for input(s): "PROBNP" in the last 8760 hours. CBG: Recent Labs  Lab 12/16/22 1558 12/17/22 0002 12/17/22 0344 12/17/22 0829 12/18/22 0733  GLUCAP 170* 117* 98 152* 121*    D-Dimer: No results for input(s): "DDIMER" in the last 72 hours. Hgb A1c: No results for input(s): "HGBA1C" in the last 72 hours. Lipid Profile: No results for input(s): "CHOL", "HDL", "LDLCALC", "TRIG", "CHOLHDL", "LDLDIRECT" in the last 72 hours. Thyroid function studies: No results for input(s): "TSH", "T4TOTAL", "T3FREE", "THYROIDAB" in the last 72 hours.  Invalid input(s): "FREET3"  Anemia work up: No results for input(s): "VITAMINB12", "FOLATE", "FERRITIN", "TIBC", "IRON", "RETICCTPCT" in the last 72 hours. Sepsis Labs: Recent Labs  Lab 12/14/22 0732 12/16/22 0221 12/18/22 0436  WBC 12.7* 14.7* 10.0    Microbiology Recent Results (from the past 240 hour(s))  Culture, Respiratory w Gram Stain     Status: None   Collection Time: 12/11/22  8:29 AM   Specimen: Tracheal Aspirate; Respiratory  Result  Value Ref Range Status   Specimen Description TRACHEAL ASPIRATE  Final   Special Requests NONE  Final   Gram Stain   Final    ABUNDANT WBC PRESENT, PREDOMINANTLY PMN NO ORGANISMS SEEN Performed at [facility], [address], [place], [place] [address]    Culture FEW PSEUDOMONAS AERUGINOSA  Final   Report Status 12/13/2022 FINAL  Final   Organism ID, Bacteria PSEUDOMONAS AERUGINOSA  Final      Susceptibility   Pseudomonas aeruginosa - MIC*    CEFTAZIDIME 4 SENSITIVE Sensitive     CIPROFLOXACIN <=0.25 SENSITIVE Sensitive     GENTAMICIN <=1 SENSITIVE Sensitive     IMIPENEM >=16 RESISTANT Resistant     PIP/TAZO 16 SENSITIVE Sensitive     CEFEPIME 2 SENSITIVE Sensitive     * FEW PSEUDOMONAS AERUGINOSA     Medications:    Chlorhexidine Gluconate Cloth  6 each Topical Daily   clonazepam  0.5 mg Oral BID   enoxaparin (LOVENOX) injection  40 mg Subcutaneous Q24H   famotidine  20 mg Per Tube QHS   feeding supplement (PROSource TF20)  60 mL Per Tube BID   fiber supplement (BANATROL TF)  60 mL Per Tube TID   free water  200 mL Per Tube Q6H   gabapentin  100 mg Per Tube Q8H   guaiFENesin  400 mg Per Tube Q8H   leptospermum manuka honey  1 Application Topical QHS   levETIRAcetam  1,500 mg Per Tube BID   midodrine  5 mg Per Tube Q8H   nutrition supplement (JUVEN)  1 packet Per Tube BID BM   mouth rinse  15 mL Mouth Rinse 4 times per day   propranolol  60 mg Per Tube Q8H   scopolamine  1 patch Transdermal Q72H   tobramycin (PF)  300 mg Nebulization BID   valproic acid  500 mg Per Tube Q8H   Continuous Infusions:  sodium chloride 250 mL (12/13/22 0041)   feeding supplement (OSMOLITE 1.5 CAL) 1,000 mL (12/20/22 0204)      LOS: 78 days   Charlynne Cousins  [facility]  12/20/2022, 7:13 AM

## 2022-12-21 DIAGNOSIS — J9601 Acute respiratory failure with hypoxia: Secondary | ICD-10-CM | POA: Diagnosis not present

## 2022-12-21 DIAGNOSIS — J101 Influenza due to other identified influenza virus with other respiratory manifestations: Secondary | ICD-10-CM | POA: Diagnosis not present

## 2022-12-21 DIAGNOSIS — G931 Anoxic brain damage, not elsewhere classified: Secondary | ICD-10-CM | POA: Diagnosis not present

## 2022-12-21 LAB — CBC WITH DIFFERENTIAL/PLATELET
Abs Immature Granulocytes: 0.38 10*3/uL — ABNORMAL HIGH (ref 0.00–0.07)
Basophils Absolute: 0.1 10*3/uL (ref 0.0–0.1)
Basophils Relative: 1 %
Eosinophils Absolute: 0.2 10*3/uL (ref 0.0–0.5)
Eosinophils Relative: 3 %
HCT: 36.7 % — ABNORMAL LOW (ref 39.0–52.0)
Hemoglobin: 11.4 g/dL — ABNORMAL LOW (ref 13.0–17.0)
Immature Granulocytes: 4 %
Lymphocytes Relative: 21 %
Lymphs Abs: 2 10*3/uL (ref 0.7–4.0)
MCH: 28.2 pg (ref 26.0–34.0)
MCHC: 31.1 g/dL (ref 30.0–36.0)
MCV: 90.8 fL (ref 80.0–100.0)
Monocytes Absolute: 1.4 10*3/uL — ABNORMAL HIGH (ref 0.1–1.0)
Monocytes Relative: 15 %
Neutro Abs: 5.4 10*3/uL (ref 1.7–7.7)
Neutrophils Relative %: 56 %
Platelets: 314 10*3/uL (ref 150–400)
RBC: 4.04 MIL/uL — ABNORMAL LOW (ref 4.22–5.81)
RDW: 15.4 % (ref 11.5–15.5)
WBC: 9.6 10*3/uL (ref 4.0–10.5)
nRBC: 0 % (ref 0.0–0.2)

## 2022-12-21 LAB — BASIC METABOLIC PANEL
Anion gap: 11 (ref 5–15)
BUN: 42 mg/dL — ABNORMAL HIGH (ref 6–20)
CO2: 24 mmol/L (ref 22–32)
Calcium: 9.5 mg/dL (ref 8.9–10.3)
Chloride: 103 mmol/L (ref 98–111)
Creatinine, Ser: 0.57 mg/dL — ABNORMAL LOW (ref 0.61–1.24)
GFR, Estimated: >60 mL/min (ref >60)
Glucose, Bld: 96 mg/dL (ref 70–99)
Potassium: 4.4 mmol/L (ref 3.5–5.1)
Sodium: 138 mmol/L (ref 135–145)

## 2022-12-21 LAB — GLUCOSE, CAPILLARY
Glucose-Capillary: 139 mg/dL — ABNORMAL HIGH (ref 70–99)
Glucose-Capillary: 134 mg/dL — ABNORMAL HIGH (ref 70–99)
Glucose-Capillary: 99 mg/dL (ref 70–99)
Glucose-Capillary: 114 mg/dL — ABNORMAL HIGH (ref 70–99)
Glucose-Capillary: 106 mg/dL — ABNORMAL HIGH (ref 70–99)
Glucose-Capillary: 115 mg/dL — ABNORMAL HIGH (ref 70–99)
Glucose-Capillary: 108 mg/dL — ABNORMAL HIGH (ref 70–99)
Glucose-Capillary: 126 mg/dL — ABNORMAL HIGH (ref 70–99)

## 2022-12-21 MED ORDER — METOPROLOL TARTRATE 5 MG/5ML IV SOLN
5.0000 mg | INTRAVENOUS | Status: DC | PRN
  Administered 2022-12-22: 5 mg via INTRAVENOUS
  Filled 2022-12-21: qty 5

## 2022-12-21 MED ORDER — METOPROLOL TARTRATE 50 MG PO TABS
100.0000 mg | ORAL_TABLET | Freq: Two times a day (BID) | ORAL | Status: DC
  Administered 2022-12-21 – 2022-12-22 (×4): 100 mg
  Filled 2022-12-21 (×4): qty 2

## 2022-12-21 NOTE — Progress Notes (Signed)
[facility] PROGRESS NOTE    Progress Note  Damon [name]  [id number] DOB: [birth year] DOA: 10/03/2022 PCP: [facility]     Brief Narrative:   Damon [name] is an 62 y.o. male past medical history of hypertension, hyperlipidemia tobacco abuse, chronic back pain comes into the [place] [facility] ED on 10/03/2022 for influenza A protracted hospitalization  Significant Events: 12/18 Flu A positive 12/20 Admitted, bipap, 5 minute code MRSA PCR - neg Urine strep _POS 12/21 Intubated, paralyzed, ARDS protocol, on 7cc/kg 12/22 Weaned to 6 cc/kg with dyssynchrony, back on 7 cc/kg, driving pressures good, weaning vent 12/23 Attempt to wean sedation again with dyssynchrony and desaturation.  Some concern for cuff leak, tube exchange.  Cuff did not appear to be blown.  Ongoing signs of cuff leak, query leaking around cough, possible large trachea, diuresing 12/25 tolerating PSV, myoclonus> ceribell, Neuro consult, changed to propofol, diuresed  12/26 Changed to DNR. On Propofol, versed.  Tolerating PSV.  MRI brain with hypoxic/anoxic injury Blood culture neg 12/27 GPD's on EEG, pending transfer to [facility] for cEEG NEURO CONSULT 12/28 moved to [facility] for LTM EEG 12/31 family updated by neuro: no chance for meaningful recovery, they should make plans for comfort measures 1/1 cultures - resp normal flora 1/2 eeg still with epileptogenicity. Palli fam mtg -- family does not want to hear from medical teams unless there is "good news"  1/3  cEEG with burst suppression w highly epileptiform bursts.  1/4 cEEG with burst suppression  + highly epileptiform bursts still.  WBC slightly up to 22.5. Temp high 99 low 100  c-EEG stopped duie to lack of benefit Pallative care consult - wife upset about early dc from ER on 12/18 Chaplain consult: Wife is sufering from anticipatory grief + accepting god's way + curious/anger about events leading upto current state EEG: generalized epileptogenicity with  high potential for seizures as well as profound diffuse encephalopathy. In the setting of cardiac arrest, this EEG pattern is suggestive of anoxic/hypoxic brain injury.  1/5 - Low grade fever +. [place] 22.5K.  on Zosyn. On vent fio2 30%. TF +. Per RN - diprivan stopped yesterday. No myoclonus today. Mild tachycardia + Rpt pall care on 10/21/22 per wife 1/6 - On vent FiO2 30%.  Tmax 99,39F ., WBC better. Still off diprivan -> No myoclonus. cXR visualized - devices in place and faiure clear Oral oxy and klonpin stopped 1/8 No significant changes in neuro status. Tachycardic to 130s, hypertensive to 140s. Coreg transitioned to metoprolol. 1/11 wife considering trach/peg, to discuss with surgery-discussed with Dr. Bobbye Morton 1/12 more tachycardic 1/16-spoke with wife at length at bedside, Dr. Hulen Skains also had a long conversation with spouse-desires to continue current aggressive care and desires PEG and trach placed 1/18 perc trach and peg 11/05/2022 transferred to floor due to need for ICU bed 11/08/2022 transition to [facility] with pulmonary following weekly for trach 2/4 recurrent fever, cultures collected. 2/5 changed to cuffless trach 2/18 antibiotics started for Pseudomonas on tracheal aspirate cultre 2/22 concern for myoclonic seizure, neuro reconsulted 2/26 transferred to ICU for secretions.  3/1-3/3: Respiratory status has improved.  Completed 10 days of Zosyn. Okay for transfer back to progressive floor.  Assessment/Plan:   Goals of care: IPAL from 11/07/2022, she has been DNR but transition back to full code. Remains full code persistent discussion with spouse by different providers.  Anoxic brain injury/myoclonic seizure activity: Neurology had a long discussion with the family on 10/14/2022 that she has sustained  irreversible anoxic brain injury that is catastrophic family was encouraged to move towards comfort measures. EEG on 11/16/2022 show myoclonic seizures. Neurology  reconsulted due to right lower extremity movement: Ativan was added to her regimen. Last seen by neurology on 12/08/2022, LTM discontinued by neurology. Patient is currently on Keppra, Depakote and clonazepam. Clonazepam was decreased on 12/18/2022. Patient has had no neurological improvement in the last weeks.  Acute hypoxic respiratory failure/ARDS due to influenza A and superimposed pneumococcal pneumonia: She completed course of Tamiflu, Rocephin then 7 days of Zosyn. 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  Pulmonary following on a weekly basis tracheostomy was exchanged on 12/13/2022. Has been experiencing significant secretions requiring suctioning. Patient was transferred back to the ICU due to to significant secretions, underwent chest physiotherapy. On 12/13/2022 was cleared by pulmonary to transfer out of the ICU. Repeated chest x-ray on 12/17/2022 showed no acute findings Tmax of 100.  Pseudomonas pneumonia versus tracheitis: Culture on 12/01/2022 Pseudomonas resistant to imipenem. She completed 10-day course of Zosyn on 12/16/2022.  She also received a few days of levofloxacin. Patient is an tobramycin nebulizer treatment.  PEA arrest: Continue cardiac monitoring.  Recurrent fever/leukocytosis: Has had intermittent fevers throughout her hospital stay she has received multiple courses of antibiotics. She has remained afebrile, and his leukocytosis has resolved. She was also treated with antifungal her last dose of Diflucan on 12/18/2022.  Paroxysmal sympathetic hyperactivity/sinus tachycardia: Leading to sinus tachycardia and hypotension. Heart rate has remained elevated, propranolol was discontinued to avoid hypotension and was started on metoprolol 50 mg and titrated up to 100 mg twice a day, with IV as needed as needed. Continue Neurontin.  TSH unremarkable. Echocardiogram shows a low EF without significant valvular abnormalities.  Constipation: Laxatives were held due to  frequent bowel movements.  Hypotension: Continue midodrine blood pressure stable.  Severe protein caloric malnutrition: Continue tube feedings, PEG tube placed on 11/01/2022.  Prediabetes mellitus: A1c of 5.9 not requiring sliding scale for several days continue CBGs check.  Hypovolemic hyponatremia: Resolved with increased free water.  Diarrhea: Has resolved rectal tube in place will discontinue.  Sacral decubitus ulcer stage II RN Pressure Injury Documentation: Pressure Injury 10/12/22 Sacrum Mid;Upper Stage 2 -  Partial thickness loss of dermis presenting as a shallow open injury with a red, pink wound bed without slough. (Active)  10/12/22 0200  Location: Sacrum  Location Orientation: Mid;Upper  Staging: Stage 2 -  Partial thickness loss of dermis presenting as a shallow open injury with a red, pink wound bed without slough.  Wound Description (Comments):   Present on Admission: Yes  Dressing Type Foam - Lift dressing to assess site every shift 12/20/22 0750    DVT prophylaxis: lovenox Family Communication: not at Wife, previous physician Met with wife today at 25 AM. Gave her results of the ECHO. She mentioned that she thinks patient is improving because he now has his eyes open. She mentions that all providers have been lying to her about his chances of recovery. She wanted to know the names of all of his medications which were provided to her. She asked about PT for the patient. She wants PT to work him. She continues to believe that patient will recover. She mentioned that she likes to meet with providers in person so that she can "see when they are lying". She didn't have any further questions.  Status is: Inpatient Remains inpatient appropriate because: Devastating anoxic brain injury.    Code Status:     Code  Status Orders  (From admission, onward)           Start     Ordered   11/07/22 1116  Full code  Continuous       Question:  By:  Answer:  Consent:  discussion documented in EHR   11/07/22 1115           Code Status History     Date Active Date Inactive Code Status Order ID Comments User Context   10/09/2022 1851 11/07/2022 1115 DNR [id number]  Garner Nash, DO Inpatient   10/03/2022 0808 10/09/2022 1850 Full Code [id number]  Jonnie Finner, DO ED         IV Access:   Peripheral IV   Procedures and diagnostic studies:   No results found.   Medical Consultants:   None.   Subjective:    Rowe Dain nonverbal  Objective:    Vitals:   12/20/22 1603 12/20/22 1927 12/21/22 0000 12/21/22 0400  BP: 98/81 107/75 94/78 111/83  Pulse: (!) 109     Resp: 19     Temp: 98.8 F (37.1 C) 98 F (36.7 C) 99.3 F (37.4 C) 98 F (36.7 C)  TempSrc: Axillary Axillary Axillary Axillary  SpO2: 97%     Weight:      Height:       SpO2: 97 % O2 Flow Rate (L/min): 6 L/min FiO2 (%): 21 %   Intake/Output Summary (Last 24 hours) at 12/21/2022 0757 Last data filed at 12/20/2022 1749 Gross per 24 hour  Intake --  Output 1450 ml  Net -1450 ml    Filed Weights   12/17/22 0500 12/18/22 0500 12/19/22 0500  Weight: 59.9 kg 61 kg 62 kg    Exam: General exam: In no acute distress. Respiratory system: Good air movement and clear to auscultation. Cardiovascular system: S1 & S2 heard, RRR. No JVD. Gastrointestinal system: Abdomen is nondistended, soft and nontender.  Extremities: No pedal edema. Skin: No rashes, lesions or ulcers Data Reviewed:    Labs: Basic Metabolic Panel: Recent Labs  Lab 12/16/22 0221 12/18/22 0436  NA 134* 138  K 4.5 4.3  CL 104 106  CO2 22 25  GLUCOSE 126* 140*  BUN 38* 49*  CREATININE 0.58* 0.66  CALCIUM 8.8* 9.2  MG 2.2 2.3  PHOS  --  4.5    GFR Estimated Creatinine Clearance: 82.2 mL/min (by C-G formula based on SCr of 0.66 mg/dL). Liver Function Tests: No results for input(s): "AST", "ALT", "ALKPHOS", "BILITOT", "PROT", "ALBUMIN" in the last 168 hours.  No results for  input(s): "LIPASE", "AMYLASE" in the last 168 hours. No results for input(s): "AMMONIA" in the last 168 hours. Coagulation profile No results for input(s): "INR", "PROTIME" in the last 168 hours. COVID-19 Labs  No results for input(s): "DDIMER", "FERRITIN", "LDH", "CRP" in the last 72 hours.  Lab Results  Component Value Date   SARSCOV2NAA NEGATIVE 10/01/2022    CBC: Recent Labs  Lab 12/16/22 0221 12/18/22 0436  WBC 14.7* 10.0  HGB 11.6* 11.3*  HCT 36.2* 35.2*  MCV 88.3 89.6  PLT 309 291    Cardiac Enzymes: No results for input(s): "CKTOTAL", "CKMB", "CKMBINDEX", "TROPONINI" in the last 168 hours. BNP (last 3 results) No results for input(s): "PROBNP" in the last 8760 hours. CBG: Recent Labs  Lab 12/17/22 0344 12/17/22 0829 12/18/22 0733 12/20/22 1606 12/20/22 2312  GLUCAP 98 152* 121* 105* 135*    D-Dimer: No results for input(s): "DDIMER" in the last 72  hours. Hgb A1c: No results for input(s): "HGBA1C" in the last 72 hours. Lipid Profile: No results for input(s): "CHOL", "HDL", "LDLCALC", "TRIG", "CHOLHDL", "LDLDIRECT" in the last 72 hours. Thyroid function studies: No results for input(s): "TSH", "T4TOTAL", "T3FREE", "THYROIDAB" in the last 72 hours.  Invalid input(s): "FREET3"  Anemia work up: No results for input(s): "VITAMINB12", "FOLATE", "FERRITIN", "TIBC", "IRON", "RETICCTPCT" in the last 72 hours. Sepsis Labs: Recent Labs  Lab 12/16/22 0221 12/18/22 0436  WBC 14.7* 10.0    Microbiology Recent Results (from the past 240 hour(s))  Culture, Respiratory w Gram Stain     Status: None   Collection Time: 12/11/22  8:29 AM   Specimen: Tracheal Aspirate; Respiratory  Result Value Ref Range Status   Specimen Description TRACHEAL ASPIRATE  Final   Special Requests NONE  Final   Gram Stain   Final    ABUNDANT WBC PRESENT, PREDOMINANTLY PMN NO ORGANISMS SEEN Performed at [facility], [address] [place], [place] [address]    Culture  FEW PSEUDOMONAS AERUGINOSA  Final   Report Status 12/13/2022 FINAL  Final   Organism ID, Bacteria PSEUDOMONAS AERUGINOSA  Final      Susceptibility   Pseudomonas aeruginosa - MIC*    CEFTAZIDIME 4 SENSITIVE Sensitive     CIPROFLOXACIN <=0.25 SENSITIVE Sensitive     GENTAMICIN <=1 SENSITIVE Sensitive     IMIPENEM >=16 RESISTANT Resistant     PIP/TAZO 16 SENSITIVE Sensitive     CEFEPIME 2 SENSITIVE Sensitive     * FEW PSEUDOMONAS AERUGINOSA     Medications:    Chlorhexidine Gluconate Cloth  6 each Topical Daily   clonazepam  0.5 mg Oral BID   enoxaparin (LOVENOX) injection  40 mg Subcutaneous Q24H   famotidine  20 mg Per Tube QHS   feeding supplement (PROSource TF20)  60 mL Per Tube BID   fiber supplement (BANATROL TF)  60 mL Per Tube TID   free water  200 mL Per Tube Q6H   gabapentin  100 mg Per Tube Q8H   guaiFENesin  400 mg Per Tube Q8H   leptospermum manuka honey  1 Application Topical QHS   levETIRAcetam  1,500 mg Per Tube BID   metoprolol tartrate  50 mg Per Tube BID   midodrine  5 mg Per Tube Q8H   nutrition supplement (JUVEN)  1 packet Per Tube BID BM   mouth rinse  15 mL Mouth Rinse 4 times per day   scopolamine  1 patch Transdermal Q72H   tobramycin (PF)  300 mg Nebulization BID   valproic acid  500 mg Per Tube Q8H   Continuous Infusions:  sodium chloride 250 mL (12/13/22 0041)   feeding supplement (OSMOLITE 1.5 CAL) 1,000 mL (12/20/22 1925)      LOS: 79 days   Charlynne Cousins  [facility]  12/21/2022, 7:57 AM

## 2022-12-21 NOTE — Progress Notes (Signed)
Pt CPT not done at this time due to Pt becoming tachycardic.

## 2022-12-22 DIAGNOSIS — J9601 Acute respiratory failure with hypoxia: Secondary | ICD-10-CM | POA: Diagnosis not present

## 2022-12-22 DIAGNOSIS — G931 Anoxic brain damage, not elsewhere classified: Secondary | ICD-10-CM | POA: Diagnosis not present

## 2022-12-22 DIAGNOSIS — J101 Influenza due to other identified influenza virus with other respiratory manifestations: Secondary | ICD-10-CM | POA: Diagnosis not present

## 2022-12-22 LAB — GLUCOSE, CAPILLARY
Glucose-Capillary: 118 mg/dL — ABNORMAL HIGH (ref 70–99)
Glucose-Capillary: 109 mg/dL — ABNORMAL HIGH (ref 70–99)

## 2022-12-22 MED ORDER — DILTIAZEM HCL 30 MG PO TABS
30.0000 mg | ORAL_TABLET | Freq: Four times a day (QID) | ORAL | Status: DC
  Administered 2022-12-22 – 2022-12-25 (×13): 30 mg via ORAL
  Filled 2022-12-22 (×14): qty 1

## 2022-12-22 NOTE — Progress Notes (Signed)
   Palliative Medicine Inpatient Follow Up Note   PMT has acknowledged the consultation for [facility]. He is well known to the PMT.   Patients spouse has been consistently clear regarding the goals for full code/full scope of care per documentation.  APP - [name] will be back on Monday and can briefly check in with patients family regarding if there has been any consideration in terms of a change in overall goals.   No Charge ______________________________________________________________________________________ [place] Team Team Cell Phone: [phone] Please utilize secure chat with additional questions, if there is no response within 30 minutes please call the above phone number  Palliative Medicine Team providers are available by phone from 7am to 7pm daily and can be reached through the team cell phone.  Should this patient require assistance outside of these hours, please call the patient's attending physician.

## 2022-12-22 NOTE — Progress Notes (Signed)
[facility] PROGRESS NOTE    Progress Note  Damon [name]  [id number] DOB: August 18, 1961 DOA: 10/03/2022 PCP: [facility]     Brief Narrative:   Damon [name] is an 62 y.o. male past medical history of hypertension, hyperlipidemia tobacco abuse, chronic back pain comes into the [place] [facility] ED on 10/03/2022 for influenza A protracted hospitalization  Significant Events: 12/18 Flu A positive 12/20 Admitted, bipap, 5 minute code MRSA PCR - neg Urine strep _POS 12/21 Intubated, paralyzed, ARDS protocol, on 7cc/kg 12/22 Weaned to 6 cc/kg with dyssynchrony, back on 7 cc/kg, driving pressures good, weaning vent 12/23 Attempt to wean sedation again with dyssynchrony and desaturation.  Some concern for cuff leak, tube exchange.  Cuff did not appear to be blown.  Ongoing signs of cuff leak, query leaking around cough, possible large trachea, diuresing 12/25 tolerating PSV, myoclonus> ceribell, Neuro consult, changed to propofol, diuresed  12/26 Changed to DNR. On Propofol, versed.  Tolerating PSV.  MRI brain with hypoxic/anoxic injury Blood culture neg 12/27 GPD's on EEG, pending transfer to [facility] for cEEG NEURO CONSULT 12/28 moved to [facility] for LTM EEG 12/31 family updated by neuro: no chance for meaningful recovery, they should make plans for comfort measures 1/1 cultures - resp normal flora 1/2 eeg still with epileptogenicity. Palli fam mtg -- family does not want to hear from medical teams unless there is "good news"  1/3  cEEG with burst suppression w highly epileptiform bursts.  1/4 cEEG with burst suppression  + highly epileptiform bursts still.  WBC slightly up to 22.5. Temp high 99 low 100  c-EEG stopped duie to lack of benefit Pallative care consult - wife upset about early dc from ER on 12/18 Chaplain consult: Wife is sufering from anticipatory grief + accepting god's way + curious/anger about events leading upto current state EEG: generalized epileptogenicity with  high potential for seizures as well as profound diffuse encephalopathy. In the setting of cardiac arrest, this EEG pattern is suggestive of anoxic/hypoxic brain injury.  1/5 - Low grade fever +. [place] 22.5K.  on Zosyn. On vent fio2 30%. TF +. Per RN - diprivan stopped yesterday. No myoclonus today. Mild tachycardia + Rpt pall care on 10/21/22 per wife 1/6 - On vent FiO2 30%.  Tmax 99,51F ., WBC better. Still off diprivan -> No myoclonus. cXR visualized - devices in place and faiure clear Oral oxy and klonpin stopped 1/8 No significant changes in neuro status. Tachycardic to 130s, hypertensive to 140s. Coreg transitioned to metoprolol. 1/11 wife considering trach/peg, to discuss with surgery-discussed with Dr. Bobbye Morton 1/12 more tachycardic 1/16-spoke with wife at length at bedside, Dr. Hulen Skains also had a long conversation with spouse-desires to continue current aggressive care and desires PEG and trach placed 1/18 perc trach and peg 11/05/2022 transferred to floor due to need for ICU bed 11/08/2022 transition to [facility] with pulmonary following weekly for trach 2/4 recurrent fever, cultures collected. 2/5 changed to cuffless trach 2/18 antibiotics started for Pseudomonas on tracheal aspirate cultre 2/22 concern for myoclonic seizure, neuro reconsulted 2/26 transferred to ICU for secretions.  3/1-3/3: Respiratory status has improved.  Completed 10 days of Zosyn. Okay for transfer back to progressive floor.  Assessment/Plan:   Goals of care: IPAL from 11/07/2022, he has been DNR but transition back to full code. Remains full code persistent discussion with spouse by different providers.  Anoxic brain injury/myoclonic seizure activity: Neurology had a long discussion with the family on 10/14/2022 that she has sustained  irreversible anoxic brain injury that is catastrophic family was encouraged to move towards comfort measures. EEG on 11/16/2022 show myoclonic seizures. Neurology  reconsulted due to right lower extremity movement: Ativan was added to her regimen. Last seen by neurology on 12/08/2022, LTM discontinued by neurology. Patient is currently on Keppra, Depakote and clonazepam. Clonazepam was decreased on 12/18/2022. Patient has had no neurological improvement in the last 2 weeks  Acute hypoxic respiratory failure/ARDS due to influenza A and superimposed pneumococcal pneumonia: She completed course of Tamiflu, Rocephin then 7 days of Zosyn. 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  Pulmonary following on a weekly basis tracheostomy was exchanged on 12/13/2022. Has been experiencing significant secretions requiring suctioning. Patient was transferred back to the ICU due to to significant secretions, underwent chest physiotherapy. On 12/13/2022 was cleared by pulmonary to transfer out of the ICU. Repeated chest x-ray on 12/17/2022 showed no acute findings With no leukocytosis, with a Tmax of 99.0  Pseudomonas pneumonia versus tracheitis: Culture on 12/01/2022 Pseudomonas resistant to imipenem. She completed 10-day course of Zosyn on 12/16/2022.  He also received a few days of levofloxacin. Patient is an tobramycin nebulizer treatment.  PEA arrest: Continue cardiac monitoring.  Recurrent fever/leukocytosis: Has had intermittent fevers throughout her hospital stay she has received multiple courses of antibiotics. She has remained afebrile, and his leukocytosis has resolved. She was also treated with antifungal her last dose of Diflucan on 12/18/2022.  Paroxysmal sympathetic hyperactivity/sinus tachycardia: Leading to sinus tachycardia and hypotension. Will change him to metoprolol 100 mg twice a day, are stable, continues to be around 110 we will start him on low-dose diltiazem. Continue Neurontin.  TSH unremarkable. Echocardiogram shows a low EF without significant valvular abnormalities.  Constipation: Laxatives were held due to frequent bowel  movements.  Hypotension: Continue midodrine blood pressure stable.  Severe protein caloric malnutrition: Continue tube feedings, PEG tube placed on 11/01/2022.  Prediabetes mellitus: A1c of 5.9 not requiring sliding scale for several days continue CBGs check.  Hypovolemic hyponatremia: Resolved with increased free water.  Diarrhea: Has resolved rectal tube in place will discontinue.  Sacral decubitus ulcer stage II RN Pressure Injury Documentation: Pressure Injury 10/12/22 Sacrum Mid;Upper Stage 2 -  Partial thickness loss of dermis presenting as a shallow open injury with a red, pink wound bed without slough. (Active)  10/12/22 0200  Location: Sacrum  Location Orientation: Mid;Upper  Staging: Stage 2 -  Partial thickness loss of dermis presenting as a shallow open injury with a red, pink wound bed without slough.  Wound Description (Comments):   Present on Admission: Yes  Dressing Type Foam - Lift dressing to assess site every shift 12/21/22 2307    DVT prophylaxis: lovenox Family Communication: not at Wife, previous physician the previous physician spoke to the wife. She mentioned that she thinks patient is improving because he now has his eyes open. She mentions that all providers have been lying to her about his chances of recovery. She wanted to know the names of all of his medications which were provided to her. She asked about PT for the patient. She wants PT to work him. She continues to believe that patient will recover. She mentioned that she likes to meet with providers in person so that she can "see when they are lying". She didn't have any further questions.  Status is: Inpatient Remains inpatient appropriate because: Devastating anoxic brain injury.    Code Status:     Code Status Orders  (From admission, onward)  Start     Ordered   11/07/22 1116  Full code  Continuous       Question:  By:  Answer:  Consent: discussion documented in EHR    11/07/22 1115           Code Status History     Date Active Date Inactive Code Status Order ID Comments User Context   10/09/2022 1851 11/07/2022 1115 DNR [id number]  Garner Nash, DO Inpatient   10/03/2022 0808 10/09/2022 1850 Full Code [id number]  Jonnie Finner, DO ED         IV Access:   Peripheral IV   Procedures and diagnostic studies:   No results found.   Medical Consultants:   None.   Subjective:    [place]ac[place] Yeakle nonverbal  Objective:    Vitals:   12/21/22 2307 12/22/22 0300 12/22/22 0608 12/22/22 0721  BP: (!) 111/92 (!) 119/94 (!) 108/92 (!) 122/91  Pulse: (!) 103 (!) 118 (!) 122 (!) 133  Resp: 17 (!) 21 (!) 22 20  Temp: 99 F (37.2 C) 98.8 F (37.1 C)  98.7 F (37.1 C)  TempSrc: Oral Axillary  Axillary  SpO2: 97% 95% 96% 93%  Weight:      Height:       SpO2: 93 % O2 Flow Rate (L/min): 6 L/min FiO2 (%): 21 %   Intake/Output Summary (Last 24 hours) at 12/22/2022 0743 Last data filed at 12/22/2022 0400 Gross per 24 hour  Intake 10289.5 ml  Output 1950 ml  Net 8339.5 ml    Filed Weights   12/18/22 0500 12/19/22 0500 12/21/22 0756  Weight: 61 kg 62 kg 63 kg    Exam: General exam: In no acute distress. Respiratory system: Good air movement and clear to auscultation. Cardiovascular system: S1 & S2 heard, RRR. No JVD. Gastrointestinal system: Abdomen is nondistended, soft and nontender.  Extremities: No pedal edema. Skin: No rashes, lesions or ulcers Data Reviewed:    Labs: Basic Metabolic Panel: Recent Labs  Lab 12/16/22 0221 12/18/22 0436 12/21/22 0819  NA 134* 138 138  K 4.5 4.3 4.4  CL 104 106 103  CO2 '22 25 24  '$ GLUCOSE 126* 140* 96  BUN 38* 49* 42*  CREATININE 0.58* 0.66 0.57*  CALCIUM 8.8* 9.2 9.5  MG 2.2 2.3  --   PHOS  --  4.5  --     GFR Estimated Creatinine Clearance: 82.2 mL/min (A) (by C-G formula based on SCr of 0.57 mg/dL (L)). Liver Function Tests: No results for input(s): "AST", "ALT", "ALKPHOS",  "BILITOT", "PROT", "ALBUMIN" in the last 168 hours.  No results for input(s): "LIPASE", "AMYLASE" in the last 168 hours. No results for input(s): "AMMONIA" in the last 168 hours. Coagulation profile No results for input(s): "INR", "PROTIME" in the last 168 hours. COVID-19 Labs  No results for input(s): "DDIMER", "FERRITIN", "LDH", "CRP" in the last 72 hours.  Lab Results  Component Value Date   [place] NEGATIVE 10/01/2022    CBC: Recent Labs  Lab 12/16/22 0221 12/18/22 0436 12/21/22 0819  WBC 14.7* 10.0 9.6  NEUTROABS  --   --  5.4  HGB 11.6* 11.3* 11.4*  HCT 36.2* 35.2* 36.7*  MCV 88.3 89.6 90.8  PLT 309 291 314    Cardiac Enzymes: No results for input(s): "CKTOTAL", "CKMB", "CKMBINDEX", "TROPONINI" in the last 168 hours. BNP (last 3 results) No results for input(s): "PROBNP" in the last 8760 hours. CBG: Recent Labs  Lab 12/20/22 0824 12/20/22 1606 12/20/22  2312 12/21/22 0826 12/21/22 1555  GLUCAP 115* 105* 135* 108* 126*    D-Dimer: No results for input(s): "DDIMER" in the last 72 hours. Hgb A1c: No results for input(s): "HGBA1C" in the last 72 hours. Lipid Profile: No results for input(s): "CHOL", "HDL", "LDLCALC", "TRIG", "CHOLHDL", "LDLDIRECT" in the last 72 hours. Thyroid function studies: No results for input(s): "TSH", "T4TOTAL", "T3FREE", "THYROIDAB" in the last 72 hours.  Invalid input(s): "FREET3"  Anemia work up: No results for input(s): "VITAMINB12", "FOLATE", "FERRITIN", "TIBC", "IRON", "RETICCTPCT" in the last 72 hours. Sepsis Labs: Recent Labs  Lab 12/16/22 0221 12/18/22 0436 12/21/22 0819  WBC 14.7* 10.0 9.6    Microbiology No results found for this or any previous visit (from the past 240 hour(s)).    Medications:    Chlorhexidine Gluconate Cloth  6 each Topical Daily   clonazepam  0.5 mg Oral BID   enoxaparin (LOVENOX) injection  40 mg Subcutaneous Q24H   famotidine  20 mg Per Tube QHS   feeding supplement (PROSource  TF20)  60 mL Per Tube BID   fiber supplement (BANATROL TF)  60 mL Per Tube TID   free water  200 mL Per Tube Q6H   gabapentin  100 mg Per Tube Q8H   guaiFENesin  400 mg Per Tube Q8H   leptospermum manuka honey  1 Application Topical QHS   levETIRAcetam  1,500 mg Per Tube BID   metoprolol tartrate  100 mg Per Tube BID   midodrine  5 mg Per Tube Q8H   nutrition supplement (JUVEN)  1 packet Per Tube BID BM   mouth rinse  15 mL Mouth Rinse 4 times per day   scopolamine  1 patch Transdermal Q72H   tobramycin (PF)  300 mg Nebulization BID   valproic acid  500 mg Per Tube Q8H   Continuous Infusions:  sodium chloride 250 mL (12/13/22 0041)   feeding supplement (OSMOLITE 1.5 CAL) 1,000 mL (12/22/22 0342)      LOS: 80 days   Charlynne Cousins  [facility]  12/22/2022, 7:43 AM

## 2022-12-23 DIAGNOSIS — G931 Anoxic brain damage, not elsewhere classified: Secondary | ICD-10-CM | POA: Diagnosis not present

## 2022-12-23 DIAGNOSIS — J101 Influenza due to other identified influenza virus with other respiratory manifestations: Secondary | ICD-10-CM | POA: Diagnosis not present

## 2022-12-23 DIAGNOSIS — Z515 Encounter for palliative care: Secondary | ICD-10-CM | POA: Diagnosis not present

## 2022-12-23 DIAGNOSIS — J9601 Acute respiratory failure with hypoxia: Secondary | ICD-10-CM | POA: Diagnosis not present

## 2022-12-23 LAB — GLUCOSE, CAPILLARY
Glucose-Capillary: 111 mg/dL — ABNORMAL HIGH (ref 70–99)
Glucose-Capillary: 127 mg/dL — ABNORMAL HIGH (ref 70–99)
Glucose-Capillary: 133 mg/dL — ABNORMAL HIGH (ref 70–99)
Glucose-Capillary: 135 mg/dL — ABNORMAL HIGH (ref 70–99)
Glucose-Capillary: 147 mg/dL — ABNORMAL HIGH (ref 70–99)

## 2022-12-23 MED ORDER — METOPROLOL TARTRATE 50 MG PO TABS
200.0000 mg | ORAL_TABLET | Freq: Two times a day (BID) | ORAL | Status: DC
  Administered 2022-12-23 – 2023-01-22 (×61): 200 mg
  Filled 2022-12-23 (×29): qty 4
  Filled 2022-12-23: qty 8
  Filled 2022-12-23 (×31): qty 4

## 2022-12-23 NOTE — Progress Notes (Signed)
[facility] PROGRESS NOTE    Progress Note  Damon [name]  [id number] DOB: August 18, 1961 DOA: 10/03/2022 PCP: [facility]     Brief Narrative:   Damon [name] is an 62 y.o. male past medical history of hypertension, hyperlipidemia tobacco abuse, chronic back pain comes into the [place] [facility] ED on 10/03/2022 for influenza A protracted hospitalization  Significant Events: 12/18 Flu A positive 12/20 Admitted, bipap, 5 minute code MRSA PCR - neg Urine strep _POS 12/21 Intubated, paralyzed, ARDS protocol, on 7cc/kg 12/22 Weaned to 6 cc/kg with dyssynchrony, back on 7 cc/kg, driving pressures good, weaning vent 12/23 Attempt to wean sedation again with dyssynchrony and desaturation.  Some concern for cuff leak, tube exchange.  Cuff did not appear to be blown.  Ongoing signs of cuff leak, query leaking around cough, possible large trachea, diuresing 12/25 tolerating PSV, myoclonus> ceribell, Neuro consult, changed to propofol, diuresed  12/26 Changed to DNR. On Propofol, versed.  Tolerating PSV.  MRI brain with hypoxic/anoxic injury Blood culture neg 12/27 GPD's on EEG, pending transfer to [facility] for cEEG NEURO CONSULT 12/28 moved to [facility] for LTM EEG 12/31 family updated by neuro: no chance for meaningful recovery, they should make plans for comfort measures 1/1 cultures - resp normal flora 1/2 eeg still with epileptogenicity. Palli fam mtg -- family does not want to hear from medical teams unless there is "good news"  1/3  cEEG with burst suppression w highly epileptiform bursts.  1/4 cEEG with burst suppression  + highly epileptiform bursts still.  WBC slightly up to 22.5. Temp high 99 low 100  c-EEG stopped duie to lack of benefit Pallative care consult - wife upset about early dc from ER on 12/18 Chaplain consult: Wife is sufering from anticipatory grief + accepting god's way + curious/anger about events leading upto current state EEG: generalized epileptogenicity with  high potential for seizures as well as profound diffuse encephalopathy. In the setting of cardiac arrest, this EEG pattern is suggestive of anoxic/hypoxic brain injury.  1/5 - Low grade fever +. [place] 22.5K.  on Zosyn. On vent fio2 30%. TF +. Per RN - diprivan stopped yesterday. No myoclonus today. Mild tachycardia + Rpt pall care on 10/21/22 per wife 1/6 - On vent FiO2 30%.  Tmax 99,51F ., WBC better. Still off diprivan -> No myoclonus. cXR visualized - devices in place and faiure clear Oral oxy and klonpin stopped 1/8 No significant changes in neuro status. Tachycardic to 130s, hypertensive to 140s. Coreg transitioned to metoprolol. 1/11 wife considering trach/peg, to discuss with surgery-discussed with Dr. Bobbye Morton 1/12 more tachycardic 1/16-spoke with wife at length at bedside, Dr. Hulen Skains also had a long conversation with spouse-desires to continue current aggressive care and desires PEG and trach placed 1/18 perc trach and peg 11/05/2022 transferred to floor due to need for ICU bed 11/08/2022 transition to [facility] with pulmonary following weekly for trach 2/4 recurrent fever, cultures collected. 2/5 changed to cuffless trach 2/18 antibiotics started for Pseudomonas on tracheal aspirate cultre 2/22 concern for myoclonic seizure, neuro reconsulted 2/26 transferred to ICU for secretions.  3/1-3/3: Respiratory status has improved.  Completed 10 days of Zosyn. Okay for transfer back to progressive floor.  Assessment/Plan:   Goals of care: IPAL from 11/07/2022, he has been DNR but transition back to full code. Remains full code persistent discussion with spouse by different providers.  Anoxic brain injury/myoclonic seizure activity: Neurology had a long discussion with the family on 10/14/2022 that she has sustained  irreversible anoxic brain injury that is catastrophic family was encouraged to move towards comfort measures. EEG on 11/16/2022 show myoclonic seizures. Neurology  reconsulted due to right lower extremity movement: Ativan was added to her regimen. Last seen by neurology on 12/08/2022, LTM discontinued by neurology. Patient is currently on Keppra, Depakote and clonazepam. Clonazepam was decreased on 12/18/2022. Patient has had no neurological improvement in the last 2 weeks  Acute hypoxic respiratory failure/ARDS due to influenza A and superimposed pneumococcal pneumonia: She completed course of Tamiflu, Rocephin then 7 days of Zosyn. 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  Pulmonary following on a weekly basis tracheostomy was exchanged on 12/13/2022. Has been experiencing significant secretions requiring suctioning. Patient was transferred back to the ICU due to to significant secretions, underwent chest physiotherapy. On 12/13/2022 was cleared by pulmonary to transfer out of the ICU. Repeated chest x-ray on 12/17/2022 showed no acute findings With no leukocytosis, with a Tmax of 98.7.  CBC 2 days ago was 9 which is improved from previous.  Pseudomonas pneumonia versus tracheitis: Culture on 12/01/2022 Pseudomonas resistant to imipenem. She completed 10-day course of Zosyn on 12/16/2022.  He also received a few days of levofloxacin. Patient is an tobramycin nebulizer treatment.  PEA arrest: Continue cardiac monitoring.  Recurrent fever/leukocytosis: Has had intermittent fevers throughout her hospital stay she has received multiple courses of antibiotics. She has remained afebrile, and his leukocytosis has resolved. He has completed courses of antibiotics and antifungal last day on 12/18/2022. He continues to be on inhaled tobramycin.  Paroxysmal sympathetic hyperactivity/sinus tachycardia: Continue metoprolol 200 mg twice a day plus diltiazem low-dose. His heart rate has been less than 110. Continue Neurontin.  TSH unremarkable. Echocardiogram shows a low EF without significant valvular abnormalities.  Constipation: Laxatives were held due to  frequent bowel movements.  Hypotension: Continue midodrine blood pressure stable.  Severe protein caloric malnutrition: Continue tube feedings, PEG tube placed on 11/01/2022.  Prediabetes mellitus: A1c of 5.9 not requiring sliding scale for several days continue CBGs check.  Hypovolemic hyponatremia: Resolved with increased free water.  Diarrhea: Has resolved rectal tube in place will discontinue.  Sacral decubitus ulcer stage II RN Pressure Injury Documentation: Pressure Injury 10/12/22 Sacrum Mid;Upper Stage 2 -  Partial thickness loss of dermis presenting as a shallow open injury with a red, pink wound bed without slough. (Active)  10/12/22 0200  Location: Sacrum  Location Orientation: Mid;Upper  Staging: Stage 2 -  Partial thickness loss of dermis presenting as a shallow open injury with a red, pink wound bed without slough.  Wound Description (Comments):   Present on Admission: Yes  Dressing Type Foam - Lift dressing to assess site every shift 12/22/22 2000    DVT prophylaxis: lovenox Family Communication:  Wife,  the previous physician spoke to the wife. She mentioned that she thinks patient is improving because he now has his eyes open. She mentions that all providers have been lying to her about his chances of recovery. She wanted to know the names of all of his medications which were provided to her. She asked about PT for the patient. She wants PT to work him. She continues to believe that patient will recover. She mentioned that she likes to meet with providers in person so that she can "see when they are lying". She didn't have any further questions.  Status is: Inpatient Remains inpatient appropriate because: Devastating anoxic brain injury.    Code Status:     Code Status Orders  (From admission,  onward)           Start     Ordered   11/07/22 1116  Full code  Continuous       Question:  By:  Answer:  Consent: discussion documented in EHR   11/07/22 1115            Code Status History     Date Active Date Inactive Code Status Order ID Comments User Context   10/09/2022 1851 11/07/2022 1115 DNR [id number]  Garner Nash, DO Inpatient   10/03/2022 0808 10/09/2022 1850 Full Code [id number]  Jonnie Finner, DO ED         IV Access:   Peripheral IV   Procedures and diagnostic studies:   No results found.   Medical Consultants:   None.   Subjective:    Saed Kingsley nonverbal  Objective:    Vitals:   12/23/22 0307 12/23/22 0415 12/23/22 0500 12/23/22 0526  BP: 108/86     Pulse: (!) 110 (!) 112    Resp: 20 20    Temp: 98.5 F (36.9 C)     TempSrc:      SpO2:  96%  93%  Weight:   61 kg   Height:       SpO2: 93 % O2 Flow Rate (L/min): 6 L/min FiO2 (%): 21 %   Intake/Output Summary (Last 24 hours) at 12/23/2022 0711 Last data filed at 12/23/2022 0526 Gross per 24 hour  Intake 2399.5 ml  Output 3375 ml  Net -975.5 ml    Filed Weights   12/21/22 0756 12/22/22 0708 12/23/22 0500  Weight: 63 kg 66 kg 61 kg    Exam: General exam: In no acute distress. Respiratory system: Good air movement and clear to auscultation. Cardiovascular system: S1 & S2 heard, RRR. No JVD. Gastrointestinal system: Abdomen is nondistended, soft and nontender.  Extremities: No pedal edema. Skin: No rashes, lesions or ulcers Data Reviewed:    Labs: Basic Metabolic Panel: Recent Labs  Lab 12/18/22 0436 12/21/22 0819  NA 138 138  K 4.3 4.4  CL 106 103  CO2 25 24  GLUCOSE 140* 96  BUN 49* 42*  CREATININE 0.66 0.57*  CALCIUM 9.2 9.5  MG 2.3  --   PHOS 4.5  --     GFR Estimated Creatinine Clearance: 82.2 mL/min (A) (by C-G formula based on SCr of 0.57 mg/dL (L)). Liver Function Tests: No results for input(s): "AST", "ALT", "ALKPHOS", "BILITOT", "PROT", "ALBUMIN" in the last 168 hours.  No results for input(s): "LIPASE", "AMYLASE" in the last 168 hours. No results for input(s): "AMMONIA" in the last 168  hours. Coagulation profile No results for input(s): "INR", "PROTIME" in the last 168 hours. COVID-19 Labs  No results for input(s): "DDIMER", "FERRITIN", "LDH", "CRP" in the last 72 hours.  Lab Results  Component Value Date   [place] NEGATIVE 10/01/2022    CBC: Recent Labs  Lab 12/18/22 0436 12/21/22 0819  WBC 10.0 9.6  NEUTROABS  --  5.4  HGB 11.3* 11.4*  HCT 35.2* 36.7*  MCV 89.6 90.8  PLT 291 314    Cardiac Enzymes: No results for input(s): "CKTOTAL", "CKMB", "CKMBINDEX", "TROPONINI" in the last 168 hours. BNP (last 3 results) No results for input(s): "PROBNP" in the last 8760 hours. CBG: Recent Labs  Lab 12/21/22 0826 12/21/22 1555 12/22/22 0745 12/22/22 1536 12/23/22 0036  GLUCAP 108* 126* 118* 109* 127*    D-Dimer: No results for input(s): "DDIMER" in the last 72 hours.  Hgb A1c: No results for input(s): "HGBA1C" in the last 72 hours. Lipid Profile: No results for input(s): "CHOL", "HDL", "LDLCALC", "TRIG", "CHOLHDL", "LDLDIRECT" in the last 72 hours. Thyroid function studies: No results for input(s): "TSH", "T4TOTAL", "T3FREE", "THYROIDAB" in the last 72 hours.  Invalid input(s): "FREET3"  Anemia work up: No results for input(s): "VITAMINB12", "FOLATE", "FERRITIN", "TIBC", "IRON", "RETICCTPCT" in the last 72 hours. Sepsis Labs: Recent Labs  Lab 12/18/22 0436 12/21/22 0819  WBC 10.0 9.6    Microbiology No results found for this or any previous visit (from the past 240 hour(s)).    Medications:    Chlorhexidine Gluconate Cloth  6 each Topical Daily   clonazepam  0.5 mg Oral BID   diltiazem  30 mg Oral Q6H   enoxaparin (LOVENOX) injection  40 mg Subcutaneous Q24H   famotidine  20 mg Per Tube QHS   feeding supplement (PROSource TF20)  60 mL Per Tube BID   fiber supplement (BANATROL TF)  60 mL Per Tube TID   free water  200 mL Per Tube Q6H   gabapentin  100 mg Per Tube Q8H   guaiFENesin  400 mg Per Tube Q8H   leptospermum manuka honey   1 Application Topical QHS   levETIRAcetam  1,500 mg Per Tube BID   metoprolol tartrate  100 mg Per Tube BID   midodrine  5 mg Per Tube Q8H   nutrition supplement (JUVEN)  1 packet Per Tube BID BM   mouth rinse  15 mL Mouth Rinse 4 times per day   scopolamine  1 patch Transdermal Q72H   tobramycin (PF)  300 mg Nebulization BID   valproic acid  500 mg Per Tube Q8H   Continuous Infusions:  sodium chloride 250 mL (12/13/22 0041)   feeding supplement (OSMOLITE 1.5 CAL) 70 mL/hr at 12/22/22 1937      LOS: 92 days   Charlynne Cousins  [facility]  12/23/2022, 7:11 AM

## 2022-12-23 NOTE — Progress Notes (Signed)
Daily Progress Note   Patient Name: Damon [name]       Date: 12/23/2022 DOB: 08/08/1961  Age: 63 y.o. MRN#: [id number] Attending Physician: Charlynne Cousins, MD Primary Care Physician: [facility] Admit Date: 10/03/2022  Reason for Consultation/Follow-up: Establishing goals of care  Patient Profile/HPI:  62 y/o male admitted to [facility] on 12/20 in setting of acute hypoxemic respiratory failure due to Flu A. Placed on BIPAP. Had a 5 minute PEA arrest, required intubation. After several days on mechanical ventilation remains comatose, MRI brain findings worrisome for anoxic brain injury. Moved to [facility] for LTM EEG monitoring.  Palliative care has been asked to get involved for further goals of care conversations in the setting of anoxic brain injury post arrest. 12/31- no improvements, prognosis has been discussed extensively with family by palliative team, neurology and PCCM providers. 1/2- f/u meeting with patient's spouse [name]- she requested time and space as she grapples with processing eventual loss of spouse and how her life will be without him, she is not yet in a place to consider transition to comfort 1/7- propofol dc'd 1/5, no further myoclonus, no other sedating meds, discussed with spouse that decisions are needing to be made re: trach/PEG/LTC vs comfort 1/8 No significant changes in neuro status. Tachycardic to 130s, hypertensive to 140s. Coreg transitioned to metoprolol.   1/11-having some recurrent myoclonus ?neurostorming 1/15-wife requesting trach/PEG 1/23- trach/PEG in place, tolerating trach collar 2/20- no neurologic changes, TOC working on placement 3/10- treated for pseudomonas that grew on tracheal aspirate, stable   Subjective: Chart reviewed including labs,  progress notes, imaging from this and previous encounters.  Evaluated patient. Eyes are open but he doesn't track. Doesn't follow commands. Significant secretions, cough is intact but weak. PCCM is managing secretions.  No family at bedside.   Review of Systems  Unable to perform ROS: Mental acuity   Physical Exam Vitals and nursing note reviewed.  Constitutional:      Comments: Frail, cachectic  Cardiovascular:     Rate and Rhythm: Normal rate.  Pulmonary:     Comments: secretions Neurological:     Comments: Responds to pain, opens eyes to voice, nonverbal, no tracking, does not follow commands             Vital Signs: BP 101/76 (BP Location: Right Arm)   Pulse (!) 102   Temp 98.6 F (37 C) (Oral)   Resp 18   Ht '5\' 4"'$  (1.626 m)   Wt 61 kg   SpO2 98%   BMI 23.08 kg/m  SpO2: SpO2: 98 % O2 Device: O2 Device: Tracheostomy Collar O2 Flow Rate: O2 Flow Rate (L/min): 6 L/min  Intake/output summary:  Intake/Output Summary (Last 24 hours) at 12/23/2022 1312 Last data filed at 12/23/2022 1150 Gross per 24 hour  Intake 3196.33 ml  Output 3275 ml  Net -78.67 ml    LBM: Last BM Date : 12/22/22 Baseline Weight: Weight: 64.9 kg Most recent weight: Weight: 61 kg       Palliative Assessment/Data: PPS: 10%      Patient Active Problem List   Diagnosis Date Noted   Malnutrition of moderate degree 12/14/2022   Pneumonia of right lower lobe  due to Pseudomonas species ([place]) 12/12/2022   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   Tracheostomy status ([place]) 11/12/2022   Advanced care planning/counseling discussion 10/21/2022   On mechanically assisted ventilation ([place]) 10/17/2022   Anoxic brain damage ([place]) 10/17/2022   Anoxic encephalopathy ([place]) 10/16/2022   Seizure ([place]) 10/16/2022   Pressure injury of skin 10/16/2022   Acute hypoxic respiratory failure ([place]) 10/03/2022   CAP (community acquired pneumonia) 10/03/2022   Influenza and pneumonia 10/03/2022   Elevated LFTs  10/03/2022   HTN (hypertension) 10/03/2022   HLD (hyperlipidemia) 10/03/2022   Tobacco abuse 10/03/2022   SIRS (systemic inflammatory response syndrome) ([place]) 10/03/2022   Influenza A 10/03/2022   Cardiac arrest ([place]) 10/03/2022   Tobacco dependence 10/03/2022    Palliative Care Assessment & Plan    Assessment/Recommendations/Plan  Full scope/Full code No family at bedside- Attempted to call spouse- no answer Palliative initially consulted in January- family very invested in full scope/full code, spouse, [name], historically has expressed anger regarding patient's situation and care and has been insistent on full scope, full code. She declined further goals of care discussions. Unfortunately, I don't believe that further Palliative involvement at this time will benefit patient or spouse.  If patient's status significantly worsens, or spouse requests Palliative- please feel free to reach out and we will be happy to re-engage  Code Status: Full code  Prognosis:  Unable to determine  Discharge Planning: To Be Determined  Thank you for allowing the Palliative Medicine Team to assist in the care of this patient.  Total time: 50 minutes Greater than 50%  of this time was spent counseling and coordinating care related to the above assessment and plan.  [name], AGNP-C Palliative Medicine   Please contact Palliative Medicine Team phone at [phone] for questions and concerns.

## 2022-12-24 ENCOUNTER — Inpatient Hospital Stay (HOSPITAL_COMMUNITY): Payer: Medicare Other

## 2022-12-24 DIAGNOSIS — J9601 Acute respiratory failure with hypoxia: Secondary | ICD-10-CM | POA: Diagnosis not present

## 2022-12-24 LAB — BASIC METABOLIC PANEL
Anion gap: 12 (ref 5–15)
BUN: 31 mg/dL — ABNORMAL HIGH (ref 6–20)
CO2: 24 mmol/L (ref 22–32)
Calcium: 9.5 mg/dL (ref 8.9–10.3)
Chloride: 99 mmol/L (ref 98–111)
Creatinine, Ser: 0.57 mg/dL — ABNORMAL LOW (ref 0.61–1.24)
GFR, Estimated: >60 mL/min (ref >60)
Glucose, Bld: 126 mg/dL — ABNORMAL HIGH (ref 70–99)
Potassium: 4.4 mmol/L (ref 3.5–5.1)
Sodium: 135 mmol/L (ref 135–145)

## 2022-12-24 LAB — GLUCOSE, CAPILLARY
Glucose-Capillary: 115 mg/dL — ABNORMAL HIGH (ref 70–99)
Glucose-Capillary: 125 mg/dL — ABNORMAL HIGH (ref 70–99)
Glucose-Capillary: 163 mg/dL — ABNORMAL HIGH (ref 70–99)

## 2022-12-24 MED ORDER — FREE WATER
200.0000 mL | Freq: Three times a day (TID) | Status: DC
  Administered 2022-12-25 – 2023-01-22 (×86): 200 mL

## 2022-12-24 NOTE — Progress Notes (Signed)
[facility] PROGRESS NOTE    Progress Note  Damon [name]  [id number] DOB: August 18, 1961 DOA: 10/03/2022 PCP: [facility]     Brief Narrative:   Damon [name] is an 62 y.o. male past medical history of hypertension, hyperlipidemia tobacco abuse, chronic back pain comes into the [facility] ED on 10/03/2022 for influenza A protracted hospitalization  Significant Events: 12/18 Flu A positive 12/20 Admitted, bipap, 5 minute code MRSA PCR - neg Urine strep _POS 12/21 Intubated, paralyzed, ARDS protocol, on 7cc/kg 12/22 Weaned to 6 cc/kg with dyssynchrony, back on 7 cc/kg, driving pressures good, weaning vent 12/23 Attempt to wean sedation again with dyssynchrony and desaturation.  Some concern for cuff leak, tube exchange.  Cuff did not appear to be blown.  Ongoing signs of cuff leak, query leaking around cough, possible large trachea, diuresing 12/25 tolerating PSV, myoclonus> ceribell, Neuro consult, changed to propofol, diuresed  12/26 Changed to DNR. On Propofol, versed.  Tolerating PSV.  MRI brain with hypoxic/anoxic injury Blood culture neg 12/27 GPD's on EEG, pending transfer to [facility] for cEEG NEURO CONSULT 12/28 moved to [facility] for LTM EEG 12/31 family updated by neuro: no chance for meaningful recovery, they should make plans for comfort measures 1/1 cultures - resp normal flora 1/2 eeg still with epileptogenicity. Palli fam mtg -- family does not want to hear from medical teams unless there is "good news"  1/3  cEEG with burst suppression w highly epileptiform bursts.  1/4 cEEG with burst suppression  + highly epileptiform bursts still.  WBC slightly up to 22.5. Temp high 99 low 100  c-EEG stopped duie to lack of benefit Pallative care consult - wife upset about early dc from ER on 12/18 Chaplain consult: Wife is sufering from anticipatory grief + accepting god's way + curious/anger about events leading upto current state EEG: generalized epileptogenicity with  high potential for seizures as well as profound diffuse encephalopathy. In the setting of cardiac arrest, this EEG pattern is suggestive of anoxic/hypoxic brain injury.  1/5 - Low grade fever +. [place] 22.5K.  on Zosyn. On vent fio2 30%. TF +. Per RN - diprivan stopped yesterday. No myoclonus today. Mild tachycardia + Rpt pall care on 10/21/22 per wife 1/6 - On vent FiO2 30%.  Tmax 99,51F ., WBC better. Still off diprivan -> No myoclonus. cXR visualized - devices in place and faiure clear Oral oxy and klonpin stopped 1/8 No significant changes in neuro status. Tachycardic to 130s, hypertensive to 140s. Coreg transitioned to metoprolol. 1/11 wife considering trach/peg, to discuss with surgery-discussed with Dr. Bobbye Morton 1/12 more tachycardic 1/16-spoke with wife at length at bedside, Dr. Hulen Skains also had a long conversation with spouse-desires to continue current aggressive care and desires PEG and trach placed 1/18 perc trach and peg 11/05/2022 transferred to floor due to need for ICU bed 11/08/2022 transition to [facility] with pulmonary following weekly for trach 2/4 recurrent fever, cultures collected. 2/5 changed to cuffless trach 2/18 antibiotics started for Pseudomonas on tracheal aspirate cultre 2/22 concern for myoclonic seizure, neuro reconsulted 2/26 transferred to ICU for secretions.  3/1-3/3: Respiratory status has improved.  Completed 10 days of Zosyn. Okay for transfer back to progressive floor.  Assessment/Plan:   Goals of care: IPAL from 11/07/2022, he has been DNR but transition back to full code. Remains full code persistent discussion with spouse by different providers.  Anoxic brain injury/myoclonic seizure activity: Neurology had a long discussion with the family on 10/14/2022 that she has sustained  irreversible anoxic brain injury that is catastrophic family was encouraged to move towards comfort measures. EEG on 11/16/2022 show myoclonic seizures. Neurology  reconsulted due to right lower extremity movement: Ativan was added to her regimen. Last seen by neurology on 12/08/2022, LTM discontinued by neurology. Patient is currently on Keppra, Depakote and clonazepam. Clonazepam was decreased on 12/18/2022. Patient has had no neurological improvement in the last 2 weeks. Talk to the wife over the weekend and today she has unrealistic expectations.  Acute hypoxic respiratory failure/ARDS due to influenza A and superimposed pneumococcal pneumonia: He completed course of Tamiflu, Rocephin then 7 days of Zosyn. 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  Pulmonary following on a weekly basis tracheostomy was exchanged on 12/13/2022. Has been experiencing significant secretions requiring suctioning. Patient was transferred back to the ICU due to to significant secretions, underwent chest physiotherapy. On 12/13/2022 was cleared by pulmonary to transfer out of the ICU. Repeated chest x-ray on 12/17/2022 showed no acute findings With no leukocytosis, with a Tmax of 98.7.  CBC 2 days ago was 9 which is improved from previous.  Pseudomonas pneumonia versus tracheitis: Culture on 12/01/2022 Pseudomonas resistant to imipenem. She completed 10-day course of Zosyn on 12/16/2022.  He also received a few days of levofloxacin. Patient is an tobramycin nebulizer treatment.  PEA arrest: Continue cardiac monitoring.  Recurrent fever/leukocytosis: Has had intermittent fevers throughout her hospital stay she has received multiple courses of antibiotics. She has remained afebrile, and his leukocytosis has resolved. He has completed courses of antibiotics and antifungal last day on 12/18/2022. He continues to be on inhaled tobramycin. Has had significant increase secretion check chest x-ray  Paroxysmal sympathetic hyperactivity/sinus tachycardia: Continue metoprolol 200 mg twice a day plus diltiazem low-dose. Heart rate continues to be greater than 100. Check chest  x-ray. Continue Neurontin.  TSH unremarkable. Echocardiogram shows a low EF without significant valvular abnormalities.  Constipation: Laxatives were held due to frequent bowel movements.  Hypotension: Continue midodrine blood pressure stable.  Severe protein caloric malnutrition: Continue tube feedings, PEG tube placed on 11/01/2022.  Prediabetes mellitus: A1c of 5.9 not requiring sliding scale for several days continue CBGs check.  Hypovolemic hyponatremia: Resolved with increased free water.  Diarrhea: Has resolved rectal tube in place will discontinue.  Sacral decubitus ulcer stage II RN Pressure Injury Documentation: Pressure Injury 10/12/22 Sacrum Mid;Upper Stage 2 -  Partial thickness loss of dermis presenting as a shallow open injury with a red, pink wound bed without slough. (Active)  10/12/22 0200  Location: Sacrum  Location Orientation: Mid;Upper  Staging: Stage 2 -  Partial thickness loss of dermis presenting as a shallow open injury with a red, pink wound bed without slough.  Wound Description (Comments):   Present on Admission: Yes  Dressing Type Foam - Lift dressing to assess site every shift 12/24/22 0443    DVT prophylaxis: lovenox Family Communication:  Wife,  the previous physician spoke to the wife. She mentioned that she thinks patient is improving because he now has his eyes open. She mentions that all providers have been lying to her about his chances of recovery. She wanted to know the names of all of his medications which were provided to her. She asked about PT for the patient. She wants PT to work him. She continues to believe that patient will recover. She mentioned that she likes to meet with providers in person so that she can "see when they are lying". She didn't have any further questions.  Status is:  Inpatient Remains inpatient appropriate because: Devastating anoxic brain injury.    Code Status:     Code Status Orders  (From admission,  onward)           Start     Ordered   11/07/22 1116  Full code  Continuous       Question:  By:  Answer:  Consent: discussion documented in EHR   11/07/22 1115           Code Status History     Date Active Date Inactive Code Status Order ID Comments User Context   10/09/2022 1851 11/07/2022 1115 DNR [id number]  Garner Nash, DO Inpatient   10/03/2022 0808 10/09/2022 1850 Full Code [id number]  Jonnie Finner, DO ED         IV Access:   Peripheral IV   Procedures and diagnostic studies:   No results found.   Medical Consultants:   None.   Subjective:    Damon [name] nonverbal  Objective:    Vitals:   12/23/22 2324 12/23/22 2330 12/24/22 0320 12/24/22 0743  BP: 115/76  120/88   Pulse: (!) 103 (!) 101 (!) 111   Resp: (!) 21 (!) 21 20   Temp: 98.6 F (37 C)  99 F (37.2 C) 98.9 F (37.2 C)  TempSrc: Axillary  Axillary Axillary  SpO2: 95% 96% 93%   Weight:      Height:       SpO2: 93 % O2 Flow Rate (L/min): 6 L/min FiO2 (%): 21 %   Intake/Output Summary (Last 24 hours) at 12/24/2022 0751 Last data filed at 12/24/2022 0500 Gross per 24 hour  Intake 2326 ml  Output 1600 ml  Net 726 ml    Filed Weights   12/21/22 0756 12/22/22 0708 12/23/22 0500  Weight: 63 kg 66 kg 61 kg    Exam: General exam: In no acute distress. Respiratory system: Good air movement and clear to auscultation. Cardiovascular system: S1 & S2 heard, RRR. No JVD. Gastrointestinal system: Abdomen is nondistended, soft and nontender.  Extremities: No pedal edema. Skin: No rashes, lesions or ulcers Data Reviewed:    Labs: Basic Metabolic Panel: Recent Labs  Lab 12/18/22 0436 12/21/22 0819  NA 138 138  K 4.3 4.4  CL 106 103  CO2 25 24  GLUCOSE 140* 96  BUN 49* 42*  CREATININE 0.66 0.57*  CALCIUM 9.2 9.5  MG 2.3  --   PHOS 4.5  --     GFR Estimated Creatinine Clearance: 82.2 mL/min (A) (by C-G formula based on SCr of 0.57 mg/dL (L)). Liver Function  Tests: No results for input(s): "AST", "ALT", "ALKPHOS", "BILITOT", "PROT", "ALBUMIN" in the last 168 hours.  No results for input(s): "LIPASE", "AMYLASE" in the last 168 hours. No results for input(s): "AMMONIA" in the last 168 hours. Coagulation profile No results for input(s): "INR", "PROTIME" in the last 168 hours. COVID-19 Labs  No results for input(s): "DDIMER", "FERRITIN", "LDH", "CRP" in the last 72 hours.  Lab Results  Component Value Date   [place] NEGATIVE 10/01/2022    CBC: Recent Labs  Lab 12/18/22 0436 12/21/22 0819  WBC 10.0 9.6  NEUTROABS  --  5.4  HGB 11.3* 11.4*  HCT 35.2* 36.7*  MCV 89.6 90.8  PLT 291 314    Cardiac Enzymes: No results for input(s): "CKTOTAL", "CKMB", "CKMBINDEX", "TROPONINI" in the last 168 hours. BNP (last 3 results) No results for input(s): "PROBNP" in the last 8760 hours. CBG: Recent Labs  Lab 12/23/22 0835 12/23/22 1222 12/23/22 1629 12/23/22 2323 12/24/22 0741  GLUCAP 147* 111* 133* 135* 125*    D-Dimer: No results for input(s): "DDIMER" in the last 72 hours. Hgb A1c: No results for input(s): "HGBA1C" in the last 72 hours. Lipid Profile: No results for input(s): "CHOL", "HDL", "LDLCALC", "TRIG", "CHOLHDL", "LDLDIRECT" in the last 72 hours. Thyroid function studies: No results for input(s): "TSH", "T4TOTAL", "T3FREE", "THYROIDAB" in the last 72 hours.  Invalid input(s): "FREET3"  Anemia work up: No results for input(s): "VITAMINB12", "FOLATE", "FERRITIN", "TIBC", "IRON", "RETICCTPCT" in the last 72 hours. Sepsis Labs: Recent Labs  Lab 12/18/22 0436 12/21/22 0819  WBC 10.0 9.6    Microbiology No results found for this or any previous visit (from the past 240 hour(s)).    Medications:    Chlorhexidine Gluconate Cloth  6 each Topical Daily   clonazepam  0.5 mg Oral BID   diltiazem  30 mg Oral Q6H   enoxaparin (LOVENOX) injection  40 mg Subcutaneous Q24H   famotidine  20 mg Per Tube QHS   feeding  supplement (PROSource TF20)  60 mL Per Tube BID   fiber supplement (BANATROL TF)  60 mL Per Tube TID   free water  200 mL Per Tube Q6H   gabapentin  100 mg Per Tube Q8H   guaiFENesin  400 mg Per Tube Q8H   leptospermum manuka honey  1 Application Topical QHS   levETIRAcetam  1,500 mg Per Tube BID   metoprolol tartrate  200 mg Per Tube BID   midodrine  5 mg Per Tube Q8H   nutrition supplement (JUVEN)  1 packet Per Tube BID BM   mouth rinse  15 mL Mouth Rinse 4 times per day   scopolamine  1 patch Transdermal Q72H   tobramycin (PF)  300 mg Nebulization BID   valproic acid  500 mg Per Tube Q8H   Continuous Infusions:  sodium chloride 250 mL (12/13/22 0041)   feeding supplement (OSMOLITE 1.5 CAL) 1,000 mL (12/24/22 0449)      LOS: 82 days   Charlynne Cousins  [facility]  12/24/2022, 7:51 AM

## 2022-12-24 NOTE — Progress Notes (Signed)
NAME:  Damon [name], MRN:  [id number], DOB:  Jan 02, 1961, LOS: 82 ADMISSION DATE:  10/03/2022, CONSULTATION DATE:  10/03/2022 REFERRING MD:  Marylyn Ishihara - [facility] CHIEF COMPLAINT:  Dyspnea   History of Present Illness:  62 year old man admitted to [facility] 12/20 in the setting of acute hypoxemic respiratory failure due to Flu A. PMHx significant for HTN, HLD, chronic back pain, tobacco abuse   Placed on BIPAP.  Coded (likely in the setting of hypoxia), initial rhythm PEA with CPR x 5 minutes, required intubation.  After several days on mechanical ventilation remains comatose, MRI Brain 12/26 worrisome for anoxic brain injury.  Transferred to [facility] for LTM EEG monitoring.  Neurology consulted. LTM with generalized epileptogenicity. Treated with Propofol, Depakote, Keppra, Ketamine. 12/31 Neurology with goals of care with family. Relayed that patient has a grim neurological recovery due to irreversible anoxic injury. Family wished to continue aggressive measures.   Palliative care consulted. Stay complicated with neuro-storming.   Surgical consult regarding PEG/trach.  Pertinent Medical History:   Past Medical History:  Diagnosis Date   Elevated lipids    Hypertension    Tobacco abuse    Significant Hospital Events: Including procedures, antibiotic start and stop dates in addition to other pertinent events   12/18 Flu A positive 12/20 Admitted, 5 minute code after non-compliance with BiPAP. MRSA PCR - neg, Urine strep > POS 12/21 Intubated, paralyzed, ARDS protocol, on 7cc/kg 12/22 Weaned to 6 cc/kg with dyssynchrony, back on 7 cc/kg, driving pressures good, weaning vent 12/23 Attempt to wean sedation again with dyssynchrony and desaturation.  Some concern for cuff leak, tube exchange.  Cuff did not appear to be blown.  Ongoing signs of cuff leak, query leaking around cough, possible large trachea, diuresing 12/25 tolerating PSV, myoclonus> ceribell, Neuro consult, changed to propofol, diuresed  12/26  Changed to DNR. On Propofol, versed.  Tolerating PSV.  MRI brain with hypoxic/anoxic injury. Blood culture neg 12/27 GPD's on EEG, pending transfer to [facility] for cEEG. NEURO CONSULT 12/28 moved to [facility] for LTM EEG 12/31 family updated by neuro: no chance for meaningful recovery, they should make plans for comfort measures 1/1 cultures - resp normal flora 1/2 eeg still with epileptogenicity. Palli fam mtg -- family does not want to hear from medical teams unless there is "good news"  1/3  cEEG with burst suppression w highly epileptiform bursts.  1/4 cEEG with burst suppression  + highly epileptiform bursts still.  WBC slightly up to 22.5. Temp high 99 low 100  c-EEG stopped due to lack of benefit Pallative care consult - wife upset about early dc from ER on 12/18 Chaplain consult: Wife is sufering from anticipatory grief + accepting god's way + curious/anger about events leading upto current state EEG: generalized epileptogenicity with high potential for seizures as well as profound diffuse encephalopathy. In the setting of cardiac arrest, this EEG pattern is suggestive of anoxic/hypoxic brain injury.  1/5 Low grade fever +. [place] 22.5K.  on Zosyn. On vent fio2 30%. TF +. Per RN - diprivan stopped yesterday. No myoclonus today. Mild tachycardia + Rpt pall care on 10/21/22 per wife 1/6 On vent FiO2 30%.  Tmax 99,63F ., WBC better. Still off diprivan -> No myoclonus. cXR visualized - devices in place and faiure clear. Oral oxy and klonpin stopped 1/8 No significant changes in neuro status. Tachycardic to 130s, hypertensive to 140s. Coreg transitioned to metoprolol. 1/11 wife considering trach/peg, to discuss with surgery-discussed with Dr. Bobbye Morton 1/12 more tachycardic 1/16-spoke with  wife at length at bedside, Dr. Hulen Skains also had a long conversation with spouse-desires to continue current aggressive care and desires PEG and trach placed 1/18 perc trach and peg 11/05/2022 transferred to floor due to need for  ICU bed 11/08/2022 transition to [facility] with pulmonary following weekly for trach 2/5 ATC 28%, no acute issues with trach  2/26 Copious tracheal sections in the setting of pseudomonas pneumonia resulting in need to move to ICU for close monitoring/ RT care. 2/29 >> Trach change 3/3 completed 10 days of Zosyn 12/17/2022>> Waiting on Progressive bed.  Tobramycin nebs started to treat Pseudomonas in the sputum  Interim History / Subjective:   No acute events.  Remains on trach collar  Objective:  Blood pressure 120/88, pulse (!) 128, temperature 98.9 F (37.2 C), temperature source Axillary, resp. rate (!) 25, height '5\' 4"'$  (1.626 m), weight 61 kg, SpO2 95 %.    FiO2 (%):  [21 %] 21 %   Intake/Output Summary (Last 24 hours) at 12/24/2022 0930 Last data filed at 12/24/2022 0500 Gross per 24 hour  Intake 2326 ml  Output 1600 ml  Net 726 ml  BP 120/88 (BP Location: Right Arm)   Pulse (!) 128   Temp 98.9 F (37.2 C) (Axillary)   Resp (!) 25   Ht '5\' 4"'$  (1.626 m)   Wt 61 kg   SpO2 95%   BMI 23.08 kg/m   Filed Weights   12/21/22 0756 12/22/22 0708 12/23/22 0500  Weight: 63 kg 66 kg 61 kg   Physical Exam: Gen:      No acute distress, chronically ill-appearing HEENT:  EOMI, sclera anicteric Neck:     No masses; no thyromegaly, tracheostomy Lungs:    Clear to auscultation bilaterally; normal respiratory effort CV:         Regular rate and rhythm; no murmurs Abd:      + bowel sounds; soft, non-tender; no palpable masses, no distension Ext:    No edema; adequate peripheral perfusion Skin:      Warm and dry; no rash Neuro: Awake, tracks with eyes.  Nonverbal  Labs/Imaging reviewed Significant for BUN/creatinine 31/0.57 Hemoglobin 11.4, INR 1.4 Chest x-ray stable with bibasal atelectasis  Resolved   Flu A AKI CAP, pneumococcal pneumonia - s/p Zosyn 12/31 - 1/7; Ceftriaxone 12/20- 12/27, Azithro 12/20, Flagyl 12/20 - 12/22  Assessment & Plan:   Anoxic brain  injury, ventilator dependent s/p perc trach 1/18 Acute respiratory failure with hypoxia/ARDS secondary to flu and strep (completed rx) Seizures Paroxysmal sympathetic hyperactivity Fever Leukocytosis  Anemia Hyperkalemia Tachycardia/hypotension due to paroxysmal sympathetic hyperactivity Severe protein calorie malnutrition Hyperglycemia Pseudomonas pneumonia RLL  Discussion Tracheostomy, suctioning frequency improving. With poor mental status, he is unlikely to be a candidate for decannulation any time soon.   Plan: Continue routine trach care, bronchial hygiene, chest PT Tobramycin nebs started on 3/4.  Can continue for 10 days Consider Lasix prn Overall prognosis is guarded with limited improvement in neurologic function.    PCCM will continue to follow intermittently for trach; will see next on Monday, March 18 unless he needs to be seen sooner.  Marshell Garfinkel MD [facility] See Amion for pager  If no response to pager , please call [phone] until 7pm After 7:00 pm call Elink  [phone] 12/24/2022, 10:32 AM

## 2022-12-25 DIAGNOSIS — J9601 Acute respiratory failure with hypoxia: Secondary | ICD-10-CM | POA: Diagnosis not present

## 2022-12-25 LAB — BASIC METABOLIC PANEL
Anion gap: 13 (ref 5–15)
BUN: 40 mg/dL — ABNORMAL HIGH (ref 6–20)
CO2: 22 mmol/L (ref 22–32)
Calcium: 9.9 mg/dL (ref 8.9–10.3)
Chloride: 102 mmol/L (ref 98–111)
Creatinine, Ser: 0.63 mg/dL (ref 0.61–1.24)
GFR, Estimated: >60 mL/min (ref >60)
Glucose, Bld: 103 mg/dL — ABNORMAL HIGH (ref 70–99)
Potassium: 4.4 mmol/L (ref 3.5–5.1)
Sodium: 137 mmol/L (ref 135–145)

## 2022-12-25 LAB — GLUCOSE, CAPILLARY: Glucose-Capillary: 155 mg/dL — ABNORMAL HIGH (ref 70–99)

## 2022-12-25 MED ORDER — DILTIAZEM HCL 60 MG PO TABS
60.0000 mg | ORAL_TABLET | Freq: Four times a day (QID) | ORAL | Status: DC
  Administered 2022-12-25 – 2022-12-28 (×10): 60 mg via ORAL
  Filled 2022-12-25 (×13): qty 1

## 2022-12-25 MED ORDER — METOPROLOL TARTRATE 5 MG/5ML IV SOLN
5.0000 mg | INTRAVENOUS | Status: DC | PRN
  Administered 2022-12-25 – 2023-01-08 (×4): 5 mg via INTRAVENOUS
  Filled 2022-12-25 (×4): qty 5

## 2022-12-25 MED ORDER — OSMOLITE 1.5 CAL PO LIQD
1000.0000 mL | ORAL | Status: DC
  Administered 2022-12-27 – 2022-12-31 (×6): 1000 mL
  Filled 2022-12-25 (×5): qty 1000

## 2022-12-25 MED ORDER — BANATROL TF EN LIQD
60.0000 mL | Freq: Two times a day (BID) | ENTERAL | Status: DC
  Administered 2022-12-25 – 2023-01-22 (×56): 60 mL
  Filled 2022-12-25 (×56): qty 60

## 2022-12-25 NOTE — Progress Notes (Signed)
Palliative-   Chart reviewed- no acute changes in condition. Palliative will sign off for now- if patient's condition changes, or patient's spouse expresses and interest in meeting with Palliative providers again please feel free to re-consult.   [name], AGNP-C Palliative Medicine  No charge

## 2022-12-25 NOTE — Progress Notes (Signed)
Nutrition Follow-up  DOCUMENTATION CODES:   Non-severe (moderate) malnutrition in context of acute illness/injury  INTERVENTION:  Continue tyube feeds via PEG:  - Osmolite 1.5 '@50'$  (1200 ml/day) which provides 1800 kcal (72%), 75 g protein (60%), 914 ml fluid (46%)  -PROSource TF20 60 ml BID - Free water flushes per MD, currently 200 ml q 8 hours  Continue Juven BID per tube, each packet provides 80 calories, 8 grams of carbohydrate, 2.5 grams of protein (collagen), 7 grams of L-arginine and 7 grams of L-glutamine; supplement contains CaHMB, Vitamins C, E, B12 and Zinc to promote wound healing   Reduce Banatrol TF to BID per tube, each packet provides 5 g of soluble fiber, to thicken stool consistency as pt still with rectal tube in place  - 200 ml free water  RD recommends advancing TF to previous goal rate (70 ml/hr) as tolerated which provides 2680 kcal (100%), 145 grams of protein (100%), and 2080 ml of H2O (100%)   NUTRITION DIAGNOSIS:   Moderate Malnutrition related to acute illness as evidenced by mild fat depletion, moderate fat depletion, severe fat depletion, severe muscle depletion, percent weight loss.   GOAL:   Patient will meet greater than or equal to 90% of their needs  -goal unmet at this time due to tube feeds not running at goal rate   MONITOR:   Labs, Weight trends, TF tolerance, Skin, I & O's  REASON FOR ASSESSMENT:   Consult Enteral/tube feeding initiation and management  ASSESSMENT:   Pt admitted from home with the flu leading to acute respiratory failure with hypoxia and PEA arrest upon admission. PMH significant for HTN, HLD, chronic back pain, tobacco use. Pt remains on trach collar   Patient resting in bed and his wife was at bedside.  His wife denies any nausea/vomiting. Patient no longer has rectal tube per observation during visit. Tube feeds running- Osmolite 1.5 at 50 ml/hr via PEG. Tube feed rate reduced by MD per patient's RN due to his  tracheal secretions reducing when tube feed rate is decreased?   Patient's weight is increasing (CBW: 61 kg), however, he is 3L fluid positive.  Palliative signed off this afternoon.  Sacral wound improving   Labs: Glu 103, BUN 40  Meds: reviewed and include pepcid, scopolamine  Diet Order:   Diet Order     None       EDUCATION NEEDS:   No education needs have been identified at this time  Skin:  Skin Assessment: Skin Integrity Issues: Skin Integrity Issues:: Unstageable Stage II: N/A Unstageable: sacrum (8cm x 1.5 cm x 0 cm)-WOC evaluated on 1/17  Last BM:  3/12 type 7  Height:   Ht Readings from Last 1 Encounters:  10/11/22 '5\' 4"'$  (1.626 m)    Weight:   Wt Readings from Last 1 Encounters:  12/24/22 61 kg     BMI:  Body mass index is 23.08 kg/m.  Estimated Nutritional Needs:   Kcal:  2500-2700 kcals  Protein:  125-150 g  Fluid:  >/= 2 L/day    Trey Paula, RDN, LDN  Clinical Nutrition

## 2022-12-25 NOTE — Progress Notes (Signed)
[facility] PROGRESS NOTE    Progress Note  Damon [name]  [id number] DOB: August 18, 1961 DOA: 10/03/2022 PCP: [facility]     Brief Narrative:   Damon [name] is an 62 y.o. male past medical history of hypertension, hyperlipidemia tobacco abuse, chronic back pain comes into the [place] [facility] ED on 10/03/2022 for influenza A protracted hospitalization  Significant Events: 12/18 Flu A positive 12/20 Admitted, bipap, 5 minute code MRSA PCR - neg Urine strep _POS 12/21 Intubated, paralyzed, ARDS protocol, on 7cc/kg 12/22 Weaned to 6 cc/kg with dyssynchrony, back on 7 cc/kg, driving pressures good, weaning vent 12/23 Attempt to wean sedation again with dyssynchrony and desaturation.  Some concern for cuff leak, tube exchange.  Cuff did not appear to be blown.  Ongoing signs of cuff leak, query leaking around cough, possible large trachea, diuresing 12/25 tolerating PSV, myoclonus> ceribell, Neuro consult, changed to propofol, diuresed  12/26 Changed to DNR. On Propofol, versed.  Tolerating PSV.  MRI brain with hypoxic/anoxic injury Blood culture neg 12/27 GPD's on EEG, pending transfer to [facility] for cEEG NEURO CONSULT 12/28 moved to [facility] for LTM EEG 12/31 family updated by neuro: no chance for meaningful recovery, they should make plans for comfort measures 1/1 cultures - resp normal flora 1/2 eeg still with epileptogenicity. Palli fam mtg -- family does not want to hear from medical teams unless there is "good news"  1/3  cEEG with burst suppression w highly epileptiform bursts.  1/4 cEEG with burst suppression  + highly epileptiform bursts still.  WBC slightly up to 22.5. Temp high 99 low 100  c-EEG stopped duie to lack of benefit Pallative care consult - wife upset about early dc from ER on 12/18 Chaplain consult: Wife is sufering from anticipatory grief + accepting god's way + curious/anger about events leading upto current state EEG: generalized epileptogenicity with  high potential for seizures as well as profound diffuse encephalopathy. In the setting of cardiac arrest, this EEG pattern is suggestive of anoxic/hypoxic brain injury.  1/5 - Low grade fever +. [place] 22.5K.  on Zosyn. On vent fio2 30%. TF +. Per RN - diprivan stopped yesterday. No myoclonus today. Mild tachycardia + Rpt pall care on 10/21/22 per wife 1/6 - On vent FiO2 30%.  Tmax 99,51F ., WBC better. Still off diprivan -> No myoclonus. cXR visualized - devices in place and faiure clear Oral oxy and klonpin stopped 1/8 No significant changes in neuro status. Tachycardic to 130s, hypertensive to 140s. Coreg transitioned to metoprolol. 1/11 wife considering trach/peg, to discuss with surgery-discussed with Dr. Bobbye Morton 1/12 more tachycardic 1/16-spoke with wife at length at bedside, Dr. Hulen Skains also had a long conversation with spouse-desires to continue current aggressive care and desires PEG and trach placed 1/18 perc trach and peg 11/05/2022 transferred to floor due to need for ICU bed 11/08/2022 transition to [facility] with pulmonary following weekly for trach 2/4 recurrent fever, cultures collected. 2/5 changed to cuffless trach 2/18 antibiotics started for Pseudomonas on tracheal aspirate cultre 2/22 concern for myoclonic seizure, neuro reconsulted 2/26 transferred to ICU for secretions.  3/1-3/3: Respiratory status has improved.  Completed 10 days of Zosyn. Okay for transfer back to progressive floor.  Assessment/Plan:   Goals of care: IPAL from 11/07/2022, he has been DNR but transition back to full code. Remains full code persistent discussion with spouse by different providers.  Anoxic brain injury/myoclonic seizure activity: Neurology had a long discussion with the family on 10/14/2022 that she has sustained  irreversible anoxic brain injury that is catastrophic family was encouraged to move towards comfort measures. EEG on 11/16/2022 show myoclonic seizures. Neurology  reconsulted due to right lower extremity movement: Ativan was added to her regimen. Last seen by neurology on 12/08/2022, LTM discontinued by neurology. Patient is currently on Keppra, Depakote and clonazepam. Clonazepam was decreased on 12/18/2022. Patient has had no neurological improvement in the last 2 weeks. Talk to the wife over the weekend and today she has unrealistic expectations.  Acute hypoxic respiratory failure/ARDS due to influenza A and superimposed pneumococcal pneumonia: He completed course of Tamiflu, Rocephin then 7 days of Zosyn. 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  Pulmonary following on a weekly basis tracheostomy was exchanged on 12/13/2022. Has been experiencing significant secretions requiring suctioning. Patient was transferred back to the ICU due to to significant secretions, underwent chest physiotherapy. On 12/13/2022 was cleared by pulmonary to transfer out of the ICU. Repeated chest x-ray on 12/17/2022 showed no acute findings With no leukocytosis, with a Tmax of 98.7.  CBC 2 days ago was 9 which is improved from previous.  Pseudomonas pneumonia versus tracheitis: Culture on 12/01/2022 Pseudomonas resistant to imipenem. She completed 10-day course of Zosyn on 12/16/2022.  He also received a few days of levofloxacin. Patient is an tobramycin nebulizer treatment.  PEA arrest: Continue cardiac monitoring.  Recurrent fever/leukocytosis: Has had intermittent fevers throughout her hospital stay she has received multiple courses of antibiotics. She has remained afebrile, and his leukocytosis has resolved. He has completed courses of antibiotics and antifungal last day on 12/18/2022. He continues to be on inhaled tobramycin. Has had significant increase secretion check chest x-ray  Paroxysmal sympathetic hyperactivity/sinus tachycardia: Continue metoprolol 200 mg twice a day plus diltiazem low-dose. Continue IV metoprolol as needed for heart rate greater than  110. Heart rate is hard to control will have low threshold for IV metoprolol.  Can titrate oral due to exam if needed. Continue Neurontin.  TSH unremarkable. Echocardiogram shows a low EF without significant valvular abnormalities.  Constipation: Laxatives were held due to frequent bowel movements.  Hypotension: Continue midodrine blood pressure stable.  Severe protein caloric malnutrition: Continue tube feedings, PEG tube placed on 11/01/2022.  Prediabetes mellitus: A1c of 5.9 not requiring sliding scale for several days continue CBGs check.  Hypovolemic hyponatremia: Resolved with increased free water.  Diarrhea: Has resolved rectal tube in place will discontinue.  Sacral decubitus ulcer stage II RN Pressure Injury Documentation: Pressure Injury 10/12/22 Sacrum Mid;Upper Stage 2 -  Partial thickness loss of dermis presenting as a shallow open injury with a red, pink wound bed without slough. (Active)  10/12/22 0200  Location: Sacrum  Location Orientation: Mid;Upper  Staging: Stage 2 -  Partial thickness loss of dermis presenting as a shallow open injury with a red, pink wound bed without slough.  Wound Description (Comments):   Present on Admission: Yes  Dressing Type Foam - Lift dressing to assess site every shift 12/24/22 2015    DVT prophylaxis: lovenox Family Communication:  Wife,  the previous physician spoke to the wife. She mentioned that she thinks patient is improving because he now has his eyes open. She mentions that all providers have been lying to her about his chances of recovery. She wanted to know the names of all of his medications which were provided to her. She asked about PT for the patient. She wants PT to work him. She continues to believe that patient will recover. She mentioned that she likes to meet  with providers in person so that she can "see when they are lying". She didn't have any further questions.  Status is: Inpatient Remains inpatient  appropriate because: Devastating anoxic brain injury.    Code Status:     Code Status Orders  (From admission, onward)           Start     Ordered   11/07/22 1116  Full code  Continuous       Question:  By:  Answer:  Consent: discussion documented in EHR   11/07/22 1115           Code Status History     Date Active Date Inactive Code Status Order ID Comments User Context   10/09/2022 1851 11/07/2022 1115 DNR [id number]  Garner Nash, DO Inpatient   10/03/2022 0808 10/09/2022 1850 Full Code [id number]  Jonnie Finner, DO ED         IV Access:   Peripheral IV   Procedures and diagnostic studies:   DG CHEST PORT 1 VIEW  Result Date: 12/24/2022 CLINICAL DATA:  Increased tracheal secretions EXAM: PORTABLE CHEST 1 VIEW COMPARISON:  08/19/2023 FINDINGS: Tracheostomy tube unchanged. Stable cardiac silhouette. There is mild airspace density in LEFT and RIGHT lower lobe not changed from prior. Upper lungs clear. IMPRESSION: 1. No interval change. 2. Bibasilar atelectasis versus infiltrate. Electronically Signed   By: Suzy Bouchard M.D.   On: 12/24/2022 09:04     Medical Consultants:   None.   Subjective:    Damon [name] nonverbal  Objective:    Vitals:   12/24/22 2319 12/24/22 2333 12/25/22 0313 12/25/22 0337  BP:  (!) 126/98 (!) 131/101 (!) 131/101  Pulse:  (!) 105 (!) 123 (!) 127  Resp:  (!) 28 (!) 22 (!) 29  Temp:  98.8 F (37.1 C) 97.7 F (36.5 C)   TempSrc:  Axillary Oral   SpO2: 99% 97% 95% 98%  Weight:      Height:       SpO2: 98 % O2 Flow Rate (L/min): 6 L/min FiO2 (%): 21 %   Intake/Output Summary (Last 24 hours) at 12/25/2022 0729 Last data filed at 12/25/2022 0400 Gross per 24 hour  Intake 700 ml  Output 1600 ml  Net -900 ml    Filed Weights   12/22/22 0708 12/23/22 0500 12/24/22 0703  Weight: 66 kg 61 kg 61 kg    Exam: General exam: In no acute distress. Respiratory system: Good air movement and clear to  auscultation. Cardiovascular system: S1 & S2 heard, RRR. No JVD. Gastrointestinal system: Abdomen is nondistended, soft and nontender.  Extremities: No pedal edema. Skin: No rashes, lesions or ulcers Data Reviewed:    Labs: Basic Metabolic Panel: Recent Labs  Lab 12/21/22 0819 12/24/22 0907 12/25/22 0335  NA 138 135 137  K 4.4 4.4 4.4  CL 103 99 102  CO2 '24 24 22  '$ GLUCOSE 96 126* 103*  BUN 42* 31* 40*  CREATININE 0.57* 0.57* 0.63  CALCIUM 9.5 9.5 9.9    GFR Estimated Creatinine Clearance: 82.2 mL/min (by C-G formula based on SCr of 0.63 mg/dL). Liver Function Tests: No results for input(s): "AST", "ALT", "ALKPHOS", "BILITOT", "PROT", "ALBUMIN" in the last 168 hours.  No results for input(s): "LIPASE", "AMYLASE" in the last 168 hours. No results for input(s): "AMMONIA" in the last 168 hours. Coagulation profile No results for input(s): "INR", "PROTIME" in the last 168 hours. COVID-19 Labs  No results for input(s): "DDIMER", "FERRITIN", "LDH", "CRP" in  the last 72 hours.  Lab Results  Component Value Date   Strum NEGATIVE 10/01/2022    CBC: Recent Labs  Lab 12/21/22 0819  WBC 9.6  NEUTROABS 5.4  HGB 11.4*  HCT 36.7*  MCV 90.8  PLT 314    Cardiac Enzymes: No results for input(s): "CKTOTAL", "CKMB", "CKMBINDEX", "TROPONINI" in the last 168 hours. BNP (last 3 results) No results for input(s): "PROBNP" in the last 8760 hours. CBG: Recent Labs  Lab 12/23/22 1629 12/23/22 2323 12/24/22 0741 12/24/22 1543 12/24/22 2343  GLUCAP 133* 135* 125* 163* 115*    D-Dimer: No results for input(s): "DDIMER" in the last 72 hours. Hgb A1c: No results for input(s): "HGBA1C" in the last 72 hours. Lipid Profile: No results for input(s): "CHOL", "HDL", "LDLCALC", "TRIG", "CHOLHDL", "LDLDIRECT" in the last 72 hours. Thyroid function studies: No results for input(s): "TSH", "T4TOTAL", "T3FREE", "THYROIDAB" in the last 72 hours.  Invalid input(s):  "FREET3"  Anemia work up: No results for input(s): "VITAMINB12", "FOLATE", "FERRITIN", "TIBC", "IRON", "RETICCTPCT" in the last 72 hours. Sepsis Labs: Recent Labs  Lab 12/21/22 0819  WBC 9.6    Microbiology No results found for this or any previous visit (from the past 240 hour(s)).    Medications:    Chlorhexidine Gluconate Cloth  6 each Topical Daily   clonazepam  0.5 mg Oral BID   diltiazem  30 mg Oral Q6H   enoxaparin (LOVENOX) injection  40 mg Subcutaneous Q24H   famotidine  20 mg Per Tube QHS   feeding supplement (PROSource TF20)  60 mL Per Tube BID   fiber supplement (BANATROL TF)  60 mL Per Tube TID   free water  200 mL Per Tube Q8H   gabapentin  100 mg Per Tube Q8H   guaiFENesin  400 mg Per Tube Q8H   leptospermum manuka honey  1 Application Topical QHS   levETIRAcetam  1,500 mg Per Tube BID   metoprolol tartrate  200 mg Per Tube BID   midodrine  5 mg Per Tube Q8H   nutrition supplement (JUVEN)  1 packet Per Tube BID BM   mouth rinse  15 mL Mouth Rinse 4 times per day   scopolamine  1 patch Transdermal Q72H   tobramycin (PF)  300 mg Nebulization BID   valproic acid  500 mg Per Tube Q8H   Continuous Infusions:  sodium chloride 250 mL (12/13/22 0041)   feeding supplement (OSMOLITE 1.5 CAL) 70 mL/hr at 12/24/22 1311      LOS: 83 days   Charlynne Cousins  [facility]  12/25/2022, 7:29 AM

## 2022-12-26 DIAGNOSIS — J9601 Acute respiratory failure with hypoxia: Secondary | ICD-10-CM | POA: Diagnosis not present

## 2022-12-26 LAB — BASIC METABOLIC PANEL
Anion gap: 10 (ref 5–15)
BUN: 44 mg/dL — ABNORMAL HIGH (ref 6–20)
CO2: 23 mmol/L (ref 22–32)
Calcium: 9.4 mg/dL (ref 8.9–10.3)
Chloride: 104 mmol/L (ref 98–111)
Creatinine, Ser: 0.56 mg/dL — ABNORMAL LOW (ref 0.61–1.24)
GFR, Estimated: >60 mL/min (ref >60)
Glucose, Bld: 116 mg/dL — ABNORMAL HIGH (ref 70–99)
Potassium: 4.2 mmol/L (ref 3.5–5.1)
Sodium: 137 mmol/L (ref 135–145)

## 2022-12-26 LAB — GLUCOSE, CAPILLARY
Glucose-Capillary: 127 mg/dL — ABNORMAL HIGH (ref 70–99)
Glucose-Capillary: 160 mg/dL — ABNORMAL HIGH (ref 70–99)

## 2022-12-26 NOTE — Hospital Course (Addendum)
Damon [name] is an 62 y.o. male past medical history of hypertension, hyperlipidemia tobacco abuse, chronic back pain comes into the [place] [facility] ED on 10/03/2022 for influenza A protracted hospitalization   Significant Events: 12/18 Flu A positive 12/20 Admitted, bipap, 5 minute code MRSA PCR - neg Urine strep _POS 12/21 Intubated, paralyzed, ARDS protocol, on 7cc/kg 12/22 Weaned to 6 cc/kg with dyssynchrony, back on 7 cc/kg, driving pressures good, weaning vent 12/23 Attempt to wean sedation again with dyssynchrony and desaturation.  Some concern for cuff leak, tube exchange.  Cuff did not appear to be blown.  Ongoing signs of cuff leak, query leaking around cough, possible large trachea, diuresing 12/25 tolerating PSV, myoclonus> ceribell, Neuro consult, changed to propofol, diuresed  12/26 Changed to DNR. On Propofol, versed.  Tolerating PSV.  MRI brain with hypoxic/anoxic injury Blood culture neg 12/27 GPD's on EEG, pending transfer to [facility] for cEEG NEURO CONSULT 12/28 moved to [facility] for LTM EEG 12/31 family updated by neuro: no chance for meaningful recovery, they should make plans for comfort measures 1/1 cultures - resp normal flora 1/2 eeg still with epileptogenicity. Palli fam mtg -- family does not want to hear from medical teams unless there is "good news"  1/3  cEEG with burst suppression w highly epileptiform bursts.  1/4 cEEG with burst suppression  + highly epileptiform bursts still.  WBC slightly up to 22.5. Temp high 99 low 100  c-EEG stopped duie to lack of benefit Pallative care consult - wife upset about early dc from ER on 12/18 Chaplain consult: Wife is sufering from anticipatory grief + accepting god's way + curious/anger about events leading upto current state EEG: generalized epileptogenicity with high potential for seizures as well as profound diffuse encephalopathy. In the setting of cardiac arrest, this EEG pattern is suggestive of anoxic/hypoxic brain injury.  1/5  - Low grade fever +. [place] 22.5K.  on Zosyn. On vent fio2 30%. TF +. Per RN - diprivan stopped yesterday. No myoclonus today. Mild tachycardia + Rpt pall care on 10/21/22 per wife 1/6 - On vent FiO2 30%.  Tmax 99,72F ., WBC better. Still off diprivan -> No myoclonus. cXR visualized - devices in place and faiure clear Oral oxy and klonpin stopped 1/8 No significant changes in neuro status. Tachycardic to 130s, hypertensive to 140s. Coreg transitioned to metoprolol. 1/11 wife considering trach/peg, to discuss with surgery-discussed with Dr. Bobbye Morton 1/12 more tachycardic 1/16-spoke with wife at length at bedside, Dr. Hulen Skains also had a long conversation with spouse-desires to continue current aggressive care and desires PEG and trach placed 1/18 perc trach and peg 11/05/2022 transferred to floor due to need for ICU bed 11/08/2022 transition to [facility] with pulmonary following weekly for trach 2/4 recurrent fever, cultures collected. 2/5 changed to cuffless trach 2/18 antibiotics started for Pseudomonas on tracheal aspirate cultre 2/22 concern for myoclonic seizure, neuro reconsulted 2/26 transferred to ICU for secretions.  3/1-3/3: Respiratory status has improved.  Completed 10 days of Zosyn. Okay for transfer back to progressive floor. 3/13-17: No changes. Stable. 3/18Lurline Idol exchange

## 2022-12-26 NOTE — Progress Notes (Signed)
Patients BP 98/77. Scheduled Cardizem not administered due to BP parameters. Pts HR is still in the 120s. Pts BP too low for PRN metoprolol. Clarene Essex, NP notified.

## 2022-12-26 NOTE — Progress Notes (Signed)
PROGRESS NOTE    Damon [name]  [id number] DOB: 1961-07-21 DOA: 10/03/2022 PCP: [facility]   Brief Narrative: Damon [name] is an 62 y.o. male past medical history of hypertension, hyperlipidemia tobacco abuse, chronic back pain comes into the [place] [facility] ED on 10/03/2022 for influenza A protracted hospitalization   Significant Events: 12/18 Flu A positive 12/20 Admitted, bipap, 5 minute code MRSA PCR - neg Urine strep _POS 12/21 Intubated, paralyzed, ARDS protocol, on 7cc/kg 12/22 Weaned to 6 cc/kg with dyssynchrony, back on 7 cc/kg, driving pressures good, weaning vent 12/23 Attempt to wean sedation again with dyssynchrony and desaturation.  Some concern for cuff leak, tube exchange.  Cuff did not appear to be blown.  Ongoing signs of cuff leak, query leaking around cough, possible large trachea, diuresing 12/25 tolerating PSV, myoclonus> ceribell, Neuro consult, changed to propofol, diuresed  12/26 Changed to DNR. On Propofol, versed.  Tolerating PSV.  MRI brain with hypoxic/anoxic injury Blood culture neg 12/27 GPD's on EEG, pending transfer to [facility] for cEEG NEURO CONSULT 12/28 moved to [facility] for LTM EEG 12/31 family updated by neuro: no chance for meaningful recovery, they should make plans for comfort measures 1/1 cultures - resp normal flora 1/2 eeg still with epileptogenicity. Palli fam mtg -- family does not want to hear from medical teams unless there is "good news"  1/3  cEEG with burst suppression w highly epileptiform bursts.  1/4 cEEG with burst suppression  + highly epileptiform bursts still.  WBC slightly up to 22.5. Temp high 99 low 100  c-EEG stopped duie to lack of benefit Pallative care consult - wife upset about early dc from ER on 12/18 Chaplain consult: Wife is sufering from anticipatory grief + accepting god's way + curious/anger about events leading upto current state EEG: generalized epileptogenicity with high potential for seizures as well  as profound diffuse encephalopathy. In the setting of cardiac arrest, this EEG pattern is suggestive of anoxic/hypoxic brain injury.  1/5 - Low grade fever +. [place] 22.5K.  on Zosyn. On vent fio2 30%. TF +. Per RN - diprivan stopped yesterday. No myoclonus today. Mild tachycardia + Rpt pall care on 10/21/22 per wife 1/6 - On vent FiO2 30%.  Tmax 99,53F ., WBC better. Still off diprivan -> No myoclonus. cXR visualized - devices in place and faiure clear Oral oxy and klonpin stopped 1/8 No significant changes in neuro status. Tachycardic to 130s, hypertensive to 140s. Coreg transitioned to metoprolol. 1/11 wife considering trach/peg, to discuss with surgery-discussed with Dr. Bobbye Morton 1/12 more tachycardic 1/16-spoke with wife at length at bedside, Dr. Hulen Skains also had a long conversation with spouse-desires to continue current aggressive care and desires PEG and trach placed 1/18 perc trach and peg 11/05/2022 transferred to floor due to need for ICU bed 11/08/2022 transition to [facility] with pulmonary following weekly for trach 2/4 recurrent fever, cultures collected. 2/5 changed to cuffless trach 2/18 antibiotics started for Pseudomonas on tracheal aspirate cultre 2/22 concern for myoclonic seizure, neuro reconsulted 2/26 transferred to ICU for secretions.  3/1-3/3: Respiratory status has improved.  Completed 10 days of Zosyn. Okay for transfer back to progressive floor.   Assessment and Plan:  Anoxic brain injury Myoclonic seizure activity Neurology consulted. High degree of medical certainty that patient will not regain prior function with recommendations for comfort measures. EEG (2/2) with evidence of myoclonic seizures. Patient managed on Keppra, Depakote and Klonopin -Continue Keppra, Depakote and Klonopin  Acute respiratory failure with hypoxia ARDS  secondary to influenza A infection Pneumococcal/pseudomonas pneumonia Patient was treated with Tamiflu for influenza  infection and Zosyn for pneumonia. Patient required intubation, mechanical ventilation and eventual tracheostomy (1/18). Currently stable. -Continue tracheostomy collar; supplemental oxygen  PEA arrest Noted.  Paroxysmal sympathetic hyperactivity Sinus tachycardia -Continue metoprolol and diltiazem  Constipation Managed with bowel regimen. Resolved.  Hypotension -Continue midodrine  Severe malnutrition -Continue tube feeds via PEG  Hypovolemic hyponatremia Resolved  Diarrhea Resolved.  Pressure injury Mid/upper sacrum, present on admission.   DVT prophylaxis: Lovenox Code Status:   Code Status: Full Code Family Communication: None at bedside Disposition Plan: Discharge pending continued goals of care discussions/placement   Consultants:    Procedures:    Antimicrobials:     Subjective: Patient is non-verbal  Objective: BP 106/89 (BP Location: Right Arm)   Pulse (!) 127   Temp 98.9 F (37.2 C) (Oral)   Resp 16   Ht '5\' 4"'$  (1.626 m)   Wt 61 kg   SpO2 99%   BMI 23.08 kg/m   Examination:  General exam: Appears calm and comfortable Respiratory system: Clear to auscultation. Respiratory effort normal. Tracheostomy in place. Cardiovascular system: S1 & S2 heard, RRR. No murmurs, rubs, gallops or clicks. Gastrointestinal system: Abdomen is nondistended, soft and nontender. No organomegaly or masses felt. Normal bowel sounds heard. Central nervous system: Eyes are open but does not appear to be alert. Not oriented. Does not appear to respond to verbal or tactile stimuli. Does not localize to painful stimuli. Musculoskeletal: No edema. No calf tenderness   Data Reviewed: I have personally reviewed following labs and imaging studies  CBC Lab Results  Component Value Date   WBC 9.6 12/21/2022   RBC 4.04 (L) 12/21/2022   HGB 11.4 (L) 12/21/2022   HCT 36.7 (L) 12/21/2022   MCV 90.8 12/21/2022   MCH 28.2 12/21/2022   PLT 314 12/21/2022   MCHC 31.1  12/21/2022   RDW 15.4 12/21/2022   LYMPHSABS 2.0 12/21/2022   MONOABS 1.4 (H) 12/21/2022   EOSABS 0.2 12/21/2022   BASOSABS 0.1 [id number]     Last metabolic panel Lab Results  Component Value Date   NA 137 12/26/2022   K 4.2 12/26/2022   CL 104 12/26/2022   CO2 23 12/26/2022   BUN 44 (H) 12/26/2022   CREATININE 0.56 (L) 12/26/2022   GLUCOSE 116 (H) 12/26/2022   GFRNONAA >60 12/26/2022   GFRAA >60 03/19/2016   CALCIUM 9.4 12/26/2022   PHOS 4.5 12/18/2022   PROT 7.0 12/14/2022   ALBUMIN 2.3 (L) 12/14/2022   BILITOT 0.4 12/14/2022   ALKPHOS 65 12/14/2022   AST 27 12/14/2022   ALT 36 12/14/2022   ANIONGAP 10 12/26/2022    GFR: Estimated Creatinine Clearance: 82.2 mL/min (A) (by C-G formula based on SCr of 0.56 mg/dL (L)).  No results found for this or any previous visit (from the past 240 hour(s)).    Radiology Studies: No results found.    LOS: 63 days    Cordelia Poche, MD [facility] 12/26/2022, 10:09 AM   If 7PM-7AM, please contact night-coverage [email or web address]

## 2022-12-26 NOTE — Progress Notes (Signed)
Physical Therapy Treatment Patient Details Name: Damon [name] MRN: [id number] DOB: 1961/03/23 Today's Date: 12/26/2022   History of Present Illness PT is a 62 yo male admitted to [facility] with flu on 10-30-22. Pt coded 10/31/23 and was intubated. Trach and PEG placed 11/01/22. MRI of brain revealed "Fairly symmetric diffusion-weighted and T2 FLAIR hyperintense  signal abnormality within the bilateral basal ganglia. Subtle signal  changes are also suspected within the bilateral hippocampi. Given  the provided history, these findings are compatible with acute  hypoxic/ischemic injury". PMH: HLD, HTN, back pain.    PT Comments    Patient lethargic initially did open eyes after stimulation with cool cloth to face, rubbing chest and starting UE ROM.  Patient with R LE extensor tone noted in response to flexion on L LE.  Remains with limited knee flexion to about 30 degrees.  Patient up on EOB did not display attempt at head control despite cues, increased time and facilitation.  He did eventually look at me on his L side after cues and significant delay.  PT will follow up as per plan of care.    Recommendations for follow up therapy are one component of a multi-disciplinary discharge planning process, led by the attending physician.  Recommendations may be updated based on patient status, additional functional criteria and insurance authorization.  Follow Up Recommendations  Long-term institutional care without follow-up therapy     Assistance Recommended at Discharge Frequent or constant Supervision/Assistance  Patient can return home with the following Two people to help with walking and/or transfers;Two people to help with bathing/dressing/bathroom;Assistance with cooking/housework;Direct supervision/assist for medications management;Direct supervision/assist for financial management;Assistance with feeding;Assist for transportation;Help with stairs or ramp for entrance   Equipment Recommendations   Wheelchair (measurements PT);Wheelchair cushion (measurements PT);Hospital bed;Other (comment) (hoyer lift, air mattress)    Recommendations for Other Services       Precautions / Restrictions Precautions Precautions: Fall Precaution Comments: PEG (abdominal binder); Trach     Mobility  Bed Mobility Overal bed mobility: Needs Assistance   Rolling: Total assist   Supine to sit: Total assist Sit to supine: Total assist   General bed mobility comments: dependent for up to EOB moving legs and lifting trunk and scooting hips; assist for legs and trunk to supine and +2 for repositioning    Transfers                        Ambulation/Gait                   Stairs             Wheelchair Mobility    Modified Rankin (Stroke Patients Only)       Balance Overall balance assessment: Needs assistance   Sitting balance-[name]: Zero Sitting balance - Comments: assist for head and trunk control in sitting, no attempt to assist with head control with cues, increased time and facilitation                                    Cognition Arousal/Alertness: Lethargic Behavior During Therapy: Flat affect Overall Cognitive Status: Impaired/Different from baseline                                 General Comments: no communication efforts, did make eye contact after several seconds for  attention, but only on L side with much stimulation and increased time        Exercises Other Exercises Other Exercises: PROM UE/LE in supine; stretch to R knee into extension with 30 sec holds x 4 with pressure into foot; available range to approx 30 degrees    General Comments        Pertinent Vitals/Pain Pain Assessment Breathing: normal Negative Vocalization: none Facial Expression: smiling or inexpressive Body Language: tense, distressed pacing, fidgeting Consolability: no need to console PAINAD Score: 1 Pain Location: mild R  extensor tone increase with some ROM efforts Pain Descriptors / Indicators: Other (Comment) (increased extensor tone on R) Pain Intervention(s): Monitored during session    Home Living                          Prior Function            PT Goals (current goals can now be found in the care plan section) Progress towards PT goals: Not progressing toward goals - comment    Frequency    Min 1X/week      PT Plan Current plan remains appropriate    Co-evaluation              AM-PAC PT "6 Clicks" Mobility   Outcome Measure  Help needed turning from your back to your side while in a flat bed without using bedrails?: Total Help needed moving from lying on your back to sitting on the side of a flat bed without using bedrails?: Total Help needed moving to and from a bed to a chair (including a wheelchair)?: Total Help needed standing up from a chair using your arms (e.g., wheelchair or bedside chair)?: Total Help needed to walk in hospital room?: Total Help needed climbing 3-5 steps with a railing? : Total 6 Click Score: 6    End of Session Equipment Utilized During Treatment: Oxygen Activity Tolerance: Patient tolerated treatment well Patient left: in bed   PT Visit Diagnosis: Other symptoms and signs involving the nervous system (R29.898);Other abnormalities of gait and mobility (R26.89)     Time: [id number] PT Time Calculation (min) (ACUTE ONLY): 27 min  Charges:  $Therapeutic Exercise: 8-22 mins $Therapeutic Activity: 8-22 mins                     Magda Kiel, PT Acute Rehabilitation Services [phone] 12/26/2022    Reginia Naas 12/26/2022, 1:06 PM

## 2022-12-27 LAB — GLUCOSE, CAPILLARY
Glucose-Capillary: 115 mg/dL — ABNORMAL HIGH (ref 70–99)
Glucose-Capillary: 117 mg/dL — ABNORMAL HIGH (ref 70–99)
Glucose-Capillary: 129 mg/dL — ABNORMAL HIGH (ref 70–99)
Glucose-Capillary: 146 mg/dL — ABNORMAL HIGH (ref 70–99)

## 2022-12-27 NOTE — Progress Notes (Signed)
PROGRESS NOTE    Damon [name]  [id number] DOB: Jan 28, 1961 DOA: 10/03/2022 PCP: [facility]   Brief Narrative: Damon [name] is an 62 y.o. male past medical history of hypertension, hyperlipidemia tobacco abuse, chronic back pain comes into the [facility] long ED on 10/03/2022 for influenza A protracted hospitalization   Significant Events: 12/18 Flu A positive 12/20 Admitted, bipap, 5 minute code MRSA PCR - neg Urine strep _POS 12/21 Intubated, paralyzed, ARDS protocol, on 7cc/kg 12/22 Weaned to 6 cc/kg with dyssynchrony, back on 7 cc/kg, driving pressures good, weaning vent 12/23 Attempt to wean sedation again with dyssynchrony and desaturation.  Some concern for cuff leak, tube exchange.  Cuff did not appear to be blown.  Ongoing signs of cuff leak, query leaking around cough, possible large trachea, diuresing 12/25 tolerating PSV, myoclonus> ceribell, Neuro consult, changed to propofol, diuresed  12/26 Changed to DNR. On Propofol, versed.  Tolerating PSV.  MRI brain with hypoxic/anoxic injury Blood culture neg 12/27 GPD's on EEG, pending transfer to [facility] for cEEG NEURO CONSULT 12/28 moved to [facility] for LTM EEG 12/31 family updated by neuro: no chance for meaningful recovery, they should make plans for comfort measures 1/1 cultures - resp normal flora 1/2 eeg still with epileptogenicity. Palli fam mtg -- family does not want to hear from medical teams unless there is "good news"  1/3  cEEG with burst suppression w highly epileptiform bursts.  1/4 cEEG with burst suppression  + highly epileptiform bursts still.  WBC slightly up to 22.5. Temp high 99 low 100  c-EEG stopped duie to lack of benefit Pallative care consult - wife upset about early dc from ER on 12/18 Chaplain consult: Wife is sufering from anticipatory grief + accepting god's way + curious/anger about events leading upto current state EEG: generalized epileptogenicity with high potential for seizures as well  as profound diffuse encephalopathy. In the setting of cardiac arrest, this EEG pattern is suggestive of anoxic/hypoxic brain injury.  1/5 - Low grade fever +. [place] 22.5K.  on Zosyn. On vent fio2 30%. TF +. Per RN - diprivan stopped yesterday. No myoclonus today. Mild tachycardia + Rpt pall care on 10/21/22 per wife 1/6 - On vent FiO2 30%.  Tmax 99,34F ., WBC better. Still off diprivan -> No myoclonus. cXR visualized - devices in place and faiure clear Oral oxy and klonpin stopped 1/8 No significant changes in neuro status. Tachycardic to 130s, hypertensive to 140s. Coreg transitioned to metoprolol. 1/11 wife considering trach/peg, to discuss with surgery-discussed with Dr. Bobbye Morton 1/12 more tachycardic 1/16-spoke with wife at length at bedside, Dr. Hulen Skains also had a long conversation with spouse-desires to continue current aggressive care and desires PEG and trach placed 1/18 perc trach and peg 11/05/2022 transferred to floor due to need for ICU bed 11/08/2022 transition to [facility] with pulmonary following weekly for trach 2/4 recurrent fever, cultures collected. 2/5 changed to cuffless trach 2/18 antibiotics started for Pseudomonas on tracheal aspirate cultre 2/22 concern for myoclonic seizure, neuro reconsulted 2/26 transferred to ICU for secretions.  3/1-3/3: Respiratory status has improved.  Completed 10 days of Zosyn. Okay for transfer back to progressive floor.   Assessment and Plan:  Anoxic brain injury Myoclonic seizure activity Neurology consulted. High degree of medical certainty that patient will not regain prior function with recommendations for comfort measures. EEG (2/2) with evidence of myoclonic seizures. Patient managed on Keppra, Depakote and Klonopin -Continue Keppra, Depakote and Klonopin  Acute respiratory failure with hypoxia ARDS  secondary to influenza A infection Pneumococcal/pseudomonas pneumonia Patient was treated with Tamiflu for influenza  infection and Zosyn for pneumonia. Patient required intubation, mechanical ventilation and eventual tracheostomy (1/18). Currently stable. -Continue tracheostomy collar; supplemental oxygen  PEA arrest Noted.  Paroxysmal sympathetic hyperactivity Sinus tachycardia -Continue metoprolol and diltiazem  Constipation Managed with bowel regimen. Resolved.  Hypotension -Continue midodrine  Severe malnutrition -Continue tube feeds via PEG  Hypovolemic hyponatremia Resolved  Diarrhea Resolved.  Pressure injury Mid/upper sacrum, present on admission.   DVT prophylaxis: Lovenox Code Status:   Code Status: Full Code Family Communication: None at bedside Disposition Plan: Discharge pending continued goals of care discussions/placement   Consultants:  PCCM Neurology Palliative care medicine General surgery  Procedures:  12/23: Intubation 10/08/22; 10/10/22; 10/11/2022-10/18/2022; 2/22; 2/22-2/24: EEG  Antimicrobials: Tobramycin Zosyn Fluconazole Levofloxacin Cefepime Ceftriaxone Flagyl Oseltamivir   Subjective: Patient is non-verbal  Objective: BP 115/84 (BP Location: Right Arm)   Pulse (!) 116   Temp 99.3 F (37.4 C) (Axillary)   Resp 19   Ht '5\' 4"'$  (1.626 m)   Wt 61 kg   SpO2 95%   BMI 23.08 kg/m   Examination:  General exam: Appears calm and comfortable Respiratory system: Clear to auscultation. Respiratory effort normal. Cardiovascular system: S1 & S2 heard, RRR. Gastrointestinal system: Abdomen is nondistended, soft and nontender. No organomegaly or masses felt. Normal bowel sounds heard. Central nervous system: Alert. Responds to voice cues by turning opening his eyes. Does not follow commands. Does not track. Musculoskeletal: No edema. No calf tenderness   Data Reviewed: I have personally reviewed following labs and imaging studies  CBC Lab Results  Component Value Date   WBC 9.6 12/21/2022   RBC 4.04 (L) 12/21/2022   HGB 11.4 (L)  12/21/2022   HCT 36.7 (L) 12/21/2022   MCV 90.8 12/21/2022   MCH 28.2 12/21/2022   PLT 314 12/21/2022   MCHC 31.1 12/21/2022   RDW 15.4 12/21/2022   LYMPHSABS 2.0 12/21/2022   MONOABS 1.4 (H) 12/21/2022   EOSABS 0.2 12/21/2022   BASOSABS 0.1 Q000111Q     Last metabolic panel Lab Results  Component Value Date   NA 137 12/26/2022   K 4.2 12/26/2022   CL 104 12/26/2022   CO2 23 12/26/2022   BUN 44 (H) 12/26/2022   CREATININE 0.56 (L) 12/26/2022   GLUCOSE 116 (H) 12/26/2022   GFRNONAA >60 12/26/2022   GFRAA >60 03/19/2016   CALCIUM 9.4 12/26/2022   PHOS 4.5 12/18/2022   PROT 7.0 12/14/2022   ALBUMIN 2.3 (L) 12/14/2022   BILITOT 0.4 12/14/2022   ALKPHOS 65 12/14/2022   AST 27 12/14/2022   ALT 36 12/14/2022   ANIONGAP 10 12/26/2022    GFR: Estimated Creatinine Clearance: 82.2 mL/min (A) (by C-G formula based on SCr of 0.56 mg/dL (L)).  No results found for this or any previous visit (from the past 240 hour(s)).    Radiology Studies: No results found.    LOS: 85 days    Cordelia Poche, MD [facility] 12/27/2022, 4:16 PM   If 7PM-7AM, please contact night-coverage [email or web address]

## 2022-12-27 NOTE — Progress Notes (Signed)
Patient has been added to the DTP list for discharge planning.  For all discharge planning needs or questions, please reach out to me directly.  CSW will follow for discharge planning.  Madilyn Fireman, MSW, LCSW Transitions of Care  Clinical Social Worker II [phone]

## 2022-12-28 LAB — GLUCOSE, CAPILLARY
Glucose-Capillary: 95 mg/dL (ref 70–99)
Glucose-Capillary: 146 mg/dL — ABNORMAL HIGH (ref 70–99)
Glucose-Capillary: 122 mg/dL — ABNORMAL HIGH (ref 70–99)

## 2022-12-28 MED ORDER — CLONAZEPAM 0.25 MG PO TBDP
0.5000 mg | ORAL_TABLET | Freq: Two times a day (BID) | ORAL | Status: DC
  Administered 2022-12-28 – 2023-01-06 (×18): 0.5 mg
  Filled 2022-12-28 (×18): qty 2

## 2022-12-28 MED ORDER — DILTIAZEM HCL 60 MG PO TABS
60.0000 mg | ORAL_TABLET | Freq: Four times a day (QID) | ORAL | Status: DC
  Administered 2022-12-28 – 2023-01-22 (×94): 60 mg
  Filled 2022-12-28 (×106): qty 1

## 2022-12-28 MED ORDER — ONDANSETRON HCL 4 MG/2ML IJ SOLN: 4.0000 mg | Freq: Four times a day (QID) | INTRAMUSCULAR | Status: DC | PRN

## 2022-12-28 MED ORDER — ONDANSETRON HCL 4 MG PO TABS: 4.0000 mg | ORAL_TABLET | Freq: Four times a day (QID) | ORAL | Status: DC | PRN

## 2022-12-28 NOTE — Progress Notes (Signed)
PROGRESS NOTE    Damon [name]  [id number] DOB: 1961-07-21 DOA: 10/03/2022 PCP: [facility]   Brief Narrative: Damon [name] is an 62 y.o. male past medical history of hypertension, hyperlipidemia tobacco abuse, chronic back pain comes into the [facility] long ED on 10/03/2022 for influenza A protracted hospitalization   Significant Events: 12/18 Flu A positive 12/20 Admitted, bipap, 5 minute code MRSA PCR - neg Urine strep _POS 12/21 Intubated, paralyzed, ARDS protocol, on 7cc/kg 12/22 Weaned to 6 cc/kg with dyssynchrony, back on 7 cc/kg, driving pressures good, weaning vent 12/23 Attempt to wean sedation again with dyssynchrony and desaturation.  Some concern for cuff leak, tube exchange.  Cuff did not appear to be blown.  Ongoing signs of cuff leak, query leaking around cough, possible large trachea, diuresing 12/25 tolerating PSV, myoclonus> ceribell, Neuro consult, changed to propofol, diuresed  12/26 Changed to DNR. On Propofol, versed.  Tolerating PSV.  MRI brain with hypoxic/anoxic injury Blood culture neg 12/27 GPD's on EEG, pending transfer to [facility] for cEEG NEURO CONSULT 12/28 moved to [facility] for LTM EEG 12/31 family updated by neuro: no chance for meaningful recovery, they should make plans for comfort measures 1/1 cultures - resp normal flora 1/2 eeg still with epileptogenicity. Palli fam mtg -- family does not want to hear from medical teams unless there is "good news"  1/3  cEEG with burst suppression w highly epileptiform bursts.  1/4 cEEG with burst suppression  + highly epileptiform bursts still.  WBC slightly up to 22.5. Temp high 99 low 100  c-EEG stopped duie to lack of benefit Pallative care consult - wife upset about early dc from ER on 12/18 Chaplain consult: Wife is sufering from anticipatory grief + accepting god's way + curious/anger about events leading upto current state EEG: generalized epileptogenicity with high potential for seizures as well  as profound diffuse encephalopathy. In the setting of cardiac arrest, this EEG pattern is suggestive of anoxic/hypoxic brain injury.  1/5 - Low grade fever +. [place] 22.5K.  on Zosyn. On vent fio2 30%. TF +. Per RN - diprivan stopped yesterday. No myoclonus today. Mild tachycardia + Rpt pall care on 10/21/22 per wife 1/6 - On vent FiO2 30%.  Tmax 99,53F ., WBC better. Still off diprivan -> No myoclonus. cXR visualized - devices in place and faiure clear Oral oxy and klonpin stopped 1/8 No significant changes in neuro status. Tachycardic to 130s, hypertensive to 140s. Coreg transitioned to metoprolol. 1/11 wife considering trach/peg, to discuss with surgery-discussed with Dr. Bobbye Morton 1/12 more tachycardic 1/16-spoke with wife at length at bedside, Dr. Hulen Skains also had a long conversation with spouse-desires to continue current aggressive care and desires PEG and trach placed 1/18 perc trach and peg 11/05/2022 transferred to floor due to need for ICU bed 11/08/2022 transition to [facility] service with pulmonary following weekly for trach 2/4 recurrent fever, cultures collected. 2/5 changed to cuffless trach 2/18 antibiotics started for Pseudomonas on tracheal aspirate cultre 2/22 concern for myoclonic seizure, neuro reconsulted 2/26 transferred to ICU for secretions.  3/1-3/3: Respiratory status has improved.  Completed 10 days of Zosyn. Okay for transfer back to progressive floor.   Assessment and Plan:  Anoxic brain injury Myoclonic seizure activity Neurology consulted. High degree of medical certainty that patient will not regain prior function with recommendations for comfort measures. EEG (2/2) with evidence of myoclonic seizures. Patient managed on Keppra, Depakote and Klonopin -Continue Keppra, Depakote and Klonopin  Acute respiratory failure with hypoxia ARDS  secondary to influenza A infection Pneumococcal/pseudomonas pneumonia Patient was treated with Tamiflu for influenza  infection and Zosyn for pneumonia. Patient required intubation, mechanical ventilation and eventual tracheostomy (1/18). Currently stable. -Continue tracheostomy collar; supplemental oxygen  PEA arrest Noted.  Paroxysmal sympathetic hyperactivity Sinus tachycardia -Continue metoprolol and diltiazem  Constipation Managed with bowel regimen. Resolved.  Hypotension -Continue midodrine  Severe malnutrition -Continue tube feeds via PEG  Hypovolemic hyponatremia Resolved  Diarrhea Resolved.  Pressure injury Mid/upper sacrum, present on admission.   DVT prophylaxis: Lovenox Code Status:   Code Status: Full Code Family Communication: None at bedside. Called wife x2 but no answer Disposition Plan: Discharge pending continued goals of care discussions/placement   Consultants:  PCCM Neurology Palliative care medicine General surgery  Procedures:  12/23: Intubation 10/08/22; 10/10/22; 10/11/2022-10/18/2022; 2/22; 2/22-2/24: EEG  Antimicrobials: Tobramycin Zosyn Fluconazole Levofloxacin Cefepime Ceftriaxone Flagyl Oseltamivir   Subjective: Patient is non-verbal  Objective: BP 112/77   Pulse 99   Temp 98.6 F (37 C) (Axillary)   Resp 12   Ht 5\' 4"  (1.626 m)   Wt 61 kg   SpO2 95%   BMI 23.08 kg/m   Examination:  General exam: Appears calm and comfortable. Resting in bed. Respiratory system: Clear to auscultation. Respiratory effort normal. Cardiovascular system: S1 & S2 heard, RRR. Gastrointestinal system: Abdomen is nondistended, soft and nontender. Decreased bowel sounds heard. Central nervous system: Alert.   Data Reviewed: I have personally reviewed following labs and imaging studies  CBC Lab Results  Component Value Date   WBC 9.6 12/21/2022   RBC 4.04 (L) 12/21/2022   HGB 11.4 (L) 12/21/2022   HCT 36.7 (L) 12/21/2022   MCV 90.8 12/21/2022   MCH 28.2 12/21/2022   PLT 314 12/21/2022   MCHC 31.1 12/21/2022   RDW 15.4 12/21/2022    LYMPHSABS 2.0 12/21/2022   MONOABS 1.4 (H) 12/21/2022   EOSABS 0.2 12/21/2022   BASOSABS 0.1 Q000111Q     Last metabolic panel Lab Results  Component Value Date   NA 137 12/26/2022   K 4.2 12/26/2022   CL 104 12/26/2022   CO2 23 12/26/2022   BUN 44 (H) 12/26/2022   CREATININE 0.56 (L) 12/26/2022   GLUCOSE 116 (H) 12/26/2022   GFRNONAA >60 12/26/2022   GFRAA >60 03/19/2016   CALCIUM 9.4 12/26/2022   PHOS 4.5 12/18/2022   PROT 7.0 12/14/2022   ALBUMIN 2.3 (L) 12/14/2022   BILITOT 0.4 12/14/2022   ALKPHOS 65 12/14/2022   AST 27 12/14/2022   ALT 36 12/14/2022   ANIONGAP 10 12/26/2022    GFR: Estimated Creatinine Clearance: 82.2 mL/min (A) (by C-G formula based on SCr of 0.56 mg/dL (L)).  No results found for this or any previous visit (from the past 240 hour(s)).    Radiology Studies: No results found.    LOS: 31 days    Cordelia Poche, MD [facility] 12/28/2022, 8:09 AM   If 7PM-7AM, please contact night-coverage [email or web address]

## 2022-12-29 LAB — GLUCOSE, CAPILLARY
Glucose-Capillary: 116 mg/dL — ABNORMAL HIGH (ref 70–99)
Glucose-Capillary: 129 mg/dL — ABNORMAL HIGH (ref 70–99)
Glucose-Capillary: 140 mg/dL — ABNORMAL HIGH (ref 70–99)

## 2022-12-29 NOTE — Progress Notes (Signed)
Patient with tachycardia. Giving PRN Tylenol for comfort. Virgilio Frees RN

## 2022-12-29 NOTE — Progress Notes (Signed)
PROGRESS NOTE    Damon [name]  [id number] DOB: 1961-07-21 DOA: 10/03/2022 PCP: [facility]   Brief Narrative: Damon [name] is an 62 y.o. male past medical history of hypertension, hyperlipidemia tobacco abuse, chronic back pain comes into the [facility] long ED on 10/03/2022 for influenza A protracted hospitalization   Significant Events: 12/18 Flu A positive 12/20 Admitted, bipap, 5 minute code MRSA PCR - neg Urine strep _POS 12/21 Intubated, paralyzed, ARDS protocol, on 7cc/kg 12/22 Weaned to 6 cc/kg with dyssynchrony, back on 7 cc/kg, driving pressures good, weaning vent 12/23 Attempt to wean sedation again with dyssynchrony and desaturation.  Some concern for cuff leak, tube exchange.  Cuff did not appear to be blown.  Ongoing signs of cuff leak, query leaking around cough, possible large trachea, diuresing 12/25 tolerating PSV, myoclonus> ceribell, Neuro consult, changed to propofol, diuresed  12/26 Changed to DNR. On Propofol, versed.  Tolerating PSV.  MRI brain with hypoxic/anoxic injury Blood culture neg 12/27 GPD's on EEG, pending transfer to [facility] for cEEG NEURO CONSULT 12/28 moved to [facility] for LTM EEG 12/31 family updated by neuro: no chance for meaningful recovery, they should make plans for comfort measures 1/1 cultures - resp normal flora 1/2 eeg still with epileptogenicity. Palli fam mtg -- family does not want to hear from medical teams unless there is "good news"  1/3  cEEG with burst suppression w highly epileptiform bursts.  1/4 cEEG with burst suppression  + highly epileptiform bursts still.  WBC slightly up to 22.5. Temp high 99 low 100  c-EEG stopped duie to lack of benefit Pallative care consult - wife upset about early dc from ER on 12/18 Chaplain consult: Wife is sufering from anticipatory grief + accepting god's way + curious/anger about events leading upto current state EEG: generalized epileptogenicity with high potential for seizures as well  as profound diffuse encephalopathy. In the setting of cardiac arrest, this EEG pattern is suggestive of anoxic/hypoxic brain injury.  1/5 - Low grade fever +. [place] 22.5K.  on Zosyn. On vent fio2 30%. TF +. Per RN - diprivan stopped yesterday. No myoclonus today. Mild tachycardia + Rpt pall care on 10/21/22 per wife 1/6 - On vent FiO2 30%.  Tmax 99,53F ., WBC better. Still off diprivan -> No myoclonus. cXR visualized - devices in place and faiure clear Oral oxy and klonpin stopped 1/8 No significant changes in neuro status. Tachycardic to 130s, hypertensive to 140s. Coreg transitioned to metoprolol. 1/11 wife considering trach/peg, to discuss with surgery-discussed with Dr. Bobbye Morton 1/12 more tachycardic 1/16-spoke with wife at length at bedside, Dr. Hulen Skains also had a long conversation with spouse-desires to continue current aggressive care and desires PEG and trach placed 1/18 perc trach and peg 11/05/2022 transferred to floor due to need for ICU bed 11/08/2022 transition to [facility] with pulmonary following weekly for trach 2/4 recurrent fever, cultures collected. 2/5 changed to cuffless trach 2/18 antibiotics started for Pseudomonas on tracheal aspirate cultre 2/22 concern for myoclonic seizure, neuro reconsulted 2/26 transferred to ICU for secretions.  3/1-3/3: Respiratory status has improved.  Completed 10 days of Zosyn. Okay for transfer back to progressive floor.   Assessment and Plan:  Anoxic brain injury Myoclonic seizure activity Neurology consulted. High degree of medical certainty that patient will not regain prior function with recommendations for comfort measures. EEG (2/2) with evidence of myoclonic seizures. Patient managed on Keppra, Depakote and Klonopin -Continue Keppra, Depakote and Klonopin  Acute respiratory failure with hypoxia ARDS  secondary to influenza A infection Pneumococcal/pseudomonas pneumonia Patient was treated with Tamiflu for influenza  infection and Zosyn for pneumonia. Patient required intubation, mechanical ventilation and eventual tracheostomy (1/18). Currently stable. -Continue tracheostomy collar; supplemental oxygen  PEA arrest Noted.  Paroxysmal sympathetic hyperactivity Sinus tachycardia -Continue metoprolol and diltiazem  Constipation Managed with bowel regimen. Resolved.  Hypotension -Continue midodrine  Severe malnutrition -Continue tube feeds via PEG  Hypovolemic hyponatremia Resolved  Diarrhea Resolved.  Pressure injury Mid/upper sacrum, present on admission.   DVT prophylaxis: Lovenox Code Status:   Code Status: Full Code Family Communication: None at bedside. Called wife via telephone but no answer Disposition Plan: Discharge pending continued goals of care discussions/placement   Consultants:  PCCM Neurology Palliative care medicine General surgery  Procedures:  12/23: Intubation 10/08/22; 10/10/22; 10/11/2022-10/18/2022; 2/22; 2/22-2/24: EEG  Antimicrobials: Tobramycin Zosyn Fluconazole Levofloxacin Cefepime Ceftriaxone Flagyl Oseltamivir   Subjective: Patient is non-verbal  Objective: BP 107/81   Pulse 99   Temp 98.5 F (36.9 C) (Axillary)   Resp 14   Ht 5\' 4"  (1.626 m)   Wt 64 kg   SpO2 95%   BMI 24.22 kg/m   Examination:  General exam: Appears calm and comfortable Respiratory system: Mild rhonchi. Respiratory effort normal. Cardiovascular system: S1 & S2 heard, RRR. Gastrointestinal system: Abdomen is nondistended, soft and nontender. Decreased bowel sounds heard. Musculoskeletal: No edema.   Data Reviewed: I have personally reviewed following labs and imaging studies  CBC Lab Results  Component Value Date   WBC 9.6 12/21/2022   RBC 4.04 (L) 12/21/2022   HGB 11.4 (L) 12/21/2022   HCT 36.7 (L) 12/21/2022   MCV 90.8 12/21/2022   MCH 28.2 12/21/2022   PLT 314 12/21/2022   MCHC 31.1 12/21/2022   RDW 15.4 12/21/2022   LYMPHSABS 2.0 12/21/2022    MONOABS 1.4 (H) 12/21/2022   EOSABS 0.2 12/21/2022   BASOSABS 0.1 [id number]     Last metabolic panel Lab Results  Component Value Date   NA 137 12/26/2022   K 4.2 12/26/2022   CL 104 12/26/2022   CO2 23 12/26/2022   BUN 44 (H) 12/26/2022   CREATININE 0.56 (L) 12/26/2022   GLUCOSE 116 (H) 12/26/2022   GFRNONAA >60 12/26/2022   GFRAA >60 03/19/2016   CALCIUM 9.4 12/26/2022   PHOS 4.5 12/18/2022   PROT 7.0 12/14/2022   ALBUMIN 2.3 (L) 12/14/2022   BILITOT 0.4 12/14/2022   ALKPHOS 65 12/14/2022   AST 27 12/14/2022   ALT 36 12/14/2022   ANIONGAP 10 12/26/2022    GFR: Estimated Creatinine Clearance: 82.2 mL/min (A) (by C-G formula based on SCr of 0.56 mg/dL (L)).  No results found for this or any previous visit (from the past 240 hour(s)).    Radiology Studies: No results found.    LOS: 85 days    Cordelia Poche, MD [facility] 12/29/2022, 12:06 PM   If 7PM-7AM, please contact night-coverage [email or web address]

## 2022-12-30 ENCOUNTER — Inpatient Hospital Stay (HOSPITAL_COMMUNITY): Payer: Medicare Other

## 2022-12-30 LAB — GLUCOSE, CAPILLARY
Glucose-Capillary: 120 mg/dL — ABNORMAL HIGH (ref 70–99)
Glucose-Capillary: 118 mg/dL — ABNORMAL HIGH (ref 70–99)
Glucose-Capillary: 113 mg/dL — ABNORMAL HIGH (ref 70–99)
Glucose-Capillary: 155 mg/dL — ABNORMAL HIGH (ref 70–99)

## 2022-12-30 NOTE — Progress Notes (Signed)
PROGRESS NOTE    [name]  [id number] DOB: 1961-07-21 DOA: 10/03/2022 PCP: [facility]   Brief Narrative: Damon [name] is an 62 y.o. male past medical history of hypertension, hyperlipidemia tobacco abuse, chronic back pain comes into the [facility] long ED on 10/03/2022 for influenza A protracted hospitalization   Significant Events: 12/18 Flu A positive 12/20 Admitted, bipap, 5 minute code MRSA PCR - neg Urine strep _POS 12/21 Intubated, paralyzed, ARDS protocol, on 7cc/kg 12/22 Weaned to 6 cc/kg with dyssynchrony, back on 7 cc/kg, driving pressures good, weaning vent 12/23 Attempt to wean sedation again with dyssynchrony and desaturation.  Some concern for cuff leak, tube exchange.  Cuff did not appear to be blown.  Ongoing signs of cuff leak, query leaking around cough, possible large trachea, diuresing 12/25 tolerating PSV, myoclonus> ceribell, Neuro consult, changed to propofol, diuresed  12/26 Changed to DNR. On Propofol, versed.  Tolerating PSV.  MRI brain with hypoxic/anoxic injury Blood culture neg 12/27 GPD's on EEG, pending transfer to [facility] for cEEG NEURO CONSULT 12/28 moved to [facility] for LTM EEG 12/31 family updated by neuro: no chance for meaningful recovery, they should make plans for comfort measures 1/1 cultures - resp normal flora 1/2 eeg still with epileptogenicity. Palli fam mtg -- family does not want to hear from medical teams unless there is "good news"  1/3  cEEG with burst suppression w highly epileptiform bursts.  1/4 cEEG with burst suppression  + highly epileptiform bursts still.  WBC slightly up to 22.5. Temp high 99 low 100  c-EEG stopped duie to lack of benefit Pallative care consult - wife upset about early dc from ER on 12/18 Chaplain consult: Wife is sufering from anticipatory grief + accepting god's way + curious/anger about events leading upto current state EEG: generalized epileptogenicity with high potential for seizures as well  as profound diffuse encephalopathy. In the setting of cardiac arrest, this EEG pattern is suggestive of anoxic/hypoxic brain injury.  1/5 - Low grade fever +. [place] 22.5K.  on Zosyn. On vent fio2 30%. TF +. Per RN - diprivan stopped yesterday. No myoclonus today. Mild tachycardia + Rpt pall care on 10/21/22 per wife 1/6 - On vent FiO2 30%.  Tmax 99,53F ., WBC better. Still off diprivan -> No myoclonus. cXR visualized - devices in place and faiure clear Oral oxy and klonpin stopped 1/8 No significant changes in neuro status. Tachycardic to 130s, hypertensive to 140s. Coreg transitioned to metoprolol. 1/11 wife considering trach/peg, to discuss with surgery-discussed with Dr. Bobbye Morton 1/12 more tachycardic 1/16-spoke with wife at length at bedside, Dr. Hulen Skains also had a long conversation with spouse-desires to continue current aggressive care and desires PEG and trach placed 1/18 perc trach and peg 11/05/2022 transferred to floor due to need for ICU bed 11/08/2022 transition to [facility] with pulmonary following weekly for trach 2/4 recurrent fever, cultures collected. 2/5 changed to cuffless trach 2/18 antibiotics started for Pseudomonas on tracheal aspirate cultre 2/22 concern for myoclonic seizure, neuro reconsulted 2/26 transferred to ICU for secretions.  3/1-3/3: Respiratory status has improved.  Completed 10 days of Zosyn. Okay for transfer back to progressive floor.   Assessment and Plan:  Anoxic brain injury Myoclonic seizure activity Neurology consulted. High degree of medical certainty that patient will not regain prior function with recommendations for comfort measures. EEG (2/2) with evidence of myoclonic seizures. Patient managed on Keppra, Depakote and Klonopin -Continue Keppra, Depakote and Klonopin  Acute respiratory failure with hypoxia ARDS  secondary to influenza A infection Pneumococcal/pseudomonas pneumonia Patient was treated with Tamiflu for influenza  infection and Zosyn for pneumonia. Patient required intubation, mechanical ventilation and eventual tracheostomy (1/18). Currently stable. -Continue tracheostomy collar; supplemental oxygen -Repeat chest x-ray  PEA arrest Noted.  Paroxysmal sympathetic hyperactivity Sinus tachycardia -Continue metoprolol and diltiazem  Constipation Managed with bowel regimen. Resolved.  Hypotension -Continue midodrine  Severe malnutrition -Continue tube feeds via PEG  Hypovolemic hyponatremia Resolved  Diarrhea Resolved.  Pressure injury Mid/upper sacrum, present on admission.   DVT prophylaxis: Lovenox Code Status:   Code Status: Full Code Family Communication: None at bedside. Disposition Plan: Discharge pending continued goals of care discussions/placement   Consultants:  PCCM Neurology Palliative care medicine General surgery  Procedures:  12/23: Intubation 10/08/22; 10/10/22; 10/11/2022-10/18/2022; 2/22; 2/22-2/24: EEG  Antimicrobials: Tobramycin Zosyn Fluconazole Levofloxacin Cefepime Ceftriaxone Flagyl Oseltamivir   Subjective: Patient is non-verbal  Objective: BP (!) 133/115   Pulse (!) 120   Temp 98.8 F (37.1 C) (Axillary)   Resp 17   Ht 5\' 4"  (1.626 m)   Wt 64 kg   SpO2 96%   BMI 24.22 kg/m   Examination:  General exam: Appears calm and comfortable Respiratory system: Rhonchi. Respiratory effort normal. Cardiovascular system: S1 & S2 heard, RRR. No murmurs, rubs, gallops or clicks. Gastrointestinal system: Abdomen is non-distended and  soft. No organomegaly or masses felt. Normal bowel sounds heard. Central nervous system: Eyes open, does not appear to respond to voice    Data Reviewed: I have personally reviewed following labs and imaging studies  CBC Lab Results  Component Value Date   WBC 9.6 12/21/2022   RBC 4.04 (L) 12/21/2022   HGB 11.4 (L) 12/21/2022   HCT 36.7 (L) 12/21/2022   MCV 90.8 12/21/2022   MCH 28.2 12/21/2022   PLT  314 12/21/2022   MCHC 31.1 12/21/2022   RDW 15.4 12/21/2022   LYMPHSABS 2.0 12/21/2022   MONOABS 1.4 (H) 12/21/2022   EOSABS 0.2 12/21/2022   BASOSABS 0.1 Q000111Q     Last metabolic panel Lab Results  Component Value Date   NA 137 12/26/2022   K 4.2 12/26/2022   CL 104 12/26/2022   CO2 23 12/26/2022   BUN 44 (H) 12/26/2022   CREATININE 0.56 (L) 12/26/2022   GLUCOSE 116 (H) 12/26/2022   GFRNONAA >60 12/26/2022   GFRAA >60 03/19/2016   CALCIUM 9.4 12/26/2022   PHOS 4.5 12/18/2022   PROT 7.0 12/14/2022   ALBUMIN 2.3 (L) 12/14/2022   BILITOT 0.4 12/14/2022   ALKPHOS 65 12/14/2022   AST 27 12/14/2022   ALT 36 12/14/2022   ANIONGAP 10 12/26/2022    GFR: Estimated Creatinine Clearance: 82.2 mL/min (A) (by C-G formula based on SCr of 0.56 mg/dL (L)).  No results found for this or any previous visit (from the past 240 hour(s)).    Radiology Studies: No results found.    LOS: 88 days    Cordelia Poche, MD [facility] 12/30/2022, 9:40 AM   If 7PM-7AM, please contact night-coverage [email or web address]

## 2022-12-31 DIAGNOSIS — Z930 Tracheostomy status: Secondary | ICD-10-CM | POA: Diagnosis not present

## 2022-12-31 LAB — GLUCOSE, CAPILLARY
Glucose-Capillary: 110 mg/dL — ABNORMAL HIGH (ref 70–99)
Glucose-Capillary: 131 mg/dL — ABNORMAL HIGH (ref 70–99)

## 2022-12-31 NOTE — Procedures (Signed)
Tracheostomy Change Note  Patient Details:   Name: Damon [name] DOB: [birth year] MRN: [id number]    Airway Documentation:     Evaluation  O2 sats: stable throughout Complications: No apparent complications Patient did tolerate procedure well. Bilateral Breath Sounds: Rhonchi   2 week trach changed per protocol. Confirmed with CCM. Confirmed with bilateral breath sounds and a positive color change with ETCO2 detector.  Trenda Moots 12/31/2022, 4:28 PM

## 2022-12-31 NOTE — Progress Notes (Addendum)
NAME:  Cristyan Spore, MRN:  [id number], DOB:  23-Jan-1961, LOS: 6 ADMISSION DATE:  10/03/2022, CONSULTATION DATE:  10/03/2022 REFERRING MD:  Marylyn Ishihara - [facility] CHIEF COMPLAINT:  Dyspnea   History of Present Illness:  62 year old man admitted to [facility] 12/20 in the setting of acute hypoxemic respiratory failure due to Flu A. PMHx significant for HTN, HLD, chronic back pain, tobacco abuse   Placed on BIPAP.  Coded (likely in the setting of hypoxia), initial rhythm PEA with CPR x 5 minutes, required intubation.  After several days on mechanical ventilation remains comatose, MRI Brain 12/26 worrisome for anoxic brain injury.  Transferred to [facility] for LTM EEG monitoring.  Neurology consulted. LTM with generalized epileptogenicity. Treated with Propofol, Depakote, Keppra, Ketamine. 12/31 Neurology with goals of care with family. Relayed that patient has a grim neurological recovery due to irreversible anoxic injury. Family wished to continue aggressive measures.   Palliative care consulted. Stay complicated with neuro-storming.   Surgical consult regarding PEG/trach.  Pertinent Medical History:   Past Medical History:  Diagnosis Date   Elevated lipids    Hypertension    Tobacco abuse    Significant Hospital Events: Including procedures, antibiotic start and stop dates in addition to other pertinent events   12/18 Flu A positive 12/20 Admitted, 5 minute code after non-compliance with BiPAP. MRSA PCR - neg, Urine strep > POS 12/21 Intubated, paralyzed, ARDS protocol, on 7cc/kg 12/22 Weaned to 6 cc/kg with dyssynchrony, back on 7 cc/kg, driving pressures good, weaning vent 12/23 Attempt to wean sedation again with dyssynchrony and desaturation.  Some concern for cuff leak, tube exchange.  Cuff did not appear to be blown.  Ongoing signs of cuff leak, query leaking around cough, possible large trachea, diuresing 12/25 tolerating PSV, myoclonus> ceribell, Neuro consult, changed to propofol, diuresed  12/26  Changed to DNR. On Propofol, versed.  Tolerating PSV.  MRI brain with hypoxic/anoxic injury. Blood culture neg 12/27 GPD's on EEG, pending transfer to [facility] for cEEG. NEURO CONSULT 12/28 moved to [facility] for LTM EEG 12/31 family updated by neuro: no chance for meaningful recovery, they should make plans for comfort measures 1/1 cultures - resp normal flora 1/2 eeg still with epileptogenicity. Palli fam mtg -- family does not want to hear from medical teams unless there is "good news"  1/3  cEEG with burst suppression w highly epileptiform bursts.  1/4 cEEG with burst suppression  + highly epileptiform bursts still.  WBC slightly up to 22.5. Temp high 99 low 100  c-EEG stopped due to lack of benefit Pallative care consult - wife upset about early dc from ER on 12/18 Chaplain consult: Wife is sufering from anticipatory grief + accepting god's way + curious/anger about events leading upto current state EEG: generalized epileptogenicity with high potential for seizures as well as profound diffuse encephalopathy. In the setting of cardiac arrest, this EEG pattern is suggestive of anoxic/hypoxic brain injury.  1/5 Low grade fever +. [place] 22.5K.  on Zosyn. On vent fio2 30%. TF +. Per RN - diprivan stopped yesterday. No myoclonus today. Mild tachycardia + Rpt pall care on 10/21/22 per wife 1/6 On vent FiO2 30%.  Tmax 99,61F ., WBC better. Still off diprivan -> No myoclonus. cXR visualized - devices in place and faiure clear. Oral oxy and klonpin stopped 1/8 No significant changes in neuro status. Tachycardic to 130s, hypertensive to 140s. Coreg transitioned to metoprolol. 1/11 wife considering trach/peg, to discuss with surgery-discussed with Dr. Bobbye Morton 1/12 more tachycardic 1/16-spoke with  wife at length at bedside, Dr. Hulen Skains also had a long conversation with spouse-desires to continue current aggressive care and desires PEG and trach placed 1/18 perc trach and peg 11/05/2022 transferred to floor due to need for  ICU bed 11/08/2022 transition to [facility] with pulmonary following weekly for trach 2/5 ATC 28%, no acute issues with trach  2/26 Copious tracheal sections in the setting of pseudomonas pneumonia resulting in need to move to ICU for close monitoring/ RT care. 2/29 >> Trach change 12/17/2022>> Waiting on Progressive bed 3/18 Cont ATC. Due for trach change    Interim History / Subjective:  Wife at bedside Thick secretions  Objective:  Blood pressure (!) 126/102, pulse (!) 123, temperature 98.6 F (37 C), temperature source Oral, resp. rate (!) 26, height 5\' 4"  (1.626 m), weight 60 kg, SpO2 96 %.    FiO2 (%):  [21 %] 21 %   Intake/Output Summary (Last 24 hours) at 12/31/2022 1358 Last data filed at 12/31/2022 1221 Gross per 24 hour  Intake 1621.66 ml  Output 2500 ml  Net -878.34 ml  BP (!) 126/102   Pulse (!) 123   Temp 98.6 F (37 C) (Oral)   Resp (!) 26   Ht 5\' 4"  (1.626 m)   Wt 60 kg   SpO2 96%   BMI 22.71 kg/m   Filed Weights   12/28/22 0703 12/29/22 0500 12/30/22 0701  Weight: 64 kg 64 kg 60 kg   Physical Exam: General: Ill adult M  HEENT: Trach secure copious thick white secretions  Neck: 6 cuffless trach  Neuro: Eyes are open does not follow commands  CV: rr  PULM: Upper rhonchi. Symmetrical chest expansion  GI: soft  Extremities: no acute joint deformity  Skin: clean warm    Resolved   Flu A AKI CAP, pneumococcal pneumonia - s/p Zosyn 12/31 - 1/7; Ceftriaxone 12/20- 12/27, Azithro 12/20, Flagyl 12/20 - 12/22 Vent dependence  Assessment & Plan:  Problems: Anoxic brain injury  Seizures Paroxysmal sympathetic hyperactivity Fever Leukocytosis  Anemia Hyperkalemia Tachycardia/hypotension due to paroxysmal sympathetic hyperactivity Severe protein calorie malnutrition Hyperglycemia    Acute respiratory failure with hypoxia Tracheostomy dependence Hx ARDS, flu and aspiration   Hx pseudomonas PNA  Atelectasis  -unlikely to be  decannulation candidate  -completed tobi nebs 3/14 -6 uncuffed  Plan: -Cont routine trach care -- d/w RRT, plan for trach change 3/18 -wouldn't downsize yet given burden of thick secretions  -pulm hygiene -PRN imaging  -SpO2 goal > 90    Eliseo Gum MSN, AGACNP-BC [place] for pager  12/31/2022, 1:58 PM

## 2022-12-31 NOTE — Progress Notes (Signed)
PROGRESS NOTE    Damon [name]  [id number] DOB: 1961-07-21 DOA: 10/03/2022 PCP: [facility]   Brief Narrative: Damon [name] is an 62 y.o. male past medical history of hypertension, hyperlipidemia tobacco abuse, chronic back pain comes into the [facility] long ED on 10/03/2022 for influenza A protracted hospitalization   Significant Events: 12/18 Flu A positive 12/20 Admitted, bipap, 5 minute code MRSA PCR - neg Urine strep _POS 12/21 Intubated, paralyzed, ARDS protocol, on 7cc/kg 12/22 Weaned to 6 cc/kg with dyssynchrony, back on 7 cc/kg, driving pressures good, weaning vent 12/23 Attempt to wean sedation again with dyssynchrony and desaturation.  Some concern for cuff leak, tube exchange.  Cuff did not appear to be blown.  Ongoing signs of cuff leak, query leaking around cough, possible large trachea, diuresing 12/25 tolerating PSV, myoclonus> ceribell, Neuro consult, changed to propofol, diuresed  12/26 Changed to DNR. On Propofol, versed.  Tolerating PSV.  MRI brain with hypoxic/anoxic injury Blood culture neg 12/27 GPD's on EEG, pending transfer to [facility] for cEEG NEURO CONSULT 12/28 moved to [facility] for LTM EEG 12/31 family updated by neuro: no chance for meaningful recovery, they should make plans for comfort measures 1/1 cultures - resp normal flora 1/2 eeg still with epileptogenicity. Palli fam mtg -- family does not want to hear from medical teams unless there is "good news"  1/3  cEEG with burst suppression w highly epileptiform bursts.  1/4 cEEG with burst suppression  + highly epileptiform bursts still.  WBC slightly up to 22.5. Temp high 99 low 100  c-EEG stopped duie to lack of benefit Pallative care consult - wife upset about early dc from ER on 12/18 Chaplain consult: Wife is sufering from anticipatory grief + accepting god's way + curious/anger about events leading upto current state EEG: generalized epileptogenicity with high potential for seizures as well  as profound diffuse encephalopathy. In the setting of cardiac arrest, this EEG pattern is suggestive of anoxic/hypoxic brain injury.  1/5 - Low grade fever +. [place] 22.5K.  on Zosyn. On vent fio2 30%. TF +. Per RN - diprivan stopped yesterday. No myoclonus today. Mild tachycardia + Rpt pall care on 10/21/22 per wife 1/6 - On vent FiO2 30%.  Tmax 99,53F ., WBC better. Still off diprivan -> No myoclonus. cXR visualized - devices in place and faiure clear Oral oxy and klonpin stopped 1/8 No significant changes in neuro status. Tachycardic to 130s, hypertensive to 140s. Coreg transitioned to metoprolol. 1/11 wife considering trach/peg, to discuss with surgery-discussed with Dr. Bobbye Morton 1/12 more tachycardic 1/16-spoke with wife at length at bedside, Dr. Hulen Skains also had a long conversation with spouse-desires to continue current aggressive care and desires PEG and trach placed 1/18 perc trach and peg 11/05/2022 transferred to floor due to need for ICU bed 11/08/2022 transition to [facility] with pulmonary following weekly for trach 2/4 recurrent fever, cultures collected. 2/5 changed to cuffless trach 2/18 antibiotics started for Pseudomonas on tracheal aspirate cultre 2/22 concern for myoclonic seizure, neuro reconsulted 2/26 transferred to ICU for secretions.  3/1-3/3: Respiratory status has improved.  Completed 10 days of Zosyn. Okay for transfer back to progressive floor.   Assessment and Plan:  Anoxic brain injury Myoclonic seizure activity Neurology consulted. High degree of medical certainty that patient will not regain prior function with recommendations for comfort measures. EEG (2/2) with evidence of myoclonic seizures. Patient managed on Keppra, Depakote and Klonopin -Continue Keppra, Depakote and Klonopin  Acute respiratory failure with hypoxia ARDS  secondary to influenza A infection Pneumococcal/pseudomonas pneumonia Patient was treated with Tamiflu for influenza  infection and Zosyn for pneumonia. Patient required intubation, mechanical ventilation and eventual tracheostomy (1/18). Currently stable. -Continue tracheostomy collar; supplemental oxygen -Repeat chest x-ray  PEA arrest Noted.  Paroxysmal sympathetic hyperactivity Sinus tachycardia -Continue metoprolol and diltiazem  Constipation Managed with bowel regimen. Resolved.  Hypotension -Continue midodrine  Severe malnutrition -Continue tube feeds via PEG  Hypovolemic hyponatremia Resolved  Diarrhea Resolved.  Pressure injury Mid/upper sacrum, present on admission.   DVT prophylaxis: Lovenox Code Status:   Code Status: Full Code Family Communication: None at bedside. Disposition Plan: Discharge pending continued goals of care discussions/placement   Consultants:  PCCM Neurology Palliative care medicine General surgery  Procedures:  12/23: Intubation 10/08/22; 10/10/22; 10/11/2022-10/18/2022; 2/22; 2/22-2/24: EEG  Antimicrobials: Tobramycin Zosyn Fluconazole Levofloxacin Cefepime Ceftriaxone Flagyl Oseltamivir   Subjective: Patient is non-verbal  Objective: BP (!) 104/90 (BP Location: Right Arm)   Pulse (!) 118   Temp 98.7 F (37.1 C) (Axillary)   Resp (!) 24   Ht 5\' 4"  (1.626 m)   Wt 60 kg   SpO2 95%   BMI 22.71 kg/m   Examination:  General exam: Appears calm and comfortable Respiratory system: Rhonchi. Respiratory effort normal. Cardiovascular system: S1 & S2 heard, tachycardia Gastrointestinal system: Abdomen is nondistended, soft and nontender. Central nervous system: Eyes open. Musculoskeletal: No edema. No calf tenderness   Data Reviewed: I have personally reviewed following labs and imaging studies  CBC Lab Results  Component Value Date   WBC 9.6 12/21/2022   RBC 4.04 (L) 12/21/2022   HGB 11.4 (L) 12/21/2022   HCT 36.7 (L) 12/21/2022   MCV 90.8 12/21/2022   MCH 28.2 12/21/2022   PLT 314 12/21/2022   MCHC 31.1 12/21/2022    RDW 15.4 12/21/2022   LYMPHSABS 2.0 12/21/2022   MONOABS 1.4 (H) 12/21/2022   EOSABS 0.2 12/21/2022   BASOSABS 0.1 [id number]     Last metabolic panel Lab Results  Component Value Date   NA 137 12/26/2022   K 4.2 12/26/2022   CL 104 12/26/2022   CO2 23 12/26/2022   BUN 44 (H) 12/26/2022   CREATININE 0.56 (L) 12/26/2022   GLUCOSE 116 (H) 12/26/2022   GFRNONAA >60 12/26/2022   GFRAA >60 03/19/2016   CALCIUM 9.4 12/26/2022   PHOS 4.5 12/18/2022   PROT 7.0 12/14/2022   ALBUMIN 2.3 (L) 12/14/2022   BILITOT 0.4 12/14/2022   ALKPHOS 65 12/14/2022   AST 27 12/14/2022   ALT 36 12/14/2022   ANIONGAP 10 12/26/2022    GFR: Estimated Creatinine Clearance: 82.2 mL/min (A) (by C-G formula based on SCr of 0.56 mg/dL (L)).  No results found for this or any previous visit (from the past 240 hour(s)).    Radiology Studies: DG CHEST PORT 1 VIEW  Result Date: 12/30/2022 CLINICAL DATA:  Acute respiratory failure EXAM: PORTABLE CHEST 1 VIEW COMPARISON:  12/24/2022 FINDINGS: Cardiac shadow is stable. Tracheostomy tube is noted in satisfactory position. Lungs are well aerated bilaterally. Mild right basilar atelectasis is again seen and stable. No new focal infiltrate is noted. IMPRESSION: Stable right basilar atelectasis. Electronically Signed   By: Inez Catalina M.D.   On: 12/30/2022 11:58      LOS: 89 days    Cordelia Poche, MD [facility] 12/31/2022, 8:13 AM   If 7PM-7AM, please contact night-coverage [email or web address]

## 2023-01-01 DIAGNOSIS — Z930 Tracheostomy status: Secondary | ICD-10-CM | POA: Diagnosis not present

## 2023-01-01 LAB — GLUCOSE, CAPILLARY
Glucose-Capillary: 130 mg/dL — ABNORMAL HIGH (ref 70–99)
Glucose-Capillary: 148 mg/dL — ABNORMAL HIGH (ref 70–99)
Glucose-Capillary: 148 mg/dL — ABNORMAL HIGH (ref 70–99)

## 2023-01-01 MED ORDER — OSMOLITE 1.5 CAL PO LIQD
1000.0000 mL | ORAL | Status: DC
  Administered 2023-01-02 – 2023-01-22 (×27): 1000 mL
  Filled 2023-01-01 (×9): qty 1000

## 2023-01-01 NOTE — Progress Notes (Addendum)
PROGRESS NOTE    Damon [name]  [id number] DOB: 1960-11-17 DOA: 10/03/2022 [facility]CP: [facility]   Brief Narrative: Damon [name] is an 62 y.o. male past medical history of hypertension, hyperlipidemia tobacco abuse, chronic back pain comes into the [place] [facility] long ED on 10/03/2022 for influenza A protracted hospitalization   Significant Events: 12/18 Flu A positive 12/20 Admitted, bipap, 5 minute code MRSA PCR - neg Urine strep _POS 12/21 Intubated, paralyzed, ARDS protocol, on 7cc/kg 12/22 Weaned to 6 cc/kg with dyssynchrony, back on 7 cc/kg, driving pressures good, weaning vent 12/23 Attempt to wean sedation again with dyssynchrony and desaturation.  Some concern for cuff leak, tube exchange.  Cuff did not appear to be blown.  Ongoing signs of cuff leak, query leaking around cough, possible large trachea, diuresing 12/25 tolerating PSV, myoclonus> ceribell, Neuro consult, changed to propofol, diuresed  12/26 Changed to DNR. On Propofol, versed.  Tolerating PSV.  MRI brain with hypoxic/anoxic injury Blood culture neg 12/27 GPD's on EEG, pending transfer to [facility] for cEEG NEURO CONSULT 12/28 moved to [facility] for LTM EEG 12/31 family updated by neuro: no chance for meaningful recovery, they should make plans for comfort measures 1/1 cultures - resp normal flora 1/2 eeg still with epileptogenicity. Palli fam mtg -- family does not want to hear from medical teams unless there is "good news"  1/3  cEEG with burst suppression w highly epileptiform bursts.  1/4 cEEG with burst suppression  + highly epileptiform bursts still.  WBC slightly up to 22.5. Temp high 99 low 100  c-EEG stopped duie to lack of benefit Pallative care consult - wife upset about early dc from ER on 12/18 Chaplain consult: Wife is sufering from anticipatory grief + accepting god's way + curious/anger about events leading upto current state EEG: generalized epileptogenicity with high potential for seizures as well  as profound diffuse encephalopathy. In the setting of cardiac arrest, this EEG pattern is suggestive of anoxic/hypoxic brain injury.  1/5 - Low grade fever +. [place] 22.5K.  on Zosyn. On vent fio2 30%. TF +. Per RN - diprivan stopped yesterday. No myoclonus today. Mild tachycardia + Rpt pall care on 10/21/22 per wife 1/6 - On vent FiO2 30%.  Tmax 99,82F ., WBC better. Still off diprivan -> No myoclonus. cXR visualized - devices in place and faiure clear Oral oxy and klonpin stopped 1/8 No significant changes in neuro status. Tachycardic to 130s, hypertensive to 140s. Coreg transitioned to metoprolol. 1/11 wife considering trach/peg, to discuss with surgery-discussed with Dr. Bobbye Morton 1/12 more tachycardic 1/16-spoke with wife at length at bedside, Dr. Hulen Skains also had a long conversation with spouse-desires to continue current aggressive care and desires PEG and trach placed 1/18 perc trach and peg 11/05/2022 transferred to floor due to need for ICU bed 11/08/2022 transition to [facility] with pulmonary following weekly for trach 2/4 recurrent fever, cultures collected. 2/5 changed to cuffless trach 2/18 antibiotics started for Pseudomonas on tracheal aspirate cultre 2/22 concern for myoclonic seizure, neuro reconsulted 2/26 transferred to ICU for secretions.  3/1-3/3: Respiratory status has improved.  Completed 10 days of Zosyn. Okay for transfer back to progressive floor. 3/13-17: No changes. Stable. 3/18: Trach exchange   Assessment and Plan:  Anoxic brain injury Myoclonic seizure activity Neurology consulted. High degree of medical certainty that patient will not regain prior function with recommendations for comfort measures. EEG (2/2) with evidence of myoclonic seizures. Patient managed on Keppra, Depakote and Klonopin -Continue Keppra, Depakote and Klonopin  Acute respiratory failure with hypoxia ARDS secondary to influenza A infection Pneumococcal/pseudomonas  pneumonia Patient was treated with Tamiflu for influenza infection and Zosyn for pneumonia. Patient required intubation, mechanical ventilation and eventual tracheostomy (1/18). Currently stable. -Continue tracheostomy collar; supplemental oxygen -Repeat chest x-ray  PEA arrest Noted.  Paroxysmal sympathetic hyperactivity Sinus tachycardia -Continue metoprolol and diltiazem  Constipation Managed with bowel regimen. Resolved.  Hypotension -Continue midodrine  Severe malnutrition PEG tube in place and tube feeds started. -Dietitian recommendations (3/19): Continue tube feeds via PEG: Increase Osmolite 1.5 to 65 ml/hr (1560 ml/day) PROSource TF20 60 ml BID Free water flushes of 200 ml q 8 hours   Tube feeding regimen and current free water flushes provide 2500 kcal, 138 grams of protein, and 1789 ml of H2O.    Continue Banatrol TF BID per tube, each packet provides 5 g of soluble fiber, to thicken stool consistency as pt still with rectal tube in place   Continue Juven BID per tube, each packet provides 80 calories, 8 grams of carbohydrate, 2.5 grams of protein (collagen), 7 grams of L-arginine and 7 grams of L-glutamine; supplement contains CaHMB, Vitamins C, E, B12 and Zinc to promote wound healing  Hypovolemic hyponatremia Resolved  Diarrhea Resolved.  Pressure injury Mid/upper sacrum, present on admission.   DVT prophylaxis: Lovenox Code Status:   Code Status: Full Code Family Communication: None at bedside. Disposition Plan: Discharge pending continued goals of care discussions/placement   Consultants:  PCCM Neurology Palliative care medicine General surgery  Procedures:  12/23: Intubation 10/08/22; 10/10/22; 10/11/2022-10/18/2022; 2/22; 2/22-2/24: EEG  Antimicrobials: Tobramycin Zosyn Fluconazole Levofloxacin Cefepime Ceftriaxone Flagyl Oseltamivir   Subjective: Patient is non-verbal  Objective: BP 106/79 (BP Location: Right Arm)   Pulse (!)  106   Temp 98.7 F (37.1 C) (Oral)   Resp 20   Ht 5\' 4"  (1.626 m)   Wt 60 kg   SpO2 96%   BMI 22.71 kg/m   Examination:  General exam: Appears calm and comfortable. Resting in bed. Respiratory system: Transmitted rhonchi. Respiratory effort normal. Cardiovascular system: S1 & S2 heard, RRR. Gastrointestinal system: Abdomen is non-distended and soft. Normal bowel sounds heard. Musculoskeletal: No edema. No calf tenderness    Data Reviewed: I have personally reviewed following labs and imaging studies  CBC Lab Results  Component Value Date   WBC 9.6 12/21/2022   RBC 4.04 (L) 12/21/2022   HGB 11.4 (L) 12/21/2022   HCT 36.7 (L) 12/21/2022   MCV 90.8 12/21/2022   MCH 28.2 12/21/2022   PLT 314 12/21/2022   MCHC 31.1 12/21/2022   RDW 15.4 12/21/2022   LYMPHSABS 2.0 12/21/2022   MONOABS 1.4 (H) 12/21/2022   EOSABS 0.2 12/21/2022   BASOSABS 0.1 54/06/8118     Last metabolic panel Lab Results  Component Value Date   NA 137 12/26/2022   K 4.2 12/26/2022   CL 104 12/26/2022   CO2 23 12/26/2022   BUN 44 (H) 12/26/2022   CREATININE 0.56 (L) 12/26/2022   GLUCOSE 116 (H) 12/26/2022   GFRNONAA >60 12/26/2022   GFRAA >60 03/19/2016   CALCIUM 9.4 12/26/2022   PHOS 4.5 12/18/2022   PROT 7.0 12/14/2022   ALBUMIN 2.3 (L) 12/14/2022   BILITOT 0.4 12/14/2022   ALKPHOS 65 12/14/2022   AST 27 12/14/2022   ALT 36 12/14/2022   ANIONGAP 10 12/26/2022    GFR: Estimated Creatinine Clearance: 82.2 mL/min (A) (by C-G formula based on SCr of 0.56 mg/dL (L)).  No results found for this  or any previous visit (from the past 240 hour(s)).    Radiology Studies: DG CHEST PORT 1 VIEW  Result Date: 12/30/2022 CLINICAL DATA:  Acute respiratory failure EXAM: PORTABLE CHEST 1 VIEW COMPARISON:  12/24/2022 FINDINGS: Cardiac shadow is stable. Tracheostomy tube is noted in satisfactory position. Lungs are well aerated bilaterally. Mild right basilar atelectasis is again seen and stable. No  new focal infiltrate is noted. IMPRESSION: Stable right basilar atelectasis. Electronically Signed   By: Inez Catalina M.D.   On: 12/30/2022 11:58      LOS: 90 days    Cordelia Poche, MD [facility] 01/01/2023, 9:52 AM   If 7PM-7AM, please contact night-coverage [email or web address]

## 2023-01-01 NOTE — Progress Notes (Signed)
Nutrition Follow-up  DOCUMENTATION CODES:   Non-severe (moderate) malnutrition in context of acute illness/injury  INTERVENTION:   Continue tube feeds via PEG: - Increase Osmolite 1.5 to 65 ml/hr (1560 ml/day) - PROSource TF20 60 ml BID - Free water flushes of 200 ml q 8 hours  Tube feeding regimen and current free water flushes provide 2500 kcal, 138 grams of protein, and 1789 ml of H2O.   - Continue Banatrol TF BID per tube, each packet provides 5 g of soluble fiber, to thicken stool consistency as pt still with rectal tube in place   - Continue Juven BID per tube, each packet provides 80 calories, 8 grams of carbohydrate, 2.5 grams of protein (collagen), 7 grams of L-arginine and 7 grams of L-glutamine; supplement contains CaHMB, Vitamins C, E, B12 and Zinc to promote wound healing  NUTRITION DIAGNOSIS:   Moderate Malnutrition related to acute illness as evidenced by mild fat depletion, moderate fat depletion, severe fat depletion, severe muscle depletion, percent weight loss.  Ongoing, being addressed via TF  GOAL:   Patient will meet greater than or equal to 90% of their needs  Met via increased TF  MONITOR:   Labs, Weight trends, TF tolerance, Skin, I & O's  REASON FOR ASSESSMENT:   Consult Enteral/tube feeding initiation and management  ASSESSMENT:   Pt admitted from home with the flu leading to acute respiratory failure with hypoxia and PEA arrest upon admission. PMH significant for HTN, HLD, chronic back pain, tobacco use.  Pt remains NPO with tube feeds infusing via PEG. No family present at bedside at time of RD visit. Noted pt on the DTP list for discharge planning.  Will increase tube feeding rate to meet pt's estimated needs given weight loss.  Pt with mild pitting generalized edema, non-pitting edema to BUE and BLE, and mild pitting sacral edema.  Admit weight: 64.9 kg Current weight: 60 kg  Current TF: Osmolite 1.5 @ 50 ml/hr, PROSource TF20 60 ml  BID, free water flushes 200 ml q 8 hours  Medications reviewed and include: pepcid, banatrol TF 60 ml BID, Juven BID, scopolamine patch  Labs reviewed. CBG's: 110-155 x 24 hours  UOP: 1100 ml x 24 hours  Diet Order:   Diet Order     None       EDUCATION NEEDS:   No education needs have been identified at this time  Skin:  Skin Assessment: Skin Integrity Issues: Other: crack to perineal area  Last BM:  12/31/22 type 6  Height:   Ht Readings from Last 1 Encounters:  10/11/22 5\' 4"  (1.626 m)    Weight:   Wt Readings from Last 1 Encounters:  12/30/22 60 kg    BMI:  Body mass index is 22.71 kg/m.  Estimated Nutritional Needs:   Kcal:  2500-2700 kcals  Protein:  125-150 grams  Fluid:  >/= 2 L/day    Gustavus Bryant, MS, RD, LDN Inpatient Clinical Dietitian Please see AMiON for contact information.

## 2023-01-01 NOTE — Progress Notes (Signed)
Physical Therapy Treatment Patient Details Name: Damon [name] MRN: [id number] DOB: Mar 07, 1961 Today's Date: 01/01/2023   History of Present Illness PT is a 62 yo male admitted to [facility] with flu on 10-12-2022. Pt coded Oct 13, 2023 and was intubated. Trach and PEG placed 11/01/22. MRI of brain revealed "Fairly symmetric diffusion-weighted and T2 FLAIR hyperintense  signal abnormality within the bilateral basal ganglia. Subtle signal  changes are also suspected within the bilateral hippocampi. Given  the provided history, these findings are compatible with acute  hypoxic/ischemic injury". PMH: HLD, HTN, back pain.    PT Comments    Patient with seeming increased awareness noting facial grimacing and potentially emotional crying intermittently during session today despite if moving or not.  Do not feel due to pain induced by therapy techniques, however, pt may have some emergent awareness of situation.  Remains difficult to assess cognition with no communication efforts.  Feel pt may continue to benefit from skilled PT though session today limited due to incontinent of bowel.  May attempt OOB or EOB next session for more work on head control. Remains most appropriate for long term care placement at d/c.    Recommendations for follow up therapy are one component of a multi-disciplinary discharge planning process, led by the attending physician.  Recommendations may be updated based on patient status, additional functional criteria and insurance authorization.  Follow Up Recommendations  Long-term institutional care without follow-up therapy Can patient physically be transported by private vehicle: No   Assistance Recommended at Discharge Frequent or constant Supervision/Assistance  Patient can return home with the following Two people to help with walking and/or transfers;Two people to help with bathing/dressing/bathroom;Assistance with cooking/housework;Direct supervision/assist for medications  management;Direct supervision/assist for financial management;Assistance with feeding;Assist for transportation;Help with stairs or ramp for entrance   Equipment Recommendations  Wheelchair (measurements PT);Wheelchair cushion (measurements PT);Hospital bed;Other (comment) (hoyer lift, air mattress)    Recommendations for Other Services       Precautions / Restrictions Precautions Precautions: Fall Precaution Comments: PEG (abdominal binder); Trach     Mobility  Bed Mobility Overal bed mobility: Needs Assistance Bed Mobility: Rolling Rolling: Total assist         General bed mobility comments: dependent for rolling in bed for hygiene due to incontinent of stool; assist for repositioning following hygiene    Transfers                        Ambulation/Gait                   Stairs             Wheelchair Mobility    Modified Rankin (Stroke Patients Only)       Balance                                            Cognition Arousal/Alertness: Awake/alert Behavior During Therapy: Flat affect Overall Cognitive Status: Impaired/Different from baseline                                 General Comments: several instances of grimacing and holding mouth open as if crying or laughing but not in response to movement or care necessarily; no command following noted today        Exercises Other Exercises Other  Exercises: PROM UE/LE in supine; stretch to R knee into extension with 30 sec holds x 4 with pressure into foot; available range to approx 30 degrees    General Comments General comments (skin integrity, edema, etc.): VSS during session on trach collar; assist for hygiene due to incontinent of BM; RN informed L wrist IV out. RT in for suctioning during session and pt coughing up some white sputum while rolled for hygiene as well      Pertinent Vitals/Pain Pain Assessment Breathing: occasional labored  breathing, short period of hyperventilation Negative Vocalization: none Facial Expression: facial grimacing Body Language: tense, distressed pacing, fidgeting Consolability: distracted or reassured by voice/touch PAINAD Score: 5 Pain Location: Grimacing at times, but not specifically during care or therapy techniques, R extensor tone with L LE ROM Pain Descriptors / Indicators: Grimacing Pain Intervention(s): Monitored during session, Repositioned, Utilized relaxation techniques    Home Living                          Prior Function            PT Goals (current goals can now be found in the care plan section) Acute Rehab PT Goals Patient Stated Goal: unable PT Goal Formulation: Patient unable to participate in goal setting Time For Goal Achievement: 01/15/23 Potential to Achieve Goals: Poor Additional Goals Additional Goal #1: Patient will demonstrate sustained attention on R side with mod to max multimodal cues in quiet environment. Progress towards PT goals: Progressing toward goals    Frequency    Min 1X/week      PT Plan Current plan remains appropriate    Co-evaluation              AM-PAC PT "6 Clicks" Mobility   Outcome Measure  Help needed turning from your back to your side while in a flat bed without using bedrails?: Total Help needed moving from lying on your back to sitting on the side of a flat bed without using bedrails?: Total Help needed moving to and from a bed to a chair (including a wheelchair)?: Total Help needed standing up from a chair using your arms (e.g., wheelchair or bedside chair)?: Total Help needed to walk in hospital room?: Total Help needed climbing 3-5 steps with a railing? : Total 6 Click Score: 6    End of Session Equipment Utilized During Treatment: Oxygen Activity Tolerance: Treatment limited secondary to medical complications (Comment) (stool incontinence) Patient left: in bed   PT Visit Diagnosis: Other  symptoms and signs involving the nervous system (R29.898);Other abnormalities of gait and mobility (R26.89)     Time: 1232-1300 PT Time Calculation (min) (ACUTE ONLY): 28 min  Charges:  $Therapeutic Exercise: 8-22 mins $Therapeutic Activity: 8-22 mins                     Magda Kiel, PT Acute Rehabilitation Services [phone] 01/01/2023    Reginia Naas 01/01/2023, 2:21 PM

## 2023-01-02 LAB — GLUCOSE, CAPILLARY
Glucose-Capillary: 130 mg/dL — ABNORMAL HIGH (ref 70–99)
Glucose-Capillary: 117 mg/dL — ABNORMAL HIGH (ref 70–99)
Glucose-Capillary: 117 mg/dL — ABNORMAL HIGH (ref 70–99)

## 2023-01-02 NOTE — Progress Notes (Signed)
[facility] PROGRESS NOTE    Progress Note  Damon [name]  [id number] DOB: August 18, 1961 DOA: 10/03/2022 PCP: [facility]     Brief Narrative:   Damon [name] is an 62 y.o. male past medical history of hypertension, hyperlipidemia tobacco abuse, chronic back pain comes into the [place] [facility] ED on 10/03/2022 for influenza A protracted hospitalization  Significant Events: 12/18 Flu A positive 12/20 Admitted, bipap, 5 minute code MRSA PCR - neg Urine strep _POS 12/21 Intubated, paralyzed, ARDS protocol, on 7cc/kg 12/22 Weaned to 6 cc/kg with dyssynchrony, back on 7 cc/kg, driving pressures good, weaning vent 12/23 Attempt to wean sedation again with dyssynchrony and desaturation.  Some concern for cuff leak, tube exchange.  Cuff did not appear to be blown.  Ongoing signs of cuff leak, query leaking around cough, possible large trachea, diuresing 12/25 tolerating PSV, myoclonus> ceribell, Neuro consult, changed to propofol, diuresed  12/26 Changed to DNR. On Propofol, versed.  Tolerating PSV.  MRI brain with hypoxic/anoxic injury Blood culture neg 12/27 GPD's on EEG, pending transfer to [facility] for cEEG NEURO CONSULT 12/28 moved to [facility] for LTM EEG 12/31 family updated by neuro: no chance for meaningful recovery, they should make plans for comfort measures 1/1 cultures - resp normal flora 1/2 eeg still with epileptogenicity. Palli fam mtg -- family does not want to hear from medical teams unless there is "good news"  1/3  cEEG with burst suppression w highly epileptiform bursts.  1/4 cEEG with burst suppression  + highly epileptiform bursts still.  WBC slightly up to 22.5. Temp high 99 low 100  c-EEG stopped duie to lack of benefit Pallative care consult - wife upset about early dc from ER on 12/18 Chaplain consult: Wife is sufering from anticipatory grief + accepting god's way + curious/anger about events leading upto current state EEG: generalized epileptogenicity with  high potential for seizures as well as profound diffuse encephalopathy. In the setting of cardiac arrest, this EEG pattern is suggestive of anoxic/hypoxic brain injury.  1/5 - Low grade fever +. [place] 22.5K.  on Zosyn. On vent fio2 30%. TF +. Per RN - diprivan stopped yesterday. No myoclonus today. Mild tachycardia + Rpt pall care on 10/21/22 per wife 1/6 - On vent FiO2 30%.  Tmax 99,51F ., WBC better. Still off diprivan -> No myoclonus. cXR visualized - devices in place and faiure clear Oral oxy and klonpin stopped 1/8 No significant changes in neuro status. Tachycardic to 130s, hypertensive to 140s. Coreg transitioned to metoprolol. 1/11 wife considering trach/peg, to discuss with surgery-discussed with Dr. Bobbye Morton 1/12 more tachycardic 1/16-spoke with wife at length at bedside, Dr. Hulen Skains also had a long conversation with spouse-desires to continue current aggressive care and desires PEG and trach placed 1/18 perc trach and peg 11/05/2022 transferred to floor due to need for ICU bed 11/08/2022 transition to [facility] with pulmonary following weekly for trach 2/4 recurrent fever, cultures collected. 2/5 changed to cuffless trach 2/18 antibiotics started for Pseudomonas on tracheal aspirate cultre 2/22 concern for myoclonic seizure, neuro reconsulted 2/26 transferred to ICU for secretions.  3/1-3/3: Respiratory status has improved.  Completed 10 days of Zosyn. Okay for transfer back to progressive floor.  Assessment/Plan:   Goals of care: IPAL from 11/07/2022, he has been DNR but transition back to full code. Remains full code persistent discussion with spouse by different providers.  Anoxic brain injury/myoclonic seizure activity: Neurology had a long discussion with the family on 10/14/2022 that she has sustained  irreversible anoxic brain injury that is catastrophic family was encouraged to move towards comfort measures. EEG on 11/16/2022 show myoclonic seizures. Patient is  currently on Keppra, Depakote and clonazepam. Patient has had no neurological improvement in the last 4 weeks. Talk to the wife over the weekend and today she has unrealistic expectations.  Acute hypoxic respiratory failure/ARDS due to influenza A and superimposed pneumococcal pneumonia: He completed course of Tamiflu, Rocephin then 7 days of Zosyn. 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  Pulmonary following on a weekly basis tracheostomy was exchanged on 12/13/2022. Has been experiencing significant secretions requiring suctioning. Patient was transferred back to the ICU due to to significant secretions, underwent chest physiotherapy.  Pseudomonas pneumonia versus tracheitis: Culture on 12/01/2022 Pseudomonas resistant to imipenem. She completed 10-day course of Zosyn on 12/16/2022.  He also received a few days of levofloxacin. Patient is an tobramycin nebulizer treatment.  PEA arrest: Continue cardiac monitoring.  Recurrent fever/leukocytosis: Has had intermittent fevers throughout her hospital stay she has received multiple courses of antibiotics. He has completed courses of antibiotics and antifungal last day on 12/18/2022.  Paroxysmal sympathetic hyperactivity/sinus tachycardia: Continue metoprolol 200 mg twice a day plus diltiazem low-dose. Continue IV metoprolol as needed for heart rate greater than 110. Heart rate is hard to control will have low threshold for IV metoprolol.  Continue Neurontin.  TSH unremarkable. Echocardiogram shows a low EF without significant valvular abnormalities.  Constipation: Laxatives were held due to frequent bowel movements.  Hypotension: Continue midodrine blood pressure stable.  Severe protein caloric malnutrition: Continue tube feedings, PEG tube placed on 11/01/2022.  Prediabetes mellitus: A1c of 5.9 not requiring sliding scale for several days continue CBGs check.  Hypovolemic hyponatremia: Resolved with increased free  water.  Diarrhea: Has resolved rectal tube in place will discontinue.  Sacral decubitus ulcer stage II RN Pressure Injury Documentation:     DVT prophylaxis: lovenox Family Communication:  Wife.  Status is: Inpatient Remains inpatient appropriate because: Devastating anoxic brain injury.    Code Status:     Code Status Orders  (From admission, onward)           Start     Ordered   11/07/22 1116  Full code  Continuous       Question:  By:  Answer:  Consent: discussion documented in EHR   11/07/22 1115           Code Status History     Date Active Date Inactive Code Status Order ID Comments User Context   10/09/2022 1851 11/07/2022 1115 DNR [id number]  Garner Nash, DO Inpatient   10/03/2022 0808 10/09/2022 1850 Full Code [id number]  Jonnie Finner, DO ED         IV Access:   Peripheral IV   Procedures and diagnostic studies:   No results found.   Medical Consultants:   None.   Subjective:    Damon [name] nonverbal  Objective:    Vitals:   01/01/23 2354 01/02/23 0305 01/02/23 0500 01/02/23 0548  BP:  (!) 89/69  107/79  Pulse:  94    Resp:  (!) 21    Temp:  98.8 F (37.1 C)    TempSrc:  Axillary    SpO2: 97% 94%    Weight:   62 kg   Height:       SpO2: 94 % O2 Flow Rate (L/min): 6 L/min FiO2 (%): 21 %   Intake/Output Summary (Last 24 hours) at 01/02/2023 [id number] Last data filed at  01/02/2023 0336 Gross per 24 hour  Intake --  Output 1200 ml  Net -1200 ml    Filed Weights   12/29/22 0500 12/30/22 0701 01/02/23 0500  Weight: 64 kg 60 kg 62 kg    Exam: General exam: In no acute distress. Respiratory system: Good air movement and clear to auscultation. Cardiovascular system: S1 & S2 heard, RRR. No JVD. Gastrointestinal system: Abdomen is nondistended, soft and nontender.  Extremities: No pedal edema. Skin: No rashes, lesions or ulcers Data Reviewed:    Labs: Basic Metabolic Panel: No results for input(s): "NA",  "K", "CL", "CO2", "GLUCOSE", "BUN", "CREATININE", "CALCIUM", "MG", "PHOS" in the last 168 hours.  GFR Estimated Creatinine Clearance: 82.2 mL/min (A) (by C-G formula based on SCr of 0.56 mg/dL (L)). Liver Function Tests: No results for input(s): "AST", "ALT", "ALKPHOS", "BILITOT", "PROT", "ALBUMIN" in the last 168 hours.  No results for input(s): "LIPASE", "AMYLASE" in the last 168 hours. No results for input(s): "AMMONIA" in the last 168 hours. Coagulation profile No results for input(s): "INR", "PROTIME" in the last 168 hours. COVID-19 Labs  No results for input(s): "DDIMER", "FERRITIN", "LDH", "CRP" in the last 72 hours.  Lab Results  Component Value Date   SARSCOV2NAA NEGATIVE 10/01/2022    CBC: No results for input(s): "WBC", "NEUTROABS", "HGB", "HCT", "MCV", "PLT" in the last 168 hours.  Cardiac Enzymes: No results for input(s): "CKTOTAL", "CKMB", "CKMBINDEX", "TROPONINI" in the last 168 hours. BNP (last 3 results) No results for input(s): "PROBNP" in the last 8760 hours. CBG: Recent Labs  Lab 12/31/22 0806 12/31/22 1800 01/01/23 0006 01/01/23 0803 01/01/23 2330  GLUCAP 110* 131* 148* 148* 130*    D-Dimer: No results for input(s): "DDIMER" in the last 72 hours. Hgb A1c: No results for input(s): "HGBA1C" in the last 72 hours. Lipid Profile: No results for input(s): "CHOL", "HDL", "LDLCALC", "TRIG", "CHOLHDL", "LDLDIRECT" in the last 72 hours. Thyroid function studies: No results for input(s): "TSH", "T4TOTAL", "T3FREE", "THYROIDAB" in the last 72 hours.  Invalid input(s): "FREET3"  Anemia work up: No results for input(s): "VITAMINB12", "FOLATE", "FERRITIN", "TIBC", "IRON", "RETICCTPCT" in the last 72 hours. Sepsis Labs: No results for input(s): "PROCALCITON", "WBC", "LATICACIDVEN" in the last 168 hours.  Microbiology No results found for this or any previous visit (from the past 240 hour(s)).    Medications:    Chlorhexidine Gluconate Cloth  6 each  Topical Daily   clonazepam  0.5 mg Per Tube BID   diltiazem  60 mg Per Tube Q6H   enoxaparin (LOVENOX) injection  40 mg Subcutaneous Q24H   famotidine  20 mg Per Tube QHS   feeding supplement (PROSource TF20)  60 mL Per Tube BID   fiber supplement (BANATROL TF)  60 mL Per Tube BID   free water  200 mL Per Tube Q8H   gabapentin  100 mg Per Tube Q8H   guaiFENesin  400 mg Per Tube Q8H   leptospermum manuka honey  1 Application Topical QHS   levETIRAcetam  1,500 mg Per Tube BID   metoprolol tartrate  200 mg Per Tube BID   midodrine  5 mg Per Tube Q8H   nutrition supplement (JUVEN)  1 packet Per Tube BID BM   mouth rinse  15 mL Mouth Rinse 4 times per day   scopolamine  1 patch Transdermal Q72H   valproic acid  500 mg Per Tube Q8H   Continuous Infusions:  sodium chloride 250 mL (12/13/22 0041)   feeding supplement (OSMOLITE 1.5 CAL)  65 mL/hr at 01/01/23 1152      LOS: 91 days   Damon [name]  [facility]  01/02/2023, 7:35 AM

## 2023-01-03 LAB — GLUCOSE, CAPILLARY
Glucose-Capillary: 107 mg/dL — ABNORMAL HIGH (ref 70–99)
Glucose-Capillary: 137 mg/dL — ABNORMAL HIGH (ref 70–99)
Glucose-Capillary: 144 mg/dL — ABNORMAL HIGH (ref 70–99)

## 2023-01-03 NOTE — Progress Notes (Signed)
[facility] PROGRESS NOTE    Progress Note  Clearance Christe  [id number] DOB: [birth year] DOA: 10/03/2022 PCP: [facility]     Brief Narrative:   Damon [name] is an 62 y.o. male past medical history of hypertension, hyperlipidemia tobacco abuse, chronic back pain comes into the [facility] ED on 10/03/2022 for influenza A protracted hospitalization  Significant Events: 12/18 Flu A positive 12/20 Admitted, bipap, 5 minute code MRSA PCR - neg Urine strep _POS 12/21 Intubated, paralyzed, ARDS protocol, on 7cc/kg 12/22 Weaned to 6 cc/kg with dyssynchrony, back on 7 cc/kg, driving pressures good, weaning vent 12/23 Attempt to wean sedation again with dyssynchrony and desaturation.  Some concern for cuff leak, tube exchange.  Cuff did not appear to be blown.  Ongoing signs of cuff leak, query leaking around cough, possible large trachea, diuresing 12/25 tolerating PSV, myoclonus> ceribell, Neuro consult, changed to propofol, diuresed  12/26 Changed to DNR. On Propofol, versed.  Tolerating PSV.  MRI brain with hypoxic/anoxic injury Blood culture neg 12/27 GPD's on EEG, pending transfer to [facility] for cEEG NEURO CONSULT 12/28 moved to [facility] for LTM EEG 12/31 family updated by neuro: no chance for meaningful recovery, they should make plans for comfort measures 1/1 cultures - resp normal flora 1/2 eeg still with epileptogenicity. Palli fam mtg -- family does not want to hear from medical teams unless there is "good news"  1/3  cEEG with burst suppression w highly epileptiform bursts.  1/4 cEEG with burst suppression  + highly epileptiform bursts still.  WBC slightly up to 22.5. Temp high 99 low 100  c-EEG stopped duie to lack of benefit Pallative care consult - wife upset about early dc from ER on 12/18 Chaplain consult: Wife is sufering from anticipatory grief + accepting god's way + curious/anger about events leading upto current state EEG: generalized epileptogenicity with  high potential for seizures as well as profound diffuse encephalopathy. In the setting of cardiac arrest, this EEG pattern is suggestive of anoxic/hypoxic brain injury.  1/5 - Low grade fever +. [place] 22.5K.  on Zosyn. On vent fio2 30%. TF +. Per RN - diprivan stopped yesterday. No myoclonus today. Mild tachycardia + Rpt pall care on 10/21/22 per wife 1/6 - On vent FiO2 30%.  Tmax 99,34F ., WBC better. Still off diprivan -> No myoclonus. cXR visualized - devices in place and faiure clear Oral oxy and klonpin stopped 1/8 No significant changes in neuro status. Tachycardic to 130s, hypertensive to 140s. Coreg transitioned to metoprolol. 1/11 wife considering trach/peg, to discuss with surgery-discussed with Dr. Bobbye Morton 1/12 more tachycardic 1/16-spoke with wife at length at bedside, Dr. Hulen Skains also had a long conversation with spouse-desires to continue current aggressive care and desires PEG and trach placed 1/18 perc trach and peg 11/05/2022 transferred to floor due to need for ICU bed 11/08/2022 transition to [facility] with pulmonary following weekly for trach 2/4 recurrent fever, cultures collected. 2/5 changed to cuffless trach 2/18 antibiotics started for Pseudomonas on tracheal aspirate cultre 2/22 concern for myoclonic seizure, neuro reconsulted 2/26 transferred to ICU for secretions.  3/1-3/3: Respiratory status has improved.  Completed 10 days of Zosyn. Okay for transfer back to progressive floor.  Assessment/Plan:   Goals of care: IPAL from 11/07/2022, he has been DNR but transition back to full code. Remains full code persistent discussion with spouse by different providers  have been unsuccessful.  Anoxic brain injury/myoclonic seizure activity: Neurology had a long discussion with the family on 10/14/2022  that she has sustained irreversible anoxic brain injury that is catastrophic family was encouraged to move towards comfort measures. EEG on 11/16/2022 show myoclonic  seizures. Patient is currently on Keppra, Depakote and clonazepam. Patient has had no neurological improvement in the last 4 weeks. Talk to the wife over the weekend and today she has unrealistic expectations.  Acute hypoxic respiratory failure/ARDS due to influenza A and superimposed pneumococcal pneumonia: He completed course of Tamiflu, Rocephin then 7 days of Zosyn. 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  Pulmonary following on a weekly basis tracheostomy was exchanged on 12/13/2022. Has been experiencing significant secretions requiring suctioning. Patient was transferred back to the ICU due to to significant secretions, underwent chest physiotherapy.  Pseudomonas pneumonia versus tracheitis: Culture on 12/01/2022 Pseudomonas resistant to imipenem. She completed 10-day course of Zosyn on 12/16/2022.  He also received a few days of levofloxacin. Patient is an tobramycin nebulizer treatment.  PEA arrest: Continue cardiac monitoring.  Recurrent fever/leukocytosis: Has had intermittent fevers throughout her hospital stay she has received multiple courses of antibiotics. He has completed courses of antibiotics and antifungal last day on 12/18/2022.  Paroxysmal sympathetic hyperactivity/sinus tachycardia: Continue metoprolol 200 mg twice a day plus diltiazem low-dose. Continue IV metoprolol as needed for heart rate greater than 110. Heart rate is hard to control will have low threshold for IV metoprolol.  Continue Neurontin.  TSH unremarkable. Echocardiogram shows a low EF without significant valvular abnormalities.  Constipation: Laxatives were held due to frequent bowel movements.  Hypotension: Continue midodrine blood pressure stable.  Severe protein caloric malnutrition: Continue tube feedings, PEG tube placed on 11/01/2022.  Prediabetes mellitus: A1c of 5.9 not requiring sliding scale for several days continue CBGs check.  Hypovolemic hyponatremia: Resolved with  increased free water.  Diarrhea: Has resolved rectal tube in place will discontinue.  Sacral decubitus ulcer stage II RN Pressure Injury Documentation:     DVT prophylaxis: lovenox Family Communication:  Wife.  Status is: Inpatient Remains inpatient appropriate because: Devastating anoxic brain injury.    Code Status:     Code Status Orders  (From admission, onward)           Start     Ordered   11/07/22 1116  Full code  Continuous       Question:  By:  Answer:  Consent: discussion documented in EHR   11/07/22 1115           Code Status History     Date Active Date Inactive Code Status Order ID Comments User Context   10/09/2022 1851 11/07/2022 1115 DNR [id number]  Garner Nash, DO Inpatient   10/03/2022 0808 10/09/2022 1850 Full Code [id number]  Jonnie Finner, DO ED         IV Access:   Peripheral IV   Procedures and diagnostic studies:   No results found.   Medical Consultants:   None.   Subjective:    Damon [name] nonverbal  Objective:    Vitals:   01/03/23 0257 01/03/23 0344 01/03/23 0500 01/03/23 0513  BP: 118/78 110/76  (!) 131/92  Pulse: 98 (!) 104    Resp: (!) 24 (!) 29    Temp: 99.2 F (37.3 C)     TempSrc: Axillary     SpO2: 96% 95%    Weight:   63 kg   Height:       SpO2: 95 % O2 Flow Rate (L/min): 6 L/min FiO2 (%): 21 %   Intake/Output Summary (Last 24 hours) at  01/03/2023 0658 Last data filed at 01/03/2023 0618 Gross per 24 hour  Intake 4263.67 ml  Output 2400 ml  Net 1863.67 ml    Filed Weights   12/30/22 0701 01/02/23 0500 01/03/23 0500  Weight: 60 kg 62 kg 63 kg    Exam: General exam: In no acute distress. Respiratory system: Good air movement and clear to auscultation. Cardiovascular system: S1 & S2 heard, RRR. No JVD. Gastrointestinal system: Abdomen is nondistended, soft and nontender.  Extremities: No pedal edema. Skin: No rashes, lesions or ulcers Data Reviewed:    Labs: Basic  Metabolic Panel: No results for input(s): "NA", "K", "CL", "CO2", "GLUCOSE", "BUN", "CREATININE", "CALCIUM", "MG", "PHOS" in the last 168 hours.  GFR Estimated Creatinine Clearance: 82.2 mL/min (A) (by C-G formula based on SCr of 0.56 mg/dL (L)). Liver Function Tests: No results for input(s): "AST", "ALT", "ALKPHOS", "BILITOT", "PROT", "ALBUMIN" in the last 168 hours.  No results for input(s): "LIPASE", "AMYLASE" in the last 168 hours. No results for input(s): "AMMONIA" in the last 168 hours. Coagulation profile No results for input(s): "INR", "PROTIME" in the last 168 hours. COVID-19 Labs  No results for input(s): "DDIMER", "FERRITIN", "LDH", "CRP" in the last 72 hours.  Lab Results  Component Value Date   SARSCOV2NAA NEGATIVE 10/01/2022    CBC: No results for input(s): "WBC", "NEUTROABS", "HGB", "HCT", "MCV", "PLT" in the last 168 hours.  Cardiac Enzymes: No results for input(s): "CKTOTAL", "CKMB", "CKMBINDEX", "TROPONINI" in the last 168 hours. BNP (last 3 results) No results for input(s): "PROBNP" in the last 8760 hours. CBG: Recent Labs  Lab 01/01/23 0803 01/01/23 2330 01/02/23 0750 01/02/23 1519 01/02/23 2304  GLUCAP 148* 130* 130* 117* 117*    D-Dimer: No results for input(s): "DDIMER" in the last 72 hours. Hgb A1c: No results for input(s): "HGBA1C" in the last 72 hours. Lipid Profile: No results for input(s): "CHOL", "HDL", "LDLCALC", "TRIG", "CHOLHDL", "LDLDIRECT" in the last 72 hours. Thyroid function studies: No results for input(s): "TSH", "T4TOTAL", "T3FREE", "THYROIDAB" in the last 72 hours.  Invalid input(s): "FREET3"  Anemia work up: No results for input(s): "VITAMINB12", "FOLATE", "FERRITIN", "TIBC", "IRON", "RETICCTPCT" in the last 72 hours. Sepsis Labs: No results for input(s): "PROCALCITON", "WBC", "LATICACIDVEN" in the last 168 hours.  Microbiology No results found for this or any previous visit (from the past 240  hour(s)).    Medications:    Chlorhexidine Gluconate Cloth  6 each Topical Daily   clonazepam  0.5 mg Per Tube BID   diltiazem  60 mg Per Tube Q6H   enoxaparin (LOVENOX) injection  40 mg Subcutaneous Q24H   famotidine  20 mg Per Tube QHS   feeding supplement (PROSource TF20)  60 mL Per Tube BID   fiber supplement (BANATROL TF)  60 mL Per Tube BID   free water  200 mL Per Tube Q8H   gabapentin  100 mg Per Tube Q8H   guaiFENesin  400 mg Per Tube Q8H   leptospermum manuka honey  1 Application Topical QHS   levETIRAcetam  1,500 mg Per Tube BID   metoprolol tartrate  200 mg Per Tube BID   midodrine  5 mg Per Tube Q8H   nutrition supplement (JUVEN)  1 packet Per Tube BID BM   mouth rinse  15 mL Mouth Rinse 4 times per day   scopolamine  1 patch Transdermal Q72H   valproic acid  500 mg Per Tube Q8H   Continuous Infusions:  sodium chloride 250 mL (12/13/22 0041)  feeding supplement (OSMOLITE 1.5 CAL) 1,000 mL (01/03/23 0446)      LOS: 92 days   Charlynne Cousins  [facility]  01/03/2023, 6:58 AM

## 2023-01-04 LAB — GLUCOSE, CAPILLARY
Glucose-Capillary: 102 mg/dL — ABNORMAL HIGH (ref 70–99)
Glucose-Capillary: 126 mg/dL — ABNORMAL HIGH (ref 70–99)
Glucose-Capillary: 131 mg/dL — ABNORMAL HIGH (ref 70–99)

## 2023-01-04 NOTE — Progress Notes (Signed)
[facility] PROGRESS NOTE    Progress Note  Damon [name]  [id number] DOB: [birth year] DOA: 10/03/2022 PCP: [facility]     Brief Narrative:   Damon [name] is an 62 y.o. male past medical history of hypertension, hyperlipidemia tobacco abuse, chronic back pain comes into the [facility] ED on 10/03/2022 for influenza A protracted hospitalization  Significant Events: 12/18 Flu A positive 12/20 Admitted, bipap, 5 minute code MRSA PCR - neg Urine strep _POS 12/21 Intubated, paralyzed, ARDS protocol, on 7cc/kg 12/22 Weaned to 6 cc/kg with dyssynchrony, back on 7 cc/kg, driving pressures good, weaning vent 12/23 Attempt to wean sedation again with dyssynchrony and desaturation.  Some concern for cuff leak, tube exchange.  Cuff did not appear to be blown.  Ongoing signs of cuff leak, query leaking around cough, possible large trachea, diuresing 12/25 tolerating PSV, myoclonus> ceribell, Neuro consult, changed to propofol, diuresed  12/26 Changed to DNR. On Propofol, versed.  Tolerating PSV.  MRI brain with hypoxic/anoxic injury Blood culture neg 12/27 GPD's on EEG, pending transfer to [facility] for cEEG NEURO CONSULT 12/28 moved to [facility] for LTM EEG 12/31 family updated by neuro: no chance for meaningful recovery, they should make plans for comfort measures 1/1 cultures - resp normal flora 1/2 eeg still with epileptogenicity. Palli fam mtg -- family does not want to hear from medical teams unless there is "good news"  1/3  cEEG with burst suppression w highly epileptiform bursts.  1/4 cEEG with burst suppression  + highly epileptiform bursts still.  WBC slightly up to 22.5. Temp high 99 low 100  c-EEG stopped duie to lack of benefit Pallative care consult - wife upset about early dc from ER on 12/18 Chaplain consult: Wife is sufering from anticipatory grief + accepting god's way + curious/anger about events leading upto current state EEG: generalized epileptogenicity with  high potential for seizures as well as profound diffuse encephalopathy. In the setting of cardiac arrest, this EEG pattern is suggestive of anoxic/hypoxic brain injury.  1/5 - Low grade fever +. [place] 22.5K.  on Zosyn. On vent fio2 30%. TF +. Per RN - diprivan stopped yesterday. No myoclonus today. Mild tachycardia + Rpt pall care on 10/21/22 per wife 1/6 - On vent FiO2 30%.  Tmax 99,17F ., WBC better. Still off diprivan -> No myoclonus. cXR visualized - devices in place and faiure clear Oral oxy and klonpin stopped 1/8 No significant changes in neuro status. Tachycardic to 130s, hypertensive to 140s. Coreg transitioned to metoprolol. 1/11 wife considering trach/peg, to discuss with surgery-discussed with Dr. Bobbye Morton 1/12 more tachycardic 1/16-spoke with wife at length at bedside, Dr. Hulen Skains also had a long conversation with spouse-desires to continue current aggressive care and desires PEG and trach placed 1/18 perc trach and peg 11/05/2022 transferred to floor due to need for ICU bed 11/08/2022 transition to [facility] with pulmonary following weekly for trach 2/4 recurrent fever, cultures collected. 2/5 changed to cuffless trach 2/18 antibiotics started for Pseudomonas on tracheal aspirate cultre 2/22 concern for myoclonic seizure, neuro reconsulted 2/26 transferred to ICU for secretions.  3/1-3/3: Respiratory status has improved.  Completed 10 days of Zosyn. Okay for transfer back to progressive floor.  Assessment/Plan:   Goals of care: IPAL from 11/07/2022, he has been DNR but transition back to full code. Remains full code persistent discussion with spouse by different providers  have been unsuccessful. No events awaiting placement.  Anoxic brain injury/myoclonic seizure activity: Neurology had a long discussion with  the family on 10/14/2022 that she has sustained irreversible anoxic brain injury that is catastrophic family was encouraged to move towards comfort measures. EEG  on 11/16/2022 show myoclonic seizures. Patient is currently on Keppra, Depakote and clonazepam. Patient has had no neurological improvement in the last 4 weeks. Talk to the wife over the weekend and today she has unrealistic expectations.  Acute hypoxic respiratory failure/ARDS due to influenza A and superimposed pneumococcal pneumonia: He completed course of Tamiflu, Rocephin then 7 days of Zosyn. 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  Pulmonary following on a weekly basis tracheostomy was exchanged on 12/13/2022. Has been experiencing significant secretions requiring suctioning. Patient was transferred back to the ICU due to to significant secretions, underwent chest physiotherapy.  Pseudomonas pneumonia versus tracheitis: Culture on 12/01/2022 Pseudomonas resistant to imipenem. She completed 10-day course of Zosyn on 12/16/2022.  He also received a few days of levofloxacin. Patient is an tobramycin nebulizer treatment.  PEA arrest: Continue cardiac monitoring.  Recurrent fever/leukocytosis: Has had intermittent fevers throughout her hospital stay she has received multiple courses of antibiotics. He has completed courses of antibiotics and antifungal last day on 12/18/2022.  Paroxysmal sympathetic hyperactivity/sinus tachycardia: Continue metoprolol 200 mg twice a day plus diltiazem low-dose. Continue IV metoprolol as needed for heart rate greater than 110. Heart rate is hard to control will have low threshold for IV metoprolol.  Continue Neurontin.  TSH unremarkable. Echocardiogram shows a low EF without significant valvular abnormalities.  Constipation: Laxatives were held due to frequent bowel movements.  Hypotension: Continue midodrine blood pressure stable.  Severe protein caloric malnutrition: Continue tube feedings, PEG tube placed on 11/01/2022.  Prediabetes mellitus: A1c of 5.9 not requiring sliding scale for several days continue CBGs check.  Hypovolemic  hyponatremia: Resolved with increased free water.  Diarrhea: Has resolved rectal tube in place will discontinue.  Sacral decubitus ulcer stage II RN Pressure Injury Documentation:     DVT prophylaxis: lovenox Family Communication:  Wife.  Status is: Inpatient Remains inpatient appropriate because: Devastating anoxic brain injury.    Code Status:     Code Status Orders  (From admission, onward)           Start     Ordered   11/07/22 1116  Full code  Continuous       Question:  By:  Answer:  Consent: discussion documented in EHR   11/07/22 1115           Code Status History     Date Active Date Inactive Code Status Order ID Comments User Context   10/09/2022 1851 11/07/2022 1115 DNR [id number]  Garner Nash, DO Inpatient   10/03/2022 0808 10/09/2022 1850 Full Code [id number]  Jonnie Finner, DO ED         IV Access:   Peripheral IV   Procedures and diagnostic studies:   No results found.   Medical Consultants:   None.   Subjective:    Damon [name] nonverbal  Objective:    Vitals:   01/04/23 0500 01/04/23 0555 01/04/23 0736 01/04/23 0816  BP:  104/68 109/71 95/74  Pulse:   (!) 108 (!) 102  Resp:   (!) 23 17  Temp:   98.5 F (36.9 C)   TempSrc:   Oral   SpO2:   95% 97%  Weight: 63.1 kg     Height:       SpO2: 97 % O2 Flow Rate (L/min): 6 L/min FiO2 (%): 21 %   Intake/Output Summary (Last  24 hours) at 01/04/2023 0858 Last data filed at 01/04/2023 0737 Gross per 24 hour  Intake 2351.75 ml  Output 1700 ml  Net 651.75 ml    Filed Weights   01/02/23 0500 01/03/23 0500 01/04/23 0500  Weight: 62 kg 63 kg 63.1 kg    Exam: General exam: In no acute distress. Respiratory system: Good air movement and clear to auscultation. Cardiovascular system: S1 & S2 heard, RRR. No JVD. Gastrointestinal system: Abdomen is nondistended, soft and nontender.  Extremities: No pedal edema. Skin: No rashes, lesions or ulcers Data Reviewed:     Labs: Basic Metabolic Panel: No results for input(s): "NA", "K", "CL", "CO2", "GLUCOSE", "BUN", "CREATININE", "CALCIUM", "MG", "PHOS" in the last 168 hours.  GFR Estimated Creatinine Clearance: 82.2 mL/min (A) (by C-G formula based on SCr of 0.56 mg/dL (L)). Liver Function Tests: No results for input(s): "AST", "ALT", "ALKPHOS", "BILITOT", "PROT", "ALBUMIN" in the last 168 hours.  No results for input(s): "LIPASE", "AMYLASE" in the last 168 hours. No results for input(s): "AMMONIA" in the last 168 hours. Coagulation profile No results for input(s): "INR", "PROTIME" in the last 168 hours. COVID-19 Labs  No results for input(s): "DDIMER", "FERRITIN", "LDH", "CRP" in the last 72 hours.  Lab Results  Component Value Date   SARSCOV2NAA NEGATIVE 10/01/2022    CBC: No results for input(s): "WBC", "NEUTROABS", "HGB", "HCT", "MCV", "PLT" in the last 168 hours.  Cardiac Enzymes: No results for input(s): "CKTOTAL", "CKMB", "CKMBINDEX", "TROPONINI" in the last 168 hours. BNP (last 3 results) No results for input(s): "PROBNP" in the last 8760 hours. CBG: Recent Labs  Lab 01/02/23 2304 01/03/23 0806 01/03/23 1518 01/03/23 2347 01/04/23 0755  GLUCAP 117* 137* 144* 107* 102*    D-Dimer: No results for input(s): "DDIMER" in the last 72 hours. Hgb A1c: No results for input(s): "HGBA1C" in the last 72 hours. Lipid Profile: No results for input(s): "CHOL", "HDL", "LDLCALC", "TRIG", "CHOLHDL", "LDLDIRECT" in the last 72 hours. Thyroid function studies: No results for input(s): "TSH", "T4TOTAL", "T3FREE", "THYROIDAB" in the last 72 hours.  Invalid input(s): "FREET3"  Anemia work up: No results for input(s): "VITAMINB12", "FOLATE", "FERRITIN", "TIBC", "IRON", "RETICCTPCT" in the last 72 hours. Sepsis Labs: No results for input(s): "PROCALCITON", "WBC", "LATICACIDVEN" in the last 168 hours.  Microbiology No results found for this or any previous visit (from the past 240  hour(s)).    Medications:    Chlorhexidine Gluconate Cloth  6 each Topical Daily   clonazepam  0.5 mg Per Tube BID   diltiazem  60 mg Per Tube Q6H   enoxaparin (LOVENOX) injection  40 mg Subcutaneous Q24H   famotidine  20 mg Per Tube QHS   feeding supplement (PROSource TF20)  60 mL Per Tube BID   fiber supplement (BANATROL TF)  60 mL Per Tube BID   free water  200 mL Per Tube Q8H   gabapentin  100 mg Per Tube Q8H   guaiFENesin  400 mg Per Tube Q8H   leptospermum manuka honey  1 Application Topical QHS   levETIRAcetam  1,500 mg Per Tube BID   metoprolol tartrate  200 mg Per Tube BID   midodrine  5 mg Per Tube Q8H   nutrition supplement (JUVEN)  1 packet Per Tube BID BM   mouth rinse  15 mL Mouth Rinse 4 times per day   scopolamine  1 patch Transdermal Q72H   valproic acid  500 mg Per Tube Q8H   Continuous Infusions:  sodium chloride 250  mL (12/13/22 0041)   feeding supplement (OSMOLITE 1.5 CAL) 65 mL/hr at 01/04/23 0737      LOS: 93 days   Charlynne Cousins  [facility]  01/04/2023, 8:58 AM

## 2023-01-04 NOTE — Progress Notes (Addendum)
CSW spoke with Loree Fee at [facility] who states she will review patient's clinicals to determine if a bed offer can be made.  CSW spoke with patient's wife [name] to introduce self and role. CSW explained that efforts will be made in attempt to obtain bed offers from facilities that can accommodate his needs. [name] adamant that she must complete a tour of the facility and approve before accepting any bed offers.[name] questioned CSW stating "where were you when I was trying to get my husband transferred to [facility] states "he is not going to a nursing home that I don't approve of and y'all won't kick him out neither!"   CSW will update patient's wife as updates become available.  Madilyn Fireman, MSW, LCSW Transitions of Care  Clinical Social Worker II [phone]

## 2023-01-05 LAB — GLUCOSE, CAPILLARY
Glucose-Capillary: 114 mg/dL — ABNORMAL HIGH (ref 70–99)
Glucose-Capillary: 108 mg/dL — ABNORMAL HIGH (ref 70–99)
Glucose-Capillary: 134 mg/dL — ABNORMAL HIGH (ref 70–99)

## 2023-01-05 NOTE — Progress Notes (Signed)
[facility] PROGRESS NOTE    Progress Note  Damon [name]  [id number] DOB: September 09, 1961 DOA: 10/03/2022 PCP: [facility]     Brief Narrative:   Damon [name] is an 63 y.o. male past medical history of hypertension, hyperlipidemia tobacco abuse, chronic back pain comes into the [place] long ED on 10/03/2022 for influenza A protracted hospitalization  Significant Events: 12/18 Flu A positive 12/20 Admitted, bipap, 5 minute code MRSA PCR - neg Urine strep _POS 12/21 Intubated, paralyzed, ARDS protocol, on 7cc/kg 12/22 Weaned to 6 cc/kg with dyssynchrony, back on 7 cc/kg, driving pressures good, weaning vent 12/23 Attempt to wean sedation again with dyssynchrony and desaturation.  Some concern for cuff leak, tube exchange.  Cuff did not appear to be blown.  Ongoing signs of cuff leak, query leaking around cough, possible large trachea, diuresing 12/25 tolerating PSV, myoclonus> ceribell, Neuro consult, changed to propofol, diuresed  12/26 Changed to DNR. On Propofol, versed.  Tolerating PSV.  MRI brain with hypoxic/anoxic injury Blood culture neg 12/27 GPD's on EEG, pending transfer to [facility] for cEEG NEURO CONSULT 12/28 moved to [facility] for LTM EEG 12/31 family updated by neuro: no chance for meaningful recovery, they should make plans for comfort measures 1/1 cultures - resp normal flora 1/2 eeg still with epileptogenicity. Palli fam mtg -- family does not want to hear from medical teams unless there is "good news"  1/3  cEEG with burst suppression w highly epileptiform bursts.  1/4 cEEG with burst suppression  + highly epileptiform bursts still.  WBC slightly up to 22.5. Temp high 99 low 100  c-EEG stopped duie to lack of benefit Pallative care consult - wife upset about early dc from ER on 12/18 Chaplain consult: Wife is sufering from anticipatory grief + accepting god's way + curious/anger about events leading upto current state EEG: generalized epileptogenicity with  high potential for seizures as well as profound diffuse encephalopathy. In the setting of cardiac arrest, this EEG pattern is suggestive of anoxic/hypoxic brain injury.  1/5 - Low grade fever +. [place] 22.5K.  on Zosyn. On vent fio2 30%. TF +. Per RN - diprivan stopped yesterday. No myoclonus today. Mild tachycardia + Rpt pall care on 10/21/22 per wife 1/6 - On vent FiO2 30%.  Tmax 99,61F ., WBC better. Still off diprivan -> No myoclonus. cXR visualized - devices in place and faiure clear Oral oxy and klonpin stopped 1/8 No significant changes in neuro status. Tachycardic to 130s, hypertensive to 140s. Coreg transitioned to metoprolol. 1/11 wife considering trach/peg, to discuss with surgery-discussed with Dr. Bobbye Morton 1/12 more tachycardic 1/16-spoke with wife at length at bedside, Dr. Hulen Skains also had a long conversation with spouse-desires to continue current aggressive care and desires PEG and trach placed 1/18 perc trach and peg 11/05/2022 transferred to floor due to need for ICU bed 11/08/2022 transition to [facility] with pulmonary following weekly for trach 2/4 recurrent fever, cultures collected. 2/5 changed to cuffless trach 2/18 antibiotics started for Pseudomonas on tracheal aspirate cultre 2/22 concern for myoclonic seizure, neuro reconsulted 2/26 transferred to ICU for secretions.  3/1-3/3: Respiratory status has improved.  Completed 10 days of Zosyn. Okay for transfer back to progressive floor.  Assessment/Plan:   Goals of care: IPAL from 11/07/2022, he has been DNR but transition back to full code. Remains full code persistent discussion with spouse by different providers  have been unsuccessful. No events awaiting placement.  Anoxic brain injury/myoclonic seizure activity: Neurology had a long discussion with  the family on 10/14/2022 that she has sustained irreversible anoxic brain injury that is catastrophic family was encouraged to move towards comfort measures. EEG  on 11/16/2022 show myoclonic seizures. Patient is currently on Keppra, Depakote and clonazepam. Patient has had no neurological improvement in the last 4 weeks. Talk to the wife over the weekend and today she has unrealistic expectations.  Acute hypoxic respiratory failure/ARDS due to influenza A and superimposed pneumococcal pneumonia: He completed course of Tamiflu, Rocephin then 7 days of Zosyn. 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  Pulmonary following on a weekly basis tracheostomy was exchanged on 12/13/2022. Has been experiencing significant secretions requiring suctioning. Patient was transferred back to the ICU due to to significant secretions, underwent chest physiotherapy.  Pseudomonas pneumonia versus tracheitis: Culture on 12/01/2022 Pseudomonas resistant to imipenem. She completed 10-day course of Zosyn on 12/16/2022.  He also received a few days of levofloxacin. Patient is an tobramycin nebulizer treatment.  PEA arrest: Continue cardiac monitoring.  Recurrent fever/leukocytosis: Has had intermittent fevers throughout her hospital stay she has received multiple courses of antibiotics. He has completed courses of antibiotics and antifungal last day on 12/18/2022.  Paroxysmal sympathetic hyperactivity/sinus tachycardia: Continue metoprolol 200 mg twice a day plus diltiazem low-dose. Continue IV metoprolol as needed for heart rate greater than 110. Heart rate is hard to control will have low threshold for IV metoprolol.  Continue Neurontin.  TSH unremarkable. Echocardiogram shows a low EF without significant valvular abnormalities.  Constipation: Laxatives were held due to frequent bowel movements.  Hypotension: Continue midodrine blood pressure stable.  Severe protein caloric malnutrition: Continue tube feedings, PEG tube placed on 11/01/2022.  Prediabetes mellitus: A1c of 5.9 not requiring sliding scale for several days continue CBGs check.  Hypovolemic  hyponatremia: Resolved with increased free water.  Diarrhea: Has resolved rectal tube in place will discontinue.  Sacral decubitus ulcer stage II RN Pressure Injury Documentation:     DVT prophylaxis: lovenox Family Communication:  Wife.  Status is: Inpatient Remains inpatient appropriate because: Devastating anoxic brain injury.    Code Status:     Code Status Orders  (From admission, onward)           Start     Ordered   11/07/22 1116  Full code  Continuous       Question:  By:  Answer:  Consent: discussion documented in EHR   11/07/22 1115           Code Status History     Date Active Date Inactive Code Status Order ID Comments User Context   10/09/2022 1851 11/07/2022 1115 DNR [id number]  Garner Nash, DO Inpatient   10/03/2022 0808 10/09/2022 1850 Full Code [id number]  Jonnie Finner, DO ED         IV Access:   Peripheral IV   Procedures and diagnostic studies:   No results found.   Medical Consultants:   None.   Subjective:    Damon [name] nonverbal  Objective:    Vitals:   01/05/23 0328 01/05/23 0535 01/05/23 0824 01/05/23 0847  BP: 100/72 101/77 91/63   Pulse: (!) 102  (!) 159 94  Resp: 20  16 16   Temp: 99.6 F (37.6 C)  98.7 F (37.1 C)   TempSrc: Axillary  Axillary   SpO2: 96%  100% 100%  Weight:      Height:       SpO2: 100 % O2 Flow Rate (L/min): 6 L/min FiO2 (%): 21 %   Intake/Output Summary (  Last 24 hours) at 01/05/2023 0903 Last data filed at 01/05/2023 0748 Gross per 24 hour  Intake 2571.91 ml  Output 2400 ml  Net 171.91 ml    Filed Weights   01/02/23 0500 01/03/23 0500 01/04/23 0500  Weight: 62 kg 63 kg 63.1 kg    Exam: General exam: In no acute distress. Respiratory system: Good air movement and clear to auscultation. Cardiovascular system: S1 & S2 heard, RRR. No JVD. Gastrointestinal system: Abdomen is nondistended, soft and nontender.  Extremities: No pedal edema. Skin: No rashes, lesions  or ulcers Data Reviewed:    Labs: Basic Metabolic Panel: No results for input(s): "NA", "K", "CL", "CO2", "GLUCOSE", "BUN", "CREATININE", "CALCIUM", "MG", "PHOS" in the last 168 hours.  GFR Estimated Creatinine Clearance: 82.2 mL/min (A) (by C-G formula based on SCr of 0.56 mg/dL (L)). Liver Function Tests: No results for input(s): "AST", "ALT", "ALKPHOS", "BILITOT", "PROT", "ALBUMIN" in the last 168 hours.  No results for input(s): "LIPASE", "AMYLASE" in the last 168 hours. No results for input(s): "AMMONIA" in the last 168 hours. Coagulation profile No results for input(s): "INR", "PROTIME" in the last 168 hours. COVID-19 Labs  No results for input(s): "DDIMER", "FERRITIN", "LDH", "CRP" in the last 72 hours.  Lab Results  Component Value Date   SARSCOV2NAA NEGATIVE 10/01/2022    CBC: No results for input(s): "WBC", "NEUTROABS", "HGB", "HCT", "MCV", "PLT" in the last 168 hours.  Cardiac Enzymes: No results for input(s): "CKTOTAL", "CKMB", "CKMBINDEX", "TROPONINI" in the last 168 hours. BNP (last 3 results) No results for input(s): "PROBNP" in the last 8760 hours. CBG: Recent Labs  Lab 01/03/23 2347 01/04/23 0755 01/04/23 1538 01/04/23 2330 01/05/23 0829  GLUCAP 107* 102* 126* 131* 114*    D-Dimer: No results for input(s): "DDIMER" in the last 72 hours. Hgb A1c: No results for input(s): "HGBA1C" in the last 72 hours. Lipid Profile: No results for input(s): "CHOL", "HDL", "LDLCALC", "TRIG", "CHOLHDL", "LDLDIRECT" in the last 72 hours. Thyroid function studies: No results for input(s): "TSH", "T4TOTAL", "T3FREE", "THYROIDAB" in the last 72 hours.  Invalid input(s): "FREET3"  Anemia work up: No results for input(s): "VITAMINB12", "FOLATE", "FERRITIN", "TIBC", "IRON", "RETICCTPCT" in the last 72 hours. Sepsis Labs: No results for input(s): "PROCALCITON", "WBC", "LATICACIDVEN" in the last 168 hours.  Microbiology No results found for this or any previous visit  (from the past 240 hour(s)).    Medications:    Chlorhexidine Gluconate Cloth  6 each Topical Daily   clonazepam  0.5 mg Per Tube BID   diltiazem  60 mg Per Tube Q6H   enoxaparin (LOVENOX) injection  40 mg Subcutaneous Q24H   famotidine  20 mg Per Tube QHS   feeding supplement (PROSource TF20)  60 mL Per Tube BID   fiber supplement (BANATROL TF)  60 mL Per Tube BID   free water  200 mL Per Tube Q8H   gabapentin  100 mg Per Tube Q8H   guaiFENesin  400 mg Per Tube Q8H   leptospermum manuka honey  1 Application Topical QHS   levETIRAcetam  1,500 mg Per Tube BID   metoprolol tartrate  200 mg Per Tube BID   midodrine  5 mg Per Tube Q8H   nutrition supplement (JUVEN)  1 packet Per Tube BID BM   mouth rinse  15 mL Mouth Rinse 4 times per day   scopolamine  1 patch Transdermal Q72H   valproic acid  500 mg Per Tube Q8H   Continuous Infusions:  sodium chloride  250 mL (12/13/22 0041)   feeding supplement (OSMOLITE 1.5 CAL) 65 mL/hr at 01/05/23 0748      LOS: 38 days   Charlynne Cousins  [facility]  01/05/2023, 9:03 AM

## 2023-01-06 LAB — GLUCOSE, CAPILLARY
Glucose-Capillary: 118 mg/dL — ABNORMAL HIGH (ref 70–99)
Glucose-Capillary: 114 mg/dL — ABNORMAL HIGH (ref 70–99)

## 2023-01-06 MED ORDER — CLONAZEPAM 0.5 MG PO TABS
0.5000 mg | ORAL_TABLET | Freq: Two times a day (BID) | ORAL | Status: DC
  Administered 2023-01-06 – 2023-01-22 (×32): 0.5 mg
  Filled 2023-01-06 (×32): qty 1

## 2023-01-06 NOTE — Progress Notes (Signed)
[facility] PROGRESS NOTE    Progress Note  Damon [name]  [id number] DOB: 09-04-1961 DOA: 10/03/2022 PCP: [facility]     Brief Narrative:   Damon [name] is an 62 y.o. male past medical history of hypertension, hyperlipidemia tobacco abuse, chronic back pain comes into the [place] long ED on 10/03/2022 for influenza A protracted hospitalization  Significant Events: 12/18 Flu A positive 12/20 Admitted, bipap, 5 minute code MRSA PCR - neg Urine strep _POS 12/21 Intubated, paralyzed, ARDS protocol, on 7cc/kg 12/22 Weaned to 6 cc/kg with dyssynchrony, back on 7 cc/kg, driving pressures good, weaning vent 12/23 Attempt to wean sedation again with dyssynchrony and desaturation.  Some concern for cuff leak, tube exchange.  Cuff did not appear to be blown.  Ongoing signs of cuff leak, query leaking around cough, possible large trachea, diuresing 12/25 tolerating PSV, myoclonus> ceribell, Neuro consult, changed to propofol, diuresed  12/26 Changed to DNR. On Propofol, versed.  Tolerating PSV.  MRI brain with hypoxic/anoxic injury Blood culture neg 12/27 GPD's on EEG, pending transfer to [facility] for cEEG NEURO CONSULT 12/28 moved to [facility] for LTM EEG 12/31 family updated by neuro: no chance for meaningful recovery, they should make plans for comfort measures 1/1 cultures - resp normal flora 1/2 eeg still with epileptogenicity. Palli fam mtg -- family does not want to hear from medical teams unless there is "good news"  1/3  cEEG with burst suppression w highly epileptiform bursts.  1/4 cEEG with burst suppression  + highly epileptiform bursts still.  WBC slightly up to 22.5. Temp high 99 low 100  c-EEG stopped duie to lack of benefit Pallative care consult - wife upset about early dc from ER on 12/18 Chaplain consult: Wife is sufering from anticipatory grief + accepting god's way + curious/anger about events leading upto current state EEG: generalized epileptogenicity with  high potential for seizures as well as profound diffuse encephalopathy. In the setting of cardiac arrest, this EEG pattern is suggestive of anoxic/hypoxic brain injury.  1/5 - Low grade fever +. [place] 22.5K.  on Zosyn. On vent fio2 30%. TF +. Per RN - diprivan stopped yesterday. No myoclonus today. Mild tachycardia + Rpt pall care on 10/21/22 per wife 1/6 - On vent FiO2 30%.  Tmax 99,74F ., WBC better. Still off diprivan -> No myoclonus. cXR visualized - devices in place and faiure clear Oral oxy and klonpin stopped 1/8 No significant changes in neuro status. Tachycardic to 130s, hypertensive to 140s. Coreg transitioned to metoprolol. 1/11 wife considering trach/peg, to discuss with surgery-discussed with Dr. Bobbye Morton 1/12 more tachycardic 1/16-spoke with wife at length at bedside, Dr. Hulen Skains also had a long conversation with spouse-desires to continue current aggressive care and desires PEG and trach placed 1/18 perc trach and peg 11/05/2022 transferred to floor due to need for ICU bed 11/08/2022 transition to [facility] with pulmonary following weekly for trach 2/4 recurrent fever, cultures collected. 2/5 changed to cuffless trach 2/18 antibiotics started for Pseudomonas on tracheal aspirate cultre 2/22 concern for myoclonic seizure, neuro reconsulted 2/26 transferred to ICU for secretions.  3/1-3/3: Respiratory status has improved.  Completed 10 days of Zosyn. Okay for transfer back to progressive floor.  Assessment/Plan:   Goals of care: IPAL from 11/07/2022, he has been DNR but transition back to full code. Remains full code persistent discussion with spouse by different providers  have been unsuccessful. While continues to be in different she wants full press aggressive care.  She has unrealistic  expectations No events awaiting placement.  Anoxic brain injury/myoclonic seizure activity: Neurology had a long discussion with the family on 10/14/2022 that she has sustained  irreversible anoxic brain injury that is catastrophic family was encouraged to move towards comfort measures. EEG on 11/16/2022 show myoclonic seizures. Patient is currently on Keppra, Depakote and clonazepam. Patient has had no neurological improvement in the last 4 weeks. Talk to the wife over the weekend and today she has unrealistic expectations.  Acute hypoxic respiratory failure/ARDS due to influenza A and superimposed pneumococcal pneumonia: He completed course of Tamiflu, Rocephin then 7 days of Zosyn. 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  Pulmonary following on a weekly basis tracheostomy was exchanged on 12/13/2022. Has been experiencing significant secretions requiring suctioning. Patient was transferred back to the ICU due to to significant secretions, underwent chest physiotherapy.  Pseudomonas pneumonia versus tracheitis: Culture on 12/01/2022 Pseudomonas resistant to imipenem. She completed 10-day course of Zosyn on 12/16/2022.  He also received a few days of levofloxacin. Patient is an tobramycin nebulizer treatment.  PEA arrest: Continue cardiac monitoring.  Recurrent fever/leukocytosis: Has had intermittent fevers throughout her hospital stay she has received multiple courses of antibiotics. He has completed courses of antibiotics and antifungal last day on 12/18/2022.  Paroxysmal sympathetic hyperactivity/sinus tachycardia: Continue metoprolol 200 mg twice a day plus diltiazem low-dose. Continue IV metoprolol as needed for heart rate greater than 110. Heart rate is hard to control will have low threshold for IV metoprolol.  Continue Neurontin.  TSH unremarkable. Echocardiogram shows a low EF without significant valvular abnormalities.  Constipation: Laxatives were held due to frequent bowel movements.  Hypotension: Continue midodrine blood pressure stable.  Severe protein caloric malnutrition: Continue tube feedings, PEG tube placed on  11/01/2022.  Prediabetes mellitus: A1c of 5.9 not requiring sliding scale for several days continue CBGs check.  Hypovolemic hyponatremia: Resolved with increased free water.  Diarrhea: Has resolved rectal tube in place will discontinue.  Sacral decubitus ulcer stage II RN Pressure Injury Documentation:     DVT prophylaxis: lovenox Family Communication:  Wife.  Status is: Inpatient Remains inpatient appropriate because: Devastating anoxic brain injury.    Code Status:     Code Status Orders  (From admission, onward)           Start     Ordered   11/07/22 1116  Full code  Continuous       Question:  By:  Answer:  Consent: discussion documented in EHR   11/07/22 1115           Code Status History     Date Active Date Inactive Code Status Order ID Comments User Context   10/09/2022 1851 11/07/2022 1115 DNR [id number]  Garner Nash, DO Inpatient   10/03/2022 0808 10/09/2022 1850 Full Code [id number]  Jonnie Finner, DO ED         IV Access:   Peripheral IV   Procedures and diagnostic studies:   No results found.   Medical Consultants:   None.   Subjective:    Chasin Bostelman nonverbal  Objective:    Vitals:   01/06/23 0608 01/06/23 0753 01/06/23 0829 01/06/23 0837  BP: 118/84  115/85 115/85  Pulse:  (!) 108 (!) 114 (!) 114  Resp:  18 18   Temp:   99.1 F (37.3 C)   TempSrc:   Axillary   SpO2:  92%    Weight:      Height:       SpO2: 92 %  O2 Flow Rate (L/min): 8 L/min FiO2 (%): 21 %   Intake/Output Summary (Last 24 hours) at 01/06/2023 0850 Last data filed at 01/06/2023 [id number] Gross per 24 hour  Intake 2252.75 ml  Output 2700 ml  Net -447.25 ml    Filed Weights   01/03/23 0500 01/04/23 0500 01/06/23 0500  Weight: 63 kg 63.1 kg 63.1 kg    Exam: General exam: In no acute distress. Respiratory system: Good air movement and clear to auscultation. Cardiovascular system: S1 & S2 heard, RRR. No JVD. Gastrointestinal system:  Abdomen is nondistended, soft and nontender.  Extremities: No pedal edema. Skin: No rashes, lesions or ulcers Data Reviewed:    Labs: Basic Metabolic Panel: No results for input(s): "NA", "K", "CL", "CO2", "GLUCOSE", "BUN", "CREATININE", "CALCIUM", "MG", "PHOS" in the last 168 hours.  GFR Estimated Creatinine Clearance: 82.2 mL/min (A) (by C-G formula based on SCr of 0.56 mg/dL (L)). Liver Function Tests: No results for input(s): "AST", "ALT", "ALKPHOS", "BILITOT", "PROT", "ALBUMIN" in the last 168 hours.  No results for input(s): "LIPASE", "AMYLASE" in the last 168 hours. No results for input(s): "AMMONIA" in the last 168 hours. Coagulation profile No results for input(s): "INR", "PROTIME" in the last 168 hours. COVID-19 Labs  No results for input(s): "DDIMER", "FERRITIN", "LDH", "CRP" in the last 72 hours.  Lab Results  Component Value Date   SARSCOV2NAA NEGATIVE 10/01/2022    CBC: No results for input(s): "WBC", "NEUTROABS", "HGB", "HCT", "MCV", "PLT" in the last 168 hours.  Cardiac Enzymes: No results for input(s): "CKTOTAL", "CKMB", "CKMBINDEX", "TROPONINI" in the last 168 hours. BNP (last 3 results) No results for input(s): "PROBNP" in the last 8760 hours. CBG: Recent Labs  Lab 01/04/23 1538 01/04/23 2330 01/05/23 0829 01/05/23 1616 01/05/23 2321  GLUCAP 126* 131* 114* 108* 134*    D-Dimer: No results for input(s): "DDIMER" in the last 72 hours. Hgb A1c: No results for input(s): "HGBA1C" in the last 72 hours. Lipid Profile: No results for input(s): "CHOL", "HDL", "LDLCALC", "TRIG", "CHOLHDL", "LDLDIRECT" in the last 72 hours. Thyroid function studies: No results for input(s): "TSH", "T4TOTAL", "T3FREE", "THYROIDAB" in the last 72 hours.  Invalid input(s): "FREET3"  Anemia work up: No results for input(s): "VITAMINB12", "FOLATE", "FERRITIN", "TIBC", "IRON", "RETICCTPCT" in the last 72 hours. Sepsis Labs: No results for input(s): "PROCALCITON", "WBC",  "LATICACIDVEN" in the last 168 hours.  Microbiology No results found for this or any previous visit (from the past 240 hour(s)).    Medications:    Chlorhexidine Gluconate Cloth  6 each Topical Daily   clonazepam  0.5 mg Per Tube BID   diltiazem  60 mg Per Tube Q6H   enoxaparin (LOVENOX) injection  40 mg Subcutaneous Q24H   famotidine  20 mg Per Tube QHS   feeding supplement (PROSource TF20)  60 mL Per Tube BID   fiber supplement (BANATROL TF)  60 mL Per Tube BID   free water  200 mL Per Tube Q8H   gabapentin  100 mg Per Tube Q8H   guaiFENesin  400 mg Per Tube Q8H   leptospermum manuka honey  1 Application Topical QHS   levETIRAcetam  1,500 mg Per Tube BID   metoprolol tartrate  200 mg Per Tube BID   midodrine  5 mg Per Tube Q8H   nutrition supplement (JUVEN)  1 packet Per Tube BID BM   mouth rinse  15 mL Mouth Rinse 4 times per day   scopolamine  1 patch Transdermal Q72H   valproic  acid  500 mg Per Tube Q8H   Continuous Infusions:  sodium chloride 250 mL (12/13/22 0041)   feeding supplement (OSMOLITE 1.5 CAL) 1,000 mL (01/06/23 0639)      LOS: 95 days   Charlynne Cousins  [facility]  01/06/2023, 8:50 AM

## 2023-01-07 LAB — GLUCOSE, CAPILLARY
Glucose-Capillary: 110 mg/dL — ABNORMAL HIGH (ref 70–99)
Glucose-Capillary: 151 mg/dL — ABNORMAL HIGH (ref 70–99)

## 2023-01-07 NOTE — Progress Notes (Signed)
[facility] PROGRESS NOTE    Progress Note  Damon [name]  [id number] DOB: 1961-09-15 DOA: 10/03/2022 PCP: [facility]     Brief Narrative:   Damon [name] is an 62 y.o. male past medical history of hypertension, hyperlipidemia tobacco abuse, chronic back pain comes into the [facility] ED on 10/03/2022 for influenza A protracted hospitalization  Significant Events: 12/18 Flu A positive 12/20 Admitted, bipap, 5 minute code MRSA PCR - neg Urine strep _POS 12/21 Intubated, paralyzed, ARDS protocol, on 7cc/kg 12/22 Weaned to 6 cc/kg with dyssynchrony, back on 7 cc/kg, driving pressures good, weaning vent 12/23 Attempt to wean sedation again with dyssynchrony and desaturation.  Some concern for cuff leak, tube exchange.  Cuff did not appear to be blown.  Ongoing signs of cuff leak, query leaking around cough, possible large trachea, diuresing 12/25 tolerating PSV, myoclonus> ceribell, Neuro consult, changed to propofol, diuresed  12/26 Changed to DNR. On Propofol, versed.  Tolerating PSV.  MRI brain with hypoxic/anoxic injury Blood culture neg 12/27 GPD's on EEG, pending transfer to [facility] for cEEG NEURO CONSULT 12/28 moved to [facility] for LTM EEG 12/31 family updated by neuro: no chance for meaningful recovery, they should make plans for comfort measures 1/1 cultures - resp normal flora 1/2 eeg still with epileptogenicity. Palli fam mtg -- family does not want to hear from medical teams unless there is "good news"  1/3  cEEG with burst suppression w highly epileptiform bursts.  1/4 cEEG with burst suppression  + highly epileptiform bursts still.  WBC slightly up to 22.5. Temp high 99 low 100  c-EEG stopped duie to lack of benefit Pallative care consult - wife upset about early dc from ER on 12/18 Chaplain consult: Wife is sufering from anticipatory grief + accepting god's way + curious/anger about events leading upto current state EEG: generalized epileptogenicity with  high potential for seizures as well as profound diffuse encephalopathy. In the setting of cardiac arrest, this EEG pattern is suggestive of anoxic/hypoxic brain injury.  1/5 - Low grade fever +. Oden 22.5K.  on Zosyn. On vent fio2 30%. TF +. Per RN - diprivan stopped yesterday. No myoclonus today. Mild tachycardia + Rpt pall care on 10/21/22 per wife 1/6 - On vent FiO2 30%.  Tmax 99,43F ., WBC better. Still off diprivan -> No myoclonus. cXR visualized - devices in place and faiure clear Oral oxy and klonpin stopped 1/8 No significant changes in neuro status. Tachycardic to 130s, hypertensive to 140s. Coreg transitioned to metoprolol. 1/11 wife considering trach/peg, to discuss with surgery-discussed with Dr. Bobbye Morton 1/12 more tachycardic 1/16-spoke with wife at length at bedside, Dr. Hulen Skains also had a long conversation with spouse-desires to continue current aggressive care and desires PEG and trach placed 1/18 perc trach and peg 11/05/2022 transferred to floor due to need for ICU bed 11/08/2022 transition to [facility] with pulmonary following weekly for trach 2/4 recurrent fever, cultures collected. 2/5 changed to cuffless trach 2/18 antibiotics started for Pseudomonas on tracheal aspirate cultre 2/22 concern for myoclonic seizure, neuro reconsulted 2/26 transferred to ICU for secretions.  3/1-3/3: Respiratory status has improved.  Completed 10 days of Zosyn. Okay for transfer back to progressive floor.  Assessment/Plan:   Goals of care: IPAL from 11/07/2022, he has been DNR but transition back to full code. Remains full code persistent discussion with spouse by different providers  have been unsuccessful. While continues to be in different she wants full press aggressive care.  She has unrealistic  expectations No events awaiting placement.  Anoxic brain injury/myoclonic seizure activity: Neurology had a long discussion with the family on 10/14/2022 that she has sustained  irreversible anoxic brain injury that is catastrophic family was encouraged to move towards comfort measures. EEG on 11/16/2022 show myoclonic seizures. Patient is currently on Keppra, Depakote and clonazepam. Patient has had no neurological improvement in the last 4 weeks. Talk to the wife over the weekend and today she has unrealistic expectations.  Acute hypoxic respiratory failure/ARDS due to influenza A and superimposed pneumococcal pneumonia: He completed course of Tamiflu, Rocephin then 7 days of Zosyn. 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  Pulmonary following on a weekly basis tracheostomy was exchanged on 12/13/2022. Has been experiencing significant secretions requiring suctioning. Patient was transferred back to the ICU due to to significant secretions, underwent chest physiotherapy.  Pseudomonas pneumonia versus tracheitis: Culture on 12/01/2022 Pseudomonas resistant to imipenem. She completed 10-day course of Zosyn on 12/16/2022.  He also received a few days of levofloxacin. Patient is an tobramycin nebulizer treatment.  PEA arrest: Continue cardiac monitoring.  Recurrent fever/leukocytosis: Has had intermittent fevers throughout her hospital stay she has received multiple courses of antibiotics. He has completed courses of antibiotics and antifungal last day on 12/18/2022.  Paroxysmal sympathetic hyperactivity/sinus tachycardia: Continue metoprolol 200 mg twice a day plus diltiazem low-dose. Continue IV metoprolol as needed for heart rate greater than 110. Heart rate is hard to control will have low threshold for IV metoprolol.  Continue Neurontin.  TSH unremarkable. Echocardiogram shows a low EF without significant valvular abnormalities.  Constipation: Laxatives were held due to frequent bowel movements.  Hypotension: Continue midodrine blood pressure stable.  Severe protein caloric malnutrition: Continue tube feedings, PEG tube placed on  11/01/2022.  Prediabetes mellitus: A1c of 5.9 not requiring sliding scale for several days continue CBGs check.  Hypovolemic hyponatremia: Resolved with increased free water.  Diarrhea: Has resolved rectal tube in place will discontinue.  Sacral decubitus ulcer stage II RN Pressure Injury Documentation:     DVT prophylaxis: lovenox Family Communication:  Wife.  Status is: Inpatient Remains inpatient appropriate because: Devastating anoxic brain injury.    Code Status:     Code Status Orders  (From admission, onward)           Start     Ordered   11/07/22 1116  Full code  Continuous       Question:  By:  Answer:  Consent: discussion documented in EHR   11/07/22 1115           Code Status History     Date Active Date Inactive Code Status Order ID Comments User Context   10/09/2022 1851 11/07/2022 1115 DNR [id number]  Garner Nash, DO Inpatient   10/03/2022 0808 10/09/2022 1850 Full Code [id number]  Jonnie Finner, DO ED         IV Access:   Peripheral IV   Procedures and diagnostic studies:   No results found.   Medical Consultants:   None.   Subjective:    Damon [name] nonverbal  Objective:    Vitals:   01/07/23 0305 01/07/23 0417 01/07/23 0500 01/07/23 0755  BP:  (!) 121/93  102/72  Pulse: (!) 103 (!) 106  (!) 111  Resp: (!) 22 (!) 23  (!) 26  Temp:  99.2 F (37.3 C)  98.8 F (37.1 C)  TempSrc:  Axillary  Axillary  SpO2: 97% 98%  92%  Weight:   63.1 kg   Height:  SpO2: 92 % O2 Flow Rate (L/min): 6 L/min FiO2 (%): 21 %   Intake/Output Summary (Last 24 hours) at 01/07/2023 0803 Last data filed at 01/07/2023 0442 Gross per 24 hour  Intake 575.25 ml  Output 1650 ml  Net -1074.75 ml    Filed Weights   01/04/23 0500 01/06/23 0500 01/07/23 0500  Weight: 63.1 kg 63.1 kg 63.1 kg    Exam: General exam: In no acute distress. Respiratory system: Good air movement and clear to auscultation. Cardiovascular system: S1  & S2 heard, RRR. No JVD. Gastrointestinal system: Abdomen is nondistended, soft and nontender.  Extremities: No pedal edema. Skin: No rashes, lesions or ulcers Data Reviewed:    Labs: Basic Metabolic Panel: No results for input(s): "NA", "K", "CL", "CO2", "GLUCOSE", "BUN", "CREATININE", "CALCIUM", "MG", "PHOS" in the last 168 hours.  GFR Estimated Creatinine Clearance: 82.2 mL/min (A) (by C-G formula based on SCr of 0.56 mg/dL (L)). Liver Function Tests: No results for input(s): "AST", "ALT", "ALKPHOS", "BILITOT", "PROT", "ALBUMIN" in the last 168 hours.  No results for input(s): "LIPASE", "AMYLASE" in the last 168 hours. No results for input(s): "AMMONIA" in the last 168 hours. Coagulation profile No results for input(s): "INR", "PROTIME" in the last 168 hours. COVID-19 Labs  No results for input(s): "DDIMER", "FERRITIN", "LDH", "CRP" in the last 72 hours.  Lab Results  Component Value Date   SARSCOV2NAA NEGATIVE 10/01/2022    CBC: No results for input(s): "WBC", "NEUTROABS", "HGB", "HCT", "MCV", "PLT" in the last 168 hours.  Cardiac Enzymes: No results for input(s): "CKTOTAL", "CKMB", "CKMBINDEX", "TROPONINI" in the last 168 hours. BNP (last 3 results) No results for input(s): "PROBNP" in the last 8760 hours. CBG: Recent Labs  Lab 01/05/23 1616 01/05/23 2321 01/06/23 0827 01/06/23 2316 01/07/23 0752  GLUCAP 108* 134* 118* 114* 110*    D-Dimer: No results for input(s): "DDIMER" in the last 72 hours. Hgb A1c: No results for input(s): "HGBA1C" in the last 72 hours. Lipid Profile: No results for input(s): "CHOL", "HDL", "LDLCALC", "TRIG", "CHOLHDL", "LDLDIRECT" in the last 72 hours. Thyroid function studies: No results for input(s): "TSH", "T4TOTAL", "T3FREE", "THYROIDAB" in the last 72 hours.  Invalid input(s): "FREET3"  Anemia work up: No results for input(s): "VITAMINB12", "FOLATE", "FERRITIN", "TIBC", "IRON", "RETICCTPCT" in the last 72 hours. Sepsis  Labs: No results for input(s): "PROCALCITON", "WBC", "LATICACIDVEN" in the last 168 hours.  Microbiology No results found for this or any previous visit (from the past 240 hour(s)).    Medications:    Chlorhexidine Gluconate Cloth  6 each Topical Daily   clonazePAM  0.5 mg Per Tube BID   diltiazem  60 mg Per Tube Q6H   enoxaparin (LOVENOX) injection  40 mg Subcutaneous Q24H   famotidine  20 mg Per Tube QHS   feeding supplement (PROSource TF20)  60 mL Per Tube BID   fiber supplement (BANATROL TF)  60 mL Per Tube BID   free water  200 mL Per Tube Q8H   gabapentin  100 mg Per Tube Q8H   guaiFENesin  400 mg Per Tube Q8H   leptospermum manuka honey  1 Application Topical QHS   levETIRAcetam  1,500 mg Per Tube BID   metoprolol tartrate  200 mg Per Tube BID   midodrine  5 mg Per Tube Q8H   nutrition supplement (JUVEN)  1 packet Per Tube BID BM   mouth rinse  15 mL Mouth Rinse 4 times per day   scopolamine  1 patch Transdermal Q72H  valproic acid  500 mg Per Tube Q8H   Continuous Infusions:  sodium chloride 250 mL (12/13/22 0041)   feeding supplement (OSMOLITE 1.5 CAL) 1,000 mL (01/07/23 0118)      LOS: 96 days   Charlynne Cousins  [facility]  01/07/2023, 8:03 AM

## 2023-01-07 NOTE — Progress Notes (Signed)
Nutrition Follow-up  DOCUMENTATION CODES:   Non-severe (moderate) malnutrition in context of acute illness/injury  INTERVENTION:   Continue tube feeds via PEG: - Osmolite 1.5 @ 65 ml/hr (1560 ml/day) - PROSource TF20 60 ml BID - Free water flushes of 200 ml q 8 hours  Tube feeding regimen and current free water flushes provide 2500 kcal, 138 grams of protein, and 1789 ml of H2O.   - Continue Banatrol TF BID per tube, each packet provides 5 g of soluble fiber, to thicken stool consistency as pt still with rectal tube in place   - Continue Juven BID per tube, each packet provides 80 calories, 8 grams of carbohydrate, 2.5 grams of protein (collagen), 7 grams of L-arginine and 7 grams of L-glutamine; supplement contains CaHMB, Vitamins C, E, B12 and Zinc to promote wound healing  NUTRITION DIAGNOSIS:   Moderate Malnutrition related to acute illness as evidenced by mild fat depletion, moderate fat depletion, severe fat depletion, severe muscle depletion, percent weight loss.  Ongoing, being addressed via TF  GOAL:   Patient will meet greater than or equal to 90% of their needs  Met via increased TF  MONITOR:   Labs, Weight trends, TF tolerance, Skin, I & O's  REASON FOR ASSESSMENT:   Consult Enteral/tube feeding initiation and management  ASSESSMENT:   Pt admitted from home with the flu leading to acute respiratory failure with hypoxia and PEA arrest upon admission. PMH significant for HTN, HLD, chronic back pain, tobacco use.  Pt remains NPO with tube feeds infusing via PEG.  Admit weight: 64.9 kg Current weight: *** kg  Current TF: Osmolite 1.5 @ 65 ml/hr, PROSource TF20 60 ml BID, free water flushes 200 ml q 8 hours  Medications reviewed and include: ***  Labs reviewed: ***  UOP: ***  Diet Order:   Diet Order     None       EDUCATION NEEDS:   No education needs have been identified at this time  Skin:  Skin Assessment: Skin Integrity Issues: Other:  crack to perineal area  Last BM:  12/31/22 type 6  Height:   Ht Readings from Last 1 Encounters:  10/11/22 5\' 4"  (1.626 m)    Weight:   Wt Readings from Last 1 Encounters:  01/07/23 63.1 kg    BMI:  Body mass index is 23.88 kg/m.  Estimated Nutritional Needs:   Kcal:  2500-2700 kcals  Protein:  125-150 grams  Fluid:  >/= 2 L/day    Gustavus Bryant, MS, RD, LDN Inpatient Clinical Dietitian Please see AMiON for contact information.

## 2023-01-08 DIAGNOSIS — J9601 Acute respiratory failure with hypoxia: Secondary | ICD-10-CM | POA: Diagnosis not present

## 2023-01-08 LAB — GLUCOSE, CAPILLARY
Glucose-Capillary: 138 mg/dL — ABNORMAL HIGH (ref 70–99)
Glucose-Capillary: 131 mg/dL — ABNORMAL HIGH (ref 70–99)
Glucose-Capillary: 129 mg/dL — ABNORMAL HIGH (ref 70–99)

## 2023-01-08 NOTE — Progress Notes (Signed)
CSW attempted to reach Whitney at [facility] without success - a text was sent requesting an update.  Madilyn Fireman, MSW, LCSW Transitions of Care  Clinical Social Worker II [phone]

## 2023-01-08 NOTE — Progress Notes (Signed)
[facility] PROGRESS NOTE    Progress Note  Damon [name]  [id number] DOB: 12-03-1960 DOA: 10/03/2022 PCP: [facility]     Brief Narrative:   Damon [name] is an 62 y.o. male past medical history of hypertension, hyperlipidemia tobacco abuse, chronic back pain comes into the [place] long ED on 10/03/2022 for influenza A protracted hospitalization  Significant Events: 12/18 Flu A positive 12/20 Admitted, bipap, 5 minute code MRSA PCR - neg Urine strep _POS 12/21 Intubated, paralyzed, ARDS protocol, on 7cc/kg 12/22 Weaned to 6 cc/kg with dyssynchrony, back on 7 cc/kg, driving pressures good, weaning vent 12/23 Attempt to wean sedation again with dyssynchrony and desaturation.  Some concern for cuff leak, tube exchange.  Cuff did not appear to be blown.  Ongoing signs of cuff leak, query leaking around cough, possible large trachea, diuresing 12/25 tolerating PSV, myoclonus> ceribell, Neuro consult, changed to propofol, diuresed  12/26 Changed to DNR. On Propofol, versed.  Tolerating PSV.  MRI brain with hypoxic/anoxic injury Blood culture neg 12/27 GPD's on EEG, pending transfer to [facility] for cEEG NEURO CONSULT 12/28 moved to [facility] for LTM EEG 12/31 family updated by neuro: no chance for meaningful recovery, they should make plans for comfort measures 1/1 cultures - resp normal flora 1/2 eeg still with epileptogenicity. Palli fam mtg -- family does not want to hear from medical teams unless there is "good news"  1/3  cEEG with burst suppression w highly epileptiform bursts.  1/4 cEEG with burst suppression  + highly epileptiform bursts still.  WBC slightly up to 22.5. Temp high 99 low 100  c-EEG stopped duie to lack of benefit Pallative care consult - wife upset about early dc from ER on 12/18 Chaplain consult: Wife is sufering from anticipatory grief + accepting god's way + curious/anger about events leading upto current state EEG: generalized epileptogenicity with  high potential for seizures as well as profound diffuse encephalopathy. In the setting of cardiac arrest, this EEG pattern is suggestive of anoxic/hypoxic brain injury.  1/5 - Low grade fever +. [place] 22.5K.  on Zosyn. On vent fio2 30%. TF +. Per RN - diprivan stopped yesterday. No myoclonus today. Mild tachycardia + Rpt pall care on 10/21/22 per wife 1/6 - On vent FiO2 30%.  Tmax 99,89F ., WBC better. Still off diprivan -> No myoclonus. cXR visualized - devices in place and faiure clear Oral oxy and klonpin stopped 1/8 No significant changes in neuro status. Tachycardic to 130s, hypertensive to 140s. Coreg transitioned to metoprolol. 1/11 wife considering trach/peg, to discuss with surgery-discussed with Dr. Bobbye Morton 1/12 more tachycardic 1/16-spoke with wife at length at bedside, Dr. Hulen Skains also had a long conversation with spouse-desires to continue current aggressive care and desires PEG and trach placed 1/18 perc trach and peg 11/05/2022 transferred to floor due to need for ICU bed 11/08/2022 transition to [facility] with pulmonary following weekly for trach 2/4 recurrent fever, cultures collected. 2/5 changed to cuffless trach 2/18 antibiotics started for Pseudomonas on tracheal aspirate cultre 2/22 concern for myoclonic seizure, neuro reconsulted 2/26 transferred to ICU for secretions.  3/1-3/3: Respiratory status has improved.  Completed 10 days of Zosyn. Okay for transfer back to progressive floor.  Assessment/Plan:   Goals of care: IPAL from 11/07/2022, he has been DNR but transition back to full code. Remains full code persistent discussion with spouse by different providers  have been unsuccessful. While continues to be in different she wants full press aggressive care.  She has unrealistic  expectations No events awaiting placement.  Anoxic brain injury/myoclonic seizure activity: Neurology had a long discussion with the family on 10/14/2022 that she has sustained  irreversible anoxic brain injury that is catastrophic family was encouraged to move towards comfort measures. EEG on 11/16/2022 show myoclonic seizures. Patient is currently on Keppra, Depakote and clonazepam. Patient has had no neurological improvement in the last 4 weeks. Talk to the wife over the weekend and today she has unrealistic expectations.  Acute hypoxic respiratory failure/ARDS due to influenza A and superimposed pneumococcal pneumonia: He completed course of Tamiflu, Rocephin then 7 days of Zosyn. 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  Pulmonary following on a weekly basis tracheostomy was exchanged on 12/13/2022. Has been experiencing significant secretions requiring suctioning. Patient was transferred back to the ICU due to to significant secretions, underwent chest physiotherapy.  Pseudomonas pneumonia versus tracheitis: Culture on 12/01/2022 Pseudomonas resistant to imipenem. She completed 10-day course of Zosyn on 12/16/2022.  He also received a few days of levofloxacin. Patient is an tobramycin nebulizer treatment.  PEA arrest: Continue cardiac monitoring.  Recurrent fever/leukocytosis: Has had intermittent fevers throughout her hospital stay she has received multiple courses of antibiotics. He has completed courses of antibiotics and antifungal last day on 12/18/2022.  Paroxysmal sympathetic hyperactivity/sinus tachycardia: Continue metoprolol 200 mg twice a day plus diltiazem low-dose. Continue IV metoprolol as needed for heart rate greater than 110. Heart rate is hard to control will have low threshold for IV metoprolol.  Continue Neurontin.  TSH unremarkable. Echocardiogram shows a low EF without significant valvular abnormalities.  Constipation: Laxatives were held due to frequent bowel movements.  Hypotension: Continue midodrine blood pressure stable.  Severe protein caloric malnutrition: Continue tube feedings, PEG tube placed on  11/01/2022.  Prediabetes mellitus: A1c of 5.9 not requiring sliding scale for several days continue CBGs check.  Hypovolemic hyponatremia: Resolved with increased free water.  Diarrhea: Has resolved rectal tube in place will discontinue.  Sacral decubitus ulcer stage II RN Pressure Injury Documentation:     DVT prophylaxis: lovenox Family Communication:  Wife.  Status is: Inpatient Remains inpatient appropriate because: Devastating anoxic brain injury.    Code Status:     Code Status Orders  (From admission, onward)           Start     Ordered   11/07/22 1116  Full code  Continuous       Question:  By:  Answer:  Consent: discussion documented in EHR   11/07/22 1115           Code Status History     Date Active Date Inactive Code Status Order ID Comments User Context   10/09/2022 1851 11/07/2022 1115 DNR [id number]  Garner Nash, DO Inpatient   10/03/2022 0808 10/09/2022 1850 Full Code [id number]  Jonnie Finner, DO ED         IV Access:   Peripheral IV   Procedures and diagnostic studies:   No results found.   Medical Consultants:   None.   Subjective:    Damon [name] nonverbal  Objective:    Vitals:   01/08/23 0500 01/08/23 0638 01/08/23 0745 01/08/23 0828  BP:  (!) 127/95 108/84 118/87  Pulse:   (!) 113 (!) 109  Resp:   20 20  Temp:   98.6 F (37 C) 98.6 F (37 C)  TempSrc:   Axillary Oral  SpO2:   94% 92%  Weight: 63.1 kg     Height:  SpO2: 92 % O2 Flow Rate (L/min): 6 L/min FiO2 (%): 21 %   Intake/Output Summary (Last 24 hours) at 01/08/2023 1014 Last data filed at 01/08/2023 0515 Gross per 24 hour  Intake 260 ml  Output 1050 ml  Net -790 ml    Filed Weights   01/06/23 0500 01/07/23 0500 01/08/23 0500  Weight: 63.1 kg 63.1 kg 63.1 kg    Exam: General exam: In no acute distress. Respiratory system: Good air movement and clear to auscultation. Cardiovascular system: S1 & S2 heard, RRR. No  JVD. Gastrointestinal system: Abdomen is nondistended, soft and nontender.  Extremities: No pedal edema. Skin: No rashes, lesions or ulcers Data Reviewed:    Labs: Basic Metabolic Panel: No results for input(s): "NA", "K", "CL", "CO2", "GLUCOSE", "BUN", "CREATININE", "CALCIUM", "MG", "PHOS" in the last 168 hours.  GFR Estimated Creatinine Clearance: 82.2 mL/min (A) (by C-G formula based on SCr of 0.56 mg/dL (L)). Liver Function Tests: No results for input(s): "AST", "ALT", "ALKPHOS", "BILITOT", "PROT", "ALBUMIN" in the last 168 hours.  No results for input(s): "LIPASE", "AMYLASE" in the last 168 hours. No results for input(s): "AMMONIA" in the last 168 hours. Coagulation profile No results for input(s): "INR", "PROTIME" in the last 168 hours. COVID-19 Labs  No results for input(s): "DDIMER", "FERRITIN", "LDH", "CRP" in the last 72 hours.  Lab Results  Component Value Date   SARSCOV2NAA NEGATIVE 10/01/2022    CBC: No results for input(s): "WBC", "NEUTROABS", "HGB", "HCT", "MCV", "PLT" in the last 168 hours.  Cardiac Enzymes: No results for input(s): "CKTOTAL", "CKMB", "CKMBINDEX", "TROPONINI" in the last 168 hours. BNP (last 3 results) No results for input(s): "PROBNP" in the last 8760 hours. CBG: Recent Labs  Lab 01/06/23 0827 01/06/23 2316 01/07/23 0752 01/07/23 2330 01/08/23 0828  GLUCAP 118* 114* 110* 151* 138*    D-Dimer: No results for input(s): "DDIMER" in the last 72 hours. Hgb A1c: No results for input(s): "HGBA1C" in the last 72 hours. Lipid Profile: No results for input(s): "CHOL", "HDL", "LDLCALC", "TRIG", "CHOLHDL", "LDLDIRECT" in the last 72 hours. Thyroid function studies: No results for input(s): "TSH", "T4TOTAL", "T3FREE", "THYROIDAB" in the last 72 hours.  Invalid input(s): "FREET3"  Anemia work up: No results for input(s): "VITAMINB12", "FOLATE", "FERRITIN", "TIBC", "IRON", "RETICCTPCT" in the last 72 hours. Sepsis Labs: No results for  input(s): "PROCALCITON", "WBC", "LATICACIDVEN" in the last 168 hours.  Microbiology No results found for this or any previous visit (from the past 240 hour(s)).    Medications:    Chlorhexidine Gluconate Cloth  6 each Topical Daily   clonazePAM  0.5 mg Per Tube BID   diltiazem  60 mg Per Tube Q6H   enoxaparin (LOVENOX) injection  40 mg Subcutaneous Q24H   famotidine  20 mg Per Tube QHS   feeding supplement (PROSource TF20)  60 mL Per Tube BID   fiber supplement (BANATROL TF)  60 mL Per Tube BID   free water  200 mL Per Tube Q8H   gabapentin  100 mg Per Tube Q8H   guaiFENesin  400 mg Per Tube Q8H   leptospermum manuka honey  1 Application Topical QHS   levETIRAcetam  1,500 mg Per Tube BID   metoprolol tartrate  200 mg Per Tube BID   midodrine  5 mg Per Tube Q8H   nutrition supplement (JUVEN)  1 packet Per Tube BID BM   mouth rinse  15 mL Mouth Rinse 4 times per day   scopolamine  1 patch Transdermal Q72H  valproic acid  500 mg Per Tube Q8H   Continuous Infusions:  sodium chloride 250 mL (12/13/22 0041)   feeding supplement (OSMOLITE 1.5 CAL) 1,000 mL (01/07/23 2112)      LOS: 97 days   Charlynne Cousins  [facility]  01/08/2023, 10:14 AM

## 2023-01-08 NOTE — Progress Notes (Signed)
NAME:  Damon [name], MRN:  [id number], DOB:  21-Feb-1961, LOS: 25 ADMISSION DATE:  10/03/2022, CONSULTATION DATE:  10/03/2022 REFERRING MD:  Marylyn Ishihara - [facility] CHIEF COMPLAINT:  Dyspnea   History of Present Illness:  62 year old man admitted to [facility] 12/20 in the setting of acute hypoxemic respiratory failure due to Flu A. PMHx significant for HTN, HLD, chronic back pain, tobacco abuse   Placed on BIPAP.  Coded (likely in the setting of hypoxia), initial rhythm PEA with CPR x 5 minutes, required intubation.  After several days on mechanical ventilation remains comatose, MRI Brain 12/26 worrisome for anoxic brain injury.  Transferred to [facility] for LTM EEG monitoring.  Neurology consulted. LTM with generalized epileptogenicity. Treated with Propofol, Depakote, Keppra, Ketamine. 12/31 Neurology with goals of care with family. Relayed that patient has a grim neurological recovery due to irreversible anoxic injury. Family wished to continue aggressive measures.   Palliative care consulted. Stay complicated with neuro-storming.   Surgical consult regarding PEG/trach.  Pertinent Medical History:   Past Medical History:  Diagnosis Date   Elevated lipids    Hypertension    Tobacco abuse    Significant Hospital Events: Including procedures, antibiotic start and stop dates in addition to other pertinent events   12/18 Flu A positive 12/20 Admitted, 5 minute code after non-compliance with BiPAP. MRSA PCR - neg, Urine strep > POS 12/21 Intubated, paralyzed, ARDS protocol, on 7cc/kg 12/22 Weaned to 6 cc/kg with dyssynchrony, back on 7 cc/kg, driving pressures good, weaning vent 12/23 Attempt to wean sedation again with dyssynchrony and desaturation.  Some concern for cuff leak, tube exchange.  Cuff did not appear to be blown.  Ongoing signs of cuff leak, query leaking around cough, possible large trachea, diuresing 12/25 tolerating PSV, myoclonus> ceribell, Neuro consult, changed to propofol, diuresed  12/26  Changed to DNR. On Propofol, versed.  Tolerating PSV.  MRI brain with hypoxic/anoxic injury. Blood culture neg 12/27 GPD's on EEG, pending transfer to [facility] for cEEG. NEURO CONSULT 12/28 moved to [facility] for LTM EEG 12/31 family updated by neuro: no chance for meaningful recovery, they should make plans for comfort measures 1/1 cultures - resp normal flora 1/2 eeg still with epileptogenicity. Palli fam mtg -- family does not want to hear from medical teams unless there is "good news"  1/3  cEEG with burst suppression w highly epileptiform bursts.  1/4 cEEG with burst suppression  + highly epileptiform bursts still.  WBC slightly up to 22.5. Temp high 99 low 100  c-EEG stopped due to lack of benefit Pallative care consult - wife upset about early dc from ER on 12/18 Chaplain consult: Wife is sufering from anticipatory grief + accepting god's way + curious/anger about events leading upto current state EEG: generalized epileptogenicity with high potential for seizures as well as profound diffuse encephalopathy. In the setting of cardiac arrest, this EEG pattern is suggestive of anoxic/hypoxic brain injury.  1/5 Low grade fever +. [place] 22.5K.  on Zosyn. On vent fio2 30%. TF +. Per RN - diprivan stopped yesterday. No myoclonus today. Mild tachycardia + Rpt pall care on 10/21/22 per wife 1/6 On vent FiO2 30%.  Tmax 99,26F ., WBC better. Still off diprivan -> No myoclonus. cXR visualized - devices in place and faiure clear. Oral oxy and klonpin stopped 1/8 No significant changes in neuro status. Tachycardic to 130s, hypertensive to 140s. Coreg transitioned to metoprolol. 1/11 wife considering trach/peg, to discuss with surgery-discussed with Dr. Bobbye Morton 1/12 more tachycardic 1/16-spoke with  wife at length at bedside, Dr. Hulen Skains also had a long conversation with spouse-desires to continue current aggressive care and desires PEG and trach placed 1/18 perc trach and peg 11/05/2022 transferred to floor due to need for  ICU bed 11/08/2022 transition to [facility] with pulmonary following weekly for trach 2/5 ATC 28%, no acute issues with trach  2/26 Copious tracheal sections in the setting of pseudomonas pneumonia resulting in need to move to ICU for close monitoring/ RT care. 2/29 >> Trach change 12/17/2022>> Waiting on Progressive bed 3/18 Cont ATC. Due for trach change    Interim History / Subjective:  Condition unchanged, continues to await placement.  Objective:  Blood pressure 118/87, pulse (!) 109, temperature 98.6 F (37 C), temperature source Oral, resp. rate 20, height 5\' 4"  (1.626 m), weight 63.1 kg, SpO2 92 %.    FiO2 (%):  [21 %] 21 %   Intake/Output Summary (Last 24 hours) at 01/08/2023 1240 Last data filed at 01/08/2023 0515 Gross per 24 hour  Intake 0 ml  Output 1050 ml  Net -1050 ml   BP 118/87 (BP Location: Right Arm)   Pulse (!) 109   Temp 98.6 F (37 C) (Oral)   Resp 20   Ht 5\' 4"  (1.626 m)   Wt 63.1 kg   SpO2 92%   BMI 23.88 kg/m   Filed Weights   01/06/23 0500 01/07/23 0500 01/08/23 0500  Weight: 63.1 kg 63.1 kg 63.1 kg   Physical Exam: General: Ill adult M  HEENT: Trach secure copious thick white secretions  Neck: 6 cuffless trach  Neuro: Eyes are open does not follow commands  CV: rr  PULM: Upper rhonchi. Symmetrical chest expansion  GI: soft  Extremities: no acute joint deformity  Skin: clean warm    Resolved   Flu A AKI CAP, pneumococcal pneumonia - s/p Zosyn 12/31 - 1/7; Ceftriaxone 12/20- 12/27, Azithro 12/20, Flagyl 12/20 - 12/22 Vent dependence  Assessment & Plan:  Problems: Anoxic brain injury  Seizures Paroxysmal sympathetic hyperactivity Fever Leukocytosis  Anemia Hyperkalemia Tachycardia/hypotension due to paroxysmal sympathetic hyperactivity Severe protein calorie malnutrition Hyperglycemia    Acute respiratory failure with hypoxia Tracheostomy dependence Hx ARDS, flu and aspiration   Hx pseudomonas PNA   Atelectasis   Plan: -Cont routine trach care -- next trach change 4/18 -wouldn't downsize yet given burden of thick secretions  -pulm hygiene -PRN imaging  -SpO2 goal > 90   Kipp Brood, MD [facility] ICU Physician [place]  Pager: [phone] Or Epic Secure Chat After hours: [phone].  01/08/2023, 12:42 PM     01/08/2023, 12:40 PM

## 2023-01-09 LAB — GLUCOSE, CAPILLARY
Glucose-Capillary: 125 mg/dL — ABNORMAL HIGH (ref 70–99)
Glucose-Capillary: 111 mg/dL — ABNORMAL HIGH (ref 70–99)
Glucose-Capillary: 123 mg/dL — ABNORMAL HIGH (ref 70–99)

## 2023-01-09 NOTE — Progress Notes (Signed)
[facility] PROGRESS NOTE    Progress Note  Damon [name]  [id number] DOB: 06/06/1961 DOA: 10/03/2022 PCP: [facility]     Brief Narrative:   Damon [name] is an 62 y.o. male past medical history of hypertension, hyperlipidemia tobacco abuse, chronic back pain comes into the [facility] long ED on 10/03/2022 for influenza A protracted hospitalization  Significant Events: 12/18 Flu A positive 12/20 Admitted, bipap, 5 minute code MRSA PCR - neg Urine strep _POS 12/21 Intubated, paralyzed, ARDS protocol, on 7cc/kg 12/22 Weaned to 6 cc/kg with dyssynchrony, back on 7 cc/kg, driving pressures good, weaning vent 12/23 Attempt to wean sedation again with dyssynchrony and desaturation.  Some concern for cuff leak, tube exchange.  Cuff did not appear to be blown.  Ongoing signs of cuff leak, query leaking around cough, possible large trachea, diuresing 12/25 tolerating PSV, myoclonus> ceribell, Neuro consult, changed to propofol, diuresed  12/26 Changed to DNR. On Propofol, versed.  Tolerating PSV.  MRI brain with hypoxic/anoxic injury Blood culture neg 12/27 GPD's on EEG, pending transfer to [facility] for cEEG NEURO CONSULT 12/28 moved to [facility] for LTM EEG 12/31 family updated by neuro: no chance for meaningful recovery, they should make plans for comfort measures 1/1 cultures - resp normal flora 1/2 eeg still with epileptogenicity. Palli fam mtg -- family does not want to hear from medical teams unless there is "good news"  1/3  cEEG with burst suppression w highly epileptiform bursts.  1/4 cEEG with burst suppression  + highly epileptiform bursts still.  WBC slightly up to 22.5. Temp high 99 low 100  c-EEG stopped duie to lack of benefit Pallative care consult - wife upset about early dc from ER on 12/18 Chaplain consult: Wife is sufering from anticipatory grief + accepting god's way + curious/anger about events leading upto current state EEG: generalized epileptogenicity with  high potential for seizures as well as profound diffuse encephalopathy. In the setting of cardiac arrest, this EEG pattern is suggestive of anoxic/hypoxic brain injury.  1/5 - Low grade fever +. [place] 22.5K.  on Zosyn. On vent fio2 30%. TF +. Per RN - diprivan stopped yesterday. No myoclonus today. Mild tachycardia + Rpt pall care on 10/21/22 per wife 1/6 - On vent FiO2 30%.  Tmax 99,73F ., WBC better. Still off diprivan -> No myoclonus. cXR visualized - devices in place and faiure clear Oral oxy and klonpin stopped 1/8 No significant changes in neuro status. Tachycardic to 130s, hypertensive to 140s. Coreg transitioned to metoprolol. 1/11 wife considering trach/peg, to discuss with surgery-discussed with Dr. Bobbye Morton 1/12 more tachycardic 1/16-spoke with wife at length at bedside, Dr. Hulen Skains also had a long conversation with spouse-desires to continue current aggressive care and desires PEG and trach placed 1/18 perc trach and peg 11/05/2022 transferred to floor due to need for ICU bed 11/08/2022 transition to [facility] with pulmonary following weekly for trach 2/4 recurrent fever, cultures collected. 2/5 changed to cuffless trach 2/18 antibiotics started for Pseudomonas on tracheal aspirate cultre 2/22 concern for myoclonic seizure, neuro reconsulted 2/26 transferred to ICU for secretions.  3/1-3/3: Respiratory status has improved.  Completed 10 days of Zosyn. Okay for transfer back to progressive floor.  Assessment/Plan:   Goals of care: IPAL from 11/07/2022, he has been DNR but transition back to full code. Remains full code persistent discussion with spouse by different providers  have been unsuccessful. While continues to be in different she wants full press aggressive care.  She has unrealistic  expectations No events awaiting placement.  Anoxic brain injury/myoclonic seizure activity: Neurology had a long discussion with the family on 10/14/2022 that she has sustained  irreversible anoxic brain injury that is catastrophic family was encouraged to move towards comfort measures. EEG on 11/16/2022 show myoclonic seizures. Patient is currently on Keppra, Depakote and clonazepam. Patient has had no neurological improvement in the last 4 weeks.  Acute hypoxic respiratory failure/ARDS due to influenza A and superimposed pneumococcal pneumonia: He completed course of Tamiflu, Rocephin then 7 days of Zosyn. 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  Pulmonary following on a weekly basis tracheostomy was exchanged on 12/13/2022. Has been experiencing significant secretions requiring suctioning  Pseudomonas pneumonia versus tracheitis: Culture on 12/01/2022 Pseudomonas resistant to imipenem. She completed 10-day course of Zosyn on 12/16/2022.  He also received a few days of levofloxacin. Patient is an tobramycin nebulizer treatment.  PEA arrest: Continue cardiac monitoring.  Recurrent fever/leukocytosis: Has had intermittent fevers throughout hospital stay, received multiple courses of antibiotics. He has completed courses of antibiotics and antifungal last day on 12/18/2022.  Paroxysmal sympathetic hyperactivity/sinus tachycardia: Continue metoprolol 200 mg twice a day plus diltiazem low-dose. Continue IV metoprolol as needed for heart rate greater than 110. Heart rate is hard to control will have low threshold for IV metoprolol.  Continue Neurontin.  TSH unremarkable. Echocardiogram shows a low EF without significant valvular abnormalities.  Constipation: Laxatives were held due to frequent bowel movements.  Hypotension: Continue midodrine blood pressure stable.  Severe protein caloric malnutrition: Continue tube feedings, PEG tube placed on 11/01/2022.  Prediabetes mellitus: A1c of 5.9 not requiring sliding scale for several days continue CBGs check  Sacral decubitus ulcer stage II RN Pressure Injury Documentation:     DVT prophylaxis:  lovenox Family Communication:  none at bedside Status is: Inpatient Remains inpatient appropriate because: Devastating anoxic brain injury.    Code Status:     Code Status Orders  (From admission, onward)           Start     Ordered   11/07/22 1116  Full code  Continuous       Question:  By:  Answer:  Consent: discussion documented in EHR   11/07/22 1115           Code Status History     Date Active Date Inactive Code Status Order ID Comments User Context   10/09/2022 1851 11/07/2022 1115 DNR [id number]  Garner Nash, DO Inpatient   10/03/2022 0808 10/09/2022 1850 Full Code [id number]  Jonnie Finner, DO ED         IV Access:   Peripheral IV   Procedures and diagnostic studies:   No results found.   Medical Consultants:   None.   Subjective:    Damon [name] non verbal  Objective:    Vitals:   01/09/23 1556 01/09/23 1600 01/09/23 1700 01/09/23 1800  BP:      Pulse: (!) 114 (!) 119 (!) 125 (!) 114  Resp: (!) 23 (!) 24 (!) 24 (!) 25  Temp:      TempSrc:      SpO2: 95% 96% 94% 91%  Weight:      Height:       SpO2: 91 % O2 Flow Rate (L/min): 6 L/min FiO2 (%): 21 %   Intake/Output Summary (Last 24 hours) at 01/09/2023 1849 Last data filed at 01/09/2023 1817 Gross per 24 hour  Intake 5565 ml  Output 1000 ml  Net 4565 ml  Filed Weights   01/07/23 0500 01/08/23 0500 01/09/23 0500  Weight: 63.1 kg 63.1 kg 63.1 kg    Exam: General: NAD  Cardiovascular: S1, S2 present Respiratory: CTAB Abdomen: Soft, nontender, nondistended, bowel sounds present Musculoskeletal: No bilateral pedal edema noted Skin: Decubitus ulcer Psychiatry: Unable to assess  Data Reviewed:    Labs: Basic Metabolic Panel: No results for input(s): "NA", "K", "CL", "CO2", "GLUCOSE", "BUN", "CREATININE", "CALCIUM", "MG", "PHOS" in the last 168 hours.  GFR Estimated Creatinine Clearance: 82.2 mL/min (A) (by C-G formula based on SCr of 0.56 mg/dL (L)). Liver  Function Tests: No results for input(s): "AST", "ALT", "ALKPHOS", "BILITOT", "PROT", "ALBUMIN" in the last 168 hours.  No results for input(s): "LIPASE", "AMYLASE" in the last 168 hours. No results for input(s): "AMMONIA" in the last 168 hours. Coagulation profile No results for input(s): "INR", "PROTIME" in the last 168 hours. COVID-19 Labs  No results for input(s): "DDIMER", "FERRITIN", "LDH", "CRP" in the last 72 hours.  Lab Results  Component Value Date   SARSCOV2NAA NEGATIVE 10/01/2022    CBC: No results for input(s): "WBC", "NEUTROABS", "HGB", "HCT", "MCV", "PLT" in the last 168 hours.  Cardiac Enzymes: No results for input(s): "CKTOTAL", "CKMB", "CKMBINDEX", "TROPONINI" in the last 168 hours. BNP (last 3 results) No results for input(s): "PROBNP" in the last 8760 hours. CBG: Recent Labs  Lab 01/08/23 0828 01/08/23 1553 01/08/23 2321 01/09/23 0758 01/09/23 1520  GLUCAP 138* 131* 129* 125* 111*   D-Dimer: No results for input(s): "DDIMER" in the last 72 hours. Hgb A1c: No results for input(s): "HGBA1C" in the last 72 hours. Lipid Profile: No results for input(s): "CHOL", "HDL", "LDLCALC", "TRIG", "CHOLHDL", "LDLDIRECT" in the last 72 hours. Thyroid function studies: No results for input(s): "TSH", "T4TOTAL", "T3FREE", "THYROIDAB" in the last 72 hours.  Invalid input(s): "FREET3"  Anemia work up: No results for input(s): "VITAMINB12", "FOLATE", "FERRITIN", "TIBC", "IRON", "RETICCTPCT" in the last 72 hours. Sepsis Labs: No results for input(s): "PROCALCITON", "WBC", "LATICACIDVEN" in the last 168 hours.  Microbiology No results found for this or any previous visit (from the past 240 hour(s)).    Medications:    Chlorhexidine Gluconate Cloth  6 each Topical Daily   clonazePAM  0.5 mg Per Tube BID   diltiazem  60 mg Per Tube Q6H   enoxaparin (LOVENOX) injection  40 mg Subcutaneous Q24H   famotidine  20 mg Per Tube QHS   feeding supplement (PROSource TF20)   60 mL Per Tube BID   fiber supplement (BANATROL TF)  60 mL Per Tube BID   free water  200 mL Per Tube Q8H   gabapentin  100 mg Per Tube Q8H   guaiFENesin  400 mg Per Tube Q8H   leptospermum manuka honey  1 Application Topical QHS   levETIRAcetam  1,500 mg Per Tube BID   metoprolol tartrate  200 mg Per Tube BID   midodrine  5 mg Per Tube Q8H   mouth rinse  15 mL Mouth Rinse 4 times per day   scopolamine  1 patch Transdermal Q72H   valproic acid  500 mg Per Tube Q8H   Continuous Infusions:  sodium chloride 250 mL (12/13/22 0041)   feeding supplement (OSMOLITE 1.5 CAL) 65 mL/hr at 01/09/23 1500      LOS: 98 days   Alma Friendly  [facility]  01/09/2023, 6:49 PM

## 2023-01-09 NOTE — Progress Notes (Addendum)
11:50am: CSW spoke with [facility] who states the facility cannot accept the patient.  CSW spoke with Narda Rutherford at [facility] who states the facility is not in network with patient's insurance.  CSW spoke with Kia at [facility] who states the facility staff is able to provide trach care.  8:30am: CSW attempted to reach [place] at [facility] without success - a text message was sent requesting a requesting an update.  Madilyn Fireman, MSW, LCSW Transitions of Care  Clinical Social Worker II [phone]

## 2023-01-09 NOTE — Progress Notes (Signed)
Physical Therapy Treatment Patient Details Name: Damon [name] MRN: [id number] DOB: September 06, 1961 Today's Date: 01/09/2023   History of Present Illness PT is a 62 yo male admitted to [facility] with flu on 10-12-22. Pt coded 2023-10-13 and was intubated. Trach and PEG placed 11/01/22. MRI of brain revealed "Fairly symmetric diffusion-weighted and T2 FLAIR hyperintense  signal abnormality within the bilateral basal ganglia. Subtle signal  changes are also suspected within the bilateral hippocampi. Given  the provided history, these findings are compatible with acute  hypoxic/ischemic injury". PMH: HLD, HTN, back pain.    PT Comments    Patient alert on arrival and remained alert throughout session. Attempted to elicit responses from patient with no blink to threat, no withdrawal to painful stimuli, no response to loud noises or name called. All extremities flaccid except RLE with +extensor tone (elicited with left knee flexion even with repetition). Rt knee extension contracture only allows ~30 degrees of right knee flexion. Bil ankles maintaining dorsiflexion and no areas of pressure noted with pt wearing bil Prevalon boots. Fingers with some flexor tightness, however do not feel splinting is warranted with very poor prognosis for functional recovery (per neurosurgery). No family present for family education and RN not aware of when/if family visits. Unable to attempt sit EOB or lift OOB as previously planned due to lack of +2 assist. Will try to have evaluating therapist reassess pt at next visit when goal update vs discharge is due.     Recommendations for follow up therapy are one component of a multi-disciplinary discharge planning process, led by the attending physician.  Recommendations may be updated based on patient status, additional functional criteria and insurance authorization.  Follow Up Recommendations  Can patient physically be transported by private vehicle: No    Assistance Recommended at  Discharge Frequent or constant Supervision/Assistance  Patient can return home with the following Two people to help with walking and/or transfers;Two people to help with bathing/dressing/bathroom;Assistance with cooking/housework;Direct supervision/assist for medications management;Direct supervision/assist for financial management;Assistance with feeding;Assist for transportation;Help with stairs or ramp for entrance   Equipment Recommendations  Wheelchair (measurements PT);Wheelchair cushion (measurements PT);Hospital bed;Other (comment) (hoyer lift, air mattress)    Recommendations for Other Services       Precautions / Restrictions Precautions Precautions: Fall Precaution Comments: PEG (abdominal binder); Trach Restrictions Weight Bearing Restrictions: No     Mobility  Bed Mobility               General bed mobility comments: bed/pt placed in chair-like position with no change in VS or ability to respond to stimuli; +2 assist not available for EOB    Transfers                        Ambulation/Gait                   Stairs             Wheelchair Mobility    Modified Rankin (Stroke Patients Only)       Balance                                            Cognition Arousal/Alertness: Awake/alert Behavior During Therapy: Flat affect Overall Cognitive Status: Impaired/Different from baseline  General Comments: no grimacing noted throughout session; shifted his gaze from left to right x1 but not tracking (spontaneously); does not blink to threat left or right; VSS throughout with no change even to painful stimuli        Exercises Other Exercises Other Exercises: PROM UE/LE in supine; noted tightness/limited ROM bil hands and shoulders (fingers in flexion and hands placed with fingers extended on pillows at end of session); rt knee flexion limited to 30 degrees with full  extension; noted RLE extensor tone when left knee flexed otherwise no response to ROM    General Comments        Pertinent Vitals/Pain Pain Assessment Pain Assessment: Faces Faces Pain Scale: No hurt    Home Living                          Prior Function            PT Goals (current goals can now be found in the care plan section) Acute Rehab PT Goals Patient Stated Goal: unable PT Goal Formulation: Patient unable to participate in goal setting Time For Goal Achievement: 01/15/23 Potential to Achieve Goals: Poor Progress towards PT goals: Not progressing toward goals - comment (no response to stimuli except left knee flexion eliciting rt knee extension)    Frequency    Min 1X/week      PT Plan Current plan remains appropriate    Co-evaluation              AM-PAC PT "6 Clicks" Mobility   Outcome Measure  Help needed turning from your back to your side while in a flat bed without using bedrails?: Total Help needed moving from lying on your back to sitting on the side of a flat bed without using bedrails?: Total Help needed moving to and from a bed to a chair (including a wheelchair)?: Total Help needed standing up from a chair using your arms (e.g., wheelchair or bedside chair)?: Total Help needed to walk in hospital room?: Total Help needed climbing 3-5 steps with a railing? : Total 6 Click Score: 6    End of Session Equipment Utilized During Treatment: Oxygen Activity Tolerance: No increased pain;Patient tolerated treatment well Patient left: in bed;with SCD's reapplied   PT Visit Diagnosis: Other symptoms and signs involving the nervous system [id number])     Time: [id number] PT Time Calculation (min) (ACUTE ONLY): 17 min  Charges:  $Neuromuscular Re-education: 8-22 mins                      [place]  Office [phone]    Rexanne Mano 01/09/2023, 8:58 AM

## 2023-01-10 LAB — CBC WITH DIFFERENTIAL/PLATELET
Abs Immature Granulocytes: 0 10*3/uL (ref 0.00–0.07)
Basophils Absolute: 0.2 10*3/uL — ABNORMAL HIGH (ref 0.0–0.1)
Basophils Relative: 2 %
Eosinophils Absolute: 0.1 10*3/uL (ref 0.0–0.5)
Eosinophils Relative: 1 %
HCT: 36.1 % — ABNORMAL LOW (ref 39.0–52.0)
Hemoglobin: 11.7 g/dL — ABNORMAL LOW (ref 13.0–17.0)
Lymphocytes Relative: 30 %
Lymphs Abs: 2.8 10*3/uL (ref 0.7–4.0)
MCH: 28.5 pg (ref 26.0–34.0)
MCHC: 32.4 g/dL (ref 30.0–36.0)
MCV: 88 fL (ref 80.0–100.0)
Monocytes Absolute: 1.7 10*3/uL — ABNORMAL HIGH (ref 0.1–1.0)
Monocytes Relative: 19 %
Neutro Abs: 4.4 10*3/uL (ref 1.7–7.7)
Neutrophils Relative %: 48 %
Platelets: 281 10*3/uL (ref 150–400)
RBC: 4.1 MIL/uL — ABNORMAL LOW (ref 4.22–5.81)
RDW: 14.6 % (ref 11.5–15.5)
WBC: 9.2 10*3/uL (ref 4.0–10.5)
nRBC: 0 % (ref 0.0–0.2)
nRBC: 0 /100 WBC

## 2023-01-10 LAB — BASIC METABOLIC PANEL
Anion gap: 10 (ref 5–15)
BUN: 37 mg/dL — ABNORMAL HIGH (ref 6–20)
CO2: 25 mmol/L (ref 22–32)
Calcium: 9.4 mg/dL (ref 8.9–10.3)
Chloride: 104 mmol/L (ref 98–111)
Creatinine, Ser: 0.57 mg/dL — ABNORMAL LOW (ref 0.61–1.24)
GFR, Estimated: >60 mL/min (ref >60)
Glucose, Bld: 92 mg/dL (ref 70–99)
Potassium: 4.2 mmol/L (ref 3.5–5.1)
Sodium: 139 mmol/L (ref 135–145)

## 2023-01-10 LAB — GLUCOSE, CAPILLARY
Glucose-Capillary: 120 mg/dL — ABNORMAL HIGH (ref 70–99)
Glucose-Capillary: 135 mg/dL — ABNORMAL HIGH (ref 70–99)

## 2023-01-10 NOTE — Progress Notes (Addendum)
CSW spoke with Judeen Hammans at [facility] to request she review patient's clinicals - CSW sent secure email with clinicals attached for review.  Madilyn Fireman, MSW, LCSW Transitions of Care  Clinical Social Worker II [phone]

## 2023-01-10 NOTE — NC FL2 (Signed)
[place] LEVEL OF CARE FORM     IDENTIFICATION  Patient Name: Damon [name] Birthdate: April 09, 1961 Sex: male Admission Date (Current Location): 10/03/2022  [facility] and [place] Number:  Herbalist and Address:  [facility], [place] [address] [place], [place] [address]      Provider Number: [id number]  Attending Physician Name and Address:  Alma Friendly, MD  Relative Name and Phone Number:  [name] (Spouse) [phone]    Current Level of Care: Hospital Recommended Level of Care: [place] Prior Approval Number:    Date Approved/Denied:   PASRR Number: [id number]  Discharge Plan: SNF    Current Diagnoses: Patient Active Problem List   Diagnosis Date Noted   Malnutrition of moderate degree 12/14/2022   Pneumonia of right lower lobe due to Pseudomonas species ([place]) 12/12/2022   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   Tracheostomy status ([place]) 11/12/2022   Advanced care planning/counseling discussion 10/21/2022   On mechanically assisted ventilation ([place]) 10/17/2022   Anoxic brain damage ([place]) 10/17/2022   Anoxic encephalopathy ([place]) 10/16/2022   Seizure ([place]) 10/16/2022   Pressure injury of skin 10/16/2022   Acute hypoxic respiratory failure ([place]) 10/03/2022   CAP (community acquired pneumonia) 10/03/2022   Influenza and pneumonia 10/03/2022   Elevated LFTs 10/03/2022   HTN (hypertension) 10/03/2022   HLD (hyperlipidemia) 10/03/2022   Tobacco abuse 10/03/2022   SIRS (systemic inflammatory response syndrome) ([place]) 10/03/2022   Influenza A 10/03/2022   Cardiac arrest ([place]) 10/03/2022   Tobacco dependence 10/03/2022    Orientation RESPIRATION BLADDER Height & Weight      (Unable to assess)  Tracheostomy, O2 (6L via trach collar) Incontinent, External catheter Weight: 139 lb 1.8 oz (63.1 kg) Height:  5\' 4"  (162.6 cm)  BEHAVIORAL SYMPTOMS/MOOD NEUROLOGICAL BOWEL NUTRITION STATUS       Incontinent Diet, Feeding tube  AMBULATORY STATUS COMMUNICATION OF NEEDS Skin     Does not communicate Other (Comment) (Moisture associated skin damage - sacrum)                       Personal Care Assistance Level of Assistance  Bathing, Feeding, Dressing Bathing Assistance: Maximum assistance Feeding assistance: Maximum assistance Dressing Assistance: Maximum assistance     Functional Limitations Info  Sight, Hearing, Speech Sight Info: Adequate Hearing Info: Adequate Speech Info: Impaired Systems developer)    SPECIAL CARE FACTORS FREQUENCY  PT (By licensed PT), OT (By licensed OT)     PT Frequency: 3x weekly OT Frequency: 3x weekly            Contractures Contractures Info: Not present    Additional Factors Info  Code Status, Allergies Code Status Info: Full Code Allergies Info: No known allergies Psychotropic Info: levETIRAcetam (KEPPRA) 100 MG/ML solution 1,500 mg 2 times daily,propranolol (INDERAL) tablet 20 mg every 8 hours         Current Medications (01/10/2023):  This is the current hospital active medication list Current Facility-Administered Medications  Medication Dose Route Frequency Provider Last Rate Last Admin   0.9 %  sodium chloride infusion  250 mL Intravenous Continuous Meuth, Brooke A, PA-C 10 mL/hr at 12/13/22 0041 250 mL at 12/13/22 0041   acetaminophen (TYLENOL) 160 MG/5ML solution 650 mg  650 mg Per Tube Q6H PRN Meuth, Brooke A, PA-C   650 mg at 01/08/23 1940   Or   acetaminophen (TYLENOL) suppository 650 mg  650 mg Rectal Q6H PRN Meuth, Brooke A, PA-C  650 mg at 10/04/22 0820   Chlorhexidine Gluconate Cloth 2 % PADS 6 each  6 each Topical Daily Elodia Florence., MD   6 each at 01/10/23 0844   clonazePAM (KLONOPIN) tablet 0.5 mg  0.5 mg Per Tube BID Donnamae Jude, RPH   0.5 mg at 01/10/23 0844   diltiazem (CARDIZEM) tablet 60 mg  60 mg Per Tube Q6H Mariel Aloe, MD   60 mg at 01/10/23 0502   enoxaparin (LOVENOX) injection 40 mg  40 mg  Subcutaneous Q24H Meuth, Brooke A, PA-C   40 mg at 01/09/23 2143   famotidine (PEPCID) tablet 20 mg  20 mg Per Tube QHS Meuth, Brooke A, PA-C   20 mg at 01/09/23 2142   feeding supplement (OSMOLITE 1.5 CAL) liquid 1,000 mL  1,000 mL Per Tube Continuous Mariel Aloe, MD 65 mL/hr at 01/10/23 0257 1,000 mL at 01/10/23 0257   feeding supplement (PROSource TF20) liquid 60 mL  60 mL Per Tube BID Meuth, Brooke A, PA-C   60 mL at 01/10/23 0844   fiber supplement (BANATROL TF) liquid 60 mL  60 mL Per Tube BID Charlynne Cousins, MD   60 mL at 01/10/23 0844   free water 200 mL  200 mL Per Tube Q8H Charlynne Cousins, MD   200 mL at 01/10/23 0502   gabapentin (NEURONTIN) 250 MG/5ML solution 100 mg  100 mg Per Tube Q8H Meuth, Brooke A, PA-C   100 mg at 01/10/23 0501   guaiFENesin tablet 400 mg  400 mg Per Tube Q8H Howerter, Justin B, DO   400 mg at 01/10/23 0501   ipratropium-albuterol (DUONEB) 0.5-2.5 (3) MG/3ML nebulizer solution 3 mL  3 mL Nebulization Q6H PRN Meuth, Brooke A, PA-C       leptospermum manuka honey (MEDIHONEY) paste 1 Application  1 Application Topical QHS Margaretha Seeds, MD   1 Application at [phone]   levETIRAcetam (KEPPRA) 100 MG/ML solution 1,500 mg  1,500 mg Per Tube BID Meuth, Brooke A, PA-C   1,500 mg at 01/10/23 0844   lip balm (CARMEX) ointment   Topical PRN Bonnielee Haff, MD   75 Application at [id number] [phone]   metoprolol tartrate (LOPRESSOR) injection 5 mg  5 mg Intravenous Q3H PRN Charlynne Cousins, MD   5 mg at 01/08/23 0648   metoprolol tartrate (LOPRESSOR) tablet 200 mg  200 mg Per Tube BID Charlynne Cousins, MD   200 mg at 01/10/23 0844   midodrine (PROAMATINE) tablet 5 mg  5 mg Per Tube Sena Slate, MD   5 mg at 01/10/23 0502   ondansetron (ZOFRAN) tablet 4 mg  4 mg Per Tube Q6H PRN Mariel Aloe, MD       Or   ondansetron (ZOFRAN) injection 4 mg  4 mg Intravenous Q6H PRN Mariel Aloe, MD       Oral care mouth rinse  15 mL Mouth Rinse 4  times per day Elodia Florence., MD   15 mL at 01/10/23 0845   Oral care mouth rinse  15 mL Mouth Rinse PRN Elodia Florence., MD       oxyCODONE (Oxy IR/ROXICODONE) immediate release tablet 5 mg  5 mg Per Tube Q4H PRN Meuth, Brooke A, PA-C   5 mg at 01/08/23 1940   scopolamine (TRANSDERM-SCOP) 1 MG/3DAYS 1.5 mg  1 patch Transdermal Q72H Jacky Kindle, MD   1.5 mg at 01/09/23 0908   valproic  acid (DEPAKENE) 250 MG/5ML solution 500 mg  500 mg Per Tube Q8H Meuth, Brooke A, PA-C   500 mg at 01/10/23 0501     Discharge Medications: Please see discharge summary for a list of discharge medications.  Relevant Imaging Results:  Relevant Lab Results:   Additional Information SSN: [id number]  Archie Endo, LCSW

## 2023-01-10 NOTE — Progress Notes (Signed)
[facility] PROGRESS NOTE    Progress Note  Damon [name]  [id number] DOB: 03/20/1961 DOA: 10/03/2022 PCP: [facility]     Brief Narrative:   Damon [name] is an 62 y.o. male past medical history of hypertension, hyperlipidemia tobacco abuse, chronic back pain comes into the [name] on 10/03/2022 for influenza A protracted hospitalization  Significant Events: 12/18 Flu A positive 12/20 Admitted, bipap, 5 minute code 12/21 Intubated 12/26 Changed to DNR. MRI brain with hypoxic/anoxic injury 12/28 moved to [name] for LTM EEG 12/31 family updated by neuro: no chance for meaningful recovery, they should make plans for comfort measures 1/8 No significant changes in neuro status 1/16-spoke with wife at length at bedside, Dr. Hulen Skains also had a long conversation with spouse-desires to continue current aggressive care and desires PEG and trach placed 1/18 perc trach and peg 11/05/2022 transferred to floor 11/08/2022 transition to [facility] with pulmonary following weekly for trach 2/4 recurrent fever, cultures collected. 2/5 changed to cuffless trach 2/18 antibiotics started for Pseudomonas on tracheal aspirate cultre 2/22 concern for myoclonic seizure, neuro reconsulted 2/26 transferred to ICU for secretions.  3/1-3/3: Respiratory status has improved.  Completed 10 days of Zosyn. Okay for transfer back to progressive floor.  Assessment/Plan:   Goals of care IPAL from 11/07/2022, he has been DNR but transitioned back to full code. Remains full code persistent discussion with spouse by different providers have been unsuccessful. Wife has unrealistic expectations  Anoxic brain injury/myoclonic seizure activity: Neurology had a long discussion with the family on 10/14/2022 that she has sustained irreversible anoxic brain injury that is catastrophic, family was encouraged to move towards comfort measures EEG on 11/16/2022 show myoclonic  seizures Patient is currently on Keppra, Depakote and clonazepam Patient has had no neurological improvement in the last 4 weeks.  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  Pulmonary following on a weekly basis tracheostomy was exchanged on 12/13/2022. Has been experiencing significant secretions requiring suctioning  Pseudomonas pneumonia versus tracheitis: Culture on 12/01/2022 Pseudomonas resistant to imipenem. She completed 10-day course of Zosyn on 12/16/2022.  He also received a few days of levofloxacin. Patient is an tobramycin nebulizer treatment.  PEA arrest  Recurrent fever/leukocytosis: Has had intermittent fevers throughout [name] stay, received multiple courses of antibiotics. He has completed courses of antibiotics and antifungal last day on 12/18/2022.  Paroxysmal sympathetic hyperactivity/sinus tachycardia Continue metoprolol 200 mg twice a day plus diltiazem low-dose. Continue IV metoprolol as needed for heart rate greater than 110. Heart rate is hard to control will have low threshold for IV metoprolol.  Continue Neurontin.  TSH unremarkable. Echocardiogram shows a low EF without significant valvular abnormalities.  Constipation Laxatives were held due to frequent bowel movements.  Hypotension Continue midodrine blood pressure stable.  Severe protein caloric malnutrition: Continue tube feedings, PEG tube placed on 11/01/2022.  Prediabetes mellitus: A1c of 5.9 not requiring sliding scale for several days continue CBGs check  Sacral decubitus ulcer stage II RN Pressure Injury Documentation:     DVT prophylaxis: lovenox Family Communication:  none at bedside Status is: Inpatient Remains inpatient appropriate because: Devastating anoxic brain injury.    Code Status:     Code Status Orders   (From admission, onward)           Start     Ordered   11/07/22 1116  Full  code  Continuous       Question:  By:  Answer:  Consent: discussion documented in EHR   11/07/22 1115           Code Status History     Date Active Date Inactive Code Status Order ID Comments User Context   10/09/2022 1851 11/07/2022 1115 DNR [id number]  Garner Nash, DO Inpatient   10/03/2022 0808 10/09/2022 1850 Full Code [id number]  Jonnie Finner, DO [name]         IV Access:   Peripheral IV   Procedures and diagnostic studies:   No results found.   Medical Consultants:   None.   Subjective:    [name] non verbal  Objective:    Vitals:   01/10/23 0856 01/10/23 1134 01/10/23 1150 01/10/23 1524  BP: (!) 130/93  115/89 108/84  Pulse: (!) 120  (!) 107 (!) 101  Resp: 18 18 (!) 22 (!) 24  Temp:   99.7 F (37.6 C) 99.8 F (37.7 C)  TempSrc:   Axillary Axillary  SpO2: 98%  100% 98%  Weight:      Height:       SpO2: 98 % O2 Flow Rate (L/min): 6 L/min FiO2 (%): 21 %   Intake/Output Summary (Last 24 hours) at 01/10/2023 1656 Last data filed at 01/10/2023 1145 Gross per 24 hour  Intake --  Output 1100 ml  Net -1100 ml   Filed Weights   01/07/23 0500 01/08/23 0500 01/09/23 0500  Weight: 63.1 kg 63.1 kg 63.1 kg    Exam: General: NAD  Cardiovascular: S1, S2 present Respiratory: Coarse breath sound b/l Abdomen: Soft, nontender, nondistended, bowel sounds present Musculoskeletal: No bilateral pedal edema noted Skin: Decubitus ulcer Psychiatry: Unable to assess  Data Reviewed:    Labs: Basic Metabolic Panel: Recent Labs  Lab 01/10/23 0333  NA 139  K 4.2  CL 104  CO2 25  GLUCOSE 92  BUN 37*  CREATININE 0.57*  CALCIUM 9.4    GFR Estimated Creatinine Clearance: 82.2 mL/min (A) (by C-G formula based on SCr of 0.57 mg/dL (L)). Liver Function Tests: No results for input(s): "AST", "ALT", "ALKPHOS", "BILITOT", "PROT", "ALBUMIN" in the last 168  hours.  No results for input(s): "LIPASE", "AMYLASE" in the last 168 hours. No results for input(s): "AMMONIA" in the last 168 hours. Coagulation profile No results for input(s): "INR", "PROTIME" in the last 168 hours. COVID-19 Labs  No results for input(s): "DDIMER", "FERRITIN", "LDH", "CRP" in the last 72 hours.  Lab Results  Component Value Date   [place] NEGATIVE 10/01/2022    CBC: Recent Labs  Lab 01/10/23 0333  WBC 9.2  NEUTROABS 4.4  HGB 11.7*  HCT 36.1*  MCV 88.0  PLT 281    Cardiac Enzymes: No results for input(s): "CKTOTAL", "CKMB", "CKMBINDEX", "TROPONINI" in the last 168 hours. BNP (last 3 results) No results for input(s): "PROBNP" in the last 8760 hours. CBG: Recent Labs  Lab 01/09/23 0758 01/09/23 1520 01/09/23 2328 01/10/23 0759 01/10/23 1523  GLUCAP 125* 111* 123* 120* 135*   D-Dimer: No results for input(s): "DDIMER" in the last 72 hours. Hgb A1c: No results for input(s): "HGBA1C" in the last 72 hours. Lipid Profile: No results for input(s): "CHOL", "HDL", "LDLCALC", "TRIG", "CHOLHDL", "LDLDIRECT" in the last 72 hours. Thyroid function studies: No results for input(s): "TSH", "T4TOTAL", "T3FREE", "THYROIDAB" in the last 72 hours.  Invalid input(s): "FREET3"  Anemia work up: No results for input(s): "VITAMINB12", "FOLATE", "FERRITIN", "TIBC", "IRON", "  RETICCTPCT" in the last 72 hours. Sepsis Labs: Recent Labs  Lab 01/10/23 0333  WBC 9.2    Microbiology No results found for this or any previous visit (from the past 240 hour(s)).    Medications:    Chlorhexidine Gluconate Cloth  6 each Topical Daily   clonazePAM  0.5 mg Per Tube BID   diltiazem  60 mg Per Tube Q6H   enoxaparin (LOVENOX) injection  40 mg Subcutaneous Q24H   famotidine  20 mg Per Tube QHS   feeding supplement (PROSource TF20)  60 mL Per Tube BID   fiber supplement (BANATROL TF)  60 mL Per Tube BID   free water  200 mL Per Tube Q8H   gabapentin  100 mg Per  Tube Q8H   guaiFENesin  400 mg Per Tube Q8H   leptospermum manuka honey  1 Application Topical QHS   levETIRAcetam  1,500 mg Per Tube BID   metoprolol tartrate  200 mg Per Tube BID   midodrine  5 mg Per Tube Q8H   mouth rinse  15 mL Mouth Rinse 4 times per day   scopolamine  1 patch Transdermal Q72H   valproic acid  500 mg Per Tube Q8H   Continuous Infusions:  sodium chloride 250 mL (12/13/22 0041)   feeding supplement (OSMOLITE 1.5 CAL) 1,000 mL (01/10/23 0257)      LOS: 99 days   Alma Friendly  [facility]  01/10/2023, 4:56 PM

## 2023-01-11 LAB — GLUCOSE, CAPILLARY
Glucose-Capillary: 109 mg/dL — ABNORMAL HIGH (ref 70–99)
Glucose-Capillary: 135 mg/dL — ABNORMAL HIGH (ref 70–99)
Glucose-Capillary: 117 mg/dL — ABNORMAL HIGH (ref 70–99)
Glucose-Capillary: 104 mg/dL — ABNORMAL HIGH (ref 70–99)

## 2023-01-11 NOTE — Progress Notes (Signed)
[facility] PROGRESS NOTE    Progress Note  Damon [name]  [id number] DOB: 03/20/1961 DOA: 10/03/2022 PCP: [facility]     Brief Narrative:   Damon [name] is an 62 y.o. male past medical history of hypertension, hyperlipidemia tobacco abuse, chronic back pain comes into the [place] [facility] ED on 10/03/2022 for influenza A protracted hospitalization  Significant Events: 12/18 Flu A positive 12/20 Admitted, bipap, 5 minute code 12/21 Intubated 12/26 Changed to DNR. MRI brain with hypoxic/anoxic injury 12/28 moved to [facility] for LTM EEG 12/31 family updated by neuro: no chance for meaningful recovery, they should make plans for comfort measures 1/8 No significant changes in neuro status 1/16-spoke with wife at length at bedside, Dr. Hulen Skains also had a long conversation with spouse-desires to continue current aggressive care and desires PEG and trach placed 1/18 perc trach and peg 11/05/2022 transferred to floor 11/08/2022 transition to [facility] with pulmonary following weekly for trach 2/4 recurrent fever, cultures collected. 2/5 changed to cuffless trach 2/18 antibiotics started for Pseudomonas on tracheal aspirate cultre 2/22 concern for myoclonic seizure, neuro reconsulted 2/26 transferred to ICU for secretions.  3/1-3/3: Respiratory status has improved.  Completed 10 days of Zosyn. Okay for transfer back to progressive floor.  Assessment/Plan:   Goals of care IPAL from 11/07/2022, he has been DNR but transitioned back to full code. Remains full code persistent discussion with spouse by different providers have been unsuccessful. Wife has unrealistic expectations  Anoxic brain injury/myoclonic seizure activity: Neurology had a long discussion with the family on 10/14/2022 that she has sustained irreversible anoxic brain injury that is catastrophic, family was encouraged to move towards comfort measures EEG on 11/16/2022 show myoclonic  seizures Patient is currently on Keppra, Depakote and clonazepam Patient has had no neurological improvement in the last 4 weeks.  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  Pulmonary following on a weekly basis tracheostomy was exchanged on 12/13/2022. Has been experiencing significant secretions requiring suctioning  Pseudomonas pneumonia versus tracheitis: Culture on 12/01/2022 Pseudomonas resistant to imipenem. She completed 10-day course of Zosyn on 12/16/2022.  He also received a few days of levofloxacin. Patient is an tobramycin nebulizer treatment.  PEA arrest  Recurrent fever/leukocytosis: Has had intermittent fevers throughout hospital stay, received multiple courses of antibiotics. He has completed courses of antibiotics and antifungal last day on 12/18/2022.  Paroxysmal sympathetic hyperactivity/sinus tachycardia Continue metoprolol 200 mg twice a day plus diltiazem low-dose. Continue IV metoprolol as needed for heart rate greater than 110. Heart rate is hard to control will have low threshold for IV metoprolol.  Continue Neurontin.  TSH unremarkable. Echocardiogram shows a low EF without significant valvular abnormalities.  Constipation Laxatives were held due to frequent bowel movements.  Hypotension Continue midodrine blood pressure stable.  Severe protein caloric malnutrition: Continue tube feedings, PEG tube placed on 11/01/2022.  Prediabetes mellitus: A1c of 5.9 not requiring sliding scale for several days continue CBGs check  Sacral decubitus ulcer stage II RN Pressure Injury Documentation:     DVT prophylaxis: lovenox Family Communication:  none at bedside Status is: Inpatient Remains inpatient appropriate because: Devastating anoxic brain injury.    Code Status:     Code Status Orders   (From admission, onward)           Start     Ordered   11/07/22 1116  Full  code  Continuous       Question:  By:  Answer:  Consent: discussion documented in EHR   11/07/22 1115           Code Status History     Date Active Date Inactive Code Status Order ID Comments User Context   10/09/2022 1851 11/07/2022 1115 DNR [id number]  Garner Nash, DO Inpatient   10/03/2022 0808 10/09/2022 1850 Full Code [id number]  Jonnie Finner, DO ED         IV Access:   Peripheral IV   Procedures and diagnostic studies:   No results found.   Medical Consultants:   None.   Subjective:    [name] non verbal  Objective:    Vitals:   01/11/23 0845 01/11/23 1053 01/11/23 1102 01/11/23 1550  BP:   (!) 117/93 102/71  Pulse: 96  99 (!) 102  Resp: (!) 24  20 20   Temp:   98 F (36.7 C) 99.3 F (37.4 C)  TempSrc:   Axillary Axillary  SpO2: 96% 95% 99% 94%  Weight:      Height:       SpO2: 94 % O2 Flow Rate (L/min): 6 L/min FiO2 (%): 21 %   Intake/Output Summary (Last 24 hours) at 01/11/2023 1851 Last data filed at 01/11/2023 1600 Gross per 24 hour  Intake --  Output 1200 ml  Net -1200 ml   Filed Weights   01/07/23 0500 01/08/23 0500 01/09/23 0500  Weight: 63.1 kg 63.1 kg 63.1 kg    Exam: General: NAD  Cardiovascular: S1, S2 present Respiratory: Coarse breath sound b/l, noted abundant tracheal secretion Abdomen: Soft, nontender, nondistended, bowel sounds present Musculoskeletal: No bilateral pedal edema noted Skin: Decubitus ulcer Psychiatry: Unable to assess  Data Reviewed:    Labs: Basic Metabolic Panel: Recent Labs  Lab 01/10/23 0333  NA 139  K 4.2  CL 104  CO2 25  GLUCOSE 92  BUN 37*  CREATININE 0.57*  CALCIUM 9.4    GFR Estimated Creatinine Clearance: 82.2 mL/min (A) (by C-G formula based on SCr of 0.57 mg/dL (L)). Liver Function Tests: No results for input(s): "AST", "ALT", "ALKPHOS", "BILITOT", "PROT", "ALBUMIN" in the last  168 hours.  No results for input(s): "LIPASE", "AMYLASE" in the last 168 hours. No results for input(s): "AMMONIA" in the last 168 hours. Coagulation profile No results for input(s): "INR", "PROTIME" in the last 168 hours. COVID-19 Labs  No results for input(s): "DDIMER", "FERRITIN", "LDH", "CRP" in the last 72 hours.  Lab Results  Component Value Date   [place] NEGATIVE 10/01/2022    CBC: Recent Labs  Lab 01/10/23 0333  WBC 9.2  NEUTROABS 4.4  HGB 11.7*  HCT 36.1*  MCV 88.0  PLT 281    Cardiac Enzymes: No results for input(s): "CKTOTAL", "CKMB", "CKMBINDEX", "TROPONINI" in the last 168 hours. BNP (last 3 results) No results for input(s): "PROBNP" in the last 8760 hours. CBG: Recent Labs  Lab 01/10/23 0759 01/10/23 1523 01/10/23 2333 01/11/23 0811 01/11/23 1553  GLUCAP 120* 135* 109* 135* 117*   D-Dimer: No results for input(s): "DDIMER" in the last 72 hours. Hgb A1c: No results for input(s): "HGBA1C" in the last 72 hours. Lipid Profile: No results for input(s): "CHOL", "HDL", "LDLCALC", "TRIG", "CHOLHDL", "LDLDIRECT" in the last 72 hours. Thyroid function studies: No results for input(s): "TSH", "T4TOTAL", "T3FREE", "THYROIDAB" in the last 72 hours.  Invalid input(s): "FREET3"  Anemia work up: No results for input(s): "VITAMINB12", "FOLATE", "FERRITIN", "TIBC", "  IRON", "RETICCTPCT" in the last 72 hours. Sepsis Labs: Recent Labs  Lab 01/10/23 0333  WBC 9.2    Microbiology No results found for this or any previous visit (from the past 240 hour(s)).    Medications:    Chlorhexidine Gluconate Cloth  6 each Topical Daily   clonazePAM  0.5 mg Per Tube BID   diltiazem  60 mg Per Tube Q6H   enoxaparin (LOVENOX) injection  40 mg Subcutaneous Q24H   famotidine  20 mg Per Tube QHS   feeding supplement (PROSource TF20)  60 mL Per Tube BID   fiber supplement (BANATROL TF)  60 mL Per Tube BID   free water  200 mL Per Tube Q8H   gabapentin  100 mg Per  Tube Q8H   guaiFENesin  400 mg Per Tube Q8H   leptospermum manuka honey  1 Application Topical QHS   levETIRAcetam  1,500 mg Per Tube BID   metoprolol tartrate  200 mg Per Tube BID   midodrine  5 mg Per Tube Q8H   mouth rinse  15 mL Mouth Rinse 4 times per day   scopolamine  1 patch Transdermal Q72H   valproic acid  500 mg Per Tube Q8H   Continuous Infusions:  sodium chloride 250 mL (12/13/22 0041)   feeding supplement (OSMOLITE 1.5 CAL) 1,000 mL (01/11/23 1241)      LOS: 100 days   Alma Friendly  [facility]  01/11/2023, 6:51 PM

## 2023-01-12 LAB — GLUCOSE, CAPILLARY
Glucose-Capillary: 126 mg/dL — ABNORMAL HIGH (ref 70–99)
Glucose-Capillary: 132 mg/dL — ABNORMAL HIGH (ref 70–99)
Glucose-Capillary: 137 mg/dL — ABNORMAL HIGH (ref 70–99)

## 2023-01-12 NOTE — Progress Notes (Signed)
[facility] PROGRESS NOTE    Progress Note  Damon [name]  [id number] DOB: April 09, 1961 DOA: 10/03/2022 PCP: [facility]     Brief Narrative:   Damon [name] is an 62 y.o. male past medical history of hypertension, hyperlipidemia tobacco abuse, chronic back pain comes into the [facility] ED on 10/03/2022 for influenza A protracted hospitalization  Significant Events: 12/18 Flu A positive 12/20 Admitted, bipap, 5 minute code 12/21 Intubated 12/26 MRI brain with hypoxic/anoxic injury 12/28 moved to [facility] for LTM EEG 12/31 family updated by neuro: no chance for meaningful recovery, advised comfort measures 1/8 No significant changes in neuro status 1/16-spoke with wife at length at bedside, Dr. Hulen Skains also had a long conversation with spouse-desires to continue current aggressive care and desires PEG and trach placed 1/18 perc trach and peg 11/05/2022 transferred to floor 11/08/2022 transition to [facility] with pulmonary following weekly for trach 2/4 recurrent fever, cultures collected. 2/5 changed to cuffless trach 2/18 antibiotics started for Pseudomonas on tracheal aspirate cultre 2/22 concern for myoclonic seizure, neuro reconsulted 2/26 transferred to ICU for secretions.  3/1-3/3: Respiratory stable.  Completed 10 days of Zosyn. Okay for transfer back to progressive floor.  Assessment/Plan:   Goals of care IPAL from 11/07/2022, he has been DNR but transitioned back to full code. Remains full code persistent discussion with spouse by different providers have been unsuccessful. Wife has unrealistic expectations  Anoxic brain injury/myoclonic seizure activity: Neurology had a long discussion with the family on 10/14/2022 that she has sustained irreversible anoxic brain injury that is catastrophic, family was encouraged to move towards comfort measures EEG on 11/16/2022 show myoclonic seizures Patient is currently on Keppra, Depakote and  clonazepam Patient has had no neurological improvement in the last 4 weeks.  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  Pulmonary following on a weekly basis tracheostomy was exchanged on 12/13/2022. Has been experiencing significant secretions requiring suctioning  Pseudomonas pneumonia versus tracheitis: Culture on 12/01/2022 Pseudomonas resistant to imipenem. She completed 10-day course of Zosyn on 12/16/2022.  He also received a few days of levofloxacin. Patient is an tobramycin nebulizer treatment.  PEA arrest  Recurrent fever/leukocytosis: Has had intermittent fevers throughout hospital stay, received multiple courses of antibiotics. He has completed courses of antibiotics and antifungal last day on 12/18/2022.  Paroxysmal sympathetic hyperactivity/sinus tachycardia Continue metoprolol 200 mg twice a day plus diltiazem low-dose. Continue IV metoprolol as needed for heart rate greater than 110. Heart rate is hard to control will have low threshold for IV metoprolol.  Continue Neurontin.  TSH unremarkable. Echocardiogram shows a low EF without significant valvular abnormalities.  Constipation Laxatives were held due to frequent bowel movements.  Hypotension Continue midodrine blood pressure stable.  Severe protein caloric malnutrition: Continue tube feedings, PEG tube placed on 11/01/2022.  Prediabetes mellitus: A1c of 5.9 not requiring sliding scale for several days continue CBGs check  Sacral decubitus ulcer stage II RN Pressure Injury Documentation:     DVT prophylaxis: lovenox Family Communication:  none at bedside Status is: Inpatient Remains inpatient appropriate because: Devastating anoxic brain injury.    Code Status:     Code Status Orders  (From admission, onward)           Start      Ordered   11/07/22 1116  Full code  Continuous  Question:  By:  Answer:  Consent: discussion documented in EHR   11/07/22 1115           Code Status History     Date Active Date Inactive Code Status Order ID Comments User Context   10/09/2022 1851 11/07/2022 1115 DNR [id number]  Garner Nash, DO Inpatient   10/03/2022 0808 10/09/2022 1850 Full Code [id number]  Jonnie Finner, DO ED         IV Access:   Peripheral IV   Procedures and diagnostic studies:   No results found.   Medical Consultants:   None.   Subjective:    Damon [name] non verbal, continues to have lots of tracheal secretions   Objective:    Vitals:   01/12/23 0519 01/12/23 0716 01/12/23 0819 01/12/23 1200  BP: (!) 118/91 117/81  104/85  Pulse:  99 (!) 103   Resp:  (!) 25 (!) 29   Temp:  99.2 F (37.3 C)  98.4 F (36.9 C)  TempSrc:  Axillary  Axillary  SpO2:  95% 98%   Weight:      Height:       SpO2: 98 % O2 Flow Rate (L/min): 6 L/min FiO2 (%): 21 %   Intake/Output Summary (Last 24 hours) at 01/12/2023 1440 Last data filed at 01/12/2023 [id number] Gross per 24 hour  Intake 4100 ml  Output 1300 ml  Net 2800 ml   Filed Weights   01/07/23 0500 01/08/23 0500 01/09/23 0500  Weight: 63.1 kg 63.1 kg 63.1 kg    Exam: General: NAD  Cardiovascular: S1, S2 present Respiratory: Coarse breath sound b/l, noted abundant tracheal secretion Abdomen: Soft, nontender, nondistended, bowel sounds present Musculoskeletal: No bilateral pedal edema noted Skin: Decubitus ulcer Psychiatry: Unable to assess  Data Reviewed:    Labs: Basic Metabolic Panel: Recent Labs  Lab 01/10/23 0333  NA 139  K 4.2  CL 104  CO2 25  GLUCOSE 92  BUN 37*  CREATININE 0.57*  CALCIUM 9.4    GFR Estimated Creatinine Clearance: 82.2 mL/min (A) (by C-G formula based on SCr of 0.57 mg/dL (L)). Liver Function Tests: No results for input(s): "AST", "ALT", "ALKPHOS", "BILITOT", "PROT", "ALBUMIN" in the  last 168 hours.  No results for input(s): "LIPASE", "AMYLASE" in the last 168 hours. No results for input(s): "AMMONIA" in the last 168 hours. Coagulation profile No results for input(s): "INR", "PROTIME" in the last 168 hours. COVID-19 Labs  No results for input(s): "DDIMER", "FERRITIN", "LDH", "CRP" in the last 72 hours.  Lab Results  Component Value Date   [place] NEGATIVE 10/01/2022    CBC: Recent Labs  Lab 01/10/23 0333  WBC 9.2  NEUTROABS 4.4  HGB 11.7*  HCT 36.1*  MCV 88.0  PLT 281    Cardiac Enzymes: No results for input(s): "CKTOTAL", "CKMB", "CKMBINDEX", "TROPONINI" in the last 168 hours. BNP (last 3 results) No results for input(s): "PROBNP" in the last 8760 hours. CBG: Recent Labs  Lab 01/11/23 0811 01/11/23 1553 01/11/23 2313 01/12/23 0750 01/12/23 1232  GLUCAP 135* 117* 104* 126* 137*   D-Dimer: No results for input(s): "DDIMER" in the last 72 hours. Hgb A1c: No results for input(s): "HGBA1C" in the last 72 hours. Lipid Profile: No results for input(s): "CHOL", "HDL", "LDLCALC", "TRIG", "CHOLHDL", "LDLDIRECT" in the last 72 hours. Thyroid function studies: No results for input(s): "TSH", "T4TOTAL", "T3FREE", "THYROIDAB" in the last 72 hours.  Invalid input(s): "FREET3"  Anemia work up: No results for input(s): "VITAMINB12", "FOLATE", "FERRITIN", "  TIBC", "IRON", "RETICCTPCT" in the last 72 hours. Sepsis Labs: Recent Labs  Lab 01/10/23 0333  WBC 9.2    Microbiology No results found for this or any previous visit (from the past 240 hour(s)).    Medications:    Chlorhexidine Gluconate Cloth  6 each Topical Daily   clonazePAM  0.5 mg Per Tube BID   diltiazem  60 mg Per Tube Q6H   enoxaparin (LOVENOX) injection  40 mg Subcutaneous Q24H   famotidine  20 mg Per Tube QHS   feeding supplement (PROSource TF20)  60 mL Per Tube BID   fiber supplement (BANATROL TF)  60 mL Per Tube BID   free water  200 mL Per Tube Q8H   gabapentin  100  mg Per Tube Q8H   guaiFENesin  400 mg Per Tube Q8H   leptospermum manuka honey  1 Application Topical QHS   levETIRAcetam  1,500 mg Per Tube BID   metoprolol tartrate  200 mg Per Tube BID   midodrine  5 mg Per Tube Q8H   mouth rinse  15 mL Mouth Rinse 4 times per day   scopolamine  1 patch Transdermal Q72H   valproic acid  500 mg Per Tube Q8H   Continuous Infusions:  sodium chloride 250 mL (12/13/22 0041)   feeding supplement (OSMOLITE 1.5 CAL) 1,000 mL (01/12/23 0254)      LOS: 101 days   Alma Friendly  [facility]  01/12/2023, 2:40 PM

## 2023-01-13 LAB — GLUCOSE, CAPILLARY
Glucose-Capillary: 126 mg/dL — ABNORMAL HIGH (ref 70–99)
Glucose-Capillary: 145 mg/dL — ABNORMAL HIGH (ref 70–99)

## 2023-01-13 NOTE — Progress Notes (Signed)
[facility] PROGRESS NOTE    Progress Note  Damon [name]  [id number] DOB: May 06, 1961 DOA: 10/03/2022 PCP: [facility]     Brief Narrative:   Damon [name] is an 62 y.o. male past medical history of hypertension, hyperlipidemia tobacco abuse, chronic back pain comes into the [place] [facility] long ED on 10/03/2022 for influenza A protracted hospitalization  Significant Events: 12/18 Flu A positive 12/20 Admitted, bipap, 5 minute code 12/21 Intubated 12/26 MRI brain with hypoxic/anoxic injury 12/28 moved to [facility] for LTM EEG 12/31 family updated by neuro: no chance for meaningful recovery, advised comfort measures 1/8 No significant changes in neuro status 1/16-spoke with wife at length at bedside, Dr. Hulen Skains also had a long conversation with spouse-desires to continue current aggressive care and desires PEG and trach placed 1/18 perc trach and peg 11/05/2022 transferred to floor 11/08/2022 transition to [facility] with pulmonary following weekly for trach 2/4 recurrent fever, cultures collected. 2/5 changed to cuffless trach 2/18 antibiotics started for Pseudomonas on tracheal aspirate cultre 2/22 concern for myoclonic seizure, neuro reconsulted 2/26 transferred to ICU for secretions.  3/1-3/3: Respiratory stable.  Completed 10 days of Zosyn. Okay for transfer back to progressive floor.  Assessment/Plan:   Goals of care IPAL from 11/07/2022, he has been DNR but transitioned back to full code. Remains full code persistent discussion with spouse by different providers have been unsuccessful. Wife has unrealistic expectations  Anoxic brain injury/myoclonic seizure activity: Neurology had a long discussion with the family on 10/14/2022 that she has sustained irreversible anoxic brain injury that is catastrophic, family was encouraged to move towards comfort measures EEG on 11/16/2022 show myoclonic seizures Patient is currently on Keppra, Depakote and  clonazepam Patient has had no neurological improvement in the last 4 weeks.  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  Pulmonary following on a weekly basis tracheostomy was exchanged on 12/13/2022. Has been experiencing significant secretions requiring suctioning  Pseudomonas pneumonia versus tracheitis: Culture on 12/01/2022 Pseudomonas resistant to imipenem. She completed 10-day course of Zosyn on 12/16/2022.  He also received a few days of levofloxacin. Patient is an tobramycin nebulizer treatment.  PEA arrest  Recurrent fever/leukocytosis: Has had intermittent fevers throughout hospital stay, received multiple courses of antibiotics. He has completed courses of antibiotics and antifungal last day on 12/18/2022.  Paroxysmal sympathetic hyperactivity/sinus tachycardia Continue metoprolol 200 mg twice a day plus diltiazem low-dose. Continue IV metoprolol as needed for heart rate greater than 110. Heart rate is hard to control will have low threshold for IV metoprolol.  Continue Neurontin.  TSH unremarkable. Echocardiogram shows a low EF without significant valvular abnormalities.  Constipation Laxatives were held due to frequent bowel movements.  Hypotension Continue midodrine blood pressure stable.  Severe protein caloric malnutrition: Continue tube feedings, PEG tube placed on 11/01/2022.  Prediabetes mellitus: A1c of 5.9 not requiring sliding scale for several days continue CBGs check  Sacral decubitus ulcer stage II RN Pressure Injury Documentation:     DVT prophylaxis: lovenox Family Communication:  none at bedside Status is: Inpatient Remains inpatient appropriate because: Devastating anoxic brain injury.    Code Status:     Code Status Orders  (From admission, onward)           Start      Ordered   11/07/22 1116  Full code  Continuous  Question:  By:  Answer:  Consent: discussion documented in EHR   11/07/22 1115           Code Status History     Date Active Date Inactive Code Status Order ID Comments User Context   10/09/2022 1851 11/07/2022 1115 DNR [id number]  Garner Nash, DO Inpatient   10/03/2022 0808 10/09/2022 1850 Full Code [id number]  Jonnie Finner, DO ED         IV Access:   Peripheral IV   Procedures and diagnostic studies:   No results found.   Medical Consultants:   None.   Subjective:    Damon [name] non verbal   Objective:    Vitals:   01/12/23 2310 01/13/23 0417 01/13/23 0814 01/13/23 1252  BP: 114/89  (!) 115/92 98/72  Pulse:   (!) 126   Resp: 17 16 20    Temp: 98.5 F (36.9 C)  99.9 F (37.7 C)   TempSrc: Axillary  Axillary   SpO2: 98% 98% 98%   Weight:      Height:       SpO2: 98 % O2 Flow Rate (L/min): 6 L/min FiO2 (%): 21 %   Intake/Output Summary (Last 24 hours) at 01/13/2023 1600 Last data filed at 01/13/2023 1018 Gross per 24 hour  Intake 2034.5 ml  Output --  Net 2034.5 ml   Filed Weights   01/07/23 0500 01/08/23 0500 01/09/23 0500  Weight: 63.1 kg 63.1 kg 63.1 kg    Exam: General: NAD  Cardiovascular: S1, S2 present Respiratory: Coarse breath sound b/l, noted abundant tracheal secretion Abdomen: Soft, nontender, nondistended, bowel sounds present Musculoskeletal: No bilateral pedal edema noted Skin: Decubitus ulcer Psychiatry: Unable to assess  Data Reviewed:    Labs: Basic Metabolic Panel: Recent Labs  Lab 01/10/23 0333  NA 139  K 4.2  CL 104  CO2 25  GLUCOSE 92  BUN 37*  CREATININE 0.57*  CALCIUM 9.4    GFR Estimated Creatinine Clearance: 82.2 mL/min (A) (by C-G formula based on SCr of 0.57 mg/dL (L)). Liver Function Tests: No results for input(s): "AST", "ALT", "ALKPHOS", "BILITOT", "PROT", "ALBUMIN" in the last 168 hours.  No results for input(s): "LIPASE",  "AMYLASE" in the last 168 hours. No results for input(s): "AMMONIA" in the last 168 hours. Coagulation profile No results for input(s): "INR", "PROTIME" in the last 168 hours. COVID-19 Labs  No results for input(s): "DDIMER", "FERRITIN", "LDH", "CRP" in the last 72 hours.  Lab Results  Component Value Date   [place] NEGATIVE 10/01/2022    CBC: Recent Labs  Lab 01/10/23 0333  WBC 9.2  NEUTROABS 4.4  HGB 11.7*  HCT 36.1*  MCV 88.0  PLT 281    Cardiac Enzymes: No results for input(s): "CKTOTAL", "CKMB", "CKMBINDEX", "TROPONINI" in the last 168 hours. BNP (last 3 results) No results for input(s): "PROBNP" in the last 8760 hours. CBG: Recent Labs  Lab 01/11/23 2313 01/12/23 0750 01/12/23 1232 01/12/23 2341 01/13/23 0811  GLUCAP 104* 126* 137* 132* 126*   D-Dimer: No results for input(s): "DDIMER" in the last 72 hours. Hgb A1c: No results for input(s): "HGBA1C" in the last 72 hours. Lipid Profile: No results for input(s): "CHOL", "HDL", "LDLCALC", "TRIG", "CHOLHDL", "LDLDIRECT" in the last 72 hours. Thyroid function studies: No results for input(s): "TSH", "T4TOTAL", "T3FREE", "THYROIDAB" in the last 72 hours.  Invalid input(s): "FREET3"  Anemia work up: No results for input(s): "VITAMINB12", "FOLATE", "FERRITIN", "TIBC", "IRON", "RETICCTPCT" in the last 72 hours. Sepsis Labs:  Recent Labs  Lab 01/10/23 0333  WBC 9.2    Microbiology No results found for this or any previous visit (from the past 240 hour(s)).    Medications:    Chlorhexidine Gluconate Cloth  6 each Topical Daily   clonazePAM  0.5 mg Per Tube BID   diltiazem  60 mg Per Tube Q6H   enoxaparin (LOVENOX) injection  40 mg Subcutaneous Q24H   famotidine  20 mg Per Tube QHS   feeding supplement (PROSource TF20)  60 mL Per Tube BID   fiber supplement (BANATROL TF)  60 mL Per Tube BID   free water  200 mL Per Tube Q8H   gabapentin  100 mg Per Tube Q8H   guaiFENesin  400 mg Per Tube Q8H    leptospermum manuka honey  1 Application Topical QHS   levETIRAcetam  1,500 mg Per Tube BID   metoprolol tartrate  200 mg Per Tube BID   midodrine  5 mg Per Tube Q8H   mouth rinse  15 mL Mouth Rinse 4 times per day   scopolamine  1 patch Transdermal Q72H   valproic acid  500 mg Per Tube Q8H   Continuous Infusions:  sodium chloride 250 mL (12/13/22 0041)   feeding supplement (OSMOLITE 1.5 CAL) 1,000 mL (01/13/23 1255)      LOS: 102 days   Alma Friendly  [facility]  01/13/2023, 4:00 PM

## 2023-01-14 DIAGNOSIS — J9601 Acute respiratory failure with hypoxia: Secondary | ICD-10-CM | POA: Diagnosis not present

## 2023-01-14 DIAGNOSIS — J101 Influenza due to other identified influenza virus with other respiratory manifestations: Secondary | ICD-10-CM | POA: Diagnosis not present

## 2023-01-14 LAB — GLUCOSE, CAPILLARY
Glucose-Capillary: 105 mg/dL — ABNORMAL HIGH (ref 70–99)
Glucose-Capillary: 112 mg/dL — ABNORMAL HIGH (ref 70–99)
Glucose-Capillary: 116 mg/dL — ABNORMAL HIGH (ref 70–99)
Glucose-Capillary: 126 mg/dL — ABNORMAL HIGH (ref 70–99)
Glucose-Capillary: 132 mg/dL — ABNORMAL HIGH (ref 70–99)
Glucose-Capillary: 133 mg/dL — ABNORMAL HIGH (ref 70–99)

## 2023-01-14 NOTE — Progress Notes (Signed)
[facility] PROGRESS NOTE    Progress Note  Damon [name]  [id number] DOB: 01-02-1961 DOA: 10/03/2022 PCP: [facility]     Brief Narrative:   Damon [name] is an 62 y.o. male past medical history of hypertension, hyperlipidemia tobacco abuse, chronic back pain comes into the [facility] long ED on 10/03/2022 for influenza A protracted hospitalization  Significant Events: 12/18 Flu A positive 12/20 Admitted, bipap, 5 minute code 12/21 Intubated 12/26 MRI brain with hypoxic/anoxic injury 12/28 moved to [facility] for LTM EEG 12/31 family updated by neuro: no chance for meaningful recovery, advised comfort measures 1/8 No significant changes in neuro status 1/16-spoke with wife at length at bedside, Dr. Hulen Skains also had a long conversation with spouse-desires to continue current aggressive care and desires PEG and trach placed 1/18 perc trach and peg 11/05/2022 transferred to floor 11/08/2022 transition to [facility] with pulmonary following weekly for trach 2/4 recurrent fever, cultures collected. 2/5 changed to cuffless trach 2/18 antibiotics started for Pseudomonas on tracheal aspirate cultre 2/22 concern for myoclonic seizure, neuro reconsulted 2/26 transferred to ICU for secretions.  3/1-3/3: Respiratory stable.  Completed 10 days of Zosyn. Okay for transfer back to progressive floor.  Assessment/Plan:   Goals of care IPAL from 11/07/2022, he has been DNR but transitioned back to full code. Remains full code persistent discussion with spouse by different providers have been unsuccessful. Wife has unrealistic expectations  Anoxic brain injury/myoclonic seizure activity: Neurology had a long discussion with the family on 10/14/2022 that she has sustained irreversible anoxic brain injury that is catastrophic, family was encouraged to move towards comfort measures EEG on 11/16/2022 show myoclonic seizures Patient is currently on Keppra, Depakote and  clonazepam Patient has had no neurological improvement in the last 4 weeks.  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  Pulmonary following on a weekly basis tracheostomy was exchanged on 12/13/2022. Has been experiencing significant secretions requiring suctioning  Pseudomonas pneumonia versus tracheitis: Culture on 12/01/2022 Pseudomonas resistant to imipenem. She completed 10-day course of Zosyn on 12/16/2022.  He also received a few days of levofloxacin. Patient is an tobramycin nebulizer treatment.  PEA arrest  Recurrent fever/leukocytosis: Has had intermittent fevers throughout hospital stay, received multiple courses of antibiotics. He has completed courses of antibiotics and antifungal last day on 12/18/2022.  Paroxysmal sympathetic hyperactivity/sinus tachycardia Continue metoprolol 200 mg twice a day plus diltiazem low-dose. Continue IV metoprolol as needed for heart rate greater than 110. Heart rate is hard to control will have low threshold for IV metoprolol.  Continue Neurontin.  TSH unremarkable. Echocardiogram shows a low EF without significant valvular abnormalities.  Constipation Laxatives were held due to frequent bowel movements.  Hypotension Continue midodrine blood pressure stable.  Severe protein caloric malnutrition: Continue tube feedings, PEG tube placed on 11/01/2022.  Prediabetes mellitus: A1c of 5.9 not requiring sliding scale for several days continue CBGs check  Sacral decubitus ulcer stage II RN Pressure Injury Documentation:     DVT prophylaxis: lovenox Family Communication:  none at bedside Status is: Inpatient Remains inpatient appropriate because: Devastating anoxic brain injury.    Code Status:     Code Status Orders  (From admission, onward)           Start      Ordered   11/07/22 1116  Full code  Continuous  Question:  By:  Answer:  Consent: discussion documented in EHR   11/07/22 1115           Code Status History     Date Active Date Inactive Code Status Order ID Comments User Context   10/09/2022 1851 11/07/2022 1115 DNR [id number]  Garner Nash, DO Inpatient   10/03/2022 0808 10/09/2022 1850 Full Code [id number]  Jonnie Finner, DO ED         IV Access:   Peripheral IV   Procedures and diagnostic studies:   No results found.   Medical Consultants:   None.   Subjective:    Damon [name] unchanged    Objective:    Vitals:   01/14/23 1146 01/14/23 1147 01/14/23 1502 01/14/23 1522  BP: 112/86 112/86  107/86  Pulse: 100 100 98 99  Resp: (!) 28 (!) 28 (!) 29 (!) 28  Temp:  98.7 F (37.1 C)  98.4 F (36.9 C)  TempSrc:  Oral  Oral  SpO2: 99% 99% 99% 100%  Weight:      Height:       SpO2: 100 % O2 Flow Rate (L/min): 6 L/min FiO2 (%): 21 %   Intake/Output Summary (Last 24 hours) at 01/14/2023 1644 Last data filed at 01/14/2023 0815 Gross per 24 hour  Intake 1645.5 ml  Output 800 ml  Net 845.5 ml   Filed Weights   01/08/23 0500 01/09/23 0500 01/14/23 0427  Weight: 63.1 kg 63.1 kg 65 kg    Exam: General: NAD  Cardiovascular: S1, S2 present Respiratory: Coarse breath sound b/l, noted abundant tracheal secretion Abdomen: Soft, nontender, nondistended, bowel sounds present Musculoskeletal: No bilateral pedal edema noted Skin: Decubitus ulcer Psychiatry: Unable to assess  Data Reviewed:    Labs: Basic Metabolic Panel: Recent Labs  Lab 01/10/23 0333  NA 139  K 4.2  CL 104  CO2 25  GLUCOSE 92  BUN 37*  CREATININE 0.57*  CALCIUM 9.4    GFR Estimated Creatinine Clearance: 82.2 mL/min (A) (by C-G formula based on SCr of 0.57 mg/dL (L)). Liver Function Tests: No results for input(s): "AST", "ALT", "ALKPHOS", "BILITOT", "PROT", "ALBUMIN" in the last 168 hours.  No results for  input(s): "LIPASE", "AMYLASE" in the last 168 hours. No results for input(s): "AMMONIA" in the last 168 hours. Coagulation profile No results for input(s): "INR", "PROTIME" in the last 168 hours. COVID-19 Labs  No results for input(s): "DDIMER", "FERRITIN", "LDH", "CRP" in the last 72 hours.  Lab Results  Component Value Date   [place] NEGATIVE 10/01/2022    CBC: Recent Labs  Lab 01/10/23 0333  WBC 9.2  NEUTROABS 4.4  HGB 11.7*  HCT 36.1*  MCV 88.0  PLT 281    Cardiac Enzymes: No results for input(s): "CKTOTAL", "CKMB", "CKMBINDEX", "TROPONINI" in the last 168 hours. BNP (last 3 results) No results for input(s): "PROBNP" in the last 8760 hours. CBG: Recent Labs  Lab 01/14/23 0024 01/14/23 0548 01/14/23 0814 01/14/23 1145 01/14/23 1520  GLUCAP 116* 126* 132* 105* 112*   D-Dimer: No results for input(s): "DDIMER" in the last 72 hours. Hgb A1c: No results for input(s): "HGBA1C" in the last 72 hours. Lipid Profile: No results for input(s): "CHOL", "HDL", "LDLCALC", "TRIG", "CHOLHDL", "LDLDIRECT" in the last 72 hours. Thyroid function studies: No results for input(s): "TSH", "T4TOTAL", "T3FREE", "THYROIDAB" in the last 72 hours.  Invalid input(s): "FREET3"  Anemia work up: No results for input(s): "VITAMINB12", "FOLATE", "FERRITIN", "TIBC", "IRON", "RETICCTPCT" in the last 72  hours. Sepsis Labs: Recent Labs  Lab 01/10/23 0333  WBC 9.2    Microbiology No results found for this or any previous visit (from the past 240 hour(s)).    Medications:    Chlorhexidine Gluconate Cloth  6 each Topical Daily   clonazePAM  0.5 mg Per Tube BID   diltiazem  60 mg Per Tube Q6H   enoxaparin (LOVENOX) injection  40 mg Subcutaneous Q24H   famotidine  20 mg Per Tube QHS   feeding supplement (PROSource TF20)  60 mL Per Tube BID   fiber supplement (BANATROL TF)  60 mL Per Tube BID   free water  200 mL Per Tube Q8H   gabapentin  100 mg Per Tube Q8H   guaiFENesin  400  mg Per Tube Q8H   leptospermum manuka honey  1 Application Topical QHS   levETIRAcetam  1,500 mg Per Tube BID   metoprolol tartrate  200 mg Per Tube BID   midodrine  5 mg Per Tube Q8H   mouth rinse  15 mL Mouth Rinse 4 times per day   scopolamine  1 patch Transdermal Q72H   valproic acid  500 mg Per Tube Q8H   Continuous Infusions:  sodium chloride 250 mL (12/13/22 0041)   feeding supplement (OSMOLITE 1.5 CAL) 65 mL/hr at 01/14/23 0700      LOS: 103 days   Alma Friendly  [facility]  01/14/2023, 4:44 PM

## 2023-01-14 NOTE — Progress Notes (Signed)
NAME:  Damon [name], MRN:  [id number], DOB:  Jun 04, 1961, LOS: 103 ADMISSION DATE:  10/03/2022, CONSULTATION DATE:  10/03/2022 REFERRING MD:  Marylyn Ishihara - [facility] CHIEF COMPLAINT:  Dyspnea   History of Present Illness:  62 year old man admitted to [facility] 12/20 in the setting of acute hypoxemic respiratory failure due to Flu A. PMHx significant for HTN, HLD, chronic back pain, tobacco abuse   Placed on BIPAP.  Coded (likely in the setting of hypoxia), initial rhythm PEA with CPR x 5 minutes, required intubation.  After several days on mechanical ventilation remains comatose, MRI Brain 12/26 worrisome for anoxic brain injury.  Transferred to [facility] for LTM EEG monitoring.  Neurology consulted. LTM with generalized epileptogenicity. Treated with Propofol, Depakote, Keppra, Ketamine. 12/31 Neurology with goals of care with family. Relayed that patient has a grim neurological recovery due to irreversible anoxic injury. Family wished to continue aggressive measures.   Palliative care consulted. Stay complicated with neuro-storming.   Surgical consult regarding PEG/trach.  Pertinent Medical History:   Past Medical History:  Diagnosis Date   Elevated lipids    Hypertension    Tobacco abuse    Significant Hospital Events: Including procedures, antibiotic start and stop dates in addition to other pertinent events   12/18 Flu A positive 12/20 Admitted, 5 minute code after non-compliance with BiPAP. MRSA PCR - neg, Urine strep > POS 12/21 Intubated, paralyzed, ARDS protocol, on 7cc/kg 12/22 Weaned to 6 cc/kg with dyssynchrony, back on 7 cc/kg, driving pressures good, weaning vent 12/23 Attempt to wean sedation again with dyssynchrony and desaturation.  Some concern for cuff leak, tube exchange.  Cuff did not appear to be blown.  Ongoing signs of cuff leak, query leaking around cough, possible large trachea, diuresing 12/25 tolerating PSV, myoclonus> ceribell, Neuro consult, changed to propofol, diuresed  12/26  Changed to DNR. On Propofol, versed.  Tolerating PSV.  MRI brain with hypoxic/anoxic injury. Blood culture neg 12/27 GPD's on EEG, pending transfer to [facility] for cEEG. NEURO CONSULT 12/28 moved to [facility] for LTM EEG 12/31 family updated by neuro: no chance for meaningful recovery, they should make plans for comfort measures 1/1 cultures - resp normal flora 1/2 eeg still with epileptogenicity. Palli fam mtg -- family does not want to hear from medical teams unless there is "good news"  1/3  cEEG with burst suppression w highly epileptiform bursts.  1/4 cEEG with burst suppression  + highly epileptiform bursts still.  WBC slightly up to 22.5. Temp high 99 low 100  c-EEG stopped due to lack of benefit Pallative care consult - wife upset about early dc from ER on 12/18 Chaplain consult: Wife is sufering from anticipatory grief + accepting god's way + curious/anger about events leading upto current state EEG: generalized epileptogenicity with high potential for seizures as well as profound diffuse encephalopathy. In the setting of cardiac arrest, this EEG pattern is suggestive of anoxic/hypoxic brain injury.  1/5 Low grade fever +. [place] 22.5K.  on Zosyn. On vent fio2 30%. TF +. Per RN - diprivan stopped yesterday. No myoclonus today. Mild tachycardia + Rpt pall care on 10/21/22 per wife 1/6 On vent FiO2 30%.  Tmax 99,82F ., WBC better. Still off diprivan -> No myoclonus. cXR visualized - devices in place and faiure clear. Oral oxy and klonpin stopped 1/8 No significant changes in neuro status. Tachycardic to 130s, hypertensive to 140s. Coreg transitioned to metoprolol. 1/11 wife considering trach/peg, to discuss with surgery-discussed with Dr. Bobbye Morton 1/12 more tachycardic 1/16-spoke with  wife at length at bedside, Dr. Hulen Skains also had a long conversation with spouse-desires to continue current aggressive care and desires PEG and trach placed 1/18 perc trach and peg 11/05/2022 transferred to floor due to need for  ICU bed 11/08/2022 transition to [facility] with pulmonary following weekly for trach 2/5 ATC 28%, no acute issues with trach  2/26 Copious tracheal sections in the setting of pseudomonas pneumonia resulting in need to move to ICU for close monitoring/ RT care. 2/29 >> Trach change 12/17/2022>> Waiting on Progressive bed 3/18 Cont ATC. Due for trach change    Interim History / Subjective:  NAEON. Continues to have secretions needing suctioniong.  Objective:  Blood pressure (!) 117/90, pulse (!) 113, temperature (!) 97.5 F (36.4 C), temperature source Oral, resp. rate (!) 28, height 5\' 4"  (1.626 m), weight 65 kg, SpO2 92 %.    FiO2 (%):  [21 %] 21 %   Intake/Output Summary (Last 24 hours) at 01/14/2023 0934 Last data filed at 01/14/2023 0700 Gross per 24 hour  Intake 3380 ml  Output 800 ml  Net 2580 ml   BP (!) 117/90 (BP Location: Right Arm)   Pulse (!) 113   Temp (!) 97.5 F (36.4 C) (Oral)   Resp (!) 28   Ht 5\' 4"  (1.626 m)   Wt 65 kg   SpO2 92%   BMI 24.60 kg/m   Filed Weights   01/08/23 0500 01/09/23 0500 01/14/23 0427  Weight: 63.1 kg 63.1 kg 65 kg   Physical Exam: General: chronically ill adult male HEENT: Trach secure copious thick white secretions  Neck: 6 cuffless trach  Neuro: Eyes are open does not follow commands  CV: RRR PULM: Normal effort. Rhonchi bilaterally GI: soft  Extremities: no acute joint deformity  Skin: clean warm    Resolved   Flu A AKI CAP, pneumococcal pneumonia - s/p Zosyn 12/31 - 1/7; Ceftriaxone 12/20- 12/27, Azithro 12/20, Flagyl 12/20 - 12/22 Vent dependence  Assessment & Plan:  Problems: Anoxic brain injury  Seizures Paroxysmal sympathetic hyperactivity Fever Leukocytosis  Anemia Hyperkalemia Tachycardia/hypotension due to paroxysmal sympathetic hyperactivity Severe protein calorie malnutrition Hyperglycemia    Acute respiratory failure with hypoxia Tracheostomy dependence Hx ARDS, flu and aspiration    Hx pseudomonas PNA  Atelectasis   Plan: -Cont routine trach care -- next trach change 4/18 -wouldn't downsize yet given ongoing burden of thick secretions  -pulm hygiene -PRN imaging  -SpO2 goal > 90    PCCM will see weekly. Please call if needs arise sooner.   Montey Hora, [facility] For pager details, please see AMION or use Epic chat  After 1900, please call [facility] for cross coverage needs 01/14/2023, 9:35 AM

## 2023-01-14 NOTE — Progress Notes (Addendum)
12:25pm: CSW received call from [place] at [facility] who states the facility can accept the patient.  CSW spoke with patient's wife [name] to inform her of information. [name] reports she relies on others for transportation outside of [place] so she will have to discuss with her family for scheduling of a tour. All questions answered and CSW provided contact information for [name] to call if needs arise.  10:30am: CSW spoke with admissions at [facility] to request a review of patient's clinicals. CSW sent clinicals via fax for review.  Madilyn Fireman, MSW, LCSW Transitions of Care  Clinical Social Worker II [phone]

## 2023-01-14 NOTE — Progress Notes (Signed)
Order started for trach change today. RT contacted CCM. Per CCM, pt's trach change will be once a month. Pt's last trach change was 12/31/22. Next scheduled trach change will be 01/31/23.

## 2023-01-15 LAB — GLUCOSE, CAPILLARY
Glucose-Capillary: 122 mg/dL — ABNORMAL HIGH (ref 70–99)
Glucose-Capillary: 142 mg/dL — ABNORMAL HIGH (ref 70–99)

## 2023-01-15 NOTE — Progress Notes (Signed)
Physical Therapy Treatment Patient Details Name: Damon [name] MRN: [id number] DOB: [birth year] Today's Date: 01/15/2023   History of Present Illness PT is a 62 yo male admitted to [facility] with flu on Oct 20, 2022. Pt coded 10-21-23 and was intubated. Trach and PEG placed 11/01/22. MRI of brain revealed "Fairly symmetric diffusion-weighted and T2 FLAIR hyperintense  signal abnormality within the bilateral basal ganglia. Subtle signal  changes are also suspected within the bilateral hippocampi. Given  the provided history, these findings are compatible with acute  hypoxic/ischemic injury". PMH: HLD, HTN, back pain.    PT Comments    Patient not progressing and unable to initiate head control or extremity movement despite cues and increased time.  Does note some attention for brief moments visually but not sustained nor able to respond to command.  Did close his mouth around suction device today briefly with automatic response.  Patient not progressing in therapy over 1 month trial.  PT will sign off.  Please consult again if pt able to participate.    Recommendations for follow up therapy are one component of a multi-disciplinary discharge planning process, led by the attending physician.  Recommendations may be updated based on patient status, additional functional criteria and insurance authorization.  Follow Up Recommendations  Can patient physically be transported by private vehicle: No    Assistance Recommended at Discharge Frequent or constant Supervision/Assistance  Patient can return home with the following Two people to help with walking and/or transfers;Two people to help with bathing/dressing/bathroom;Assistance with cooking/housework;Direct supervision/assist for medications management;Direct supervision/assist for financial management;Assistance with feeding;Assist for transportation;Help with stairs or ramp for entrance   Equipment Recommendations  Wheelchair (measurements PT);Wheelchair cushion  (measurements PT);Hospital bed;Other (comment)    Recommendations for Other Services       Precautions / Restrictions Precautions Precautions: Fall Precaution Comments: PEG (abdominal binder); Trach Restrictions Weight Bearing Restrictions: No     Mobility  Bed Mobility Overal bed mobility: Needs Assistance Bed Mobility: Rolling, Supine to Sit, Sit to Supine Rolling: +2 for physical assistance, Total assist   Supine to sit: +2 for physical assistance, Total assist Sit to supine: +2 for physical assistance, Total assist   General bed mobility comments: up to EOB with A for legs and trunk and for head control in sitting due to coughing when head not supported with chin resting on track collar.  assist for legs and trunk for sit to supine and rolling for hygiene and linen change then for positioning once supine    Transfers                        Ambulation/Gait                   Stairs             Wheelchair Mobility    Modified Rankin (Stroke Patients Only)       Balance Overall balance assessment: Needs assistance Sitting-balance support: Feet supported, No upper extremity supported Sitting balance-[name]: Zero Sitting balance - Comments: no attempts to assist with head or trunk control despite cues and increased time                                    Cognition Arousal/Alertness: Awake/alert Behavior During Therapy: Flat affect Overall Cognitive Status: Impaired/Different from baseline  General Comments: tracking briefly tech at end of bed and attention to therapist initially upon entry, but not throughout; does attempt today to close mouth around suction device, no command following nor attempt to follow command for tounge protrusion or lifting head or participating in mobility/ROM        Exercises Other Exercises Other Exercises: PROM UE/LE in supine; noted  tightness/limited ROM bil hands and shoulders (fingers in flexion and hands placed with fingers extended on pillows at end of session); rt knee flexion limited to 30 degrees with full extension; noted RLE extensor tone when left knee flexed otherwise no response to ROM    General Comments General comments (skin integrity, edema, etc.): VSS on trach collar      Pertinent Vitals/Pain Pain Assessment Pain Assessment: Faces Faces Pain Scale: No hurt    Home Living                          Prior Function            PT Goals (current goals can now be found in the care plan section) Progress towards PT goals: Not progressing toward goals - comment    Frequency    Min 1X/week      PT Plan Current plan remains appropriate    Co-evaluation              AM-PAC PT "6 Clicks" Mobility   Outcome Measure  Help needed turning from your back to your side while in a flat bed without using bedrails?: Total Help needed moving from lying on your back to sitting on the side of a flat bed without using bedrails?: Total Help needed moving to and from a bed to a chair (including a wheelchair)?: Total Help needed standing up from a chair using your arms (e.g., wheelchair or bedside chair)?: Total Help needed to walk in hospital room?: Total Help needed climbing 3-5 steps with a railing? : Total 6 Click Score: 6    End of Session Equipment Utilized During Treatment: Oxygen Activity Tolerance: No increased pain;Patient tolerated treatment well Patient left: in bed;with SCD's reapplied;with call bell/phone within reach;with bed alarm set   PT Visit Diagnosis: Other symptoms and signs involving the nervous system [id number])     Time: [id number] PT Time Calculation (min) (ACUTE ONLY): 23 min  Charges:  $Therapeutic Activity: 23-37 mins                     Magda Kiel, PT Acute Rehabilitation Services [phone] 01/15/2023    Reginia Naas 01/15/2023, 5:11 PM

## 2023-01-15 NOTE — Progress Notes (Signed)
CSW received call from patient's wife [name] who had questions about [facility]. [name] states she is still searching for a ride to the facility to complete the tour. [name] states she has several appointments for herself today so she will not be able to go to the facility for a tour. [name] repeatedly asks CSW "what if I don't accept the bed offer?" or "what if I don't like the facility?" CSW informed her the significance of this bed offer as it is a local facility with adequate staffing to handle the patient's care needs. [name] adamant that she cannot care for the patient at home but will be very picky about where he goes.  CSW informed MD of information.  Madilyn Fireman, MSW, LCSW Transitions of Care  Clinical Social Worker II [phone]

## 2023-01-15 NOTE — Progress Notes (Signed)
[facility] PROGRESS NOTE    Progress Note  Parson Carson  [id number] DOB: 11-01-1960 DOA: 10/03/2022 PCP: [facility]     Brief Narrative:   Broderic Cullimore is an 62 y.o. male past medical history of hypertension, hyperlipidemia tobacco abuse, chronic back pain comes into the [facility] ED on 10/03/2022 for influenza A protracted hospitalization  Significant Events: 12/18 Flu A positive 12/20 Admitted, bipap, 5 minute code 12/21 Intubated 12/26 MRI brain with hypoxic/anoxic injury 12/28 moved to [facility] for LTM EEG 12/31 family updated by neuro: no chance for meaningful recovery, advised comfort measures 1/16-Discussions with wife at length including Dr. Hulen Skains also had a long conversation with spouse-desires to continue current aggressive care and desires PEG and trach placed 1/18 perc trach and peg 11/05/2022 transferred to floor 11/08/2022 transition to [facility] with pulmonary following weekly for trach 2/4 recurrent fever, cultures collected. 2/5 changed to cuffless trach 2/18 antibiotics started for Pseudomonas on tracheal aspirate cultre 2/22 concern for myoclonic seizure, neuro reconsulted 2/26 transferred to ICU for secretions.  3/1  Respiratory stable.  Completed 10 days of Zosyn. Okay for transfer back to progressive floor.  Assessment/Plan:   Goals of care IPAL from 11/07/2022, he has been DNR but transitioned back to full code. Remains full code persistent discussion with spouse by different providers have been unsuccessful. Wife has unrealistic expectations  Anoxic brain injury/myoclonic seizure activity: Neurology had a long discussion with the family on 10/14/2022 that she has sustained irreversible anoxic brain injury that is catastrophic, family was encouraged to move towards comfort measures EEG on 11/16/2022 show myoclonic seizures Patient is currently on Keppra, Depakote and clonazepam Patient has had no neurological  improvement in the last 4 weeks.  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  Pulmonary following on a weekly basis tracheostomy was exchanged on 12/13/2022. Has been experiencing significant secretions requiring suctioning  Pseudomonas pneumonia versus tracheitis: Culture on 12/01/2022 Pseudomonas resistant to imipenem. She completed 10-day course of Zosyn on 12/16/2022.  He also received a few days of levofloxacin. Patient is an tobramycin nebulizer treatment.  PEA arrest  Recurrent fever/leukocytosis: Has had intermittent fevers throughout hospital stay, received multiple courses of antibiotics. He has completed courses of antibiotics and antifungal last day on 12/18/2022.  Paroxysmal sympathetic hyperactivity/sinus tachycardia Continue metoprolol 200 mg twice a day plus diltiazem low-dose. Continue IV metoprolol as needed for heart rate greater than 110. Heart rate is hard to control will have low threshold for IV metoprolol.  Continue Neurontin.  TSH unremarkable. Echocardiogram shows a low EF without significant valvular abnormalities.  Constipation Laxatives were held due to frequent bowel movements.  Hypotension Continue midodrine blood pressure stable.  Severe protein caloric malnutrition: Continue tube feedings, PEG tube placed on 11/01/2022.  Prediabetes mellitus: A1c of 5.9 not requiring sliding scale for several days continue CBGs check  Sacral decubitus ulcer stage II RN Pressure Injury Documentation:     DVT prophylaxis: lovenox Family Communication:  none at bedside Status is: Inpatient Remains inpatient appropriate because: Devastating anoxic brain injury.    Code Status:     Code Status Orders  (From admission, onward)           Start     Ordered   11/07/22 1116  Full code  Continuous        Question:  By:  Answer:  Consent:  discussion documented in EHR   11/07/22 1115           Code Status History     Date Active Date Inactive Code Status Order ID Comments User Context   10/09/2022 1851 11/07/2022 1115 DNR [id number]  Garner Nash, DO Inpatient   10/03/2022 0808 10/09/2022 1850 Full Code [id number]  Jonnie Finner, DO ED         IV Access:   Peripheral IV   Procedures and diagnostic studies:   No results found.   Medical Consultants:   None.   Subjective:    Taeshawn Fish status remains unchanged.  TOC following closely for discharge   Objective:    Vitals:   01/15/23 1142 01/15/23 1215 01/15/23 1445 01/15/23 1554  BP: (!) 114/92  111/86   Pulse:  (!) 102 98 (!) 102  Resp:  (!) 26 (!) 24 (!) 26  Temp: (!) 97.4 F (36.3 C)     TempSrc: Oral     SpO2:  95% 96% 97%  Weight:      Height:       SpO2: 97 % O2 Flow Rate (L/min): 6 L/min FiO2 (%): 21 %   Intake/Output Summary (Last 24 hours) at 01/15/2023 1625 Last data filed at 01/15/2023 0700 Gross per 24 hour  Intake 1710 ml  Output 550 ml  Net 1160 ml   Filed Weights   01/09/23 0500 01/14/23 0427 01/15/23 0500  Weight: 63.1 kg 65 kg 63 kg    Exam: General: NAD  Cardiovascular: S1, S2 present Respiratory: Coarse breath sound b/l, noted abundant tracheal secretion Abdomen: Soft, nontender, nondistended, bowel sounds present Musculoskeletal: No bilateral pedal edema noted Skin: Decubitus ulcer Psychiatry: Unable to assess  Data Reviewed:    Labs: Basic Metabolic Panel: Recent Labs  Lab 01/10/23 0333  NA 139  K 4.2  CL 104  CO2 25  GLUCOSE 92  BUN 37*  CREATININE 0.57*  CALCIUM 9.4    GFR Estimated Creatinine Clearance: 82.2 mL/min (A) (by C-G formula based on SCr of 0.57 mg/dL (L)). Liver Function Tests: No results for input(s): "AST", "ALT", "ALKPHOS", "BILITOT", "PROT", "ALBUMIN" in the last 168 hours.  No results for input(s): "LIPASE", "AMYLASE" in  the last 168 hours. No results for input(s): "AMMONIA" in the last 168 hours. Coagulation profile No results for input(s): "INR", "PROTIME" in the last 168 hours. COVID-19 Labs  No results for input(s): "DDIMER", "FERRITIN", "LDH", "CRP" in the last 72 hours.  Lab Results  Component Value Date   [place] NEGATIVE 10/01/2022    CBC: Recent Labs  Lab 01/10/23 0333  WBC 9.2  NEUTROABS 4.4  HGB 11.7*  HCT 36.1*  MCV 88.0  PLT 281    Cardiac Enzymes: No results for input(s): "CKTOTAL", "CKMB", "CKMBINDEX", "TROPONINI" in the last 168 hours. BNP (last 3 results) No results for input(s): "PROBNP" in the last 8760 hours. CBG: Recent Labs  Lab 01/14/23 0814 01/14/23 1145 01/14/23 1520 01/14/23 2332 01/15/23 0814  GLUCAP 132* 105* 112* 133* 122*   D-Dimer: No results for input(s): "DDIMER" in the last 72 hours. Hgb A1c: No results for input(s): "HGBA1C" in the last 72 hours. Lipid Profile: No results for input(s): "CHOL", "HDL", "LDLCALC", "TRIG", "CHOLHDL", "LDLDIRECT" in the last 72 hours. Thyroid function studies: No results for input(s): "TSH", "T4TOTAL", "T3FREE", "THYROIDAB" in the last 72 hours.  Invalid input(s): "FREET3"  Anemia work up: No results for input(s): "VITAMINB12", "FOLATE", "FERRITIN", "TIBC", "IRON", "RETICCTPCT" in the last 72  hours. Sepsis Labs: Recent Labs  Lab 01/10/23 0333  WBC 9.2    Microbiology No results found for this or any previous visit (from the past 240 hour(s)).    Medications:    Chlorhexidine Gluconate Cloth  6 each Topical Daily   clonazePAM  0.5 mg Per Tube BID   diltiazem  60 mg Per Tube Q6H   enoxaparin (LOVENOX) injection  40 mg Subcutaneous Q24H   famotidine  20 mg Per Tube QHS   feeding supplement (PROSource TF20)  60 mL Per Tube BID   fiber supplement (BANATROL TF)  60 mL Per Tube BID   free water  200 mL Per Tube Q8H   gabapentin  100 mg Per Tube Q8H   guaiFENesin  400 mg Per Tube Q8H   leptospermum  manuka honey  1 Application Topical QHS   levETIRAcetam  1,500 mg Per Tube BID   metoprolol tartrate  200 mg Per Tube BID   midodrine  5 mg Per Tube Q8H   mouth rinse  15 mL Mouth Rinse 4 times per day   scopolamine  1 patch Transdermal Q72H   valproic acid  500 mg Per Tube Q8H   Continuous Infusions:  sodium chloride 250 mL (12/13/22 0041)   feeding supplement (OSMOLITE 1.5 CAL) 65 mL/hr at 01/15/23 0700      LOS: 104 days   Alma Friendly  [facility]  01/15/2023, 4:25 PM

## 2023-01-15 NOTE — Progress Notes (Signed)
Nutrition Follow-up  DOCUMENTATION CODES:   Non-severe (moderate) malnutrition in context of acute illness/injury  INTERVENTION:   Continue tube feeds via PEG: - Osmolite 1.5 @ 65 ml/hr (1560 ml/day) - PROSource TF20 60 ml BID - Free water flushes of 200 ml q 8 hours  Tube feeding regimen and current free water flushes provide 2500 kcal, 138 grams of protein, and 1789 ml of H2O.   - Continue Banatrol TF BID per tube, each packet provides 5 grams of soluble fiber   NUTRITION DIAGNOSIS:   Moderate Malnutrition related to acute illness as evidenced by mild fat depletion, moderate fat depletion, severe fat depletion, severe muscle depletion, percent weight loss.  Ongoing, being addressed via TF  GOAL:   Patient will meet greater than or equal to 90% of their needs  Met via TF  MONITOR:   Labs, Weight trends, TF tolerance, Skin, I & O's  REASON FOR ASSESSMENT:   Consult Enteral/tube feeding initiation and management  ASSESSMENT:   Pt admitted from home with the flu leading to acute respiratory failure with hypoxia and PEA arrest upon admission. PMH significant for HTN, HLD, chronic back pain, tobacco use.  Pt remains NPO with tube feeds infusing via PEG. Tube feeds infusing at goal rate at time of RD visit. No family present in room. Weight stable compared to weight at last RD follow-up. Pt remains appropriate for discharge.  Admit weight: 64.9 kg Current weight: 63 kg  Current TF: Osmolite 1.5 @ 65 ml/hr, PROSource TF20 60 ml BID, free water flushes 200 ml q 8 hours  Medications reviewed and include: pepcid, banatrol TF BID, scopolamine patch  Labs reviewed. CBG's: 105-133 x 24 hours  UOP: 550 ml x 24 hours  Diet Order:   Diet Order     None       EDUCATION NEEDS:   No education needs have been identified at this time  Skin:  Skin Assessment: Skin Integrity Issues: Other: crack to perineal area  Last BM:  01/14/23 medium type 6  Height:   Ht  Readings from Last 1 Encounters:  10/11/22 5\' 4"  (1.626 m)    Weight:   Wt Readings from Last 1 Encounters:  01/15/23 63 kg    BMI:  Body mass index is 23.84 kg/m.  Estimated Nutritional Needs:   Kcal:  2500-2700 kcals  Protein:  125-150 grams  Fluid:  >/= 2 L/day    Gustavus Bryant, MS, RD, LDN Inpatient Clinical Dietitian Please see AMiON for contact information.

## 2023-01-16 LAB — BASIC METABOLIC PANEL
Anion gap: 11 (ref 5–15)
BUN: 39 mg/dL — ABNORMAL HIGH (ref 6–20)
CO2: 24 mmol/L (ref 22–32)
Calcium: 9.5 mg/dL (ref 8.9–10.3)
Chloride: 101 mmol/L (ref 98–111)
Creatinine, Ser: 0.73 mg/dL (ref 0.61–1.24)
GFR, Estimated: >60 mL/min (ref >60)
Glucose, Bld: 95 mg/dL (ref 70–99)
Potassium: 4.5 mmol/L (ref 3.5–5.1)
Sodium: 136 mmol/L (ref 135–145)

## 2023-01-16 LAB — CBC
HCT: 41.2 % (ref 39.0–52.0)
Hemoglobin: 12.7 g/dL — ABNORMAL LOW (ref 13.0–17.0)
MCH: 27.9 pg (ref 26.0–34.0)
MCHC: 30.8 g/dL (ref 30.0–36.0)
MCV: 90.4 fL (ref 80.0–100.0)
Platelets: 310 10*3/uL (ref 150–400)
RBC: 4.56 MIL/uL (ref 4.22–5.81)
RDW: 14.6 % (ref 11.5–15.5)
WBC: 9.2 10*3/uL (ref 4.0–10.5)
nRBC: 0 % (ref 0.0–0.2)

## 2023-01-16 LAB — GLUCOSE, CAPILLARY
Glucose-Capillary: 121 mg/dL — ABNORMAL HIGH (ref 70–99)
Glucose-Capillary: 123 mg/dL — ABNORMAL HIGH (ref 70–99)
Glucose-Capillary: 130 mg/dL — ABNORMAL HIGH (ref 70–99)

## 2023-01-16 NOTE — Progress Notes (Signed)
PROGRESS NOTE  Damon [name]  DOB: [birth year]  PCP: [facility] [id number]  DOA: 10/03/2022  LOS: 105 days  Hospital Day: 106  Brief narrative: Damon [name] is a 62 y.o. male with PMH significant for hypertension, hyperlipidemia tobacco abuse, chronic back pain 12/20, 2023, patient presented to ED for shortness of breath, diagnosed to have influenza A He subsequently had a CODE BLUE called on 12/20, had a protracted hospitalization, timeline of events as below.   Significant Events: 12/18 Flu A positive 12/20 Admitted, bipap, 5 minute code 12/21 Intubated 12/26 MRI brain with hypoxic/anoxic injury 12/28 moved to [facility] for LTM EEG 12/31 family updated by neuro: no chance for meaningful recovery, advised comfort measures 1/8 No significant changes in neuro status 1/16-spoke with wife at length at bedside, Dr. Hulen Skains also had a long conversation with spouse-desires to continue current aggressive care and desires PEG and trach placed 1/18 perc trach and peg 11/05/2022 transferred to floor 11/08/2022 transition to [facility] with pulmonary following weekly for trach 2/4 recurrent fever, cultures collected. 2/5 changed to cuffless trach 2/18 antibiotics started for Pseudomonas on tracheal aspirate cultre 2/22 concern for myoclonic seizure, neuro reconsulted 2/26 transferred to ICU for secretions.  3/1-3/3: Respiratory stable.  Completed 10 days of Zosyn. Okay for transfer back to progressive floor.  Subjective: Patient was seen and examined this afternoon.  Not in distress.  No change in status  Assessment and plan: Goals of care IPAL from 11/07/2022, he has been DNR but transitioned back to full code. Remains full code persistent discussion with spouse by different providers have been unsuccessful. Wife has unrealistic expectations   Anoxic brain injury/myoclonic seizure activity: Neurology had a long discussion with the family on 10/14/2022 that she  has sustained irreversible anoxic brain injury that is catastrophic, family was encouraged to move towards comfort measures EEG on 11/16/2022 show myoclonic seizures Patient is currently on Keppra, Depakote and clonazepam Patient has had no neurological improvement in the last 4 weeks.   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  Pulmonary following on a weekly basis tracheostomy was exchanged on 12/13/2022. Has been experiencing significant secretions requiring suctioning   Pseudomonas pneumonia versus tracheitis: Culture on 12/01/2022 Pseudomonas resistant to imipenem. She completed 10-day course of Zosyn on 12/16/2022.  He also received a few days of levofloxacin. Patient is an tobramycin nebulizer treatment.    Recurrent fever/leukocytosis: Has had intermittent fevers throughout hospital stay, received multiple courses of antibiotics. He has completed courses of antibiotics and antifungal last day on 12/18/2022.   Paroxysmal sympathetic hyperactivity/sinus tachycardia Continue metoprolol 200 mg twice a day plus diltiazem low-dose. Continue IV metoprolol as needed for heart rate greater than 110. Heart rate is hard to control will have low threshold for IV metoprolol.  Continue Neurontin.  TSH unremarkable. Echocardiogram shows a low EF without significant valvular abnormalities.   Constipation Laxatives were held due to frequent bowel movements.   Hypotension Continue midodrine blood pressure stable.   Severe protein caloric malnutrition: Continue tube feedings, PEG tube placed on 11/01/2022.   Prediabetes mellitus: A1c of 5.9 not requiring sliding scale for several days continue CBGs check   Sacral decubitus ulcer stage II    Mobility: Bedbound  Goals of care   Code Status: Full Code     DVT prophylaxis:   enoxaparin (LOVENOX) injection 40 mg Start: 11/01/22 2000  Antimicrobials: Tobramycin nebulization Fluid: None Consultants:  Family Communication: None at bedside  Status: Inpatient Level of care:  Progressive   Needs to continue in-hospital care:  Difficult to place  Prognosis:  Poor  Patient from: Home     Diet:  Diet Order     None       Scheduled Meds:  Chlorhexidine Gluconate Cloth  6 each Topical Daily   clonazePAM  0.5 mg Per Tube BID   diltiazem  60 mg Per Tube Q6H   enoxaparin (LOVENOX) injection  40 mg Subcutaneous Q24H   famotidine  20 mg Per Tube QHS   feeding supplement (PROSource TF20)  60 mL Per Tube BID   fiber supplement (BANATROL TF)  60 mL Per Tube BID   free water  200 mL Per Tube Q8H   gabapentin  100 mg Per Tube Q8H   guaiFENesin  400 mg Per Tube Q8H   leptospermum manuka honey  1 Application Topical QHS   levETIRAcetam  1,500 mg Per Tube BID   metoprolol tartrate  200 mg Per Tube BID   midodrine  5 mg Per Tube Q8H   mouth rinse  15 mL Mouth Rinse 4 times per day   scopolamine  1 patch Transdermal Q72H   valproic acid  500 mg Per Tube Q8H    PRN meds: acetaminophen (TYLENOL) oral liquid 160 mg/5 mL **OR** acetaminophen, ipratropium-albuterol, lip balm, metoprolol tartrate, ondansetron **OR** ondansetron (ZOFRAN) IV, mouth rinse, oxyCODONE   Infusions:   sodium chloride 250 mL (12/13/22 0041)   feeding supplement (OSMOLITE 1.5 CAL) 1,000 mL (01/16/23 1352)    Antimicrobials: Anti-infectives (From admission, onward)    Start     Dose/Rate Route Frequency Ordered Stop   12/13/22 1200  piperacillin-tazobactam (ZOSYN) IVPB 3.375 g        3.375 g 12.5 mL/hr over 240 Minutes Intravenous Every 8 hours 12/13/22 1003 12/16/22 2330   12/13/22 1100  tobramycin (PF) (TOBI) nebulizer solution 300 mg  Status:  Discontinued        300 mg Nebulization 2 times daily 12/13/22 1010 12/27/22 1406   12/12/22 2130  fluconazole (DIFLUCAN) 40 MG/ML  suspension 100 mg  Status:  Discontinued        100 mg Oral Daily 12/12/22 2040 12/12/22 2043   12/12/22 2130  fluconazole (DIFLUCAN) tablet 100 mg        100 mg Per Tube Daily 12/12/22 2043 12/18/22 1029   12/11/22 0930  levofloxacin (LEVAQUIN) IVPB 750 mg        750 mg 100 mL/hr over 90 Minutes Intravenous Every 24 hours 12/11/22 0830 12/13/22 1246   12/03/22 1115  piperacillin-tazobactam (ZOSYN) IVPB 3.375 g        3.375 g 12.5 mL/hr over 240 Minutes Intravenous Every 8 hours 12/03/22 1018 12/13/22 0746   12/02/22 1245  ceFEPIme (MAXIPIME) 2 g in sodium chloride 0.9 % 100 mL IVPB  Status:  Discontinued        2 g 200 mL/hr over 30 Minutes Intravenous Every 8 hours 12/02/22 1145 12/03/22 0940   11/05/22 0900  ceFEPIme (MAXIPIME) 2 g in sodium chloride 0.9 % 100 mL IVPB        2 g 200 mL/hr over 30 Minutes Intravenous Every 8 hours 11/05/22 0804 11/12/22 0050   11/01/22 1215  ceFAZolin (ANCEF) IVPB 2g/100 mL premix        2 g 200 mL/hr over 30 Minutes Intravenous  Once 11/01/22 0851 11/01/22 1232  10/14/22 1000  piperacillin-tazobactam (ZOSYN) IVPB 3.375 g        3.375 g 12.5 mL/hr over 240 Minutes Intravenous Every 8 hours 10/14/22 0951 10/21/22 0556   10/04/22 2200  cefTRIAXone (ROCEPHIN) 2 g in sodium chloride 0.9 % 100 mL IVPB  Status:  Discontinued        2 g 200 mL/hr over 30 Minutes Intravenous Daily at bedtime 10/04/22 0914 10/10/22 0900   10/04/22 1045  oseltamivir (TAMIFLU) 6 MG/ML suspension 75 mg        75 mg Per Tube 2 times daily 10/04/22 0950 10/08/22 2229   10/04/22 0600  cefTRIAXone (ROCEPHIN) 2 g in sodium chloride 0.9 % 100 mL IVPB  Status:  Discontinued        2 g 200 mL/hr over 30 Minutes Intravenous Every 24 hours 10/03/22 1054 10/03/22 1250   10/04/22 0600  azithromycin (ZITHROMAX) 500 mg in sodium chloride 0.9 % 250 mL IVPB  Status:  Discontinued        500 mg 250 mL/hr over 60 Minutes Intravenous Every 24 hours 10/03/22 1054 10/03/22 1250   10/03/22 2200   cefTRIAXone (ROCEPHIN) 1 g in sodium chloride 0.9 % 100 mL IVPB  Status:  Discontinued        1 g 200 mL/hr over 30 Minutes Intravenous Daily at bedtime 10/03/22 1559 10/04/22 0914   10/03/22 2200  metroNIDAZOLE (FLAGYL) IVPB 500 mg  Status:  Discontinued        500 mg 100 mL/hr over 60 Minutes Intravenous Every 12 hours 10/03/22 1559 10/05/22 0812   10/03/22 1400  piperacillin-tazobactam (ZOSYN) IVPB 3.375 g  Status:  Discontinued        3.375 g 12.5 mL/hr over 240 Minutes Intravenous Every 8 hours 10/03/22 1258 10/03/22 1559   10/03/22 0515  cefTRIAXone (ROCEPHIN) 1 g in sodium chloride 0.9 % 100 mL IVPB        1 g 200 mL/hr over 30 Minutes Intravenous  Once 10/03/22 0503 10/03/22 0732   10/03/22 0515  azithromycin (ZITHROMAX) 500 mg in sodium chloride 0.9 % 250 mL IVPB        500 mg 250 mL/hr over 60 Minutes Intravenous  Once 10/03/22 0503 10/03/22 0732       Nutritional status:  Body mass index is 23.88 kg/m.  Nutrition Problem: Moderate Malnutrition Etiology: acute illness Signs/Symptoms: mild fat depletion, moderate fat depletion, severe fat depletion, severe muscle depletion, percent weight loss     Objective: Vitals:   01/16/23 1532 01/16/23 1538  BP:  114/82  Pulse: (!) 104 (!) 102  Resp: (!) 29 (!) 21  Temp:  99 F (37.2 C)  SpO2: 98% 99%    Intake/Output Summary (Last 24 hours) at 01/16/2023 1616 Last data filed at 01/16/2023 0311 Gross per 24 hour  Intake 780 ml  Output 850 ml  Net -70 ml   Filed Weights   01/14/23 0427 01/15/23 0500 01/16/23 0500  Weight: 65 kg 63 kg 63.1 kg   Weight change: 0.1 kg Body mass index is 23.88 kg/m.   Physical Exam: General exam: Not in distress. Skin: No rashes, lesions or ulcers. HEENT: Atraumatic, normocephalic, no obvious bleeding.  Has trach anteriorly Lungs: Coarse breath sound bilaterally, evident tracheal secretions CVS: No bilateral pedal edema GI/Abd soft, nontender, nondistended, PEG tube site  intact Psychiatry: Unable to assess because of altered mentation  Data Review: I have personally reviewed the laboratory data and studies available.  F/u labs  Unresulted Labs (From admission,  onward)    None       Total time spent in review of labs and imaging, patient evaluation, formulation of plan, documentation and communication with family: 1 minutes  Signed, Terrilee Croak, MD [facility] 01/16/2023

## 2023-01-17 LAB — GLUCOSE, CAPILLARY
Glucose-Capillary: 113 mg/dL — ABNORMAL HIGH (ref 70–99)
Glucose-Capillary: 124 mg/dL — ABNORMAL HIGH (ref 70–99)
Glucose-Capillary: 134 mg/dL — ABNORMAL HIGH (ref 70–99)

## 2023-01-17 NOTE — Progress Notes (Addendum)
11:20am: CSW received e-mail from [place] of financial counseling that states the original [place] application that was submitted earlier in this admission was never received by DSS - the application was resubmitted.  8:30am: CSW spoke with Levada Dy in admissions at [facility] who states she has spoken with patient's wife [name] who has a tour scheduled at the facility on tomorrow 01/18/23 at [facility] will return call to CSW once tour is completed.  Madilyn Fireman, MSW, LCSW Transitions of Care  Clinical Social Worker II [phone]

## 2023-01-17 NOTE — Progress Notes (Signed)
PROGRESS NOTE  Damon [name]  DOB: May 23, 1961  PCP: [facility] [id number]  DOA: 10/03/2022  LOS: 106 days  Hospital Day: 107  Brief narrative: Damon [name] is a 62 y.o. male with PMH significant for hypertension, hyperlipidemia tobacco abuse, chronic back pain 12/20, 2023, patient presented to ED for shortness of breath, diagnosed to have influenza A He subsequently had a CODE BLUE called on 12/20, had a protracted hospitalization, timeline of events as below.   Significant Events: 12/18 Flu A positive 12/20 Admitted, bipap, 5 minute code 12/21 Intubated 12/26 MRI brain with hypoxic/anoxic injury 12/28 moved to [facility] for LTM EEG 12/31 family updated by neuro: no chance for meaningful recovery, advised comfort measures 1/8 No significant changes in neuro status 1/16-spoke with wife at length at bedside, Dr. Hulen Skains also had a long conversation with spouse-desires to continue current aggressive care and desires PEG and trach placed 1/18 perc trach and peg 11/05/2022 transferred to floor 11/08/2022 [facility] to [facility] with pulmonary following weekly for trach 2/4 recurrent fever, cultures collected. 2/5 changed to cuffless trach 2/18 antibiotics started for Pseudomonas on tracheal aspirate cultre 2/22 concern for myoclonic seizure, neuro reconsulted 2/26 transferred to ICU for secretions.  3/1-3/3: Respiratory stable.  Completed 10 days of Zosyn. Okay for transfer back to progressive floor.  Subjective: Patient was seen and examined this morning.  Tries to open eyes on loud command.  Unable to show any movements.  No family at bedside.  Assessment and plan: Goals of care IPAL from 11/07/2022, he has been DNR but transitioned back to full code. Remains full code persistent discussion with spouse by different providers have been unsuccessful. 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  Pulmonary following on a weekly basis tracheostomy was exchanged on 12/13/2022. Has been experiencing significant secretions requiring suctioning   Pseudomonas pneumonia versus tracheitis: Culture on 12/01/2022 Pseudomonas resistant to imipenem. She completed 10-day course of Zosyn on 12/16/2022.  He also received a few days of levofloxacin. Patient is an tobramycin nebulizer treatment.    Recurrent fever/leukocytosis: Has had intermittent fevers throughout hospital stay, received multiple courses of antibiotics. He has completed courses of antibiotics and antifungal last day on 12/18/2022.   Paroxysmal sympathetic hyperactivity/sinus tachycardia Continue metoprolol 200 mg twice a day plus diltiazem low-dose. Continue IV metoprolol as needed for heart rate greater than 110. Heart rate is hard to control will have low threshold for IV metoprolol.  Continue Neurontin.  TSH unremarkable. Echocardiogram shows a low EF without significant valvular abnormalities.   Constipation Laxatives were held due to frequent bowel movements.   Hypotension Continue midodrine blood pressure stable.   Severe protein caloric malnutrition: Continue tube feedings, PEG tube placed on 11/01/2022.   Prediabetes mellitus: A1c of 5.9 not requiring sliding scale for several days continue CBGs check   Sacral decubitus ulcer stage II    Mobility: Bedbound  Goals of care   Code  Status: Full Code     DVT  prophylaxis:  enoxaparin (LOVENOX) injection 40 mg Start: 11/01/22 2000   Antimicrobials: Tobramycin nebulization Fluid: None Consultants:  Family Communication: None at bedside  Status: Inpatient Level of care:  Progressive   Needs to continue in-hospital care:  Difficult to place  Prognosis:  Poor  Patient from: Home     Diet:  Diet Order     None       Scheduled Meds:  Chlorhexidine Gluconate Cloth  6 each Topical Daily   clonazePAM  0.5 mg Per Tube BID   diltiazem  60 mg Per Tube Q6H   enoxaparin (LOVENOX) injection  40 mg Subcutaneous Q24H   famotidine  20 mg Per Tube QHS   feeding supplement (PROSource TF20)  60 mL Per Tube BID   fiber supplement (BANATROL TF)  60 mL Per Tube BID   free water  200 mL Per Tube Q8H   gabapentin  100 mg Per Tube Q8H   guaiFENesin  400 mg Per Tube Q8H   leptospermum manuka honey  1 Application Topical QHS   levETIRAcetam  1,500 mg Per Tube BID   metoprolol tartrate  200 mg Per Tube BID   midodrine  5 mg Per Tube Q8H   mouth rinse  15 mL Mouth Rinse 4 times per day   scopolamine  1 patch Transdermal Q72H   valproic acid  500 mg Per Tube Q8H    PRN meds: acetaminophen (TYLENOL) oral liquid 160 mg/5 mL **OR** acetaminophen, ipratropium-albuterol, lip balm, metoprolol tartrate, ondansetron **OR** ondansetron (ZOFRAN) IV, mouth rinse, oxyCODONE   Infusions:   sodium chloride 250 mL (12/13/22 0041)   feeding supplement (OSMOLITE 1.5 CAL) 1,000 mL (01/17/23 0510)    Antimicrobials: Anti-infectives (From admission, onward)    Start     Dose/Rate Route Frequency Ordered Stop   12/13/22 1200  piperacillin-tazobactam (ZOSYN) IVPB 3.375 g        3.375 g 12.5 mL/hr over 240 Minutes Intravenous Every 8 hours 12/13/22 1003 12/16/22 2330   12/13/22 1100  tobramycin (PF) (TOBI) nebulizer solution 300 mg  Status:  Discontinued        300 mg Nebulization 2 times daily 12/13/22 1010 12/27/22 1406    12/12/22 2130  fluconazole (DIFLUCAN) 40 MG/ML suspension 100 mg  Status:  Discontinued        100 mg Oral Daily 12/12/22 2040 12/12/22 2043   12/12/22 2130  fluconazole (DIFLUCAN) tablet 100 mg        100 mg Per Tube Daily 12/12/22 2043 12/18/22 1029   12/11/22 0930  levofloxacin (LEVAQUIN) IVPB 750 mg        750 mg 100 mL/hr over 90 Minutes Intravenous Every 24 hours 12/11/22 0830 12/13/22 1246   12/03/22 1115  piperacillin-tazobactam (ZOSYN) IVPB 3.375 g        3.375 g 12.5 mL/hr over 240 Minutes Intravenous Every 8 hours 12/03/22 1018 12/13/22 0746   12/02/22 1245  ceFEPIme (MAXIPIME) 2 g in sodium chloride 0.9 % 100 mL IVPB  Status:  Discontinued        2 g 200 mL/hr over 30 Minutes Intravenous Every 8 hours 12/02/22 1145 12/03/22 0940   11/05/22 0900  ceFEPIme (MAXIPIME) 2 g in sodium chloride 0.9 % 100 mL IVPB        2 g 200 mL/hr over 30 Minutes Intravenous Every 8 hours 11/05/22 0804 11/12/22 0050   11/01/22 1215  ceFAZolin (ANCEF) IVPB 2g/100 mL premix        2 g 200 mL/hr  over 30 Minutes Intravenous  Once 11/01/22 0851 11/01/22 1232   10/14/22 1000  piperacillin-tazobactam (ZOSYN) IVPB 3.375 g        3.375 g 12.5 mL/hr over 240 Minutes Intravenous Every 8 hours 10/14/22 0951 10/21/22 0556   10/04/22 2200  cefTRIAXone (ROCEPHIN) 2 g in sodium chloride 0.9 % 100 mL IVPB  Status:  Discontinued        2 g 200 mL/hr over 30 Minutes Intravenous Daily at bedtime 10/04/22 0914 10/10/22 0900   10/04/22 1045  oseltamivir (TAMIFLU) 6 MG/ML suspension 75 mg        75 mg Per Tube 2 times daily 10/04/22 0950 10/08/22 2229   10/04/22 0600  cefTRIAXone (ROCEPHIN) 2 g in sodium chloride 0.9 % 100 mL IVPB  Status:  Discontinued        2 g 200 mL/hr over 30 Minutes Intravenous Every 24 hours 10/03/22 1054 10/03/22 1250   10/04/22 0600  azithromycin (ZITHROMAX) 500 mg in sodium chloride 0.9 % 250 mL IVPB  Status:  Discontinued        500 mg 250 mL/hr over 60 Minutes Intravenous Every 24 hours  10/03/22 1054 10/03/22 1250   10/03/22 2200  cefTRIAXone (ROCEPHIN) 1 g in sodium chloride 0.9 % 100 mL IVPB  Status:  Discontinued        1 g 200 mL/hr over 30 Minutes Intravenous Daily at bedtime 10/03/22 1559 10/04/22 0914   10/03/22 2200  metroNIDAZOLE (FLAGYL) IVPB 500 mg  Status:  Discontinued        500 mg 100 mL/hr over 60 Minutes Intravenous Every 12 hours 10/03/22 1559 10/05/22 0812   10/03/22 1400  piperacillin-tazobactam (ZOSYN) IVPB 3.375 g  Status:  Discontinued        3.375 g 12.5 mL/hr over 240 Minutes Intravenous Every 8 hours 10/03/22 1258 10/03/22 1559   10/03/22 0515  cefTRIAXone (ROCEPHIN) 1 g in sodium chloride 0.9 % 100 mL IVPB        1 g 200 mL/hr over 30 Minutes Intravenous  Once 10/03/22 0503 10/03/22 0732   10/03/22 0515  azithromycin (ZITHROMAX) 500 mg in sodium chloride 0.9 % 250 mL IVPB        500 mg 250 mL/hr over 60 Minutes Intravenous  Once 10/03/22 0503 10/03/22 0732       Nutritional status:  Body mass index is 23.88 kg/m.  Nutrition Problem: Moderate Malnutrition Etiology: acute illness Signs/Symptoms: mild fat depletion, moderate fat depletion, severe fat depletion, severe muscle depletion, percent weight loss     Objective: Vitals:   01/17/23 1203 01/17/23 1245  BP:  108/69  Pulse: 93 100  Resp: (!) 28 (!) 24  Temp:    SpO2: 96% 99%    Intake/Output Summary (Last 24 hours) at 01/17/2023 1333 Last data filed at 01/17/2023 1006 Gross per 24 hour  Intake --  Output 1700 ml  Net -1700 ml    Filed Weights   01/14/23 0427 01/15/23 0500 01/16/23 0500  Weight: 65 kg 63 kg 63.1 kg   Weight change:  Body mass index is 23.88 kg/m.   Physical Exam: General exam: Not in distress. Skin: No rashes, lesions or ulcers. HEENT: Atraumatic, normocephalic, no obvious bleeding.  Has trach anteriorly Lungs: Coarse breath sound bilaterally, evident tracheal secretions CVS: No bilateral pedal edema GI/Abd soft, nontender, nondistended, PEG tube  site intact CNS: Tries to open eyes and loud verbal command.  Unable to follow any motor, Psychiatry: Unable to assess because of altered mentation  Data Review: I have personally reviewed the laboratory data and studies available.  F/u labs  Unresulted Labs (From admission, onward)    None       Total time spent in review of labs and imaging, patient evaluation, formulation of plan, documentation and communication with family: 35 minutes  Signed, Terrilee Croak, MD [facility] 01/17/2023

## 2023-01-18 LAB — GLUCOSE, CAPILLARY
Glucose-Capillary: 111 mg/dL — ABNORMAL HIGH (ref 70–99)
Glucose-Capillary: 138 mg/dL — ABNORMAL HIGH (ref 70–99)
Glucose-Capillary: 138 mg/dL — ABNORMAL HIGH (ref 70–99)

## 2023-01-18 NOTE — Progress Notes (Signed)
PROGRESS NOTE  Damon [name]  DOB: 1961/07/18  PCP: [facility] FVO:[id number]  DOA: 10/03/2022  LOS: 107 days  Hospital Day: 108  Brief narrative: Damon [name] is a 62 y.o. male with PMH significant for hypertension, hyperlipidemia tobacco abuse, chronic back pain 12/20, 2023, patient presented to ED for shortness of breath, diagnosed to have influenza A He subsequently had a CODE BLUE called on 12/20, had a protracted hospitalization, timeline of events as below.   Significant Events: 12/18 Flu A positive 12/20 Admitted, bipap, 5 minute code 12/21 Intubated 12/26 MRI brain with hypoxic/anoxic injury 12/28 moved to [facility] for LTM EEG 12/31 family updated by neuro: no chance for meaningful recovery, advised comfort measures 1/8 No significant changes in neuro status 1/16-spoke with wife at length at bedside, Dr. Lindie Spruce also had a long conversation with spouse-desires to continue current aggressive care and desires PEG and trach placed 1/18 perc trach and peg 11/05/2022 transferred to floor 11/08/2022 transition to [facility] with pulmonary following weekly for trach 2/4 recurrent fever, cultures collected. 2/5 changed to cuffless trach 2/18 antibiotics started for Pseudomonas on tracheal aspirate cultre 2/22 concern for myoclonic seizure, neuro reconsulted 2/26 transferred to ICU for secretions.  3/1-3/3: Respiratory stable.  Completed 10 days of Zosyn. Okay for transfer back to progressive floor.  Subjective: Patient was seen and examined this morning.  Tries to open eyes on loud command.  Unable to show any movements.  No family at bedside. No change in status in the last 24 hours.  Assessment and plan: Goals of care IPAL from 11/07/2022, he has been DNR but transitioned back to full code. Remains full code persistent discussion with spouse by different providers have been unsuccessful. Wife has unrealistic expectations   Anoxic brain  injury/myoclonic seizure activity: Neurology had a long discussion with the family on 10/14/2022 that she has sustained irreversible anoxic brain injury that is catastrophic, family was encouraged to move towards comfort measures EEG on 11/16/2022 show myoclonic seizures Patient is currently on Keppra, Depakote and clonazepam Patient has had no neurological improvement in the last 4 weeks.   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  Pulmonary following on a weekly basis tracheostomy was exchanged on 12/13/2022. Has been experiencing significant secretions requiring suctioning   Pseudomonas pneumonia versus tracheitis: Culture on 12/01/2022 Pseudomonas resistant to imipenem. She completed 10-day course of Zosyn on 12/16/2022.  He also received a few days of levofloxacin. Patient is an tobramycin nebulizer treatment.    Recurrent fever/leukocytosis: Has had intermittent fevers throughout hospital stay, received multiple courses of antibiotics. He has completed courses of antibiotics and antifungal last day on 12/18/2022.   Paroxysmal sympathetic hyperactivity/sinus tachycardia Continue metoprolol 200 mg twice a day plus diltiazem low-dose. Continue IV metoprolol as needed for heart rate greater than 110. Heart rate is hard to control will have low threshold for IV metoprolol.  Continue Neurontin.  TSH unremarkable. Echocardiogram shows a low EF without significant valvular abnormalities.   Constipation Laxatives were held due to frequent bowel movements.   Hypotension Continue midodrine blood pressure stable.   Severe protein caloric malnutrition: Continue tube feedings, PEG tube placed on 11/01/2022.   Prediabetes mellitus: A1c of 5.9 not requiring sliding scale for several days continue CBGs check   Sacral decubitus ulcer  stage II    Mobility: Bedbound  Goals of care  Code Status: Full Code     DVT prophylaxis:  enoxaparin (LOVENOX) injection 40 mg Start: 11/01/22 2000   Antimicrobials: Tobramycin nebulization Fluid: None Consultants:  Family Communication: None at bedside  Status: Inpatient Level of care:  Progressive   Needs to continue in-hospital care:  Difficult to place  Prognosis:  Poor  Patient from: Home     Diet:  Diet Order     None       Scheduled Meds:  Chlorhexidine Gluconate Cloth  6 each Topical Daily   clonazePAM  0.5 mg Per Tube BID   diltiazem  60 mg Per Tube Q6H   enoxaparin (LOVENOX) injection  40 mg Subcutaneous Q24H   famotidine  20 mg Per Tube QHS   feeding supplement (PROSource TF20)  60 mL Per Tube BID   fiber supplement (BANATROL TF)  60 mL Per Tube BID   free water  200 mL Per Tube Q8H   gabapentin  100 mg Per Tube Q8H   guaiFENesin  400 mg Per Tube Q8H   leptospermum manuka honey  1 Application Topical QHS   levETIRAcetam  1,500 mg Per Tube BID   metoprolol tartrate  200 mg Per Tube BID   midodrine  5 mg Per Tube Q8H   mouth rinse  15 mL Mouth Rinse 4 times per day   scopolamine  1 patch Transdermal Q72H   valproic acid  500 mg Per Tube Q8H    PRN meds: acetaminophen (TYLENOL) oral liquid 160 mg/5 mL **OR** acetaminophen, ipratropium-albuterol, lip balm, metoprolol tartrate, ondansetron **OR** ondansetron (ZOFRAN) IV, mouth rinse, oxyCODONE   Infusions:   sodium chloride 250 mL (12/13/22 0041)   feeding supplement (OSMOLITE 1.5 CAL) 1,000 mL (01/17/23 2132)    Antimicrobials: Anti-infectives (From admission, onward)    Start     Dose/Rate Route Frequency Ordered Stop   12/13/22 1200  piperacillin-tazobactam (ZOSYN) IVPB 3.375 g        3.375 g 12.5 mL/hr over 240 Minutes Intravenous Every 8 hours 12/13/22 1003 12/16/22 2330   12/13/22 1100  tobramycin (PF) (TOBI) nebulizer solution 300 mg  Status:  Discontinued        300 mg  Nebulization 2 times daily 12/13/22 1010 12/27/22 1406   12/12/22 2130  fluconazole (DIFLUCAN) 40 MG/ML suspension 100 mg  Status:  Discontinued        100 mg Oral Daily 12/12/22 2040 12/12/22 2043   12/12/22 2130  fluconazole (DIFLUCAN) tablet 100 mg        100 mg Per Tube Daily 12/12/22 2043 12/18/22 1029   12/11/22 0930  levofloxacin (LEVAQUIN) IVPB 750 mg        750 mg 100 mL/hr over 90 Minutes Intravenous Every 24 hours 12/11/22 0830 12/13/22 1246   12/03/22 1115  piperacillin-tazobactam (ZOSYN) IVPB 3.375 g        3.375 g 12.5 mL/hr over 240 Minutes Intravenous Every 8 hours 12/03/22 1018 12/13/22 0746   12/02/22 1245  ceFEPIme (MAXIPIME) 2 g in sodium chloride 0.9 % 100 mL IVPB  Status:  Discontinued        2 g 200 mL/hr over 30 Minutes Intravenous Every 8 hours 12/02/22 1145 12/03/22 0940   11/05/22 0900  ceFEPIme (MAXIPIME) 2 g in sodium chloride 0.9 % 100 mL IVPB        2 g 200 mL/hr over 30 Minutes Intravenous Every 8 hours 11/05/22 0804 11/12/22 0050   11/01/22 1215  ceFAZolin (ANCEF) IVPB 2g/100 mL premix  2 g 200 mL/hr over 30 Minutes Intravenous  Once 11/01/22 0851 11/01/22 1232   10/14/22 1000  piperacillin-tazobactam (ZOSYN) IVPB 3.375 g        3.375 g 12.5 mL/hr over 240 Minutes Intravenous Every 8 hours 10/14/22 0951 10/21/22 0556   10/04/22 2200  cefTRIAXone (ROCEPHIN) 2 g in sodium chloride 0.9 % 100 mL IVPB  Status:  Discontinued        2 g 200 mL/hr over 30 Minutes Intravenous Daily at bedtime 10/04/22 0914 10/10/22 0900   10/04/22 1045  oseltamivir (TAMIFLU) 6 MG/ML suspension 75 mg        75 mg Per Tube 2 times daily 10/04/22 0950 10/08/22 2229   10/04/22 0600  cefTRIAXone (ROCEPHIN) 2 g in sodium chloride 0.9 % 100 mL IVPB  Status:  Discontinued        2 g 200 mL/hr over 30 Minutes Intravenous Every 24 hours 10/03/22 1054 10/03/22 1250   10/04/22 0600  azithromycin (ZITHROMAX) 500 mg in sodium chloride 0.9 % 250 mL IVPB  Status:  Discontinued        500  mg 250 mL/hr over 60 Minutes Intravenous Every 24 hours 10/03/22 1054 10/03/22 1250   10/03/22 2200  cefTRIAXone (ROCEPHIN) 1 g in sodium chloride 0.9 % 100 mL IVPB  Status:  Discontinued        1 g 200 mL/hr over 30 Minutes Intravenous Daily at bedtime 10/03/22 1559 10/04/22 0914   10/03/22 2200  metroNIDAZOLE (FLAGYL) IVPB 500 mg  Status:  Discontinued        500 mg 100 mL/hr over 60 Minutes Intravenous Every 12 hours 10/03/22 1559 10/05/22 0812   10/03/22 1400  piperacillin-tazobactam (ZOSYN) IVPB 3.375 g  Status:  Discontinued        3.375 g 12.5 mL/hr over 240 Minutes Intravenous Every 8 hours 10/03/22 1258 10/03/22 1559   10/03/22 0515  cefTRIAXone (ROCEPHIN) 1 g in sodium chloride 0.9 % 100 mL IVPB        1 g 200 mL/hr over 30 Minutes Intravenous  Once 10/03/22 0503 10/03/22 0732   10/03/22 0515  azithromycin (ZITHROMAX) 500 mg in sodium chloride 0.9 % 250 mL IVPB        500 mg 250 mL/hr over 60 Minutes Intravenous  Once 10/03/22 0503 10/03/22 0732       Nutritional status:  Body mass index is 23.88 kg/m.  Nutrition Problem: Moderate Malnutrition Etiology: acute illness Signs/Symptoms: mild fat depletion, moderate fat depletion, severe fat depletion, severe muscle depletion, percent weight loss     Objective: Vitals:   01/18/23 0841 01/18/23 1138  BP:  99/71  Pulse: (!) 112 97  Resp: (!) 25 (!) 24  Temp:  98.9 F (37.2 C)  SpO2: 97% 98%    Intake/Output Summary (Last 24 hours) at 01/18/2023 1148 Last data filed at 01/18/2023 0046 Gross per 24 hour  Intake --  Output 800 ml  Net -800 ml    Filed Weights   01/14/23 0427 01/15/23 0500 01/16/23 0500  Weight: 65 kg 63 kg 63.1 kg   Weight change:  Body mass index is 23.88 kg/m.   Physical Exam: General exam: Not in distress. Skin: No rashes, lesions or ulcers. HEENT: Atraumatic, normocephalic, no obvious bleeding.  Has trach anteriorly Lungs: Coarse breath sound bilaterally, evident tracheal secretions CVS:  No bilateral pedal edema GI/Abd soft, nontender, nondistended, PEG tube site intact CNS: Tries to open eyes and loud verbal command.  Unable to follow any motor,  Psychiatry: Unable to assess because of altered mentation  Data Review: I have personally reviewed the laboratory data and studies available.  F/u labs  Unresulted Labs (From admission, onward)    None       Total time spent in review of labs and imaging, patient evaluation, formulation of plan, documentation and communication with family: 35 minutes  Signed, Lorin GlassBinaya Jhana Giarratano, MD [facility] 01/18/2023

## 2023-01-18 NOTE — Progress Notes (Signed)
CSW spoke with patient's wife who states she completed the tour at [facility] and is willing to accept the bed offer after her other daughter speaks with admissions director Marylene Land) to ask additional questions.  CSW initiated insurance authorization via [employer] portal.  Edwin Dada, MSW, LCSW Transitions of Care  Clinical Social Worker II [phone]

## 2023-01-19 LAB — GLUCOSE, CAPILLARY
Glucose-Capillary: 124 mg/dL — ABNORMAL HIGH (ref 70–99)
Glucose-Capillary: 138 mg/dL — ABNORMAL HIGH (ref 70–99)

## 2023-01-19 NOTE — Progress Notes (Signed)
Patient's insurance authorization is still pending at this time.  Ronie Fleeger, MSW, LCSW Transitions of Care  Clinical Social Worker II [phone]  

## 2023-01-19 NOTE — Progress Notes (Signed)
PROGRESS NOTE  Damon [name]  DOB: 01/10/1961  PCP: [facility] RXY:[id number]  DOA: 10/03/2022  LOS: 108 days  Hospital Day: 109  Brief narrative: Damon [name] is a 62 y.o. male with PMH significant for hypertension, hyperlipidemia tobacco abuse, chronic back pain 12/20, 2023, patient presented to ED for shortness of breath, diagnosed to have influenza A He subsequently had a CODE BLUE called on 12/20, had a protracted hospitalization, timeline of events as below.   Significant Events: 12/18 Flu A positive 12/20 Admitted, bipap, 5 minute code 12/21 Intubated 12/26 MRI brain with hypoxic/anoxic injury 12/28 moved to [facility] for LTM EEG 12/31 family updated by neuro: no chance for meaningful recovery, advised comfort measures 1/8 No significant changes in neuro status 1/16-spoke with wife at length at bedside, Dr. Lindie Spruce also had a long conversation with spouse-desires to continue current aggressive care and desires PEG and trach placed 1/18 perc trach and peg 11/05/2022 transferred to floor 11/08/2022 transition to [facility] with pulmonary following weekly for trach 2/4 recurrent fever, cultures collected. 2/5 changed to cuffless trach 2/18 antibiotics started for Pseudomonas on tracheal aspirate cultre 2/22 concern for myoclonic seizure, neuro reconsulted 2/26 transferred to ICU for secretions.  3/1-3/3: Respiratory stable.  Completed 10 days of Zosyn. Okay for transfer back to progressive floor.  Subjective: Patient was seen and examined this morning.  Opens eyes on verbal command.  Coughing.  Trach dependent No family at bedside. Noted from caseworkers noted wife to go to the facility yesterday No significant change in last several days.  Assessment and plan: Goals of care IPAL from 11/07/2022, he has been DNR but transitioned back to full code. Remains full code persistent discussion with spouse by different providers have been unsuccessful. 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  Pulmonary following on a weekly basis tracheostomy was exchanged on 12/13/2022. Has been experiencing significant secretions requiring suctioning   Pseudomonas pneumonia versus tracheitis: Culture on 12/01/2022 Pseudomonas resistant to imipenem. She completed 10-day course of Zosyn on 12/16/2022.  He also received a few days of levofloxacin. Patient is an tobramycin nebulizer treatment.    Recurrent fever/leukocytosis: Has had intermittent fevers throughout hospital stay, received multiple courses of antibiotics. He has completed courses of antibiotics and antifungal last day on 12/18/2022.   Paroxysmal sympathetic hyperactivity/sinus tachycardia Continue metoprolol 200 mg twice a day plus diltiazem low-dose. Continue IV metoprolol as needed for heart rate greater than 110. Heart rate is hard to control will have low threshold for IV metoprolol.  Continue Neurontin.  TSH unremarkable. Echocardiogram shows a low EF without significant valvular abnormalities.   Constipation Laxatives were held due to frequent bowel movements.   Hypotension Continue midodrine blood pressure stable.   Severe protein caloric malnutrition: Continue tube feedings, PEG tube placed on 11/01/2022.   Prediabetes mellitus: A1c of 5.9 not requiring sliding scale for several days  continue CBGs check   Sacral decubitus ulcer stage II    Mobility: Bedbound  Goals of care   Code Status: Full Code     DVT prophylaxis:  enoxaparin (LOVENOX) injection 40 mg Start: 11/01/22 2000   Antimicrobials: Tobramycin nebulization Fluid: None Consultants:  Family Communication: None at bedside  Status: Inpatient Level of care:  Progressive   Needs to continue in-hospital care:  Difficult to place  Prognosis:  Poor  Patient from: Home     Diet:  Diet Order     None       Scheduled Meds:  Chlorhexidine Gluconate Cloth  6 each Topical Daily   clonazePAM  0.5 mg Per Tube BID   diltiazem  60 mg Per Tube Q6H   enoxaparin (LOVENOX) injection  40 mg Subcutaneous Q24H   famotidine  20 mg Per Tube QHS   feeding supplement (PROSource TF20)  60 mL Per Tube BID   fiber supplement (BANATROL TF)  60 mL Per Tube BID   free water  200 mL Per Tube Q8H   gabapentin  100 mg Per Tube Q8H   guaiFENesin  400 mg Per Tube Q8H   leptospermum manuka honey  1 Application Topical QHS   levETIRAcetam  1,500 mg Per Tube BID   metoprolol tartrate  200 mg Per Tube BID   midodrine  5 mg Per Tube Q8H   mouth rinse  15 mL Mouth Rinse 4 times per day   scopolamine  1 patch Transdermal Q72H   valproic acid  500 mg Per Tube Q8H    PRN meds: acetaminophen (TYLENOL) oral liquid 160 mg/5 mL **OR** acetaminophen, ipratropium-albuterol, lip balm, metoprolol tartrate, ondansetron **OR** ondansetron (ZOFRAN) IV, mouth rinse, oxyCODONE   Infusions:   sodium chloride 250 mL (12/13/22 0041)   feeding supplement (OSMOLITE 1.5 CAL) 1,000 mL (01/19/23 0523)    Antimicrobials: Anti-infectives (From admission, onward)    Start     Dose/Rate Route Frequency Ordered Stop   12/13/22 1200  piperacillin-tazobactam (ZOSYN) IVPB 3.375 g        3.375 g 12.5 mL/hr over 240 Minutes Intravenous Every 8 hours 12/13/22 1003 12/16/22 2330   12/13/22 1100  tobramycin (PF) (TOBI) nebulizer solution 300  mg  Status:  Discontinued        300 mg Nebulization 2 times daily 12/13/22 1010 12/27/22 1406   12/12/22 2130  fluconazole (DIFLUCAN) 40 MG/ML suspension 100 mg  Status:  Discontinued        100 mg Oral Daily 12/12/22 2040 12/12/22 2043   12/12/22 2130  fluconazole (DIFLUCAN) tablet 100 mg        100 mg Per Tube Daily 12/12/22 2043 12/18/22 1029   12/11/22 0930  levofloxacin (LEVAQUIN) IVPB 750 mg        750 mg 100 mL/hr over 90 Minutes Intravenous Every 24 hours 12/11/22 0830 12/13/22 1246   12/03/22 1115  piperacillin-tazobactam (ZOSYN) IVPB 3.375 g        3.375 g 12.5 mL/hr over 240 Minutes Intravenous Every 8 hours 12/03/22 1018 12/13/22 0746   12/02/22 1245  ceFEPIme (MAXIPIME) 2 g in sodium chloride 0.9 % 100 mL IVPB  Status:  Discontinued        2 g 200 mL/hr over 30 Minutes Intravenous Every 8 hours 12/02/22 1145 12/03/22 0940   11/05/22 0900  ceFEPIme (MAXIPIME) 2 g in sodium chloride 0.9 % 100 mL IVPB        2 g 200 mL/hr over 30 Minutes Intravenous Every 8 hours 11/05/22 0804 11/12/22 0050   11/01/22 1215  ceFAZolin (ANCEF) IVPB  2g/100 mL premix        2 g 200 mL/hr over 30 Minutes Intravenous  Once 11/01/22 0851 11/01/22 1232   10/14/22 1000  piperacillin-tazobactam (ZOSYN) IVPB 3.375 g        3.375 g 12.5 mL/hr over 240 Minutes Intravenous Every 8 hours 10/14/22 0951 10/21/22 0556   10/04/22 2200  cefTRIAXone (ROCEPHIN) 2 g in sodium chloride 0.9 % 100 mL IVPB  Status:  Discontinued        2 g 200 mL/hr over 30 Minutes Intravenous Daily at bedtime 10/04/22 0914 10/10/22 0900   10/04/22 1045  oseltamivir (TAMIFLU) 6 MG/ML suspension 75 mg        75 mg Per Tube 2 times daily 10/04/22 0950 10/08/22 2229   10/04/22 0600  cefTRIAXone (ROCEPHIN) 2 g in sodium chloride 0.9 % 100 mL IVPB  Status:  Discontinued        2 g 200 mL/hr over 30 Minutes Intravenous Every 24 hours 10/03/22 1054 10/03/22 1250   10/04/22 0600  azithromycin (ZITHROMAX) 500 mg in sodium chloride 0.9 % 250  mL IVPB  Status:  Discontinued        500 mg 250 mL/hr over 60 Minutes Intravenous Every 24 hours 10/03/22 1054 10/03/22 1250   10/03/22 2200  cefTRIAXone (ROCEPHIN) 1 g in sodium chloride 0.9 % 100 mL IVPB  Status:  Discontinued        1 g 200 mL/hr over 30 Minutes Intravenous Daily at bedtime 10/03/22 1559 10/04/22 0914   10/03/22 2200  metroNIDAZOLE (FLAGYL) IVPB 500 mg  Status:  Discontinued        500 mg 100 mL/hr over 60 Minutes Intravenous Every 12 hours 10/03/22 1559 10/05/22 0812   10/03/22 1400  piperacillin-tazobactam (ZOSYN) IVPB 3.375 g  Status:  Discontinued        3.375 g 12.5 mL/hr over 240 Minutes Intravenous Every 8 hours 10/03/22 1258 10/03/22 1559   10/03/22 0515  cefTRIAXone (ROCEPHIN) 1 g in sodium chloride 0.9 % 100 mL IVPB        1 g 200 mL/hr over 30 Minutes Intravenous  Once 10/03/22 0503 10/03/22 0732   10/03/22 0515  azithromycin (ZITHROMAX) 500 mg in sodium chloride 0.9 % 250 mL IVPB        500 mg 250 mL/hr over 60 Minutes Intravenous  Once 10/03/22 0503 10/03/22 0732       Nutritional status:  Body mass index is 23.88 kg/m.  Nutrition Problem: Moderate Malnutrition Etiology: acute illness Signs/Symptoms: mild fat depletion, moderate fat depletion, severe fat depletion, severe muscle depletion, percent weight loss     Objective: Vitals:   01/19/23 0850 01/19/23 0935  BP:  113/80  Pulse: (!) 109   Resp: (!) 24   Temp:    SpO2:      Intake/Output Summary (Last 24 hours) at 01/19/2023 1012 Last data filed at 01/19/2023 0306 Gross per 24 hour  Intake --  Output 500 ml  Net -500 ml    Filed Weights   01/14/23 0427 01/15/23 0500 01/16/23 0500  Weight: 65 kg 63 kg 63.1 kg   Weight change:  Body mass index is 23.88 kg/m.   Physical Exam: General exam: Not in distress. Skin: No rashes, lesions or ulcers. HEENT: Atraumatic, normocephalic, no obvious bleeding.  Has trach anteriorly Lungs: Coarse breath sound bilaterally, evident tracheal  secretions CVS: No bilateral pedal edema GI/Abd soft, nontender, nondistended, PEG tube site intact CNS: Tries to open eyes and loud verbal command.  Unable to follow any motor, Psychiatry: Unable to assess because of altered mentation  Data Review: I have personally reviewed the laboratory data and studies available.  F/u labs  Unresulted Labs (From admission, onward)    None       Total time spent in review of labs and imaging, patient evaluation, formulation of plan, documentation and communication with family: 35 minutes  Signed, Lorin GlassBinaya Jorgia Manthei, MD [facility] 01/19/2023

## 2023-01-20 LAB — GLUCOSE, CAPILLARY
Glucose-Capillary: 113 mg/dL — ABNORMAL HIGH (ref 70–99)
Glucose-Capillary: 129 mg/dL — ABNORMAL HIGH (ref 70–99)
Glucose-Capillary: 121 mg/dL — ABNORMAL HIGH (ref 70–99)
Glucose-Capillary: 123 mg/dL — ABNORMAL HIGH (ref 70–99)

## 2023-01-20 MED ORDER — GERHARDT'S BUTT CREAM
TOPICAL_CREAM | CUTANEOUS | Status: DC | PRN
  Filled 2023-01-20: qty 1

## 2023-01-20 NOTE — Progress Notes (Signed)
PROGRESS NOTE  Damon [name]  DOB: May 29, 1961  PCP: [facility] KPT:[id number]  DOA: 10/03/2022  LOS: 109 days  Hospital Day: 110  Brief narrative: Damon [name] is a 62 y.o. male with PMH significant for hypertension, hyperlipidemia tobacco abuse, chronic back pain 12/20, 2023, patient presented to ED for shortness of breath, diagnosed to have influenza A He subsequently had a CODE BLUE called on 12/20, had a protracted hospitalization, timeline of events as below.   Significant Events: 12/18 Flu A positive 12/20 Admitted, bipap, 5 minute code 12/21 Intubated 12/26 MRI brain with hypoxic/anoxic injury 12/28 moved to [facility] for LTM EEG 12/31 family updated by neuro: no chance for meaningful recovery, advised comfort measures 1/8 No significant changes in neuro status 1/16-spoke with wife at length at bedside, Dr. Lindie Spruce also had a long conversation with spouse-desires to continue current aggressive care and desires PEG and trach placed 1/18 perc trach and peg 11/05/2022 transferred to floor 11/08/2022 transition to [facility] with pulmonary following weekly for trach 2/4 recurrent fever, cultures collected. 2/5 changed to cuffless trach 2/18 antibiotics started for Pseudomonas on tracheal aspirate cultre 2/22 concern for myoclonic seizure, neuro reconsulted 2/26 transferred to ICU for secretions.  3/1-3/3: Respiratory stable.  Completed 10 days of Zosyn. Okay for transfer back to progressive floor.  Subjective: Patient was seen and examined this morning.  Opens eyes on verbal command.  Coughing.  Trach dependent No family at bedside.  Assessment and plan: Goals of care IPAL from 11/07/2022, he has been DNR but transitioned back to full code. Remains full code persistent discussion with spouse by different providers have been unsuccessful. 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  Pulmonary following on a weekly basis tracheostomy was exchanged on 12/13/2022. Has been experiencing significant secretions requiring suctioning   Pseudomonas pneumonia versus tracheitis: Culture on 12/01/2022 Pseudomonas resistant to imipenem. She completed 10-day course of Zosyn on 12/16/2022.  He also received a few days of levofloxacin. Patient is an tobramycin nebulizer treatment.    Recurrent fever/leukocytosis: Has had intermittent fevers throughout hospital stay, received multiple courses of antibiotics. He has completed courses of antibiotics and antifungal last day on 12/18/2022.   Paroxysmal sympathetic hyperactivity/sinus tachycardia Continue metoprolol 200 mg twice a day plus diltiazem low-dose. Continue IV metoprolol as needed for heart rate greater than 110. Heart rate is hard to control will have low threshold for IV metoprolol.  Continue Neurontin.  TSH unremarkable. Echocardiogram shows a low EF without significant valvular abnormalities.   Constipation Laxatives were held due to frequent bowel movements.   Hypotension Continue midodrine blood pressure stable.   Severe protein caloric malnutrition: Continue tube feedings, PEG tube placed on 11/01/2022.   Prediabetes mellitus: A1c of 5.9 not requiring sliding scale for several days continue CBGs check   Sacral decubitus ulcer stage II    Mobility: Bedbound  Goals of care   Code  Status: Full Code     DVT prophylaxis:  enoxaparin (LOVENOX)  injection 40 mg Start: 11/01/22 2000   Antimicrobials: Tobramycin nebulization Fluid: None Consultants:  Family Communication: None at bedside  Status: Inpatient Level of care:  Progressive   Needs to continue in-hospital care:  Difficult to place  Prognosis:  Poor  Patient from: Home     Diet:  Diet Order     None       Scheduled Meds:  Chlorhexidine Gluconate Cloth  6 each Topical Daily   clonazePAM  0.5 mg Per Tube BID   diltiazem  60 mg Per Tube Q6H   enoxaparin (LOVENOX) injection  40 mg Subcutaneous Q24H   famotidine  20 mg Per Tube QHS   feeding supplement (PROSource TF20)  60 mL Per Tube BID   fiber supplement (BANATROL TF)  60 mL Per Tube BID   free water  200 mL Per Tube Q8H   gabapentin  100 mg Per Tube Q8H   guaiFENesin  400 mg Per Tube Q8H   leptospermum manuka honey  1 Application Topical QHS   levETIRAcetam  1,500 mg Per Tube BID   metoprolol tartrate  200 mg Per Tube BID   midodrine  5 mg Per Tube Q8H   mouth rinse  15 mL Mouth Rinse 4 times per day   scopolamine  1 patch Transdermal Q72H   valproic acid  500 mg Per Tube Q8H    PRN meds: acetaminophen (TYLENOL) oral liquid 160 mg/5 mL **OR** acetaminophen, Gerhardt's butt cream, ipratropium-albuterol, lip balm, metoprolol tartrate, ondansetron **OR** ondansetron (ZOFRAN) IV, mouth rinse, oxyCODONE   Infusions:   sodium chloride 250 mL (12/13/22 0041)   feeding supplement (OSMOLITE 1.5 CAL) 65 mL/hr at 01/20/23 0736    Antimicrobials: Anti-infectives (From admission, onward)    Start     Dose/Rate Route Frequency Ordered Stop   12/13/22 1200  piperacillin-tazobactam (ZOSYN) IVPB 3.375 g        3.375 g 12.5 mL/hr over 240 Minutes Intravenous Every 8 hours 12/13/22 1003 12/16/22 2330   12/13/22 1100  tobramycin (PF) (TOBI) nebulizer solution 300 mg  Status:  Discontinued        300 mg Nebulization 2 times daily 12/13/22 1010  12/27/22 1406   12/12/22 2130  fluconazole (DIFLUCAN) 40 MG/ML suspension 100 mg  Status:  Discontinued        100 mg Oral Daily 12/12/22 2040 12/12/22 2043   12/12/22 2130  fluconazole (DIFLUCAN) tablet 100 mg        100 mg Per Tube Daily 12/12/22 2043 12/18/22 1029   12/11/22 0930  levofloxacin (LEVAQUIN) IVPB 750 mg        750 mg 100 mL/hr over 90 Minutes Intravenous Every 24 hours 12/11/22 0830 12/13/22 1246   12/03/22 1115  piperacillin-tazobactam (ZOSYN) IVPB 3.375 g        3.375 g 12.5 mL/hr over 240 Minutes Intravenous Every 8 hours 12/03/22 1018 12/13/22 0746   12/02/22 1245  ceFEPIme (MAXIPIME) 2 g in sodium chloride 0.9 % 100 mL IVPB  Status:  Discontinued        2 g 200 mL/hr over 30 Minutes Intravenous Every 8 hours 12/02/22 1145 12/03/22 0940   11/05/22 0900  ceFEPIme (MAXIPIME) 2 g in sodium chloride 0.9 % 100 mL IVPB        2 g 200 mL/hr over 30 Minutes Intravenous Every 8 hours 11/05/22 0804 11/12/22 0050   11/01/22 1215  ceFAZolin (ANCEF) IVPB 2g/100 mL premix        2 g 200 mL/hr  over 30 Minutes Intravenous  Once 11/01/22 0851 11/01/22 1232   10/14/22 1000  piperacillin-tazobactam (ZOSYN) IVPB 3.375 g        3.375 g 12.5 mL/hr over 240 Minutes Intravenous Every 8 hours 10/14/22 0951 10/21/22 0556   10/04/22 2200  cefTRIAXone (ROCEPHIN) 2 g in sodium chloride 0.9 % 100 mL IVPB  Status:  Discontinued        2 g 200 mL/hr over 30 Minutes Intravenous Daily at bedtime 10/04/22 0914 10/10/22 0900   10/04/22 1045  oseltamivir (TAMIFLU) 6 MG/ML suspension 75 mg        75 mg Per Tube 2 times daily 10/04/22 0950 10/08/22 2229   10/04/22 0600  cefTRIAXone (ROCEPHIN) 2 g in sodium chloride 0.9 % 100 mL IVPB  Status:  Discontinued        2 g 200 mL/hr over 30 Minutes Intravenous Every 24 hours 10/03/22 1054 10/03/22 1250   10/04/22 0600  azithromycin (ZITHROMAX) 500 mg in sodium chloride 0.9 % 250 mL IVPB  Status:  Discontinued        500 mg 250 mL/hr over 60 Minutes  Intravenous Every 24 hours 10/03/22 1054 10/03/22 1250   10/03/22 2200  cefTRIAXone (ROCEPHIN) 1 g in sodium chloride 0.9 % 100 mL IVPB  Status:  Discontinued        1 g 200 mL/hr over 30 Minutes Intravenous Daily at bedtime 10/03/22 1559 10/04/22 0914   10/03/22 2200  metroNIDAZOLE (FLAGYL) IVPB 500 mg  Status:  Discontinued        500 mg 100 mL/hr over 60 Minutes Intravenous Every 12 hours 10/03/22 1559 10/05/22 0812   10/03/22 1400  piperacillin-tazobactam (ZOSYN) IVPB 3.375 g  Status:  Discontinued        3.375 g 12.5 mL/hr over 240 Minutes Intravenous Every 8 hours 10/03/22 1258 10/03/22 1559   10/03/22 0515  cefTRIAXone (ROCEPHIN) 1 g in sodium chloride 0.9 % 100 mL IVPB        1 g 200 mL/hr over 30 Minutes Intravenous  Once 10/03/22 0503 10/03/22 0732   10/03/22 0515  azithromycin (ZITHROMAX) 500 mg in sodium chloride 0.9 % 250 mL IVPB        500 mg 250 mL/hr over 60 Minutes Intravenous  Once 10/03/22 0503 10/03/22 0732       Nutritional status:  Body mass index is 25.73 kg/m.  Nutrition Problem: Moderate Malnutrition Etiology: acute illness Signs/Symptoms: mild fat depletion, moderate fat depletion, severe fat depletion, severe muscle depletion, percent weight loss     Objective: Vitals:   01/20/23 0911 01/20/23 1129  BP:  104/76  Pulse: (!) 102 92  Resp: (!) 27 (!) 23  Temp:  97.9 F (36.6 C)  SpO2: 97% 99%    Intake/Output Summary (Last 24 hours) at 01/20/2023 1204 Last data filed at 01/20/2023 0736 Gross per 24 hour  Intake 7700.83 ml  Output 1550 ml  Net 6150.83 ml   Filed Weights   01/15/23 0500 01/16/23 0500 01/20/23 0439  Weight: 63 kg 63.1 kg 68 kg   Weight change:  Body mass index is 25.73 kg/m.   Physical Exam: General exam: Not in distress. Skin: No rashes, lesions or ulcers. HEENT: Atraumatic, normocephalic, no obvious bleeding.  Has trach anteriorly Lungs: Coarse breath sound bilaterally, evident tracheal secretions CVS: No bilateral pedal  edema GI/Abd soft, nontender, nondistended, PEG tube site intact CNS: Tries to open eyes and loud verbal command.  Unable to follow any motor, Psychiatry: Unable to assess  because of altered mentation  Data Review: I have personally reviewed the laboratory data and studies available.  F/u labs  Unresulted Labs (From admission, onward)    None       Total time spent in review of labs and imaging, patient evaluation, formulation of plan, documentation and communication with family: 35 minutes  Signed, Lorin GlassBinaya Kaliana Albino, MD [facility] 01/20/2023

## 2023-01-21 DIAGNOSIS — J9601 Acute respiratory failure with hypoxia: Secondary | ICD-10-CM | POA: Diagnosis not present

## 2023-01-21 LAB — GLUCOSE, CAPILLARY
Glucose-Capillary: 118 mg/dL — ABNORMAL HIGH (ref 70–99)
Glucose-Capillary: 120 mg/dL — ABNORMAL HIGH (ref 70–99)
Glucose-Capillary: 120 mg/dL — ABNORMAL HIGH (ref 70–99)
Glucose-Capillary: 122 mg/dL — ABNORMAL HIGH (ref 70–99)
Glucose-Capillary: 135 mg/dL — ABNORMAL HIGH (ref 70–99)

## 2023-01-21 NOTE — Progress Notes (Signed)
NAME:  Damon [name], MRN:  [id number], DOB:  25-Jun-1961, LOS: 110 ADMISSION DATE:  10/03/2022, CONSULTATION DATE:  10/03/2022 REFERRING MD:  Ronaldo Miyamoto - [facility] CHIEF COMPLAINT:  Dyspnea   History of Present Illness:  62 year old man admitted to [facility] 12/20 in the setting of acute hypoxemic respiratory failure due to Flu A. PMHx significant for HTN, HLD, chronic back pain, tobacco abuse   Placed on BIPAP.  Coded (likely in the setting of hypoxia), initial rhythm PEA with CPR x 5 minutes, required intubation.  After several days on mechanical ventilation remains comatose, MRI Brain 12/26 worrisome for anoxic brain injury.  Transferred to [facility] for LTM EEG monitoring.  Neurology consulted. LTM with generalized epileptogenicity. Treated with Propofol, Depakote, Keppra, Ketamine. 12/31 Neurology with goals of care with family. Relayed that patient has a grim neurological recovery due to irreversible anoxic injury. Family wished to continue aggressive measures.   Palliative care consulted. Stay complicated with neuro-storming.   Surgical consult regarding PEG/trach.  Pertinent Medical History:   Past Medical History:  Diagnosis Date   Elevated lipids    Hypertension    Tobacco abuse    Significant Hospital Events: Including procedures, antibiotic start and stop dates in addition to other pertinent events   12/18 Flu A positive 12/20 Admitted, 5 minute code after non-compliance with BiPAP. MRSA PCR - neg, Urine strep > POS 12/21 Intubated, paralyzed, ARDS protocol, on 7cc/kg 12/22 Weaned to 6 cc/kg with dyssynchrony, back on 7 cc/kg, driving pressures good, weaning vent 12/23 Attempt to wean sedation again with dyssynchrony and desaturation.  Some concern for cuff leak, tube exchange.  Cuff did not appear to be blown.  Ongoing signs of cuff leak, query leaking around cough, possible large trachea, diuresing 12/25 tolerating PSV, myoclonus> ceribell, Neuro consult, changed to propofol, diuresed  12/26  Changed to DNR. On Propofol, versed.  Tolerating PSV.  MRI brain with hypoxic/anoxic injury. Blood culture neg 12/27 GPD's on EEG, pending transfer to [facility] for cEEG. NEURO CONSULT 12/28 moved to [facility] for LTM EEG 12/31 family updated by neuro: no chance for meaningful recovery, they should make plans for comfort measures 1/1 cultures - resp normal flora 1/2 eeg still with epileptogenicity. Palli fam mtg -- family does not want to hear from medical teams unless there is "good news"  1/3  cEEG with burst suppression w highly epileptiform bursts.  1/4 cEEG with burst suppression  + highly epileptiform bursts still.  WBC slightly up to 22.5. Temp high 99 low 100  c-EEG stopped due to lack of benefit Pallative care consult - wife upset about early dc from ER on 12/18 Chaplain consult: Wife is sufering from anticipatory grief + accepting god's way + curious/anger about events leading upto current state EEG: generalized epileptogenicity with high potential for seizures as well as profound diffuse encephalopathy. In the setting of cardiac arrest, this EEG pattern is suggestive of anoxic/hypoxic brain injury.  1/5 Low grade fever +. WEBC 22.5K.  on Zosyn. On vent fio2 30%. TF +. Per RN - diprivan stopped yesterday. No myoclonus today. Mild tachycardia + Rpt pall care on 10/21/22 per wife 1/6 On vent FiO2 30%.  Tmax 99,10F ., WBC better. Still off diprivan -> No myoclonus. cXR visualized - devices in place and faiure clear. Oral oxy and klonpin stopped 1/8 No significant changes in neuro status. Tachycardic to 130s, hypertensive to 140s. Coreg transitioned to metoprolol. 1/11 wife considering trach/peg, to discuss with surgery-discussed with Dr. Bedelia Person 1/12 more tachycardic 1/16-spoke with  wife at length at bedside, Dr. Lindie Spruce also had a long conversation with spouse-desires to continue current aggressive care and desires PEG and trach placed 1/18 perc trach and peg 11/05/2022 transferred to floor due to need for  ICU bed 11/08/2022 transition to [facility] with pulmonary following weekly for trach 2/5 ATC 28%, no acute issues with trach  2/26 Copious tracheal sections in the setting of pseudomonas pneumonia resulting in need to move to ICU for close monitoring/ RT care. 2/29 >> Trach change 12/17/2022>> Waiting on Progressive bed 3/18 Cont ATC. Due for trach change   4/8 Remains on ATC with moderate tracheal  secretions   Interim History / Subjective:  No acute events overnight  Objective:  Blood pressure (!) 131/91, pulse (!) 103, temperature 98.5 F (36.9 C), temperature source Oral, resp. rate (!) 26, height 5\' 4"  (1.626 m), weight 68 kg, SpO2 96 %.    FiO2 (%):  [21 %] 21 %   Intake/Output Summary (Last 24 hours) at 01/21/2023 1054 Last data filed at 01/21/2023 1937 Gross per 24 hour  Intake 2204 ml  Output 400 ml  Net 1804 ml   BP (!) 131/91   Pulse (!) 103   Temp 98.5 F (36.9 C) (Oral)   Resp (!) 26   Ht 5\' 4"  (1.626 m)   Wt 68 kg   SpO2 96%   BMI 25.73 kg/m   Filed Weights   01/16/23 0500 01/20/23 0439 01/21/23 0500  Weight: 63.1 kg 68 kg 68 kg   Physical Exam: General: Acute on chronic ill-appearing middle-aged deconditioned male lying in bed on ATC in no acute distress HEENT: Trach midline, MM pink/moist, PERRL,  Neuro: Unresponsive, vegetative state CV: s1s2 regular rate and rhythm, no murmur, rubs, or gallops,  PULM: Rhonchi bilaterally, no increased work of breathing, no added breath sounds on ATC GI: soft, bowel sounds active in all 4 quadrants, non-tender, non-distended, tolerating TF Extremities: warm/dry, no edema  Skin: no rashes or lesions  Resolved   Flu A AKI CAP, pneumococcal pneumonia - s/p Zosyn 12/31 - 1/7; Ceftriaxone 12/20- 12/27, Azithro 12/20, Flagyl 12/20 - 12/22 Vent dependence  Assessment & Plan:  Problems: Anoxic brain injury  Seizures Paroxysmal sympathetic hyperactivity Fever Leukocytosis   Anemia Hyperkalemia Tachycardia/hypotension due to paroxysmal sympathetic hyperactivity Severe protein calorie malnutrition Hyperglycemia    Acute respiratory failure with hypoxia Tracheostomy dependence Hx ARDS, flu and aspiration   Hx pseudomonas PNA  Atelectasis   Plan: Continue ATC Routine trach care Next trach exchange 4/18 Difficult to downsize trach given thick secretions Pulmonary hygiene Repeat chest x-ray as needed SpO2 goal greater than 90  PCCM will see weekly. Please call if needs arise sooner.   Blimi Godby D. Harris, NP-C [facility] Personal contact information can be found on Amion  If no contact or response made please call [phone] 01/21/2023, 11:21 AM

## 2023-01-21 NOTE — Progress Notes (Signed)
PROGRESS NOTE  [name]  DOB: 1961/10/11  PCP: [facility] ERD:[id number]  DOA: 10/03/2022  LOS: 110 days  Hospital Day: 111  Brief narrative: Damon [name] is a 62 y.o. male with PMH significant for hypertension, hyperlipidemia tobacco abuse, chronic back pain. 10/01/2022, patient was diagnosed with flu as an outpatient.  He continued have worsening shortness of breath and hence presented to the ED on 12/20.  Chest x-ray showed bilateral infiltrates. He was started on IV antibiotics, and respiratory support with BiPAP.  Within few, patient went to PEA arrest, resuscitated after roughly about 5 minutes of ACLS. Postcode, he was minimally responsive, no sedation was required for intubation. Admitted to ICU.  He was managed for ARDS due to influenza A and superimposed bacterial pneumonia.  Infection was controlled but his mental status did not improve and several days.  MRI brain 12/26 showed hypoxic/anoxic injury.  Neurology consultation was obtained.  Family was updated about no chance of meaningful recovery and advised for comfort measures.   So far, many providers including Dr. Lindie Spruce has spoken to patient's spouse who has unrealistic expectations and continues to refuse comfort care. 1/18, patient underwent trach and PEG. 2/5 changed to cuffless trach. Currently pending placement.  Subjective: Patient was seen and examined this morning.  Lying on bed.  On trach collar.  Opens eyes on verbal command..  No other response. Pending facility placement  Assessment and plan: Anoxic brain injury Trach/PEG status No meaningful neurological recovery noted since the initial code on 10/03/22  Acute hypoxic respiratory failure ARDS due to influenza A  Superimposed pneumococcal and Pseudomonas pneumonia Completed a course of antiviral and antibiotics.   Remains trach dependent.  On oxygenation by tracheostomy collar Pulmonary following on a weekly basis tracheostomy was  exchanged on 12/13/2022. Has been experiencing significant secretions requiring suctioning.  Continue scopolamine patch. Continue tobramycin nebulizer treatment. No fever in last several days.  WBC count on last check was normal. Recent Labs  Lab 01/16/23 0453  WBC 9.2   Myoclonic seizure activity EEG on 11/16/2022 show myoclonic seizures Patient is currently on Keppra, Depakote, clonazepam and Neurontin   Acute systolic CHF Sinus tachycardia Hypotension Heart rate persistently close to 100, blood pressure close to 90s. Currently on Lopressor 200 mg twice daily, Cardizem 60 mg every 6 hours, Lopressor IV as needed Also on midodrine 5 mg 3 times daily for blood pressure support. Echocardiogram 3/4 with basal inferior/inferolateral and septal hypokinesis EF 50 to 55% Continue to monitor  Prediabetes A1c 5.9 on 12/04/2021 Currently on sliding scale insulin with Accu-Cheks Recent Labs  Lab 01/20/23 0810 01/20/23 1128 01/20/23 1529 01/21/23 0024 01/21/23 0836  GLUCAP 129* 121* 123* 135* 120*   Severe protein caloric malnutrition Continue tube feedings Continue monitor bowel function.  Currently on Banatrol twice daily    Sacral decubitus ulcer stage II Continue wound care.    Mobility: Bedbound  Goals of care   Code Status: Full Code  IPAL from 11/07/2022, he was DNR but again transitioned back to full code.    DVT prophylaxis:  enoxaparin (LOVENOX) injection 40 mg Start: 11/01/22 2000   Antimicrobials: Tobramycin nebulization Fluid: None Consultants:  Family Communication: None at bedside  Status: Inpatient Level of care:  Progressive   Needs to continue in-hospital care:  Difficult to place.  I called for peer to peer this morning.  He has been approved for 3 to 4 days of maintenance training at the facility after which he will be a long-term  resident there.  Prognosis:  Poor  Patient from: Home     Diet:  Diet Order     None       Scheduled  Meds:  Chlorhexidine Gluconate Cloth  6 each Topical Daily   clonazePAM  0.5 mg Per Tube BID   diltiazem  60 mg Per Tube Q6H   enoxaparin (LOVENOX) injection  40 mg Subcutaneous Q24H   famotidine  20 mg Per Tube QHS   feeding supplement (PROSource TF20)  60 mL Per Tube BID   fiber supplement (BANATROL TF)  60 mL Per Tube BID   free water  200 mL Per Tube Q8H   gabapentin  100 mg Per Tube Q8H   guaiFENesin  400 mg Per Tube Q8H   leptospermum manuka honey  1 Application Topical QHS   levETIRAcetam  1,500 mg Per Tube BID   metoprolol tartrate  200 mg Per Tube BID   midodrine  5 mg Per Tube Q8H   mouth rinse  15 mL Mouth Rinse 4 times per day   scopolamine  1 patch Transdermal Q72H   valproic acid  500 mg Per Tube Q8H    PRN meds: acetaminophen (TYLENOL) oral liquid 160 mg/5 mL **OR** acetaminophen, Gerhardt's butt cream, ipratropium-albuterol, lip balm, metoprolol tartrate, ondansetron **OR** ondansetron (ZOFRAN) IV, mouth rinse, oxyCODONE   Infusions:   sodium chloride 250 mL (12/13/22 0041)   feeding supplement (OSMOLITE 1.5 CAL) 65 mL/hr at 01/21/23 1229    Antimicrobials: Anti-infectives (From admission, onward)    Start     Dose/Rate Route Frequency Ordered Stop   12/13/22 1200  piperacillin-tazobactam (ZOSYN) IVPB 3.375 g        3.375 g 12.5 mL/hr over 240 Minutes Intravenous Every 8 hours 12/13/22 1003 12/16/22 2330   12/13/22 1100  tobramycin (PF) (TOBI) nebulizer solution 300 mg  Status:  Discontinued        300 mg Nebulization 2 times daily 12/13/22 1010 12/27/22 1406   12/12/22 2130  fluconazole (DIFLUCAN) 40 MG/ML suspension 100 mg  Status:  Discontinued        100 mg Oral Daily 12/12/22 2040 12/12/22 2043   12/12/22 2130  fluconazole (DIFLUCAN) tablet 100 mg        100 mg Per Tube Daily 12/12/22 2043 12/18/22 1029   12/11/22 0930  levofloxacin (LEVAQUIN) IVPB 750 mg        750 mg 100 mL/hr over 90 Minutes Intravenous Every 24 hours 12/11/22 0830 12/13/22 1246    12/03/22 1115  piperacillin-tazobactam (ZOSYN) IVPB 3.375 g        3.375 g 12.5 mL/hr over 240 Minutes Intravenous Every 8 hours 12/03/22 1018 12/13/22 0746   12/02/22 1245  ceFEPIme (MAXIPIME) 2 g in sodium chloride 0.9 % 100 mL IVPB  Status:  Discontinued        2 g 200 mL/hr over 30 Minutes Intravenous Every 8 hours 12/02/22 1145 12/03/22 0940   11/05/22 0900  ceFEPIme (MAXIPIME) 2 g in sodium chloride 0.9 % 100 mL IVPB        2 g 200 mL/hr over 30 Minutes Intravenous Every 8 hours 11/05/22 0804 11/12/22 0050   11/01/22 1215  ceFAZolin (ANCEF) IVPB 2g/100 mL premix        2 g 200 mL/hr over 30 Minutes Intravenous  Once 11/01/22 0851 11/01/22 1232   10/14/22 1000  piperacillin-tazobactam (ZOSYN) IVPB 3.375 g        3.375 g 12.5 mL/hr over 240 Minutes Intravenous Every  8 hours 10/14/22 0951 10/21/22 0556   10/04/22 2200  cefTRIAXone (ROCEPHIN) 2 g in sodium chloride 0.9 % 100 mL IVPB  Status:  Discontinued        2 g 200 mL/hr over 30 Minutes Intravenous Daily at bedtime 10/04/22 0914 10/10/22 0900   10/04/22 1045  oseltamivir (TAMIFLU) 6 MG/ML suspension 75 mg        75 mg Per Tube 2 times daily 10/04/22 0950 10/08/22 2229   10/04/22 0600  cefTRIAXone (ROCEPHIN) 2 g in sodium chloride 0.9 % 100 mL IVPB  Status:  Discontinued        2 g 200 mL/hr over 30 Minutes Intravenous Every 24 hours 10/03/22 1054 10/03/22 1250   10/04/22 0600  azithromycin (ZITHROMAX) 500 mg in sodium chloride 0.9 % 250 mL IVPB  Status:  Discontinued        500 mg 250 mL/hr over 60 Minutes Intravenous Every 24 hours 10/03/22 1054 10/03/22 1250   10/03/22 2200  cefTRIAXone (ROCEPHIN) 1 g in sodium chloride 0.9 % 100 mL IVPB  Status:  Discontinued        1 g 200 mL/hr over 30 Minutes Intravenous Daily at bedtime 10/03/22 1559 10/04/22 0914   10/03/22 2200  metroNIDAZOLE (FLAGYL) IVPB 500 mg  Status:  Discontinued        500 mg 100 mL/hr over 60 Minutes Intravenous Every 12 hours 10/03/22 1559 10/05/22 0812    10/03/22 1400  piperacillin-tazobactam (ZOSYN) IVPB 3.375 g  Status:  Discontinued        3.375 g 12.5 mL/hr over 240 Minutes Intravenous Every 8 hours 10/03/22 1258 10/03/22 1559   10/03/22 0515  cefTRIAXone (ROCEPHIN) 1 g in sodium chloride 0.9 % 100 mL IVPB        1 g 200 mL/hr over 30 Minutes Intravenous  Once 10/03/22 0503 10/03/22 0732   10/03/22 0515  azithromycin (ZITHROMAX) 500 mg in sodium chloride 0.9 % 250 mL IVPB        500 mg 250 mL/hr over 60 Minutes Intravenous  Once 10/03/22 0503 10/03/22 0732       Nutritional status:  Body mass index is 25.73 kg/m.  Nutrition Problem: Moderate Malnutrition Etiology: acute illness Signs/Symptoms: mild fat depletion, moderate fat depletion, severe fat depletion, severe muscle depletion, percent weight loss     Objective: Vitals:   01/21/23 1159 01/21/23 1205  BP: (!) 131/91 113/87  Pulse: 94 94  Resp: (!) 29 (!) 30  Temp: 99.6 F (37.6 C)   SpO2: 96% 98%    Intake/Output Summary (Last 24 hours) at 01/21/2023 1311 Last data filed at 01/21/2023 1229 Gross per 24 hour  Intake 2677.42 ml  Output 900 ml  Net 1777.42 ml   Filed Weights   01/16/23 0500 01/20/23 0439 01/21/23 0500  Weight: 63.1 kg 68 kg 68 kg   Weight change: 0 kg Body mass index is 25.73 kg/m.   Physical Exam: General exam: Not in distress. Skin: No rashes, lesions or ulcers. HEENT: Atraumatic, normocephalic, no obvious bleeding.  Has trach anteriorly Lungs: Coarse breath sound bilaterally, evident tracheal secretions CVS: No bilateral pedal edema GI/Abd soft, nontender, nondistended, PEG tube site intact CNS: Tries to open eyes and loud verbal command.  Unable to follow any motor, Psychiatry: Unable to assess because of altered mentation  Data Review: I have personally reviewed the laboratory data and studies available.  F/u labs  Unresulted Labs (From admission, onward)    None  Total time spent in review of labs and imaging, patient  evaluation, formulation of plan, documentation and communication with family: 45 minutes  Signed, Lorin GlassBinaya Fiorella Hanahan, MD [facility] 01/21/2023

## 2023-01-21 NOTE — Progress Notes (Addendum)
2:55pm: CSW spoke with Marylene Land who states the facility can accept the patient tomorrow after 2pm.  CSW spoke with patient's wife [name] to inform her of discharge plan.   CSW spoke with [facility] to schedule transportation for tomorrow at 2pm.  1:40pm CSW spoke with Lawson Fiscal at [facility] to discuss patient's insurance authorization approval. Lawson Fiscal states this approval states "hold" due to the managing company [employer]) and does not restrict any billing or payment processes.   CSW spoke Marylene Land at [facility] who states the facility can accept the patient. Marylene Land states she needs to determine when the equipment will be ready and will return call to CSW with updates.  10:40am: Patient's insurance authorization has been approved but is on "hold" until the patient admits to a facility.  CSW spoke with Marylene Land at [facility] who states the facility cannot accept the patient due to the financial risk. Marylene Land states the facility is not able to accept an LOG.  9am: CSW received call from staff at [facility] requesting a peer to peer be completed by 1pm today. CSW notified Dr. Pola Corn of information.  8am: Patient's insurance authorization is still pending at this time.  Edwin Dada, MSW, LCSW Transitions of Care  Clinical Social Worker II [phone]

## 2023-01-22 LAB — GLUCOSE, CAPILLARY: Glucose-Capillary: 94 mg/dL (ref 70–99)

## 2023-01-22 MED ORDER — IPRATROPIUM-ALBUTEROL 0.5-2.5 (3) MG/3ML IN SOLN: 3.0000 mL | Freq: Four times a day (QID) | RESPIRATORY_TRACT | Status: AC | PRN

## 2023-01-22 MED ORDER — FREE WATER: 200.0000 mL | Freq: Three times a day (TID) | Status: AC

## 2023-01-22 MED ORDER — ONDANSETRON HCL 4 MG PO TABS: 4.0000 mg | ORAL_TABLET | Freq: Four times a day (QID) | ORAL | 0 refills | Status: AC | PRN

## 2023-01-22 MED ORDER — METOPROLOL TARTRATE 100 MG PO TABS: 200.0000 mg | ORAL_TABLET | Freq: Two times a day (BID) | ORAL | Status: AC

## 2023-01-22 MED ORDER — CHLORHEXIDINE GLUCONATE CLOTH 2 % EX PADS: 6.0000 | MEDICATED_PAD | Freq: Every day | CUTANEOUS | Status: AC

## 2023-01-22 MED ORDER — OSMOLITE 1.5 CAL PO LIQD: 1000.0000 mL | ORAL | 0 refills | Status: AC

## 2023-01-22 MED ORDER — MIDODRINE HCL 5 MG PO TABS: 5.0000 mg | ORAL_TABLET | Freq: Three times a day (TID) | ORAL | Status: AC

## 2023-01-22 MED ORDER — PROSOURCE TF20 ENFIT COMPATIBL EN LIQD: 60.0000 mL | Freq: Two times a day (BID) | ENTERAL | Status: AC

## 2023-01-22 MED ORDER — SCOPOLAMINE 1 MG/3DAYS TD PT72: 1.0000 | MEDICATED_PATCH | TRANSDERMAL | 0 refills | Status: AC

## 2023-01-22 MED ORDER — DILTIAZEM HCL 60 MG PO TABS: 60.0000 mg | ORAL_TABLET | Freq: Four times a day (QID) | ORAL | Status: AC

## 2023-01-22 MED ORDER — CLONAZEPAM 0.5 MG PO TABS: 0.5000 mg | ORAL_TABLET | Freq: Two times a day (BID) | ORAL | 0 refills | Status: AC

## 2023-01-22 MED ORDER — OXYCODONE HCL 5 MG PO TABS: 5.0000 mg | ORAL_TABLET | ORAL | 0 refills | Status: AC | PRN

## 2023-01-22 MED ORDER — BANATROL TF EN LIQD: 60.0000 mL | Freq: Two times a day (BID) | ENTERAL | Status: AC

## 2023-01-22 MED ORDER — FAMOTIDINE 20 MG PO TABS: 20.0000 mg | ORAL_TABLET | Freq: Every day | ORAL | Status: AC

## 2023-01-22 MED ORDER — ACETAMINOPHEN 160 MG/5ML PO SOLN: 650.0000 mg | Freq: Four times a day (QID) | ORAL | 0 refills | Status: AC | PRN

## 2023-01-22 MED ORDER — LEVETIRACETAM 100 MG/ML PO SOLN: 1500.0000 mg | Freq: Two times a day (BID) | ORAL | 12 refills | Status: AC

## 2023-01-22 MED ORDER — VALPROIC ACID 250 MG/5ML PO SOLN: 500.0000 mg | Freq: Three times a day (TID) | ORAL | Status: AC

## 2023-01-22 MED ORDER — MEDIHONEY WOUND/BURN DRESSING EX PSTE: 1.0000 | PASTE | Freq: Every day | CUTANEOUS | Status: AC

## 2023-01-22 MED ORDER — GERHARDT'S BUTT CREAM: 1.0000 | TOPICAL_CREAM | CUTANEOUS | Status: AC | PRN

## 2023-01-22 MED ORDER — GUAIFENESIN 200 MG PO TABS: 400.0000 mg | ORAL_TABLET | Freq: Three times a day (TID) | ORAL | 0 refills | Status: AC

## 2023-01-22 MED ORDER — GABAPENTIN 250 MG/5ML PO SOLN: 100.0000 mg | Freq: Three times a day (TID) | ORAL | 12 refills | Status: AC

## 2023-01-22 MED ORDER — ATORVASTATIN CALCIUM 10 MG PO TABS: 10.0000 mg | ORAL_TABLET | Freq: Every day | ORAL | 0 refills | Status: AC

## 2023-01-22 MED ORDER — LIP MEDEX EX OINT: TOPICAL_OINTMENT | CUTANEOUS | 0 refills | Status: AC | PRN

## 2023-01-22 NOTE — Discharge Summary (Signed)
Physician Discharge Summary  Damon [name] ZOX:[id number] DOB: 30-Jan-1961 DOA: 10/03/2022  PCP: [facility]  Admit date: 10/03/2022 Discharge date: 01/22/2023  Admitted From: Home Discharge disposition: SNF  Recommendations at discharge:  Continue antiepileptics Continue tracheostomy care  Brief narrative: Damon [name] is a 62 y.o. male with PMH significant for hypertension, hyperlipidemia tobacco abuse, chronic back pain. 10/01/2022, patient was diagnosed with flu as an outpatient.  He continued have worsening shortness of breath and hence presented to the ED on 12/20.  Chest x-ray showed bilateral infiltrates. He was started on IV antibiotics, and respiratory support with BiPAP.  Within few, patient went to PEA arrest, resuscitated after roughly about 5 minutes of ACLS. Postcode, he was minimally responsive, no sedation was required for intubation. Admitted to ICU.  He was managed for ARDS due to influenza A and superimposed bacterial pneumonia.  Infection was controlled but his mental status did not improve and several days.  MRI brain 12/26 showed hypoxic/anoxic injury.  Neurology consultation was obtained.  Family was updated about no chance of meaningful recovery and advised for comfort measures.   So far, many providers including Dr. Lindie Spruce has spoken to patient's spouse who has unrealistic expectations and continues to refuse comfort care. 1/18, patient underwent trach and PEG. 2/5 changed to cuffless trach.  He has been approved by insurance for maintenance training at the facility with possible ultimate plan of long-term care at the facility.  Subjective: Patient was seen and examined this morning.  On trach collar.  Not restless or agitated.  No family at bedside.  No essential change in the last several days.  Hospital course: Anoxic brain injury Trach/PEG status No meaningful neurological recovery noted since the initial code on 10/03/22  Acute hypoxic  respiratory failure ARDS due to influenza A  Superimposed pneumococcal and Pseudomonas pneumonia Completed a course of antiviral and antibiotics.   Remains trach dependent.  On oxygenation by tracheostomy collar Pulmonary following on a weekly basis tracheostomy was exchanged on 12/13/2022. Next trach exchange 4/18 Has been experiencing significant secretions requiring suctioning.  Difficult to downsize trach given thick secretions  Continue scopolamine patch. Continue tobramycin nebulizer treatment. No fever in last several days.  WBC count on last check was normal. 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  Continue the same at discharge.   Acute systolic CHF Sinus tachycardia Hypotension Currently heart rate and blood pressure stable on Lopressor 200 mg twice daily, Cardizem 60 mg every 6 hours and midodrine 5 mg 3 times daily Echocardiogram 3/4 with basal inferior/inferolateral and septal hypokinesis EF 50 to 55%  Prediabetes A1c 5.9 on 12/04/2021 Currently on sliding scale insulin with Accu-Cheks while on tube feeding Recent Labs  Lab 01/21/23 0024 01/21/23 0836 01/21/23 1545 01/21/23 1728 01/21/23 2304  GLUCAP 135* 120* 122* 118* 120*   Hyperlipidemia Continue Lipitor  Severe protein caloric malnutrition Continue tube feedings Continue monitor bowel function.  Currently on Banatrol twice daily    Sacral decubitus ulcer stage II Continue wound care.  Goals of care   Code Status: Full Code  IPAL from 11/07/2022, he was DNR but again transitioned back to full code.  Wounds:  - Wound / Incision (Open or Dehisced) 12/17/22 Irritant Dermatitis (Moisture Associated Skin Damage) Perineum Medial reddened crack mid perineal area approximately 1 inch (Active)  Date First Assessed/Time First Assessed: 12/17/22 1400  Wound Type: Irritant  Dermatitis (Moisture Associated Skin Damage)  Location: Perineum  Location Orientation: Medial  Wound Description (Comments): reddened crack mid perineal area approximately ...    Assessments 12/17/2022  2:00 PM 01/22/2023  8:10 AM  Dressing Type Honey Foam - Lift dressing to assess site every shift  Dressing Status -- None  Dressing Change Frequency -- PRN  Site / Wound Assessment Red Painful  Drainage Amount None --  Treatment Cleansed;Other (Comment) Cleansed     No associated orders.    Discharge Exam:   Vitals:   01/22/23 0503 01/22/23 0701 01/22/23 0818 01/22/23 0857  BP: (!) 126/91 96/80  102/77  Pulse:  (!) 17  (!) 107  Resp:  17 16   Temp:      TempSrc:  Oral    SpO2:  93%    Weight:      Height:        Body mass index is 25.73 kg/m.  General exam: Not in distress. Skin: No rashes, lesions or ulcers. HEENT: Atraumatic, normocephalic, no obvious bleeding.  Has trach anteriorly Lungs: Coarse breath sound bilaterally, evident tracheal secretions CVS: No bilateral pedal edema. GI/Abd soft, nontender, nondistended, PEG tube site intact CNS: Tries to open eyes and loud verbal command.  Unable to follow any motor command Psychiatry: Unable to assess because of altered mentation  Follow ups:    Contact information for follow-up providers     [facility].   Contact information: [address] [place] [address] [phone]              Contact information for after-discharge care     Destination     [facility] .   Service: Skilled Nursing Contact information: [address] [place] [address] [phone]                     Discharge Instructions:   Discharge Instructions     Call MD for:  difficulty breathing, headache or visual disturbances   Complete by: As directed    Call MD for:  extreme fatigue   Complete by: As directed    Call MD for:  hives   Complete  by: As directed    Call MD for:  persistant dizziness or light-headedness   Complete by: As directed    Call MD for:  persistant nausea and vomiting   Complete by: As directed    Call MD for:  severe uncontrolled pain   Complete by: As directed    Call MD for:  temperature >100.4   Complete by: As directed    Diet general   Complete by: As directed    Tube feeding   Discharge instructions   Complete by: As directed    Recommendations at discharge:   Continue antiepileptics  Continue tracheostomy care  General discharge instructions: Follow with Primary MD [facility] in 7 days  Please request your PCP  to go over your hospital tests, procedures, radiology results at the follow up. Please get your medicines reviewed and adjusted.  Your PCP may decide to repeat certain labs or tests as needed. Do not drive, operate heavy machinery, perform activities at heights, swimming or participation in water activities or provide baby sitting services if your were admitted for syncope or siezures until you have seen by Primary MD or a Neurologist and advised to do so again. [place] Controlled Substance Reporting System database was reviewed. Do  not drive, operate heavy machinery, perform activities at heights, swim, participate in water activities or provide baby-sitting services while on medications for pain, sleep and mood until your outpatient physician has reevaluated you and advised to do so again.  You are strongly recommended to comply with the dose, frequency and duration of prescribed medications. Activity: As tolerated with Full fall precautions use walker/cane & assistance as needed Avoid using any recreational substances like cigarette, tobacco, alcohol, or non-prescribed drug. If you experience worsening of your admission symptoms, develop shortness of breath, life threatening emergency, suicidal or homicidal thoughts you must seek medical attention immediately by  calling 911 or calling your MD immediately  if symptoms less severe. You must read complete instructions/literature along with all the possible adverse reactions/side effects for all the medicines you take and that have been prescribed to you. Take any new medicine only after you have completely understood and accepted all the possible adverse reactions/side effects.  Wear Seat belts while driving. You were cared for by a hospitalist during your hospital stay. If you have any questions about your discharge medications or the care you received while you were in the hospital after you are discharged, you can call the unit and ask to speak with the hospitalist or the covering physician. Once you are discharged, your primary care physician will handle any further medical issues. Please note that NO REFILLS for any discharge medications will be authorized once you are discharged, as it is imperative that you return to your primary care physician (or establish a relationship with a primary care physician if you do not have one).   Discharge wound care:   Complete by: As directed    Increase activity slowly   Complete by: As directed        Discharge Medications:   Allergies as of 01/22/2023   No Known Allergies      Medication List     STOP taking these medications    acetaminophen 500 MG tablet Commonly known as: TYLENOL Replaced by: acetaminophen 160 MG/5ML solution   amLODipine 5 MG tablet Commonly known as: NORVASC   benzonatate 100 MG capsule Commonly known as: TESSALON   ibuprofen 600 MG tablet Commonly known as: ADVIL   pantoprazole 40 MG tablet Commonly known as: PROTONIX       TAKE these medications    acetaminophen 160 MG/5ML solution Commonly known as: TYLENOL Place 20.3 mLs (650 mg total) into feeding tube every 6 (six) hours as needed for mild pain or fever (Fever >/= 101). Replaces: acetaminophen 500 MG tablet   atorvastatin 10 MG tablet Commonly known as:  LIPITOR Place 1 tablet (10 mg total) into feeding tube daily. What changed: how to take this   Chlorhexidine Gluconate Cloth 2 % Pads Apply 6 each topically daily. Start taking on: January 23, 2023   clonazePAM 0.5 MG tablet Commonly known as: KLONOPIN Place 1 tablet (0.5 mg total) into feeding tube 2 (two) times daily.   diltiazem 60 MG tablet Commonly known as: CARDIZEM Place 1 tablet (60 mg total) into feeding tube every 6 (six) hours.   famotidine 20 MG tablet Commonly known as: PEPCID Place 1 tablet (20 mg total) into feeding tube at bedtime.   feeding supplement (OSMOLITE 1.5 CAL) Liqd Place 1,000 mLs into feeding tube continuous.   feeding supplement (PROSource TF20) liquid Place 60 mLs into feeding tube 2 (two) times daily.   fiber supplement (BANATROL TF) liquid Place 60 mLs into feeding tube 2 (two)  times daily.   free water Soln Place 200 mLs into feeding tube every 8 (eight) hours.   gabapentin 250 MG/5ML solution Commonly known as: NEURONTIN Place 2 mLs (100 mg total) into feeding tube every 8 (eight) hours.   Gerhardt's butt cream Crea Apply 1 Application topically as needed for irritation.   guaiFENesin 200 MG tablet Place 2 tablets (400 mg total) into feeding tube every 8 (eight) hours.   ipratropium-albuterol 0.5-2.5 (3) MG/3ML Soln Commonly known as: DUONEB Take 3 mLs by nebulization every 6 (six) hours as needed.   leptospermum manuka honey Pste paste Apply 1 Application topically at bedtime.   levETIRAcetam 100 MG/ML solution Commonly known as: KEPPRA Place 15 mLs (1,500 mg total) into feeding tube 2 (two) times daily.   lip balm ointment Apply topically as needed for lip care.   metoprolol tartrate 100 MG tablet Commonly known as: LOPRESSOR Place 2 tablets (200 mg total) into feeding tube 2 (two) times daily.   midodrine 5 MG tablet Commonly known as: PROAMATINE Place 1 tablet (5 mg total) into feeding tube every 8 (eight) hours.    ondansetron 4 MG tablet Commonly known as: ZOFRAN Place 1 tablet (4 mg total) into feeding tube every 6 (six) hours as needed for nausea. What changed:  how to take this when to take this reasons to take this   oxyCODONE 5 MG immediate release tablet Commonly known as: Oxy IR/ROXICODONE Place 1 tablet (5 mg total) into feeding tube every 4 (four) hours as needed for severe pain, moderate pain or breakthrough pain.   scopolamine 1 MG/3DAYS Commonly known as: TRANSDERM-SCOP Place 1 patch (1.5 mg total) onto the skin every 3 (three) days. Start taking on: January 24, 2023   valproic acid 250 MG/5ML solution Commonly known as: DEPAKENE Place 10 mLs (500 mg total) into feeding tube every 8 (eight) hours.               Discharge Care Instructions  (From admission, onward)           Start     Ordered   01/22/23 0000  Discharge wound care:        01/22/23 6213             The results of significant diagnostics from this hospitalization (including imaging, microbiology, ancillary and laboratory) are listed below for reference.    Procedures and Diagnostic Studies:   MR BRAIN WO CONTRAST  Result Date: 10/09/2022 CLINICAL DATA:  Provided history: Anoxic brain damage. EXAM: MRI HEAD WITHOUT CONTRAST TECHNIQUE: Multiplanar, multiecho pulse sequences of the brain and surrounding structures were obtained without intravenous contrast. COMPARISON:  No pertinent prior exams available for comparison. FINDINGS: Intermittently motion degraded examination, limiting evaluation. Most notably, the coronal T2-weighted sequence is severely motion degraded. Brain: There is fairly symmetric diffusion-weighted and T2 FLAIR hyperintense signal abnormality within the bilateral basal ganglia. Subtle diffusion-weighted signal abnormality is also suspected within the bilateral hippocampi. Given the provided history, these findings are compatible with acute hypoxic/ischemic injury. Punctate chronic  microhemorrhage within the left corona radiata (series 10, image 35). No evidence of an intracranial mass. No extra-axial fluid collection. No midline shift. Vascular: Maintained flow voids within the proximal large arterial vessels. Developmental venous anomaly within the right cerebellar hemisphere (anise of variant). Skull and upper cervical spine: No focal suspicious marrow lesion. Incompletely assessed cervical spondylosis. Sinuses/Orbits: No mass or acute finding within the imaged orbits. Minimal mucosal thickening within the right maxillary sinus. Small fluid level,  and minimal background mucosal thickening, within the left maxillary sinus. Small fluid level, and mild background mucosal thickening, within the right sphenoid sinus. Moderate mucosal thickening within the left sphenoid sinus. Mild mucosal thickening within the bilateral ethmoid and left frontal sinuses. Other: Bilateral mastoid effusions. Impression #2 will be called to the ordering clinician or representative by the Radiologist Assistant, and communication documented in the PACS or [employer]. IMPRESSION: 1. Intermittently motion degraded examination. 2. Fairly symmetric diffusion-weighted and T2 FLAIR hyperintense signal abnormality within the bilateral basal ganglia. Subtle signal changes are also suspected within the bilateral hippocampi. Given the provided history, these findings are compatible with acute hypoxic/ischemic injury. 3. Paranasal sinus disease and bilateral mastoid effusions, as described. Electronically Signed   By: Jackey Loge D.O.   On: 10/09/2022 14:45   Rapid EEG  Result Date: 10/08/2022 Charlsie Quest, MD     10/10/2022  8:17 AM Patient Name: Damon [name] MRN: [id number] Epilepsy Attending: Charlsie Quest Referring Physician/Provider: Milon Dikes, MD Date: 10/08/2022 Duration: 30.43 mins Patient history: 62yo M with twitching s/p cardiac arrest. EEG to evaluate for seizure Level of alertness: comatose  AEDs during EEG study: None Technical aspects: This EEG was obtained using a 10 lead EEG system positioned circumferentially without any parasagittal coverage (rapid EEG). Computer selected EEG is reviewed as  well as background features and all clinically significant events. Description: EEG showed generalized periodic discharges (GPDS) with fluctuating frequency of 1-3Hz  In between GPDS, EEG showed generalized background suppression. Hyperventilation and photic stimulation were not performed.   ABNORMALITY - Periodic discharges, generalized ( GPDs) - Background suppression, generalized IMPRESSION: This study showed evidence of epileptogenicity with generalized onset. The GPDs at times reach 3hz  in frequency which makes this concerning for ictal nature. Video is not available. Therefore, please correlate clinically for seizures. Additionally there was evidence of profound diffuse encephalopathy.  In the setting of cardiac arrest, this EEG pattern is concerning for anoxic/hypoxic brain injury. Priyanka [name]     Labs:   Basic Metabolic Panel: Recent Labs  Lab 01/16/23 0453  NA 136  K 4.5  CL 101  CO2 24  GLUCOSE 95  BUN 39*  CREATININE 0.73  CALCIUM 9.5   GFR Estimated Creatinine Clearance: 82.2 mL/min (by C-G formula based on SCr of 0.73 mg/dL). Liver Function Tests: No results for input(s): "AST", "ALT", "ALKPHOS", "BILITOT", "PROT", "ALBUMIN" in the last 168 hours. No results for input(s): "LIPASE", "AMYLASE" in the last 168 hours. No results for input(s): "AMMONIA" in the last 168 hours. Coagulation profile No results for input(s): "INR", "PROTIME" in the last 168 hours.  CBC: Recent Labs  Lab 01/16/23 0453  WBC 9.2  HGB 12.7*  HCT 41.2  MCV 90.4  PLT 310   Cardiac Enzymes: No results for input(s): "CKTOTAL", "CKMB", "CKMBINDEX", "TROPONINI" in the last 168 hours. BNP: Invalid input(s): "POCBNP" CBG: Recent Labs  Lab 01/21/23 0024 01/21/23 0836 01/21/23 1545  01/21/23 1728 01/21/23 2304  GLUCAP 135* 120* 122* 118* 120*   D-Dimer No results for input(s): "DDIMER" in the last 72 hours. Hgb A1c No results for input(s): "HGBA1C" in the last 72 hours. Lipid Profile No results for input(s): "CHOL", "HDL", "LDLCALC", "TRIG", "CHOLHDL", "LDLDIRECT" in the last 72 hours. Thyroid function studies No results for input(s): "TSH", "T4TOTAL", "T3FREE", "THYROIDAB" in the last 72 hours.  Invalid input(s): "FREET3" Anemia work up No results for input(s): "VITAMINB12", "FOLATE", "FERRITIN", "TIBC", "IRON", "RETICCTPCT" in the last 72 hours. Microbiology No results found  for this or any previous visit (from the past 240 hour(s)).  Time coordinating discharge: 45 minutes  Signed: Mertie Haslem  [facility] 01/22/2023, 11:20 AM

## 2023-01-22 NOTE — Progress Notes (Signed)
Pt discharge education and and care plan completed. Pt discharged to [facility] and report called off to nurse Revonda Standard at the facility. Pt telemetry and IV removed. Pt awaiting on PTAR to transport to disposition. Pt provided with extra trach cannula and trach cuffless 6 mm shilley as requested by facility. Pt emergency trach obturator at bedside included to pt's belongings. Pt PEG flushed and clamped. Will continue to closely monitor pt till pick up by PTAR. Dionne Bucy RN

## 2023-01-22 NOTE — Care Management Important Message (Signed)
Important Message  Patient Details  Name: Damon [name] MRN: [id number] Date of Birth: Jul 10, 1961   Medicare Important Message Given:  Yes     Sherilyn Banker 01/22/2023, 1:20 PM

## 2023-01-22 NOTE — Progress Notes (Signed)
Pt picked up by PTAR to be transported off to disposition. Pt deep suctioned prior to transport and condom catheter drainage bag emptied with 200 ml clear, yellow urine. Pt transported off unit via stretcher with belongings to the side. Pt discharged to [facility]. Dionne Bucy RN

## 2023-01-22 NOTE — Progress Notes (Addendum)
2:35pm: CSW spoke with patient's wife [name] to update her of discharge plan.  8:30am: Patient can be discharged to [facility] today at 2pm - PTAR has already been arranged. The number for call for report is [phone].  Discharge packet completed and left with unit secretary.  Edwin Dada, MSW, LCSW Transitions of Care  Clinical Social Worker II [phone]

## 2023-01-22 NOTE — Progress Notes (Signed)
Nutrition Follow-up  DOCUMENTATION CODES:   Non-severe (moderate) malnutrition in context of acute illness/injury  INTERVENTION:   Continue tube feeds via PEG: - Osmolite 1.5 @ 65 ml/hr (1560 ml/day) - PROSource TF20 60 ml BID - Free water flushes of 200 ml q 8 hours  Tube feeding regimen and current free water flushes provide 2500 kcal, 138 grams of protein, and 1789 ml of H2O.   - Continue Banatrol TF BID per tube, each packet provides 5 grams of soluble fiber   NUTRITION DIAGNOSIS:   Moderate Malnutrition related to acute illness as evidenced by mild fat depletion, moderate fat depletion, severe fat depletion, severe muscle depletion, percent weight loss.  Ongoing, being addressed via TF  GOAL:   Patient will meet greater than or equal to 90% of their needs  Met via TF  MONITOR:   Labs, Weight trends, TF tolerance, Skin, I & O's  REASON FOR ASSESSMENT:   Consult Enteral/tube feeding initiation and management  ASSESSMENT:   Pt admitted from home with the flu leading to acute respiratory failure with hypoxia and PEA arrest upon admission. PMH significant for HTN, HLD, chronic back pain, tobacco use.  Pt remains NPO with tube feeds infusing via PEG. Weight up slightly compared to weight at last RD follow-up. Noted plan for pt to discharge today to [facility].  Admit weight: 64.9 kg Current weight: 68 kg  Current TF: Osmolite 1.5 @ 65 ml/hr, PROSource TF20 60 ml BID, free water flushes 200 ml q 8 hours  Medications reviewed and include: pepcid, banatrol TF BID, scopolamine patch  Labs reviewed. CBG's: 118-135 x 24 hours  UOP: 500 ml x 24 hours  Diet Order:   Diet Order     None       EDUCATION NEEDS:   No education needs have been identified at this time  Skin:  Skin Assessment: Skin Integrity Issues: Other: crack to perineal area  Last BM:  01/22/23 smear type 6  Height:   Ht Readings from Last 1 Encounters:  10/11/22 5\' 4"  (1.626 m)     Weight:   Wt Readings from Last 1 Encounters:  01/22/23 68 kg    BMI:  Body mass index is 25.73 kg/m.  Estimated Nutritional Needs:   Kcal:  2500-2700 kcals  Protein:  125-150 grams  Fluid:  >/= 2 L/day    Mertie Clause, MS, RD, LDN Inpatient Clinical Dietitian Please see AMiON for contact information.
